# Patient Record
Sex: Male | Born: 1970 | Race: White | Hispanic: No | Marital: Married | Smoking: Former smoker
Health system: Southern US, Community
[De-identification: ages and names within clinical notes are randomized; demographics above are authoritative.]

## PROBLEM LIST (undated history)

## (undated) DIAGNOSIS — IMO0002 Reserved for concepts with insufficient information to code with codable children: Secondary | ICD-10-CM

## (undated) DIAGNOSIS — I5022 Chronic systolic (congestive) heart failure: Secondary | ICD-10-CM

## (undated) DIAGNOSIS — I10 Essential (primary) hypertension: Secondary | ICD-10-CM

## (undated) DIAGNOSIS — E118 Type 2 diabetes mellitus with unspecified complications: Secondary | ICD-10-CM

## (undated) DIAGNOSIS — I639 Cerebral infarction, unspecified: Secondary | ICD-10-CM

## (undated) DIAGNOSIS — M1990 Unspecified osteoarthritis, unspecified site: Secondary | ICD-10-CM

## (undated) DIAGNOSIS — I2510 Atherosclerotic heart disease of native coronary artery without angina pectoris: Secondary | ICD-10-CM

## (undated) DIAGNOSIS — Z951 Presence of aortocoronary bypass graft: Secondary | ICD-10-CM

## (undated) DIAGNOSIS — I255 Ischemic cardiomyopathy: Secondary | ICD-10-CM

## (undated) DIAGNOSIS — H5340 Unspecified visual field defects: Secondary | ICD-10-CM

## (undated) DIAGNOSIS — E1165 Type 2 diabetes mellitus with hyperglycemia: Secondary | ICD-10-CM

## (undated) DIAGNOSIS — I420 Dilated cardiomyopathy: Secondary | ICD-10-CM

## (undated) HISTORY — PX: CHOLECYSTECTOMY: SHX55

## (undated) HISTORY — DX: Chronic systolic (congestive) heart failure: I50.22

## (undated) HISTORY — DX: Unspecified osteoarthritis, unspecified site: M19.90

---

## 2003-12-17 ENCOUNTER — Ambulatory Visit (HOSPITAL_COMMUNITY): Admission: RE | Admit: 2003-12-17 | Discharge: 2003-12-17 | Payer: Self-pay | Admitting: Orthopedic Surgery

## 2013-01-08 DIAGNOSIS — I2510 Atherosclerotic heart disease of native coronary artery without angina pectoris: Secondary | ICD-10-CM

## 2013-01-08 HISTORY — PX: CORONARY ANGIOPLASTY: SHX604

## 2013-01-08 HISTORY — DX: Atherosclerotic heart disease of native coronary artery without angina pectoris: I25.10

## 2013-01-08 HISTORY — PX: CARDIAC CATHETERIZATION: SHX172

## 2013-04-03 DIAGNOSIS — I509 Heart failure, unspecified: Secondary | ICD-10-CM

## 2013-04-07 DIAGNOSIS — R079 Chest pain, unspecified: Secondary | ICD-10-CM

## 2014-08-25 ENCOUNTER — Telehealth: Payer: Self-pay | Admitting: Family Medicine

## 2014-08-25 NOTE — Telephone Encounter (Signed)
[facility]told to arrive 15 minutes early and bring all meds, has [employer] insurance

## 2014-10-10 ENCOUNTER — Encounter (HOSPITAL_COMMUNITY): Payer: Self-pay | Admitting: Cardiology

## 2014-10-10 ENCOUNTER — Inpatient Hospital Stay (HOSPITAL_COMMUNITY)
Admission: EM | Admit: 2014-10-10 | Discharge: 2014-10-26 | DRG: 233 | Disposition: A | Discharge status: Home / Self Care | Payer: 59 | Attending: Thoracic Surgery (Cardiothoracic Vascular Surgery) | Admitting: Thoracic Surgery (Cardiothoracic Vascular Surgery)

## 2014-10-10 ENCOUNTER — Encounter (HOSPITAL_COMMUNITY)
Admission: EM | Disposition: A | Discharge status: Home / Self Care | Payer: Self-pay | Source: Home / Self Care | Attending: Thoracic Surgery (Cardiothoracic Vascular Surgery)

## 2014-10-10 DIAGNOSIS — H5340 Unspecified visual field defects: Secondary | ICD-10-CM | POA: Diagnosis present

## 2014-10-10 DIAGNOSIS — R079 Chest pain, unspecified: Secondary | ICD-10-CM | POA: Diagnosis present

## 2014-10-10 DIAGNOSIS — Z87891 Personal history of nicotine dependence: Secondary | ICD-10-CM | POA: Diagnosis not present

## 2014-10-10 DIAGNOSIS — I10 Essential (primary) hypertension: Secondary | ICD-10-CM | POA: Diagnosis present

## 2014-10-10 DIAGNOSIS — N179 Acute kidney failure, unspecified: Secondary | ICD-10-CM | POA: Diagnosis not present

## 2014-10-10 DIAGNOSIS — K045 Chronic apical periodontitis: Secondary | ICD-10-CM | POA: Diagnosis present

## 2014-10-10 DIAGNOSIS — E1165 Type 2 diabetes mellitus with hyperglycemia: Secondary | ICD-10-CM | POA: Diagnosis present

## 2014-10-10 DIAGNOSIS — I493 Ventricular premature depolarization: Secondary | ICD-10-CM | POA: Diagnosis not present

## 2014-10-10 DIAGNOSIS — D62 Acute posthemorrhagic anemia: Secondary | ICD-10-CM | POA: Diagnosis not present

## 2014-10-10 DIAGNOSIS — D72829 Elevated white blood cell count, unspecified: Secondary | ICD-10-CM | POA: Diagnosis present

## 2014-10-10 DIAGNOSIS — F4323 Adjustment disorder with mixed anxiety and depressed mood: Secondary | ICD-10-CM

## 2014-10-10 DIAGNOSIS — M1990 Unspecified osteoarthritis, unspecified site: Secondary | ICD-10-CM | POA: Diagnosis present

## 2014-10-10 DIAGNOSIS — I639 Cerebral infarction, unspecified: Secondary | ICD-10-CM

## 2014-10-10 DIAGNOSIS — I252 Old myocardial infarction: Secondary | ICD-10-CM | POA: Diagnosis not present

## 2014-10-10 DIAGNOSIS — I4891 Unspecified atrial fibrillation: Secondary | ICD-10-CM | POA: Diagnosis not present

## 2014-10-10 DIAGNOSIS — Z7982 Long term (current) use of aspirin: Secondary | ICD-10-CM

## 2014-10-10 DIAGNOSIS — I255 Ischemic cardiomyopathy: Secondary | ICD-10-CM | POA: Diagnosis present

## 2014-10-10 DIAGNOSIS — Z794 Long term (current) use of insulin: Secondary | ICD-10-CM

## 2014-10-10 DIAGNOSIS — E785 Hyperlipidemia, unspecified: Secondary | ICD-10-CM | POA: Diagnosis present

## 2014-10-10 DIAGNOSIS — I4510 Unspecified right bundle-branch block: Secondary | ICD-10-CM | POA: Diagnosis present

## 2014-10-10 DIAGNOSIS — K029 Dental caries, unspecified: Secondary | ICD-10-CM | POA: Diagnosis present

## 2014-10-10 DIAGNOSIS — I2109 ST elevation (STEMI) myocardial infarction involving other coronary artery of anterior wall: Principal | ICD-10-CM | POA: Diagnosis present

## 2014-10-10 DIAGNOSIS — Z609 Problem related to social environment, unspecified: Secondary | ICD-10-CM | POA: Diagnosis present

## 2014-10-10 DIAGNOSIS — K219 Gastro-esophageal reflux disease without esophagitis: Secondary | ICD-10-CM | POA: Diagnosis present

## 2014-10-10 DIAGNOSIS — I951 Orthostatic hypotension: Secondary | ICD-10-CM | POA: Diagnosis not present

## 2014-10-10 DIAGNOSIS — F329 Major depressive disorder, single episode, unspecified: Secondary | ICD-10-CM | POA: Insufficient documentation

## 2014-10-10 DIAGNOSIS — I420 Dilated cardiomyopathy: Secondary | ICD-10-CM | POA: Diagnosis present

## 2014-10-10 DIAGNOSIS — I2510 Atherosclerotic heart disease of native coronary artery without angina pectoris: Secondary | ICD-10-CM | POA: Diagnosis present

## 2014-10-10 DIAGNOSIS — Z6841 Body Mass Index (BMI) 40.0 and over, adult: Secondary | ICD-10-CM | POA: Diagnosis not present

## 2014-10-10 DIAGNOSIS — I5043 Acute on chronic combined systolic (congestive) and diastolic (congestive) heart failure: Secondary | ICD-10-CM | POA: Diagnosis present

## 2014-10-10 DIAGNOSIS — K0530 Chronic periodontitis, unspecified: Secondary | ICD-10-CM | POA: Diagnosis present

## 2014-10-10 DIAGNOSIS — K083 Retained dental root: Secondary | ICD-10-CM | POA: Diagnosis present

## 2014-10-10 DIAGNOSIS — K011 Impacted teeth: Secondary | ICD-10-CM | POA: Diagnosis present

## 2014-10-10 DIAGNOSIS — E118 Type 2 diabetes mellitus with unspecified complications: Secondary | ICD-10-CM

## 2014-10-10 DIAGNOSIS — IMO0002 Reserved for concepts with insufficient information to code with codable children: Secondary | ICD-10-CM | POA: Diagnosis present

## 2014-10-10 DIAGNOSIS — I2582 Chronic total occlusion of coronary artery: Secondary | ICD-10-CM | POA: Diagnosis present

## 2014-10-10 DIAGNOSIS — I509 Heart failure, unspecified: Secondary | ICD-10-CM

## 2014-10-10 DIAGNOSIS — E1159 Type 2 diabetes mellitus with other circulatory complications: Secondary | ICD-10-CM | POA: Diagnosis present

## 2014-10-10 DIAGNOSIS — Z8673 Personal history of transient ischemic attack (TIA), and cerebral infarction without residual deficits: Secondary | ICD-10-CM | POA: Diagnosis not present

## 2014-10-10 DIAGNOSIS — I152 Hypertension secondary to endocrine disorders: Secondary | ICD-10-CM | POA: Diagnosis present

## 2014-10-10 DIAGNOSIS — I213 ST elevation (STEMI) myocardial infarction of unspecified site: Secondary | ICD-10-CM

## 2014-10-10 DIAGNOSIS — I2102 ST elevation (STEMI) myocardial infarction involving left anterior descending coronary artery: Secondary | ICD-10-CM | POA: Diagnosis present

## 2014-10-10 DIAGNOSIS — J9811 Atelectasis: Secondary | ICD-10-CM

## 2014-10-10 DIAGNOSIS — F4321 Adjustment disorder with depressed mood: Secondary | ICD-10-CM | POA: Diagnosis present

## 2014-10-10 DIAGNOSIS — Z9289 Personal history of other medical treatment: Secondary | ICD-10-CM

## 2014-10-10 DIAGNOSIS — I472 Ventricular tachycardia: Secondary | ICD-10-CM | POA: Diagnosis not present

## 2014-10-10 DIAGNOSIS — Z951 Presence of aortocoronary bypass graft: Secondary | ICD-10-CM

## 2014-10-10 HISTORY — DX: Presence of aortocoronary bypass graft: Z95.1

## 2014-10-10 HISTORY — DX: Essential (primary) hypertension: I10

## 2014-10-10 HISTORY — DX: Cerebral infarction, unspecified: I63.9

## 2014-10-10 HISTORY — PX: LEFT HEART CATH: SHX5478

## 2014-10-10 HISTORY — DX: Atherosclerotic heart disease of native coronary artery without angina pectoris: I25.10

## 2014-10-10 HISTORY — DX: Reserved for concepts with insufficient information to code with codable children: IMO0002

## 2014-10-10 HISTORY — DX: Ischemic cardiomyopathy: I25.5

## 2014-10-10 HISTORY — DX: Dilated cardiomyopathy: I42.0

## 2014-10-10 HISTORY — DX: Type 2 diabetes mellitus with unspecified complications: E11.8

## 2014-10-10 HISTORY — DX: Morbid (severe) obesity due to excess calories: E66.01

## 2014-10-10 HISTORY — DX: Type 2 diabetes mellitus with hyperglycemia: E11.65

## 2014-10-10 HISTORY — DX: Unspecified visual field defects: H53.40

## 2014-10-10 SURGERY — LEFT HEART CATH
Anesthesia: LOCAL

## 2014-10-10 MED ORDER — HEPARIN (PORCINE) IN NACL 2-0.9 UNIT/ML-% IJ SOLN
INTRAMUSCULAR | Status: AC
  Filled 2014-10-10: qty 1000

## 2014-10-10 MED ORDER — LIDOCAINE HCL (PF) 1 % IJ SOLN
INTRAMUSCULAR | Status: AC
  Filled 2014-10-10: qty 30

## 2014-10-10 MED ORDER — TIROFIBAN HCL IV 12.5 MG/250 ML
INTRAVENOUS | Status: AC
  Filled 2014-10-10: qty 250

## 2014-10-10 MED ORDER — NITROGLYCERIN 1 MG/10 ML FOR IR/CATH LAB
INTRA_ARTERIAL | Status: AC
  Filled 2014-10-10: qty 10

## 2014-10-10 MED ORDER — NITROGLYCERIN IN D5W 200-5 MCG/ML-% IV SOLN
INTRAVENOUS | Status: AC
  Filled 2014-10-10: qty 250

## 2014-10-10 MED ORDER — FENTANYL CITRATE 0.05 MG/ML IJ SOLN
INTRAMUSCULAR | Status: AC
  Filled 2014-10-10: qty 2

## 2014-10-10 MED ORDER — INSULIN ASPART 100 UNIT/ML ~~LOC~~ SOLN
0.0000 [IU] | Freq: Three times a day (TID) | SUBCUTANEOUS | Status: DC
  Administered 2014-10-11: 4 [IU] via SUBCUTANEOUS
  Administered 2014-10-11: 11 [IU] via SUBCUTANEOUS
  Administered 2014-10-11: 7 [IU] via SUBCUTANEOUS
  Administered 2014-10-12: 11 [IU] via SUBCUTANEOUS
  Administered 2014-10-12: 4 [IU] via SUBCUTANEOUS
  Administered 2014-10-12: 7 [IU] via SUBCUTANEOUS
  Filled 2014-10-10: qty 0.2

## 2014-10-10 MED ORDER — MIDAZOLAM HCL 2 MG/2ML IJ SOLN
INTRAMUSCULAR | Status: AC
  Filled 2014-10-10: qty 2

## 2014-10-10 NOTE — H&P (Addendum)
Admit date: 10/10/2014 Referring Physician: Dr. Rachel Bo Primary Cardiologist: [facility] - has not seen anyone since MI 4/14 Chief complaint/reason for admission:Chest pain  HPI: This is a 44yo morbidly obese WM with a history of AWMI 4/14 with cath a [facility] showing 95% prox LAD, 90% D1, 30% OM1, 70% mid RCA, 60% distal RCA and EF 40%.  He has not seen a Cardiologist since then.  He has a history of Type II DM as well.  He is not on a statin.  He was in his USOH until 3 days ago when he started having severe chest burning but it would only occur at night and mainly when lying down and he would get very SOB.  He also was having some problems with cough.  He denies any fever but had some subjective chills.  He tried some alka seltzer with minimal relief.  He says that he is unable to lay down to go to sleep due to the burning sensation.  He just finished antibiotics for a sinus infection.  He presented to [facility] ER today after finishing dinner when he had reoccurence of the chest discomfort.  He has a history of GERD.  He says that his symptoms are different from what he can recall from his prior MI.  In ER he was noted to have an old anterior MI but new ST elevation in V1 and V2 more pronounced than the minimal ST elevation he had on prior EKG.  A code STEMI was called and he now presents to [facility] cath lab with 3/10 chest discomfort.    PMH:    Past Medical History  Diagnosis Date  . Coronary artery disease 01/2013    anterior MI with cath showing 95% pLAD, 90% diag, 30% OM1, 70% mRCA, 60% dRCA s/p PCI with DES to LAD and diag and EF 40%  . Ischemic dilated cardiomyopathy   . Hypertension   . Morbid obesity   . Diabetes mellitus without complication     PSH:    Past Surgical History  Procedure Laterality Date  . Cardiac catheterization    . Coronary angioplasty    . Cholecystectomy      ALLERGIES:   Review of patient's allergies indicates not on file.  Prior to Admit Meds:   Prescriptions prior to  admission  Medication Sig Dispense Refill Last Dose  . aspirin 81 MG tablet Take 81 mg by mouth daily.     Marland Kitchen lisinopril-hydrochlorothiazide (PRINZIDE,ZESTORETIC) 10-12.5 MG per tablet Take 1 tablet by mouth daily.     . metFORMIN (GLUCOPHAGE) 500 MG tablet Take 500 mg by mouth 2 (two) times daily with a meal.      Family HX:   No family history on file. Social HX:    History   Social History  . Marital Status: Married    Spouse Name: N/A    Number of Children: N/A  . Years of Education: N/A   Occupational History  . Not on file.   Social History Main Topics  . Smoking status: Not on file  . Smokeless tobacco: Not on file  . Alcohol Use: Not on file  . Drug Use: Not on file  . Sexual Activity: Not on file   Other Topics Concern  . Not on file   Social History Narrative  . No narrative on file     ROS:  All 11 ROS were addressed and are negative except what is stated in the HPI  PHYSICAL EXAM There were  no vitals filed for this visit. General: Well developed, well nourished, in no acute distress Head: Eyes PERRLA, No xanthomas.   Normal cephalic and atramatic  Lungs:   Clear bilaterally to auscultation and percussion. Heart:   HRRR S1 S2 Pulses are 2+ & equal.            No carotid bruit. No JVD.  No abdominal bruits. No femoral bruits. Abdomen: Bowel sounds are positive, abdomen soft and non-tender without masses Extremities:   No clubbing, cyanosis or edema.  DP +1 Neuro: Alert and oriented X 3. Psych:  Good affect, responds appropriately   Labs:  No results found for: WBC, HGB, HCT, MCV, PLT No results for input(s): NA, K, CL, CO2, BUN, CREATININE, CALCIUM, PROT, BILITOT, ALKPHOS, ALT, AST, GLUCOSE in the last 168 hours.  Invalid input(s): LABALBU No results found for: CKTOTAL, CKMB, CKMBINDEX, TROPONINI No results found for: PTT No results found for: INR, PROTIME  No results found for: CHOL No results found for: HDL No results found for: LDLCALC No results  found for: TRIG No results found for: CHOLHDL No results found for: LDLDIRECT    Radiology:  No results found.  EKG:  NSR with anterior infarct and 2mm of ST elevation in V1 and V2  ASSESSMENT:  1.  Anterior STEMI with 2mm of ST segment elevation in V1 and V2.  CP symptoms are somewhat atypical but pain has been going on for 3 days and currently having pain with new EKG changes.  Troponin elevated at 3.5. 2.  ASCAD with remote anterior MI a year ago with cath at that time showing 95% pLAD, 90% D1, 30% OM1, 70% mRCA and 60% dRCA with EF 40% s/p PCI of LAD and diag with DES. 3.  Morbid obesity 4.  Type II DM  PLAN:   1.  Admit to CCU 2.  Cycle cardiac enzymes 3.  IV Heparin gtt 4.  Hold Metformin 5.  SS Insulin coverage 6.  Check FLP in am and LFTs 7.  Start high dose statin therapy Lipitor  daily 8.  Continue ASA 9.  Continue Lisinopril at  daily and hold HCTZ 10.  Start Lopressor 12.5mg  BID and titrate as BP allows  Quintella Reichert, MD  10/10/2014  11:22 PM

## 2014-10-10 NOTE — CV Procedure (Signed)
Mark Leach is a 44 y.o. male    [id number]  [id number] LOCATION:  FACILITY: [facility]  PHYSICIAN: Lennette Bihari, MD, [facility] 1971/07/29   DATE OF PROCEDURE:  10/10/2014    EMERGENT CARDIAC CATHETERIZATION     HISTORY:    Mark Leach is a 44 y.o. male who has a history of prior anterior wall myocardial infarction and had undergone stenting to his LAD diagonal system at [facility] in April 2014.  He has not had Cardiologic follow-up.  He presented to [facility] hospital this evening after 3 days of progressive chest burning and inability to lie flat with dyspnea.  His ECG showed Q waves anteriorly, but had more progressive ST elevation.  A code STEMI was called and he was transported to [facility] for urgent cardiac catheterization.   PROCEDURE: Left heart catheterization: coronary angiography, left ventriculography   The patient was brought to the [facility] cardiac catherization laboratory in transfer from [facility]. Upon arrival to the catheterization laboratory he had residual 3 had a 10 chest burning.  His right groin was prepped and draped in sterile fashion.  His right femoral artery was punctured without difficulty with 1 anterior stick.  A 6 French arterial sheath was inserted.  Diagnostic catheterization was done with 5 French Judkins 5 left and JR4 right coronary catheters.  A 5 French pigtail catheter was used for left ventriculography.  In light of the patient's severe coronary anatomy, Aggrastat was started.  At completion of the procedure, the patient's chest burning had completely resolved.  Hemostasis  was obtained by direct manual pressure.  HEMODYNAMICS:   Central Aorta: 110/70   Left Ventricle: 110/12/22  ANGIOGRAPHY:  Left main: Moderate size vessel which trifurcated into an LAD and intermediate vessel and left circumflex coronary artery.  LAD: The LAD was subtotally occluded at its ostium and there was diffuse 95-99% ostial proximal stenosis and  then was totally occluded in the region of the first diagonal branch.  There was a gap and then  faint filling of a second diagonal branch which had a stent and there was faint filling of the LAD beyond the diagonal vessel via collaterals.  Ramus Intermediate: Small caliber vessel free of significant disease.  Left circumflex: Large caliber vessel that gave rise to 2 major marginal branches and then in the posterior lateral like coronary artery.  The first marginal branch was moderate size and had proximal 90% followed by 80% stenoses.  A distal superior branch had 95% stenosis.  Right coronary artery: Moderate size vessel that had 95% mid stenosis and 80% distal stenosis in the region of the acute margin.  The vessel supplied the PDA.  There were septal collaterals to the LAD.   Left ventriculography revealed dilated ventricle with an ejection fraction of 25% with diffuse global hypokinesis   IMPRESSION:  Severe ischemic dilated cardiomyopathy with an ejection fraction approximately 25%.  Severe multivessel coronary obstructive disease with diffusely diseased subtotal occlusion of the ostial and proximal LAD with faint collaterals to the first and second diagonal vessel and more distal LAD; left circumflex coronary artery with tandem 90 and 80% obtuse marginal 1 stenosis with distal 95% stenosis in this distal superior branch of this marginal vessel; and 95% mid RCA stenosis with 80% stenosis in the region of the acute margin with evidence for septal collaterals from the PDA to the LAD.  RECOMMENDATION:  The patient has surgical anatomy and CABG revascularization surgery will be recommended with surgical consultation in the morning.  The patient was started on Aggrastat post procedure to reduce likelihood of progressive thrombosis.  An attempt was made to initiate low-dose IV nitroglycerin, but the patient transiently dropped his blood pressure and this was discontinued.    Lennette Bihari,  MD, [facility] 10/10/2014 11:43 PM

## 2014-10-11 ENCOUNTER — Encounter (HOSPITAL_COMMUNITY): Payer: Self-pay | Admitting: *Deleted

## 2014-10-11 DIAGNOSIS — I471 Supraventricular tachycardia: Secondary | ICD-10-CM

## 2014-10-11 DIAGNOSIS — I2102 ST elevation (STEMI) myocardial infarction involving left anterior descending coronary artery: Secondary | ICD-10-CM

## 2014-10-11 DIAGNOSIS — I255 Ischemic cardiomyopathy: Secondary | ICD-10-CM

## 2014-10-11 LAB — GLUCOSE, CAPILLARY
Glucose-Capillary: 232 mg/dL — ABNORMAL HIGH (ref 70–99)
Glucose-Capillary: 254 mg/dL — ABNORMAL HIGH (ref 70–99)
Glucose-Capillary: 171 mg/dL — ABNORMAL HIGH (ref 70–99)
Glucose-Capillary: 164 mg/dL — ABNORMAL HIGH (ref 70–99)

## 2014-10-11 LAB — TSH: TSH: 3.047 u[IU]/mL (ref 0.350–4.500)

## 2014-10-11 LAB — CBC WITH DIFFERENTIAL/PLATELET
BASOS PCT: 0 % (ref 0–1)
Basophils Absolute: 0 10*3/uL (ref 0.0–0.1)
EOS PCT: 1 % (ref 0–5)
Eosinophils Absolute: 0.2 10*3/uL (ref 0.0–0.7)
HCT: 41.6 % (ref 39.0–52.0)
Hemoglobin: 13.9 g/dL (ref 13.0–17.0)
LYMPHS PCT: 11 % — AB (ref 12–46)
Lymphs Abs: 1.4 10*3/uL (ref 0.7–4.0)
MCH: 29.5 pg (ref 26.0–34.0)
MCHC: 33.4 g/dL (ref 30.0–36.0)
MCV: 88.3 fL (ref 78.0–100.0)
Monocytes Absolute: 0.6 10*3/uL (ref 0.1–1.0)
Monocytes Relative: 5 % (ref 3–12)
NEUTROS ABS: 9.8 10*3/uL — AB (ref 1.7–7.7)
Neutrophils Relative %: 83 % — ABNORMAL HIGH (ref 43–77)
Platelets: 186 10*3/uL (ref 150–400)
RBC: 4.71 MIL/uL (ref 4.22–5.81)
RDW: 13 % (ref 11.5–15.5)
WBC: 12 10*3/uL — ABNORMAL HIGH (ref 4.0–10.5)

## 2014-10-11 LAB — COMPREHENSIVE METABOLIC PANEL
ALBUMIN: 3.1 g/dL — AB (ref 3.5–5.2)
ALK PHOS: 147 U/L — AB (ref 39–117)
ALT: 53 U/L (ref 0–53)
ANION GAP: 7 (ref 5–15)
AST: 38 U/L — AB (ref 0–37)
BUN: 15 mg/dL (ref 6–23)
CHLORIDE: 102 meq/L (ref 96–112)
CO2: 28 mmol/L (ref 19–32)
CREATININE: 1.32 mg/dL (ref 0.50–1.35)
Calcium: 8.2 mg/dL — ABNORMAL LOW (ref 8.4–10.5)
GFR calc non Af Amer: 65 mL/min — ABNORMAL LOW (ref >90)
GFR, EST AFRICAN AMERICAN: 76 mL/min — AB (ref >90)
Glucose, Bld: 345 mg/dL — ABNORMAL HIGH (ref 70–99)
Potassium: 4.5 mmol/L (ref 3.5–5.1)
SODIUM: 137 mmol/L (ref 135–145)
Total Bilirubin: 0.7 mg/dL (ref 0.3–1.2)
Total Protein: 6.1 g/dL (ref 6.0–8.3)

## 2014-10-11 LAB — CBC
HCT: 39.2 % (ref 39.0–52.0)
HEMOGLOBIN: 13.2 g/dL (ref 13.0–17.0)
MCH: 29.5 pg (ref 26.0–34.0)
MCHC: 33.7 g/dL (ref 30.0–36.0)
MCV: 87.5 fL (ref 78.0–100.0)
Platelets: 168 10*3/uL (ref 150–400)
RBC: 4.48 MIL/uL (ref 4.22–5.81)
RDW: 13 % (ref 11.5–15.5)
WBC: 10.2 10*3/uL (ref 4.0–10.5)

## 2014-10-11 LAB — MRSA PCR SCREENING: MRSA BY PCR: NEGATIVE

## 2014-10-11 LAB — TROPONIN I
TROPONIN I: 5.08 ng/mL — AB (ref <0.031)
TROPONIN I: 4.92 ng/mL — AB (ref <0.031)
Troponin I: 4 ng/mL (ref <0.031)

## 2014-10-11 LAB — MAGNESIUM: Magnesium: 1.6 mg/dL (ref 1.5–2.5)

## 2014-10-11 LAB — PROTIME-INR
INR: 1.01 (ref 0.00–1.49)
Prothrombin Time: 13.4 seconds (ref 11.6–15.2)

## 2014-10-11 LAB — HEPARIN LEVEL (UNFRACTIONATED): Heparin Unfractionated: 0.1 IU/mL — ABNORMAL LOW (ref 0.30–0.70)

## 2014-10-11 LAB — APTT: aPTT: 36 seconds (ref 24–37)

## 2014-10-11 LAB — BRAIN NATRIURETIC PEPTIDE: B Natriuretic Peptide: 312.8 pg/mL — ABNORMAL HIGH (ref 0.0–100.0)

## 2014-10-11 MED ORDER — ASPIRIN 300 MG RE SUPP
300.0000 mg | RECTAL | Status: AC
  Filled 2014-10-11: qty 1

## 2014-10-11 MED ORDER — ASPIRIN 81 MG PO TABS: 81.0000 mg | ORAL_TABLET | Freq: Every day | ORAL | Status: DC

## 2014-10-11 MED ORDER — ASPIRIN EC 81 MG PO TBEC: 81.0000 mg | DELAYED_RELEASE_TABLET | Freq: Every day | ORAL | Status: DC

## 2014-10-11 MED ORDER — ACETAMINOPHEN 325 MG PO TABS
650.0000 mg | ORAL_TABLET | ORAL | Status: DC | PRN
  Filled 2014-10-11: qty 2

## 2014-10-11 MED ORDER — ACETAMINOPHEN 325 MG PO TABS: 650.0000 mg | ORAL_TABLET | ORAL | Status: DC | PRN

## 2014-10-11 MED ORDER — METOPROLOL TARTRATE 12.5 MG HALF TABLET
12.5000 mg | ORAL_TABLET | Freq: Two times a day (BID) | ORAL | Status: DC
  Filled 2014-10-11 (×3): qty 1

## 2014-10-11 MED ORDER — ATORVASTATIN CALCIUM 80 MG PO TABS
80.0000 mg | ORAL_TABLET | Freq: Every day | ORAL | Status: DC
  Administered 2014-10-11 – 2014-10-12 (×2): 80 mg via ORAL
  Filled 2014-10-11 (×3): qty 1

## 2014-10-11 MED ORDER — DOPAMINE-DEXTROSE 3.2-5 MG/ML-% IV SOLN: 5.0000 ug/kg/min | INTRAVENOUS | Status: DC

## 2014-10-11 MED ORDER — TIROFIBAN HCL IV 5 MG/100ML
0.1500 ug/kg/min | INTRAVENOUS | Status: AC
  Administered 2014-10-11 – 2014-10-12 (×9): 0.15 ug/kg/min via INTRAVENOUS
  Filled 2014-10-11 (×21): qty 100

## 2014-10-11 MED ORDER — ASPIRIN EC 81 MG PO TBEC
81.0000 mg | DELAYED_RELEASE_TABLET | Freq: Every day | ORAL | Status: DC
  Administered 2014-10-11 – 2014-10-12 (×2): 81 mg via ORAL
  Filled 2014-10-11 (×3): qty 1

## 2014-10-11 MED ORDER — ASPIRIN 81 MG PO CHEW: 324.0000 mg | CHEWABLE_TABLET | ORAL | Status: AC

## 2014-10-11 MED ORDER — ONDANSETRON HCL 4 MG/2ML IJ SOLN: 4.0000 mg | Freq: Four times a day (QID) | INTRAMUSCULAR | Status: DC | PRN

## 2014-10-11 MED ORDER — ONDANSETRON HCL 4 MG/2ML IJ SOLN
INTRAMUSCULAR | Status: AC
  Filled 2014-10-11: qty 2

## 2014-10-11 MED ORDER — NITROGLYCERIN 0.4 MG SL SUBL: 0.4000 mg | SUBLINGUAL_TABLET | SUBLINGUAL | Status: DC | PRN

## 2014-10-11 MED ORDER — METOPROLOL TARTRATE 25 MG PO TABS
25.0000 mg | ORAL_TABLET | Freq: Three times a day (TID) | ORAL | Status: DC
Start: 2014-10-11 — End: 2014-10-12
  Administered 2014-10-11 – 2014-10-12 (×3): 25 mg via ORAL
  Filled 2014-10-11 (×5): qty 1

## 2014-10-11 MED ORDER — SODIUM CHLORIDE 0.9 % IV SOLN
INTRAVENOUS | Status: DC
  Administered 2014-10-11: 20:00:00 via INTRAVENOUS

## 2014-10-11 MED ORDER — LISINOPRIL 5 MG PO TABS
5.0000 mg | ORAL_TABLET | Freq: Every day | ORAL | Status: DC
  Administered 2014-10-11 – 2014-10-12 (×2): 5 mg via ORAL
  Filled 2014-10-11 (×3): qty 1

## 2014-10-11 MED ORDER — ATORVASTATIN CALCIUM 80 MG PO TABS: 80.0000 mg | ORAL_TABLET | Freq: Every day | ORAL | Status: DC

## 2014-10-11 MED ORDER — HEPARIN (PORCINE) IN NACL 100-0.45 UNIT/ML-% IJ SOLN
2450.0000 [IU]/h | INTRAMUSCULAR | Status: DC
  Administered 2014-10-11: 1800 [IU]/h via INTRAVENOUS
  Administered 2014-10-11: 2100 [IU]/h via INTRAVENOUS
  Administered 2014-10-12: 2450 [IU]/h via INTRAVENOUS
  Filled 2014-10-11 (×9): qty 250

## 2014-10-11 NOTE — Progress Notes (Signed)
2 loose teeth(r lower) noted w/ poor dentition,pt denied any toothache and hadnt seen dentist in awhile.

## 2014-10-11 NOTE — Progress Notes (Signed)
ANTICOAGULATION CONSULT NOTE - Follow Up Consult  Pharmacy Consult for heparin Indication: CAD awaiting TCTS consult  Allergies  Allergen Reactions  . Nitroglycerin Other (See Comments)    hypotension    Patient Measurements: Height:  (180.3 cm) Weight: (!) 331 lb 9.2 oz (150.4 kg) IBW/kg (Calculated) : 75.3 Heparin Dosing Weight: 111 kg  Vital Signs: Temp: 98.2 F (36.8 C) (01/02 1700) Temp Source: Oral (01/02 1700) BP: 143/84 mmHg (01/02 1800) Pulse Rate: 100 (01/02 1800)  Labs:  Recent Labs  10/11/14 0040 10/11/14 0630 10/11/14 1150 10/11/14 1650  HGB 13.9 13.2  --   --   HCT 41.6 39.2  --   --   PLT 186 168  --   --   APTT 36  --   --   --   LABPROT 13.4  --   --   --   INR 1.01  --   --   --   HEPARINUNFRC  --   --   --  0.10*  CREATININE 1.32  --   --   --   TROPONINI 4.00* 5.08* 4.92*  --     Estimated Creatinine Clearance: 108.6 mL/min (by C-G formula based on Cr of 1.32).   Medications:  Infusions:  . sodium chloride 100 mL/hr at 10/11/14 0100  . DOPamine Stopped (10/11/14 0200)  . heparin 1,800 Units/hr (10/11/14 0945)  . tirofiban 0.15 mcg/kg/min (10/11/14 1600)    Assessment: 44 y/o obese male who admitted with chest pain and taken to the cath lab. Patient continues on IV heparin for CAD and is awaiting TCTS consult. Heparin level is SUBtherapeutic at 0.1 on 1800 units/hr. No bleeding noted.  Goal of Therapy:  Heparin level 0.3-0.5 units/ml (while on 2b3a inhibitor) Monitor platelets by anticoagulation protocol: Yes   Plan:  - Increase heparin drip to 2100 units/hr - 6 hr heparin level - Daily heparin level and CBC - Monitor for s/sx of bleeding  [facility], [place].D., BCPS Clinical Pharmacist Pager: [phone] 10/11/2014 6:13 PM

## 2014-10-11 NOTE — Progress Notes (Signed)
[id number] Discussed with patient and wife importance of mobility and IS after surgery. Discussed sternal precautions and demonstrated how we rock and assist to stand since pt stated his knees get weak if arthritis flares. Gave OHS booklet, care guide and wrote down how to view pre op video when they are ready.  Wife stated would be able to stay with pt after discharge first week. Since pt has not been seen by surgeon, did not walk. Will follow up Monday. Luetta Nutting RN BSN 10/11/2014 11:43 AM

## 2014-10-11 NOTE — Progress Notes (Signed)
Patient Name: Mark Leach      SUBJECTIVE: asdmitted 1/1 with Chest Pain>>    CATH LAD: The LAD was subtotally occluded at its ostium and there was diffuse 95-99% ostial proximal stenosis and then was totally occluded in the region of the first diagonal branch. There was a gap and then faint filling of a second diagonal branch which had a stent and there was faint filling of the LAD beyond the diagonal vessel via collaterals.  Ramus Intermediate: Small caliber vessel free of significant disease.  Left circumflex: Large caliber vessel that gave rise to 2 major marginal branches and then in the posterior lateral like coronary artery. The first marginal branch was moderate size and had proximal 90% followed by 80% stenoses. A distal superior branch had 95% stenosis.  Right coronary artery: Moderate size vessel that had 95% mid stenosis and 80% distal stenosis in the region of the acute margin. The vessel supplied the PDA. There were septal collaterals to the LAD  EF 25%  Surgery consult anticipated today   Denies chest pain at this point breathing is stable   Has hx of AWMI 4/14 [facility] without cardiology followup  CATH showing 95% prox LAD, 90% D1, 30% OM1, 70% mid RCA, 60% distal RCA and EF 40%. Has DM morbid obesity   Past Medical History  Diagnosis Date  . Coronary artery disease 01/2013    anterior MI with cath showing 95% pLAD, 90% diag, 30% OM1, 70% mRCA, 60% dRCA s/p PCI with DES to LAD and diag and EF 40%  . Ischemic dilated cardiomyopathy   . Hypertension   . Morbid obesity   . Diabetes mellitus without complication     Scheduled Meds:  Scheduled Meds: . aspirin EC  81 mg Oral Daily  . atorvastatin  80 mg Oral q1800  . insulin aspart  0-20 Units Subcutaneous TID WC  . lisinopril  5 mg Oral Daily  . metoprolol tartrate  12.5 mg Oral BID   Continuous Infusions: . sodium chloride 100 mL/hr at 10/11/14 0100  . DOPamine Stopped (10/11/14 0200)  .  heparin    . tirofiban 0.15 mcg/kg/min (10/11/14 0626)   acetaminophen, nitroGLYCERIN, ondansetron (ZOFRAN) IV    PHYSICAL EXAM Filed Vitals:   10/11/14 0500 10/11/14 0600 10/11/14 0700 10/11/14 0724  BP: 125/83 134/88 133/85   Pulse: 94 93 96 105  Temp:    97.7 F (36.5 C)  TempSrc:    Oral  Resp: Height:      Weight:      SpO2: 98% 98% 98% 98%    Well developed morbidly obese  in no acute distress HENT normal Neck supple   Clear Regular rate and rhythm, no murmurs or gallops Abd-soft with active BS No Clubbing cyanosis edema Skin-warm and dry A & Oriented  Grossly normal sensory and motor function Affect depressd TELEMETRY: Reviewed telemetry pt in sinus tach    Intake/Output Summary (Last 24 hours) at 10/11/14 0927 Last data filed at 10/11/14 0700  Gross per 24 hour  Intake 1134.4 ml  Output    500 ml  Net  634.4 ml    LABS: Basic Metabolic Panel:  Recent Labs Lab 10/11/14 0040  NA 137  K 4.5  CL 102  CO2 28  GLUCOSE 345*  BUN 15  CREATININE 1.32  CALCIUM 8.2*  MG 1.6   Cardiac Enzymes:  Recent Labs  10/11/14 0040 10/11/14 0630  TROPONINI 4.00*  5.08*   CBC:  Recent Labs Lab 10/11/14 0040 10/11/14 0630  WBC 12.0* 10.2  NEUTROABS 9.8*  --   HGB 13.9 13.2  HCT 41.6 39.2  MCV 88.3 87.5  PLT 186 168   PROTIME:  Recent Labs  10/11/14 0040  LABPROT 13.4  INR 1.01   Liver Function Tests:  Recent Labs  10/11/14 0040  AST 38*  ALT 53  ALKPHOS 147*  BILITOT 0.7  PROT 6.1  ALBUMIN 3.1*   No results for input(s): LIPASE, AMYLASE in the last 72 hours. BNP: BNP (last 3 results) No results for input(s): PROBNP in the last 8760 hours. D-Dimer: No results for input(s): DDIMER in the last 72 hours. Hemoglobin A1C: No results for input(s): HGBA1C in the last 72 hours. Fasting Lipid Panel: No results for input(s): CHOL, HDL, LDLCALC, TRIG, CHOLHDL, LDLDIRECT in the last 72 hours. Thyroid Function Tests:  Recent  Labs  10/11/14 0040  TSH 3.047      ASSESSMENT AND PLAN:  Active Problems:   ST elevation myocardial infarction (STEMI) involving left anterior descending (LAD) coronary artery with complication   Coronary artery disease   Ischemic dilated cardiomyopathy   Hypertension   Morbid obesity   Diabetes mellitus without complication  Continue supportive care at this point Await CVTS consult Augment BB for hypertension and tachycardia  Signed, Sherryl Manges MD  10/11/2014

## 2014-10-11 NOTE — Progress Notes (Signed)
EKG CRITICAL VALUE     12 lead EKG performed.  Critical value noted.  Fritzi Mandes, RN notified.   Donia Pounds, CCT 10/11/2014 12:06 PM

## 2014-10-11 NOTE — Progress Notes (Addendum)
ANTICOAGULATION CONSULT NOTE - Initial Consult  Pharmacy Consult for Aggrastat and heparin  Indication: post pci awaiting CVTS consult  Not on File  Patient Measurements:   Heparin Dosing Weight:   Vital Signs: Pulse Rate: 114 (01/01 2258)  Labs: No results for input(s): HGB, HCT, PLT, APTT, LABPROT, INR, HEPARINUNFRC, CREATININE, CKTOTAL, CKMB, TROPONINI in the last 72 hours.  CrCl cannot be calculated (Unknown ideal weight.).   Medical History: Past Medical History  Diagnosis Date  . Coronary artery disease 01/2013    anterior MI with cath showing 95% pLAD, 90% diag, 30% OM1, 70% mRCA, 60% dRCA s/p PCI with DES to LAD and diag and EF 40%  . Ischemic dilated cardiomyopathy   . Hypertension   . Morbid obesity   . Diabetes mellitus without complication     Medications:  Prescriptions prior to admission  Medication Sig Dispense Refill Last Dose  . aspirin 81 MG tablet Take 81 mg by mouth daily.     Marland Kitchen lisinopril-hydrochlorothiazide (PRINZIDE,ZESTORETIC) 10-12.5 MG per tablet Take 1 tablet by mouth daily.     . metFORMIN (GLUCOPHAGE) 500 MG tablet Take 500 mg by mouth 2 (two) times daily with a meal.       Assessment: 44 yo male with previous stenting in 01/2013 at [facility] with no follow-up. Presented to [facility] with 3 days of increasing cp and. Code STEMI called and brough to [facility] for urgent cath.  Due to anatomy and severe multi-vessel disease and EF of 25 % due to dilated CM pt is candidate for bypass. Pending consult. aggrastat to continue and heparin to start 8 hours after sheath removal.   Goal of Therapy:  Heparin level 0.3-0.7 units/ml Monitor platelets by anticoagulation protocol: Yes   Plan:  Continue aggrastat at 0.15 mcg/kg/min until d/c by cards.  Heparin to begin at 1800 units/hr 8 hours after sheath pulled. RN to notify when sheath pulled.  HL 8 hours after start of infusion.  Cbc with am labs starting this am.    Janice Coffin 10/11/2014,12:32 AM  12:49 AM  Sheath pulled in cath lab. Heparin to begin at 0900

## 2014-10-12 ENCOUNTER — Encounter (HOSPITAL_COMMUNITY): Payer: Self-pay | Admitting: Thoracic Surgery (Cardiothoracic Vascular Surgery)

## 2014-10-12 ENCOUNTER — Inpatient Hospital Stay (HOSPITAL_COMMUNITY): Payer: 59

## 2014-10-12 DIAGNOSIS — I319 Disease of pericardium, unspecified: Secondary | ICD-10-CM

## 2014-10-12 DIAGNOSIS — I10 Essential (primary) hypertension: Secondary | ICD-10-CM

## 2014-10-12 DIAGNOSIS — H5340 Unspecified visual field defects: Secondary | ICD-10-CM

## 2014-10-12 DIAGNOSIS — I5021 Acute systolic (congestive) heart failure: Secondary | ICD-10-CM

## 2014-10-12 DIAGNOSIS — I639 Cerebral infarction, unspecified: Secondary | ICD-10-CM

## 2014-10-12 DIAGNOSIS — I509 Heart failure, unspecified: Secondary | ICD-10-CM

## 2014-10-12 DIAGNOSIS — I25110 Atherosclerotic heart disease of native coronary artery with unstable angina pectoris: Secondary | ICD-10-CM

## 2014-10-12 DIAGNOSIS — Z01810 Encounter for preprocedural cardiovascular examination: Secondary | ICD-10-CM

## 2014-10-12 DIAGNOSIS — I5043 Acute on chronic combined systolic (congestive) and diastolic (congestive) heart failure: Secondary | ICD-10-CM | POA: Diagnosis present

## 2014-10-12 HISTORY — DX: Cerebral infarction, unspecified: I63.9

## 2014-10-12 HISTORY — DX: Unspecified visual field defects: H53.40

## 2014-10-12 LAB — POCT I-STAT 3, ART BLOOD GAS (G3+)
ACID-BASE DEFICIT: 2 mmol/L (ref 0.0–2.0)
Bicarbonate: 23.4 mEq/L (ref 20.0–24.0)
O2 SAT: 92 %
PO2 ART: 65 mmHg — AB (ref 80.0–100.0)
TCO2: 25 mmol/L (ref 0–100)
pCO2 arterial: 41.2 mmHg (ref 35.0–45.0)
pH, Arterial: 7.363 (ref 7.350–7.450)

## 2014-10-12 LAB — PROTIME-INR
INR: 1.05 (ref 0.00–1.49)
Prothrombin Time: 13.8 seconds (ref 11.6–15.2)

## 2014-10-12 LAB — BRAIN NATRIURETIC PEPTIDE: B NATRIURETIC PEPTIDE 5: 568.2 pg/mL — AB (ref 0.0–100.0)

## 2014-10-12 LAB — LIPID PANEL
Cholesterol: 161 mg/dL (ref 0–200)
HDL: 23 mg/dL — ABNORMAL LOW (ref >39)
LDL Cholesterol: 94 mg/dL (ref 0–99)
TRIGLYCERIDES: 220 mg/dL — AB (ref <150)
Total CHOL/HDL Ratio: 7 RATIO
VLDL: 44 mg/dL — ABNORMAL HIGH (ref 0–40)

## 2014-10-12 LAB — COMPREHENSIVE METABOLIC PANEL
ALK PHOS: 142 U/L — AB (ref 39–117)
ALT: 44 U/L (ref 0–53)
AST: 33 U/L (ref 0–37)
Albumin: 3 g/dL — ABNORMAL LOW (ref 3.5–5.2)
Anion gap: 7 (ref 5–15)
BUN: 14 mg/dL (ref 6–23)
CO2: 30 mmol/L (ref 19–32)
CREATININE: 0.97 mg/dL (ref 0.50–1.35)
Calcium: 8.3 mg/dL — ABNORMAL LOW (ref 8.4–10.5)
Chloride: 96 mEq/L (ref 96–112)
GFR calc Af Amer: >90 mL/min (ref >90)
GLUCOSE: 268 mg/dL — AB (ref 70–99)
POTASSIUM: 3.8 mmol/L (ref 3.5–5.1)
Sodium: 133 mmol/L — ABNORMAL LOW (ref 135–145)
TOTAL PROTEIN: 6.3 g/dL (ref 6.0–8.3)
Total Bilirubin: 0.9 mg/dL (ref 0.3–1.2)

## 2014-10-12 LAB — GLUCOSE, CAPILLARY
GLUCOSE-CAPILLARY: 169 mg/dL — AB (ref 70–99)
GLUCOSE-CAPILLARY: 249 mg/dL — AB (ref 70–99)
GLUCOSE-CAPILLARY: 273 mg/dL — AB (ref 70–99)
Glucose-Capillary: 289 mg/dL — ABNORMAL HIGH (ref 70–99)

## 2014-10-12 LAB — URINALYSIS, ROUTINE W REFLEX MICROSCOPIC
BILIRUBIN URINE: NEGATIVE
Glucose, UA: 100 mg/dL — AB
Hgb urine dipstick: NEGATIVE
Ketones, ur: NEGATIVE mg/dL
LEUKOCYTES UA: NEGATIVE
NITRITE: NEGATIVE
Protein, ur: NEGATIVE mg/dL
Specific Gravity, Urine: 1.009 (ref 1.005–1.030)
UROBILINOGEN UA: 1 mg/dL (ref 0.0–1.0)
pH: 6 (ref 5.0–8.0)

## 2014-10-12 LAB — CBC
HEMATOCRIT: 37.9 % — AB (ref 39.0–52.0)
Hemoglobin: 12.6 g/dL — ABNORMAL LOW (ref 13.0–17.0)
MCH: 29.1 pg (ref 26.0–34.0)
MCHC: 33.2 g/dL (ref 30.0–36.0)
MCV: 87.5 fL (ref 78.0–100.0)
PLATELETS: 173 10*3/uL (ref 150–400)
RBC: 4.33 MIL/uL (ref 4.22–5.81)
RDW: 13 % (ref 11.5–15.5)
WBC: 10.2 10*3/uL (ref 4.0–10.5)

## 2014-10-12 LAB — TYPE AND SCREEN
ABO/RH(D): O POS
Antibody Screen: NEGATIVE

## 2014-10-12 LAB — HEMOGLOBIN A1C
Hgb A1c MFr Bld: 9.7 % — ABNORMAL HIGH (ref <5.7)
Mean Plasma Glucose: 232 mg/dL — ABNORMAL HIGH (ref <117)

## 2014-10-12 LAB — TROPONIN I
TROPONIN I: 5.99 ng/mL — AB (ref <0.031)
Troponin I: 4.02 ng/mL (ref <0.031)
Troponin I: 5 ng/mL (ref <0.031)

## 2014-10-12 LAB — HEPARIN LEVEL (UNFRACTIONATED)
HEPARIN UNFRACTIONATED: 0.26 [IU]/mL — AB (ref 0.30–0.70)
Heparin Unfractionated: 0.24 IU/mL — ABNORMAL LOW (ref 0.30–0.70)

## 2014-10-12 LAB — ABO/RH: ABO/RH(D): O POS

## 2014-10-12 LAB — APTT: aPTT: 73 seconds — ABNORMAL HIGH (ref 24–37)

## 2014-10-12 MED ORDER — SODIUM CHLORIDE 0.9 % IV SOLN
INTRAVENOUS | Status: AC
  Administered 2014-10-13: 5.2 [IU]/h via INTRAVENOUS
  Filled 2014-10-12: qty 2.5

## 2014-10-12 MED ORDER — SODIUM CHLORIDE 0.9 % IV SOLN
INTRAVENOUS | Status: DC
  Filled 2014-10-12: qty 30

## 2014-10-12 MED ORDER — MAGNESIUM SULFATE 50 % IJ SOLN
40.0000 meq | INTRAMUSCULAR | Status: DC
  Filled 2014-10-12: qty 10

## 2014-10-12 MED ORDER — CEFUROXIME SODIUM 1.5 G IJ SOLR
1.5000 g | INTRAMUSCULAR | Status: AC
  Administered 2014-10-13: .75 g via INTRAVENOUS
  Administered 2014-10-13: 1.5 g via INTRAVENOUS
  Filled 2014-10-12: qty 1.5

## 2014-10-12 MED ORDER — EPINEPHRINE HCL 1 MG/ML IJ SOLN
0.0000 ug/min | INTRAVENOUS | Status: DC
  Filled 2014-10-12: qty 4

## 2014-10-12 MED ORDER — ALPRAZOLAM 0.25 MG PO TABS: 0.2500 mg | ORAL_TABLET | ORAL | Status: DC | PRN

## 2014-10-12 MED ORDER — DEXTROSE 5 % IV SOLN
750.0000 mg | INTRAVENOUS | Status: DC
  Filled 2014-10-12: qty 750

## 2014-10-12 MED ORDER — FUROSEMIDE 10 MG/ML IJ SOLN
40.0000 mg | Freq: Once | INTRAMUSCULAR | Status: AC
  Administered 2014-10-12: 40 mg via INTRAVENOUS
  Filled 2014-10-12: qty 4

## 2014-10-12 MED ORDER — MORPHINE SULFATE 4 MG/ML IJ SOLN
3.0000 mg | Freq: Once | INTRAMUSCULAR | Status: AC
  Administered 2014-10-12: 3 mg via INTRAVENOUS

## 2014-10-12 MED ORDER — PHENYLEPHRINE HCL 10 MG/ML IJ SOLN
30.0000 ug/min | INTRAVENOUS | Status: AC
  Administered 2014-10-13: 25 ug/min via INTRAVENOUS
  Administered 2014-10-13: 40 ug/min via INTRAVENOUS
  Filled 2014-10-12: qty 2

## 2014-10-12 MED ORDER — CHLORHEXIDINE GLUCONATE CLOTH 2 % EX PADS
6.0000 | MEDICATED_PAD | Freq: Once | CUTANEOUS | Status: AC
  Administered 2014-10-12: 6 via TOPICAL

## 2014-10-12 MED ORDER — NITROGLYCERIN IN D5W 200-5 MCG/ML-% IV SOLN
2.0000 ug/min | INTRAVENOUS | Status: AC
  Administered 2014-10-13: 5 ug/min via INTRAVENOUS
  Filled 2014-10-12 (×2): qty 250

## 2014-10-12 MED ORDER — METOPROLOL TARTRATE 50 MG PO TABS
50.0000 mg | ORAL_TABLET | Freq: Two times a day (BID) | ORAL | Status: DC
  Administered 2014-10-12 (×2): 50 mg via ORAL
  Filled 2014-10-12 (×4): qty 1

## 2014-10-12 MED ORDER — VANCOMYCIN HCL 1000 MG IV SOLR
INTRAVENOUS | Status: AC
  Administered 2014-10-13: 1000 mL
  Filled 2014-10-12: qty 1000

## 2014-10-12 MED ORDER — SODIUM CHLORIDE 0.9 % IV SOLN
INTRAVENOUS | Status: AC
  Administered 2014-10-13: 14 mL/h via INTRAVENOUS
  Filled 2014-10-12: qty 40

## 2014-10-12 MED ORDER — PLASMA-LYTE 148 IV SOLN
INTRAVENOUS | Status: AC
  Administered 2014-10-13: 500 mL
  Filled 2014-10-12: qty 2.5

## 2014-10-12 MED ORDER — PERFLUTREN LIPID MICROSPHERE
INTRAVENOUS | Status: AC
  Administered 2014-10-12: 2 mL
  Filled 2014-10-12: qty 10

## 2014-10-12 MED ORDER — GADOBENATE DIMEGLUMINE 529 MG/ML IV SOLN
20.0000 mL | Freq: Once | INTRAVENOUS | Status: AC | PRN
  Administered 2014-10-12: 20 mL via INTRAVENOUS

## 2014-10-12 MED ORDER — HEPARIN (PORCINE) IN NACL 100-0.45 UNIT/ML-% IJ SOLN
2800.0000 [IU]/h | INTRAMUSCULAR | Status: DC
  Administered 2014-10-12: 2800 [IU]/h via INTRAVENOUS
  Filled 2014-10-12: qty 250

## 2014-10-12 MED ORDER — CHLORHEXIDINE GLUCONATE CLOTH 2 % EX PADS
6.0000 | MEDICATED_PAD | Freq: Once | CUTANEOUS | Status: AC
  Administered 2014-10-13: 6 via TOPICAL

## 2014-10-12 MED ORDER — VANCOMYCIN HCL 10 G IV SOLR
1500.0000 mg | INTRAVENOUS | Status: AC
  Administered 2014-10-13: 1500 mg via INTRAVENOUS
  Filled 2014-10-12: qty 1500

## 2014-10-12 MED ORDER — DEXMEDETOMIDINE HCL IN NACL 400 MCG/100ML IV SOLN
0.1000 ug/kg/h | INTRAVENOUS | Status: AC
  Administered 2014-10-13: 0.2 ug/kg/h via INTRAVENOUS
  Filled 2014-10-12 (×2): qty 100

## 2014-10-12 MED ORDER — INSULIN ASPART 100 UNIT/ML ~~LOC~~ SOLN
11.0000 [IU] | Freq: Once | SUBCUTANEOUS | Status: AC
  Administered 2014-10-13: 11 [IU] via SUBCUTANEOUS

## 2014-10-12 MED ORDER — POTASSIUM CHLORIDE 2 MEQ/ML IV SOLN
80.0000 meq | INTRAVENOUS | Status: DC
  Filled 2014-10-12: qty 40

## 2014-10-12 MED ORDER — DOPAMINE-DEXTROSE 3.2-5 MG/ML-% IV SOLN
0.0000 ug/kg/min | INTRAVENOUS | Status: DC
  Filled 2014-10-12 (×2): qty 250

## 2014-10-12 MED ORDER — CHLORHEXIDINE GLUCONATE 0.12 % MT SOLN
15.0000 mL | Freq: Two times a day (BID) | OROMUCOSAL | Status: DC
  Administered 2014-10-12 (×2): 15 mL via OROMUCOSAL
  Filled 2014-10-12 (×2): qty 15

## 2014-10-12 MED ORDER — BISACODYL 5 MG PO TBEC
5.0000 mg | DELAYED_RELEASE_TABLET | Freq: Once | ORAL | Status: AC
  Administered 2014-10-13: 5 mg via ORAL
  Filled 2014-10-12: qty 1

## 2014-10-12 MED ORDER — MORPHINE SULFATE 4 MG/ML IJ SOLN
INTRAMUSCULAR | Status: AC
  Filled 2014-10-12: qty 1

## 2014-10-12 MED ORDER — METOPROLOL TARTRATE 12.5 MG HALF TABLET
12.5000 mg | ORAL_TABLET | Freq: Once | ORAL | Status: AC
  Administered 2014-10-13: 12.5 mg via ORAL
  Filled 2014-10-12: qty 1

## 2014-10-12 MED ORDER — TEMAZEPAM 15 MG PO CAPS
15.0000 mg | ORAL_CAPSULE | Freq: Once | ORAL | Status: AC | PRN
  Administered 2014-10-12: 15 mg via ORAL
  Filled 2014-10-12: qty 1

## 2014-10-12 NOTE — Progress Notes (Addendum)
ANTICOAGULATION CONSULT NOTE - Follow Up Consult  Pharmacy Consult for heparin Indication: CAD awaiting TCTS consult  Allergies  Allergen Reactions  . Nitroglycerin Other (See Comments)    hypotension    Patient Measurements: Height:  (180.3 cm) Weight: (!) 331 lb 9.2 oz (150.4 kg) IBW/kg (Calculated) : 75.3 Heparin Dosing Weight: 111 kg  Vital Signs: Temp: 97.9 F (36.6 C) (01/03 1112) Temp Source: Oral (01/03 1112) BP: 147/90 mmHg (01/03 1100) Pulse Rate: 99 (01/03 1100)  Labs:  Recent Labs  10/11/14 0040 10/11/14 0630 10/11/14 1150 10/11/14 1650 10/12/14 0105 10/12/14 1045  HGB 13.9 13.2  --   --  12.6*  --   HCT 41.6 39.2  --   --  37.9*  --   PLT 186 168  --   --  173  --   APTT 36  --   --   --   --   --   LABPROT 13.4  --   --   --   --   --   INR 1.01  --   --   --   --   --   HEPARINUNFRC  --   --   --  0.10* <0.10* 0.24*  CREATININE 1.32  --   --   --   --   --   TROPONINI 4.00* 5.08* 4.92*  --   --  4.02*    Estimated Creatinine Clearance: 108.6 mL/min (by C-G formula based on Cr of 1.32).   Medications:  Infusions:  . sodium chloride Stopped (10/12/14 0900)  . heparin 2,600 Units/hr (10/12/14 1159)  . tirofiban 0.15 mcg/kg/min (10/12/14 1109)    Assessment: 44 y/o obese male who admitted with chest pain and taken to the cath lab. Patient continues on IV heparin for CAD and is awaiting TCTS consult. Heparin level is still below goal on heparin 2450 units/hr. Pt is also on Aggrastat. No bleeding reported by RN.   Goal of Therapy:  Heparin level 0.3-0.5 units/ml (while on 2b3a inhibitor) Monitor platelets by anticoagulation protocol: Yes   Plan:  - Increase heparin drip to 2600 units/hr - 6 hr heparin level - Daily heparin level and CBC - Monitor for s/sx of bleeding  Tad Moore, BCPS  Clinical Pharmacist Pager [phone]  10/12/2014 12:48 PM    Addendum: Heparin level is just below goal at 0.26 on 2600 units/hr.  No bleeding noted.  Increase heparin drip to 2800 units/hr - off 1/4 at 05:00  [facility], [place].D., BCPS Clinical Pharmacist Pager: [phone] 10/12/2014 7:41 PM

## 2014-10-12 NOTE — Progress Notes (Signed)
CSW order received that patient does not have a PCP and thus unable to complete follow up appointments. CSW will notify RNCM on Monday for follow up. CSW will sign off for now but available to assist if needed.  Lorri Frederick. Jaci Lazier, [place] [phone]

## 2014-10-12 NOTE — Progress Notes (Signed)
  Echocardiogram 2D Echocardiogram has been performed.  Delcie Roch 10/12/2014, 2:50 PM

## 2014-10-12 NOTE — Consult Note (Addendum)
[address]       Mark Leach [address]             [phone]          CARDIOTHORACIC SURGERY CONSULTATION REPORT  PCP is No primary care provider on file. 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  The patient has been morbidly obese for the majority of his life.  He suffered an acute anterior wall myocardial infarction in April 2014.  He was treated at [facility] in [place] at that time where he underwent cardiac catheterization with PCI and stenting of the left anterior descending coronary artery.  He was followed briefly by a cardiologist who subsequently discharged him from follow-up and told him to seek long-term care with a primary care physician. The patient has not been seen in follow-up by any physician until October of this year when he was hospitalized at [facility] with sinusitis and upper respiratory tract infection. He was diagnosed with type 2 diabetes mellitus and hypertension at that time, and started on oral metformin and lisinopril. He remained in his usual state of health with a persistent cough until several days prior to this admission when he suddenly began to experience resting substernal chest tightness and shortness of breath.  Symptoms waxed and waned in severity, ultimately causing him to present to the ED at [facility] on 10/10/2013. He was diagnosed with acute myocardial  infarction and transferred via EMS to [facility] for further therapy. At the time of admission he was noted to have EKG changes consistent with previous anterior wall myocardial infarction with possible ongoing recurrent ST segment elevation myocardial infarction. Symptoms of chest pain and shortness of breath had nearly completely resolved.   He was taken to the cardiac cath lab by Dr. Tresa Endo where he was found have subtotal occlusion of the ostial left anterior descending coronary artery followed by chronic occlusion of the proximal left anterior descending coronary artery with severe three-vessel coronary artery disease and severe left ventricular dysfunction.   The patient has remained clinically stable since cath on intravenous nitroglycerin, heparin, and Aggrastat.  Elective cardiothoracic surgical consultation was requested this morning.  The patient states that prior to his acute presentation he has stable symptoms of mild exertional shortness of breath and chronic bilateral lower extremity edema.  He reports living a somewhat sedentary lifestyle. Activity is occasionally limited by chronic bilateral knee pain related to degenerative arthritis, but overall the patient reports no significant chronic physical limitations. He denies any previous knowledge of hyperlipidemia. He states that he was first told he was diabetic in October of 2015 during his hospitalization at [facility].  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  He did not seek medical attention for this problem.  He has had a persistent productive cough for the past month or two.  Over several days prior to admission he developed resting substernal  chest tightness, resting shortness of breath, orthopnea, and intermittent dizzy spells.  Past Medical History  Diagnosis Date  . Coronary artery disease 01/2013    anterior MI with cath showing 95% pLAD, 90% diag,  30% OM1, 70% mRCA, 60% dRCA s/p PCI with DES to LAD and diag and EF 40%  . Ischemic dilated cardiomyopathy   . Hypertension   . Morbid obesity   . Acute on chronic combined systolic and diastolic heart failure   . Visual field defect 10/12/2014  . Diabetes mellitus type 2, uncontrolled, with complications     Past Surgical History  Procedure Laterality Date  . Cardiac catheterization  01/2013    [facility] in [place]  . Coronary angioplasty  01/2013    PCI with stenting of LAD  . Cholecystectomy    . Left heart cath N/A 10/10/2014    Procedure: LEFT HEART CATH;  Surgeon: Lennette Bihari, MD;  Location: [facility] CATH LAB;  Service: Cardiovascular;  Laterality: N/A;    Family History  Problem Relation Age of Onset  . Heart attack Brother 37    History   Social History  . Marital Status: Married    Spouse Name: N/A    Number of Children: N/A  . Years of Education: N/A   Occupational History  . Not on file.   Social History Main Topics  . Smoking status: Former Smoker    Quit date: 10/12/2007  . Smokeless tobacco: Not on file  . Alcohol Use: No  . Drug Use: No  . Sexual Activity: Not on file   Other Topics Concern  . Not on file   Social History Narrative   Lives w/ wife and 42 yr old daughter in [place], full time student at [employer]    Prior to Admission medications   Medication Sig Start Date End Date Taking? Authorizing Provider  aspirin EC 81 MG tablet Take 81 mg by mouth daily.   Yes Historical Provider, MD  lisinopril-hydrochlorothiazide (PRINZIDE,ZESTORETIC) 10-12.5 MG per tablet Take 1 tablet by mouth daily.   Yes Historical Provider, MD  metFORMIN (GLUCOPHAGE) 500 MG tablet Take 500 mg by mouth 2 (two) times daily with a meal.   Yes Historical Provider, MD    Current Facility-Administered Medications  Medication Dose Route Frequency Provider Last Rate Last Dose  . acetaminophen (TYLENOL) tablet 650 mg  650 mg Oral Q4H PRN Quintella Reichert, MD      . ALPRAZolam Prudy Feeler)  tablet 0.25-0.5 mg  0.25-0.5 mg Oral Q4H PRN Purcell Nails, MD      . aspirin EC tablet 81 mg  81 mg Oral Daily Quintella Reichert, MD   81 mg at 10/12/14 1191  . atorvastatin (LIPITOR) tablet 80 mg  80 mg Oral q1800 Quintella Reichert, MD   80 mg at 10/11/14 1758  . heparin ADULT infusion 100 units/mL (25000 units/250 mL)  2,600 Units/hr Intravenous Continuous Purcell Nails, MD 26 mL/hr at 10/12/14 1250 2,600 Units/hr at 10/12/14 1250  . insulin aspart (novoLOG) injection 0-20 Units  0-20 Units Subcutaneous TID WC Quintella Reichert, MD   7 Units at 10/12/14 1119  . lisinopril (PRINIVIL,ZESTRIL) tablet 5 mg  5 mg Oral Daily Quintella Reichert, MD   5 mg at 10/12/14 0924  . metoprolol (LOPRESSOR) tablet 50 mg  50 mg Oral BID Duke Salvia, MD   50 mg at 10/12/14 0924  . nitroGLYCERIN (NITROSTAT) SL tablet 0.4 mg  0.4 mg Sublingual Q5 Min x  3 PRN Quintella Reichert, MD      . ondansetron (ZOFRAN) injection 4 mg  4 mg Intravenous Q6H PRN Quintella Reichert, MD      . tirofiban (AGGRASTAT) infusion 50 mcg/mL 100 mL  0.15 mcg/kg/min Intravenous Continuous Purcell Nails, MD 27.1 mL/hr at 10/12/14 1109 0.15 mcg/kg/min at 10/12/14 1109    Allergies  Allergen Reactions  . Nitroglycerin Other (See Comments)    Hypotension, syncope, bradycardia      Review of Systems:   General:  normal appetite, decreased energy, no weight gain, + 50 lb weight loss using diet over past 3-4 months, no fever  Cardiac:  + chest pain with exertion, + chest pain at rest, + SOB with exertion, + resting SOB, no PND, + orthopnea, no palpitations, no arrhythmia, no atrial fibrillation, + chronic LE edema, + dizzy spells, no syncope  Respiratory:  + shortness of breath, no home oxygen, + chronic productive cough, no dry cough, no bronchitis, no wheezing, no hemoptysis, no asthma, no pain with inspiration or cough, no known sleep apnea, no CPAP at night  GI:   no difficulty swallowing, no reflux, no frequent heartburn, no hiatal hernia, no  abdominal pain, no constipation, no diarrhea, no hematochezia, no hematemesis, no melena  GU:   no dysuria,  no frequency, no urinary tract infection, no hematuria, no enlarged prostate, no kidney stones, no previously diagnosed kidney disease but creatinine 1.3 on admission  Vascular:  no pain suggestive of claudication, no pain in feet, no leg cramps, no varicose veins, no DVT, no non-healing foot ulcer  Neuro:   ? previous stroke due to persistent visual field deficit, no TIA's, no seizures, no headaches, no temporary blindness one eye,  no slurred speech, no peripheral neuropathy, no chronic pain, no instability of gait, no memory/cognitive dysfunction  Musculoskeletal: + arthritis in both knees, no joint swelling, no myalgias, no difficulty walking, normal mobility   Skin:   no rash, no itching, no skin infections, no pressure sores or ulcerations  Psych:   no anxiety, + depression, no nervousness, no unusual recent stress  Eyes:   no blurry vision, no floaters, + recent vision changes with persistent inability to see left visual field, no wears glasses or contacts  ENT:   no hearing loss, + loose or painful teeth, no dentures, last saw dentist many years ago  Hematologic:  no easy bruising, no abnormal bleeding, no clotting disorder, no frequent epistaxis  Endocrine:  + diabetes,routinely checks CBG's at home - not under good control     Physical Exam:   BP 144/89 mmHg  Pulse 96  Temp(Src) 97.9 F (36.6 C) (Oral)  Resp 29  Ht  (1.803 m)  Wt 150.4 kg (331 lb 9.2 oz)  BMI 46.27 kg/m2  SpO2 99%  General:  Morbidly obese male in NAD    HEENT:  Unremarkable except very poor dentition  Neck:   no JVD, no bruits, no adenopathy   Chest:   clear to auscultation, symmetrical breath sounds, no wheezes, no rhonchi   CV:   RRR, no murmur   Abdomen:  Extremely obese, soft, non-tender, no masses   Extremities:  warm, well-perfused, pulses diminished, + bilateral lower extremity  edema  Rectal/GU  Deferred  Neuro:   Grossly non-focal and symmetrical throughout with exception of dense visual field deficit on left side, both eyes  Skin:   Clean and dry, no rashes, no breakdown, + chronic hemosiderosis both lower legs c/w  chronic venous insufficiency  Diagnostic Tests:  CARDIAC CATHETERIZATION   HISTORY:   Mark Leach is a 44 y.o. male who has a history of prior anterior wall myocardial infarction and had undergone stenting to his LAD diagonal system at [facility] in April 2014. He has not had Cardiologic follow-up. He presented to [facility] hospital this evening after 3 days of progressive chest burning and inability to lie flat with dyspnea. His ECG showed Q waves anteriorly, but had more progressive ST elevation. A code STEMI was called and he was transported to [facility] for urgent cardiac catheterization.   PROCEDURE: Left heart catheterization: coronary angiography, left ventriculography   The patient was brought to the [facility] cardiac catherization laboratory in transfer from [facility]. Upon arrival to the catheterization laboratory he had residual 3 had a 10 chest burning. His right groin was prepped and draped in sterile fashion. His right femoral artery was punctured without difficulty with 1 anterior stick. A 6 French arterial sheath was inserted. Diagnostic catheterization was done with 5 French Judkins 5 left and JR4 right coronary catheters. A 5 French pigtail catheter was used for left ventriculography. In light of the patient's severe coronary anatomy, Aggrastat was started. At completion of the procedure, the patient's chest burning had completely resolved. Hemostasis was obtained by direct manual pressure.  HEMODYNAMICS:  Central Aorta: 110/70  Left Ventricle: 110/12/22  ANGIOGRAPHY:  Left main: Moderate size vessel which trifurcated into an LAD and intermediate vessel and left circumflex coronary  artery.  LAD: The LAD was subtotally occluded at its ostium and there was diffuse 95-99% ostial proximal stenosis and then was totally occluded in the region of the first diagonal branch. There was a gap and then faint filling of a second diagonal branch which had a stent and there was faint filling of the LAD beyond the diagonal vessel via collaterals.  Ramus Intermediate: Small caliber vessel free of significant disease.  Left circumflex: Large caliber vessel that gave rise to 2 major marginal branches and then in the posterior lateral like coronary artery. The first marginal branch was moderate size and had proximal 90% followed by 80% stenoses. A distal superior branch had 95% stenosis.  Right coronary artery: Moderate size vessel that had 95% mid stenosis and 80% distal stenosis in the region of the acute margin. The vessel supplied the PDA. There were septal collaterals to the LAD.   Left ventriculography revealed dilated ventricle with an ejection fraction of 25% with diffuse global hypokinesis   IMPRESSION:  Severe ischemic dilated cardiomyopathy with an ejection fraction approximately 25%.  Severe multivessel coronary obstructive disease with diffusely diseased subtotal occlusion of the ostial and proximal LAD with faint collaterals to the first and second diagonal vessel and more distal LAD; left circumflex coronary artery with tandem 90 and 80% obtuse marginal 1 stenosis with distal 95% stenosis in this distal superior branch of this marginal vessel; and 95% mid RCA stenosis with 80% stenosis in the region of the acute margin with evidence for septal collaterals from the PDA to the LAD.  RECOMMENDATION:  The patient has surgical anatomy and CABG revascularization surgery will be recommended with surgical consultation in the morning. The patient was started on Aggrastat post procedure to reduce likelihood of progressive thrombosis. An attempt was made to initiate low-dose IV  nitroglycerin, but the patient transiently dropped his blood pressure and this was discontinued.    Lennette Bihari, MD, [facility] 10/10/2014 11:43 PM  PORTABLE CHEST - 1 VIEW SAME DAY  COMPARISON: 10/10/2014  FINDINGS: Midline trachea. Mild cardiomegaly. Mediastinal contours otherwise within normal limits. No pleural effusion or pneumothorax. mild pulmonary interstitial thickening, new or increased. No lobar consolidation.  IMPRESSION: Cardiomegaly with mild pulmonary interstitial thickening. Although this could represent the sequelae of smoking/chronic bronchitis, mild pulmonary venous congestion cannot be excluded. No overt congestive failure.   Electronically Signed  By: Jeronimo Greaves M.D.  On: 10/12/2014 10:21   Impression:  Patient has severe three-vessel coronary artery disease with severe left ventricular systolic dysfunction and presents with acute myocardial infarction complicated by acute on chronic combined systolic and diastolic congestive heart failure, NYHA functional class IV.  He has remained clinically stable since hospital admission on intravenous nitroglycerin, heparin, and Aggrastat.  Risks associated with surgical intervention will be high because of the patient's underlying severe left ventricular dysfunction and numerous comorbid medical problems. There are no reasonable alternatives to surgical revascularization.   Plan:  I have reviewed the indications, risks, and potential benefits of coronary artery bypass grafting with the patient and his family.  Alternative treatment strategies have been discussed.  The patient understands and accepts all potential associated risks of surgery including but not limited to risk of death, stroke or other neurologic complication, myocardial infarction, congestive heart failure, respiratory failure, renal failure, bleeding requiring blood transfusion and/or reexploration, aortic dissection or  other major vascular complication, arrhythmia, heart block or bradycardia requiring permanent pacemaker, pneumonia, pleural effusion, wound infection, pulmonary embolus or other thromboembolic complication, chronic pain or other delayed complications related to median sternotomy, or the late recurrence of symptomatic ischemic heart disease and/or congestive heart failure.  The importance of long term risk modification have been emphasized.  All questions answered.  We will obtain follow-up echocardiogram to reassess left ventricular function and MRI of the brain to rule out previous stroke given the patient's underlying visual field deficit.  We will tentatively plan to proceed with surgery tomorrow with Centrimag ventricular assist backup available.   I spent in excess of 120 minutes during the conduct of this hospital consultation and >50% of this time involved direct face-to-face encounter for counseling and/or coordination of the patient's care.   Salvatore Decent. Cornelius Moras, MD 10/12/2014 1:30 PM

## 2014-10-12 NOTE — Progress Notes (Signed)
Pt complaining of feeling anxious, and burning sensation 2/10 in chest accompanied with SOB 3/10. 2L [place] placed on pt and Dr Graciela Husbands at bedside. Orders given for STAT  morphine, EKG and to stop maintenance fluid at 133ml/hr. Orders complete, Dr Graciela Husbands with EKG and pt resting stating burning sensation less then 1 at present. Will continue to monitor. Will continue to monitor.

## 2014-10-12 NOTE — Progress Notes (Signed)
Inpatient Diabetes Program Recommendations  AACE/ADA: New Consensus Statement on Inpatient Glycemic Control (2013)  Target Ranges:  Prepandial:   less than 140 mg/dL      Peak postprandial:   less than 180 mg/dL (1-2 hours)      Critically ill patients:  140 - 180 mg/dL   Results for ZIA, KANNER (MRN [id number]) as of 10/12/2014 12:11  Ref. Range 10/11/2014 07:20 10/11/2014 11:44 10/11/2014 17:42 10/11/2014 21:24 10/12/2014 08:02 10/12/2014 11:10  Glucose-Capillary Latest Range: 70-99 mg/dL 409 (H) 811 (H) 914 (H) 164 (H) 169 (H) 249 (H)   Diabetes history: DM2 Outpatient Diabetes medications: Metformin 500 mg BID Current orders for Inpatient glycemic control: Novolog 0-20 units TID with meals  Inpatient Diabetes Program Recommendations Correction (SSI): Please consider ordering Novolog bedtime correction scale. Insulin - Meal Coverage: Please consider ordering Novolog 5 units TID with meals if patient is eating at least 50% of meals.  Thanks, Orlando Penner, RN, MSN, CCRN, CDE Diabetes Coordinator Inpatient Diabetes Program [phone] (Team Pager) [phone] ([facility] office) [phone] [facility] office)

## 2014-10-12 NOTE — Research (Signed)
LEVO-CTS Informed Consent   Subject Name: Mark Leach  Subject met inclusion and exclusion criteria.  The informed consent form, study requirements and expectations were reviewed with the subject and questions and concerns were addressed prior to the signing of the consent form.  The subject verbalized understanding of the trial requirements.  The subject agreed to participate in the LEVO-CTS trial and signed the informed consent.  The informed consent was obtained prior to performance of any protocol-specific procedures for the subject.  A copy of the signed informed consent was given to the subject and a copy was placed in the subject's medical record.  [name] 10/12/2014, 5:23 PM

## 2014-10-12 NOTE — Progress Notes (Signed)
ANTICOAGULATION CONSULT NOTE - Follow Up Consult  Pharmacy Consult for heparin Indication: CAD awaiting TCTS consult  Allergies  Allergen Reactions  . Nitroglycerin Other (See Comments)    hypotension    Patient Measurements: Height:  (180.3 cm) Weight: (!) 331 lb 9.2 oz (150.4 kg) IBW/kg (Calculated) : 75.3 Heparin Dosing Weight: 111 kg  Vital Signs: Temp: 99.1 F (37.3 C) (01/03 0100) Temp Source: Oral (01/03 0100) BP: 144/71 mmHg (01/03 0100) Pulse Rate: 101 (01/03 0100)  Labs:  Recent Labs  10/11/14 0040 10/11/14 0630 10/11/14 1150 10/11/14 1650 10/12/14 0105  HGB 13.9 13.2  --   --  12.6*  HCT 41.6 39.2  --   --  37.9*  PLT 186 168  --   --  173  APTT 36  --   --   --   --   LABPROT 13.4  --   --   --   --   INR 1.01  --   --   --   --   HEPARINUNFRC  --   --   --  0.10* <0.10*  CREATININE 1.32  --   --   --   --   TROPONINI 4.00* 5.08* 4.92*  --   --     Estimated Creatinine Clearance: 108.6 mL/min (by C-G formula based on Cr of 1.32).   Medications:  Infusions:  . sodium chloride 100 mL/hr at 10/11/14 1957  . DOPamine Stopped (10/11/14 0200)  . heparin 2,100 Units/hr (10/11/14 2225)  . tirofiban 0.15 mcg/kg/min (10/12/14 0304)    Assessment: 44 y/o obese male who admitted with chest pain and taken to the cath lab. Patient continues on IV heparin for CAD and is awaiting TCTS consult. Heparin level is undetectable on 2100 units/hr. Pt is also on Aggrastat. No bleeding reported by RN.   Goal of Therapy:  Heparin level 0.3-0.5 units/ml (while on 2b3a inhibitor) Monitor platelets by anticoagulation protocol: Yes   Plan:  - Increase heparin drip to 2450 units/hr - 6 hr heparin level - Daily heparin level and CBC - Monitor for s/sx of bleeding  Vinnie Level, PharmD., BCPS Clinical Pharmacist Pager [phone]

## 2014-10-12 NOTE — Progress Notes (Signed)
VASCULAR LAB PRELIMINARY  PRELIMINARY  PRELIMINARY  PRELIMINARY  Pre-op Cardiac Surgery  Carotid Findings:  Bilateral:  1-39% ICA stenosis.  Vertebral artery flow is antegrade.     Upper Extremity Right Left  Brachial Pressures 141 Triphasic 123 Triphasic  Radial Waveforms Triphasic Triphasic  Ulnar Waveforms Triphasic Triphasic  Palmar Arch (Allen's Test) Normal Normal   Findings:  Doppler waveforms remained normal bilaterally with both radial and ulnar compressions    Lower  Extremity Right Left  Dorsalis Pedis 141 Triphasic 139 Triphasic  Posterior Tibial 147 Triphasic 146 Triphasic  Ankle/Brachial Indices 1.04 1.04    Findings:  ABIs and Doppler waveforms indicate normal arterial flow bilaterally at rest.   Mark Leach, RVS 10/12/2014, 12:13 PM

## 2014-10-12 NOTE — Progress Notes (Signed)
Patient Name: Mark Leach      SUBJECTIVE: asdmitted 1/1 with Chest Pain>>    CATH LAD: The LAD was subtotally occluded at its ostium and there was diffuse 95-99% ostial proximal stenosis and then was totally occluded in the region of the first diagonal branch. There was a gap and then faint filling of a second diagonal branch which had a stent and there was faint filling of the LAD beyond the diagonal vessel via collaterals.  Ramus Intermediate: Small caliber vessel free of significant disease.  Left circumflex: Large caliber vessel that gave rise to 2 major marginal branches and then in the posterior lateral like coronary artery. The first marginal branch was moderate size and had proximal 90% followed by 80% stenoses. A distal superior branch had 95% stenosis.  Right coronary artery: Moderate size vessel that had 95% mid stenosis and 80% distal stenosis in the region of the acute margin. The vessel supplied the PDA. There were septal collaterals to the LAD  EF 25%  Surgery consult anticipated today   Recurrent chest burning this am when he went to the bathroom assoc with anxiety and worsening shortness of breath  Has hx of NTG intolerance with profound hypotension   Has hx of AWMI 4/14 [facility] without cardiology followup  CATH showing 95% prox LAD, 90% D1, 30% OM1, 70% mid RCA, 60% distal RCA and EF 40%. Has DM morbid obesity   Past Medical History  Diagnosis Date  . Coronary artery disease 01/2013    anterior MI with cath showing 95% pLAD, 90% diag, 30% OM1, 70% mRCA, 60% dRCA s/p PCI with DES to LAD and diag and EF 40%  . Ischemic dilated cardiomyopathy   . Hypertension   . Morbid obesity   . Diabetes mellitus without complication     Scheduled Meds:  Scheduled Meds: . aspirin EC  81 mg Oral Daily  . atorvastatin  80 mg Oral q1800  . insulin aspart  0-20 Units Subcutaneous TID WC  . lisinopril  5 mg Oral Daily  . metoprolol tartrate  25 mg Oral Q8H    Continuous Infusions: . sodium chloride 100 mL/hr at 10/11/14 1957  . DOPamine Stopped (10/11/14 0200)  . heparin 2,450 Units/hr (10/12/14 0316)  . tirofiban 0.15 mcg/kg/min (10/12/14 0654)   acetaminophen, nitroGLYCERIN, ondansetron (ZOFRAN) IV    PHYSICAL EXAM Filed Vitals:   10/12/14 0300 10/12/14 0400 10/12/14 0500 10/12/14 0800  BP: 144/84 133/58 126/74 137/77  Pulse: 106 103 96 96  Temp:  98.4 F (36.9 C)  97.7 F (36.5 C)  TempSrc:  Oral  Oral  Resp: Height:      Weight:      SpO2: 94% 94% 92% 95%    Well developed morbidly obese  in  Mod resp distress HENT normal Neck supple  JVP aobuty 10   Clear but decrease BS Regular rate and rhythm, no murmurs or gallops Abd-soft with active BS No Clubbing cyanosis edema Skin-warm mildy diaphoretic A & Oriented  Grossly normal sensory and motor function Affect depressd TELEMETRY: Reviewed telemetry pt in sinus tach    Intake/Output Summary (Last 24 hours) at 10/12/14 0850 Last data filed at 10/12/14 0800  Gross per 24 hour  Intake 4293.47 ml  Output   1050 ml  Net 3243.47 ml    LABS: Basic Metabolic Panel:  Recent Labs Lab 10/11/14 0040  NA 137  K 4.5  CL 102  CO2 28  GLUCOSE 345*  BUN 15  CREATININE 1.32  CALCIUM 8.2*  MG 1.6   Cardiac Enzymes:  Recent Labs  10/11/14 0040 10/11/14 0630 10/11/14 1150  TROPONINI 4.00* 5.08* 4.92*   CBC:  Recent Labs Lab 10/11/14 0040 10/11/14 0630 10/12/14 0105  WBC 12.0* 10.2 10.2  NEUTROABS 9.8*  --   --   HGB 13.9 13.2 12.6*  HCT 41.6 39.2 37.9*  MCV 88.3 87.5 87.5  PLT 186 168 173   PROTIME:  Recent Labs  10/11/14 0040  LABPROT 13.4  INR 1.01   Liver Function Tests:  Recent Labs  10/11/14 0040  AST 38*  ALT 53  ALKPHOS 147*  BILITOT 0.7  PROT 6.1  ALBUMIN 3.1*  Thyroid Function Tests:  Recent Labs  10/11/14 0040  TSH 3.047    ECGs unchanged except for mild worsening of STs   ASSESSMENT AND  PLAN:  Active Problems:   ST elevation myocardial infarction (STEMI) involving left anterior descending (LAD) coronary artery with complication   Coronary artery disease   Ischemic dilated cardiomyopathy   Hypertension   Morbid obesity   Diabetes mellitus without complication  Continue   care at this point with tirofaban/heparin But with more chest pain will add morphine as has not tolerated NTG in the past Recheck troponin DOE with elevated JVP suspect acute sys CHF Begin lasix CXR  Await CVTS consult assumed had been called  i have called today Augment BB for hypertension and tachycardia Will need diabetes consult  hgb A1C pending BP is improved HR still high so will increase metop>>50 bid Will get echo to assess LVEF   Signed, Sherryl Manges MD  10/12/2014

## 2014-10-13 ENCOUNTER — Encounter (HOSPITAL_COMMUNITY): Payer: Self-pay | Admitting: Anesthesiology

## 2014-10-13 ENCOUNTER — Inpatient Hospital Stay (HOSPITAL_COMMUNITY): Payer: 59 | Admitting: Anesthesiology

## 2014-10-13 ENCOUNTER — Encounter (HOSPITAL_COMMUNITY)
Admission: EM | Disposition: A | Discharge status: Home / Self Care | Payer: Self-pay | Source: Home / Self Care | Attending: Thoracic Surgery (Cardiothoracic Vascular Surgery)

## 2014-10-13 ENCOUNTER — Inpatient Hospital Stay (HOSPITAL_COMMUNITY): Payer: 59

## 2014-10-13 ENCOUNTER — Encounter (HOSPITAL_COMMUNITY)
Admission: EM | Disposition: A | Discharge status: Home / Self Care | Payer: MEDICAID | Source: Home / Self Care | Attending: Thoracic Surgery (Cardiothoracic Vascular Surgery)

## 2014-10-13 DIAGNOSIS — Z951 Presence of aortocoronary bypass graft: Secondary | ICD-10-CM

## 2014-10-13 HISTORY — DX: Presence of aortocoronary bypass graft: Z95.1

## 2014-10-13 HISTORY — PX: CORONARY ARTERY BYPASS GRAFT: SHX141

## 2014-10-13 LAB — POCT I-STAT, CHEM 8
BUN: 15 mg/dL (ref 6–23)
BUN: 14 mg/dL (ref 6–23)
BUN: 15 mg/dL (ref 6–23)
BUN: 14 mg/dL (ref 6–23)
BUN: 15 mg/dL (ref 6–23)
BUN: 14 mg/dL (ref 6–23)
BUN: 11 mg/dL (ref 6–23)
CALCIUM ION: 1.04 mmol/L — AB (ref 1.12–1.23)
CHLORIDE: 97 meq/L (ref 96–112)
CHLORIDE: 96 meq/L (ref 96–112)
CHLORIDE: 97 meq/L (ref 96–112)
CREATININE: 0.8 mg/dL (ref 0.50–1.35)
CREATININE: 0.9 mg/dL (ref 0.50–1.35)
Calcium, Ion: 1.17 mmol/L (ref 1.12–1.23)
Calcium, Ion: 1.17 mmol/L (ref 1.12–1.23)
Calcium, Ion: 1.08 mmol/L — ABNORMAL LOW (ref 1.12–1.23)
Calcium, Ion: 1.08 mmol/L — ABNORMAL LOW (ref 1.12–1.23)
Calcium, Ion: 1.04 mmol/L — ABNORMAL LOW (ref 1.12–1.23)
Calcium, Ion: 1.13 mmol/L (ref 1.12–1.23)
Chloride: 98 mEq/L (ref 96–112)
Chloride: 92 mEq/L — ABNORMAL LOW (ref 96–112)
Chloride: 97 mEq/L (ref 96–112)
Chloride: 101 mEq/L (ref 96–112)
Creatinine, Ser: 0.9 mg/dL (ref 0.50–1.35)
Creatinine, Ser: 0.9 mg/dL (ref 0.50–1.35)
Creatinine, Ser: 0.9 mg/dL (ref 0.50–1.35)
Creatinine, Ser: 0.8 mg/dL (ref 0.50–1.35)
Creatinine, Ser: 0.8 mg/dL (ref 0.50–1.35)
GLUCOSE: 233 mg/dL — AB (ref 70–99)
GLUCOSE: 165 mg/dL — AB (ref 70–99)
Glucose, Bld: 254 mg/dL — ABNORMAL HIGH (ref 70–99)
Glucose, Bld: 269 mg/dL — ABNORMAL HIGH (ref 70–99)
Glucose, Bld: 346 mg/dL — ABNORMAL HIGH (ref 70–99)
Glucose, Bld: 364 mg/dL — ABNORMAL HIGH (ref 70–99)
Glucose, Bld: 313 mg/dL — ABNORMAL HIGH (ref 70–99)
HCT: 36 % — ABNORMAL LOW (ref 39.0–52.0)
HCT: 27 % — ABNORMAL LOW (ref 39.0–52.0)
HCT: 26 % — ABNORMAL LOW (ref 39.0–52.0)
HEMATOCRIT: 35 % — AB (ref 39.0–52.0)
HEMATOCRIT: 28 % — AB (ref 39.0–52.0)
HEMATOCRIT: 26 % — AB (ref 39.0–52.0)
HEMATOCRIT: 28 % — AB (ref 39.0–52.0)
HEMOGLOBIN: 11.9 g/dL — AB (ref 13.0–17.0)
HEMOGLOBIN: 8.8 g/dL — AB (ref 13.0–17.0)
HEMOGLOBIN: 9.5 g/dL — AB (ref 13.0–17.0)
Hemoglobin: 12.2 g/dL — ABNORMAL LOW (ref 13.0–17.0)
Hemoglobin: 9.2 g/dL — ABNORMAL LOW (ref 13.0–17.0)
Hemoglobin: 9.5 g/dL — ABNORMAL LOW (ref 13.0–17.0)
Hemoglobin: 8.8 g/dL — ABNORMAL LOW (ref 13.0–17.0)
POTASSIUM: 4 mmol/L (ref 3.5–5.1)
POTASSIUM: 4.9 mmol/L (ref 3.5–5.1)
POTASSIUM: 4.8 mmol/L (ref 3.5–5.1)
POTASSIUM: 3.9 mmol/L (ref 3.5–5.1)
POTASSIUM: 3.9 mmol/L (ref 3.5–5.1)
Potassium: 4.1 mmol/L (ref 3.5–5.1)
Potassium: 3.9 mmol/L (ref 3.5–5.1)
SODIUM: 133 mmol/L — AB (ref 135–145)
SODIUM: 133 mmol/L — AB (ref 135–145)
SODIUM: 130 mmol/L — AB (ref 135–145)
SODIUM: 129 mmol/L — AB (ref 135–145)
Sodium: 129 mmol/L — ABNORMAL LOW (ref 135–145)
Sodium: 134 mmol/L — ABNORMAL LOW (ref 135–145)
Sodium: 134 mmol/L — ABNORMAL LOW (ref 135–145)
TCO2: 26 mmol/L (ref 0–100)
TCO2: 24 mmol/L (ref 0–100)
TCO2: 25 mmol/L (ref 0–100)
TCO2: 21 mmol/L (ref 0–100)
TCO2: 22 mmol/L (ref 0–100)
TCO2: 21 mmol/L (ref 0–100)
TCO2: 19 mmol/L (ref 0–100)

## 2014-10-13 LAB — CBC
HEMATOCRIT: 37.2 % — AB (ref 39.0–52.0)
HEMATOCRIT: 28.8 % — AB (ref 39.0–52.0)
HEMATOCRIT: 27.7 % — AB (ref 39.0–52.0)
HEMOGLOBIN: 9.4 g/dL — AB (ref 13.0–17.0)
Hemoglobin: 12.4 g/dL — ABNORMAL LOW (ref 13.0–17.0)
Hemoglobin: 9.6 g/dL — ABNORMAL LOW (ref 13.0–17.0)
MCH: 28.8 pg (ref 26.0–34.0)
MCH: 28.6 pg (ref 26.0–34.0)
MCH: 28.3 pg (ref 26.0–34.0)
MCHC: 33.3 g/dL (ref 30.0–36.0)
MCHC: 33.3 g/dL (ref 30.0–36.0)
MCHC: 33.9 g/dL (ref 30.0–36.0)
MCV: 86.5 fL (ref 78.0–100.0)
MCV: 85.7 fL (ref 78.0–100.0)
MCV: 83.4 fL (ref 78.0–100.0)
PLATELETS: 173 10*3/uL (ref 150–400)
Platelets: 188 10*3/uL (ref 150–400)
Platelets: 184 10*3/uL (ref 150–400)
RBC: 4.3 MIL/uL (ref 4.22–5.81)
RBC: 3.36 MIL/uL — ABNORMAL LOW (ref 4.22–5.81)
RBC: 3.32 MIL/uL — AB (ref 4.22–5.81)
RDW: 13 % (ref 11.5–15.5)
RDW: 12.8 % (ref 11.5–15.5)
RDW: 12.6 % (ref 11.5–15.5)
WBC: 11.9 10*3/uL — ABNORMAL HIGH (ref 4.0–10.5)
WBC: 28.6 10*3/uL — ABNORMAL HIGH (ref 4.0–10.5)
WBC: 21.8 10*3/uL — ABNORMAL HIGH (ref 4.0–10.5)

## 2014-10-13 LAB — POCT I-STAT 3, ART BLOOD GAS (G3+)
ACID-BASE DEFICIT: 5 mmol/L — AB (ref 0.0–2.0)
Acid-Base Excess: 2 mmol/L (ref 0.0–2.0)
Acid-base deficit: 1 mmol/L (ref 0.0–2.0)
Acid-base deficit: 4 mmol/L — ABNORMAL HIGH (ref 0.0–2.0)
BICARBONATE: 22.3 meq/L (ref 20.0–24.0)
Bicarbonate: 25.3 mEq/L — ABNORMAL HIGH (ref 20.0–24.0)
Bicarbonate: 22.7 mEq/L (ref 20.0–24.0)
Bicarbonate: 25.4 mEq/L — ABNORMAL HIGH (ref 20.0–24.0)
O2 SAT: 100 %
O2 Saturation: 100 %
O2 Saturation: 92 %
O2 Saturation: 100 %
PCO2 ART: 46.7 mmHg — AB (ref 35.0–45.0)
PH ART: 7.342 — AB (ref 7.350–7.450)
PH ART: 7.302 — AB (ref 7.350–7.450)
PO2 ART: 320 mmHg — AB (ref 80.0–100.0)
PO2 ART: 263 mmHg — AB (ref 80.0–100.0)
PO2 ART: 79 mmHg — AB (ref 80.0–100.0)
PO2 ART: 216 mmHg — AB (ref 80.0–100.0)
Patient temperature: 37.29
TCO2: 27 mmol/L (ref 0–100)
TCO2: 24 mmol/L (ref 0–100)
TCO2: 24 mmol/L (ref 0–100)
TCO2: 26 mmol/L (ref 0–100)
pCO2 arterial: 46 mmHg — ABNORMAL HIGH (ref 35.0–45.0)
pCO2 arterial: 54.1 mmHg — ABNORMAL HIGH (ref 35.0–45.0)
pCO2 arterial: 35.1 mmHg (ref 35.0–45.0)
pH, Arterial: 7.225 — ABNORMAL LOW (ref 7.350–7.450)
pH, Arterial: 7.467 — ABNORMAL HIGH (ref 7.350–7.450)

## 2014-10-13 LAB — HEMOGLOBIN AND HEMATOCRIT, BLOOD
HEMATOCRIT: 27.7 % — AB (ref 39.0–52.0)
HEMOGLOBIN: 9.2 g/dL — AB (ref 13.0–17.0)

## 2014-10-13 LAB — CREATININE, SERUM
CREATININE: 0.86 mg/dL (ref 0.50–1.35)
GFR calc non Af Amer: >90 mL/min (ref >90)

## 2014-10-13 LAB — PROTIME-INR
INR: 1.4 (ref 0.00–1.49)
PROTHROMBIN TIME: 17.3 s — AB (ref 11.6–15.2)

## 2014-10-13 LAB — CK TOTAL AND CKMB (NOT AT ARMC)
CK, MB: 5.2 ng/mL — ABNORMAL HIGH (ref 0.3–4.0)
RELATIVE INDEX: INVALID (ref 0.0–2.5)
Total CK: 58 U/L (ref 7–232)

## 2014-10-13 LAB — BASIC METABOLIC PANEL
ANION GAP: 10 (ref 5–15)
BUN: 12 mg/dL (ref 6–23)
CALCIUM: 8.4 mg/dL (ref 8.4–10.5)
CHLORIDE: 97 meq/L (ref 96–112)
CO2: 26 mmol/L (ref 19–32)
Creatinine, Ser: 0.9 mg/dL (ref 0.50–1.35)
GFR calc Af Amer: >90 mL/min (ref >90)
GFR calc non Af Amer: >90 mL/min (ref >90)
Glucose, Bld: 222 mg/dL — ABNORMAL HIGH (ref 70–99)
Potassium: 4.2 mmol/L (ref 3.5–5.1)
SODIUM: 133 mmol/L — AB (ref 135–145)

## 2014-10-13 LAB — POCT I-STAT 4, (NA,K, GLUC, HGB,HCT)
Glucose, Bld: 244 mg/dL — ABNORMAL HIGH (ref 70–99)
HCT: 31 % — ABNORMAL LOW (ref 39.0–52.0)
HEMOGLOBIN: 10.5 g/dL — AB (ref 13.0–17.0)
POTASSIUM: 3.8 mmol/L (ref 3.5–5.1)
Sodium: 135 mmol/L (ref 135–145)

## 2014-10-13 LAB — GLUCOSE, CAPILLARY: Glucose-Capillary: 178 mg/dL — ABNORMAL HIGH (ref 70–99)

## 2014-10-13 LAB — PLATELET COUNT: Platelets: 221 10*3/uL (ref 150–400)

## 2014-10-13 LAB — POCT ACTIVATED CLOTTING TIME: ACTIVATED CLOTTING TIME: 97 s

## 2014-10-13 LAB — SURGICAL PCR SCREEN
MRSA, PCR: NEGATIVE
Staphylococcus aureus: POSITIVE — AB

## 2014-10-13 LAB — APTT: APTT: 40 s — AB (ref 24–37)

## 2014-10-13 LAB — HEMOGLOBIN A1C
Hgb A1c MFr Bld: 9.8 % — ABNORMAL HIGH (ref <5.7)
Mean Plasma Glucose: 235 mg/dL — ABNORMAL HIGH (ref <117)

## 2014-10-13 LAB — PREALBUMIN: PREALBUMIN: 20.2 mg/dL (ref 17.0–34.0)

## 2014-10-13 LAB — TROPONIN I: Troponin I: 6.32 ng/mL (ref <0.031)

## 2014-10-13 LAB — POCT I-STAT GLUCOSE
GLUCOSE: 251 mg/dL — AB (ref 70–99)
Operator id: 195151

## 2014-10-13 LAB — MAGNESIUM: Magnesium: 2.5 mg/dL (ref 1.5–2.5)

## 2014-10-13 SURGERY — CORONARY ARTERY BYPASS GRAFTING (CABG)
Anesthesia: General | Site: Chest

## 2014-10-13 SURGERY — CORONARY ARTERY BYPASS GRAFTING (CABG)
Anesthesia: General

## 2014-10-13 MED ORDER — MILRINONE IN DEXTROSE 20 MG/100ML IV SOLN
0.2000 ug/kg/min | INTRAVENOUS | Status: DC
  Administered 2014-10-13: 0.3 ug/kg/min via INTRAVENOUS
  Administered 2014-10-14: 0.2 ug/kg/min via INTRAVENOUS
  Administered 2014-10-14: 0.3 ug/kg/min via INTRAVENOUS
  Filled 2014-10-13 (×3): qty 100

## 2014-10-13 MED ORDER — MORPHINE SULFATE 2 MG/ML IJ SOLN
1.0000 mg | INTRAMUSCULAR | Status: AC | PRN
  Administered 2014-10-13 – 2014-10-14 (×2): 2 mg via INTRAVENOUS
  Filled 2014-10-13 (×2): qty 1

## 2014-10-13 MED ORDER — MUPIROCIN 2 % EX OINT
1.0000 "application " | TOPICAL_OINTMENT | Freq: Two times a day (BID) | CUTANEOUS | Status: AC
  Administered 2014-10-13 – 2014-10-17 (×9): 1 via NASAL
  Filled 2014-10-13 (×2): qty 22

## 2014-10-13 MED ORDER — MIDAZOLAM HCL 10 MG/2ML IJ SOLN
INTRAMUSCULAR | Status: AC
  Filled 2014-10-13: qty 2

## 2014-10-13 MED ORDER — DOCUSATE SODIUM 100 MG PO CAPS
200.0000 mg | ORAL_CAPSULE | Freq: Every day | ORAL | Status: DC
  Administered 2014-10-15 – 2014-10-26 (×9): 200 mg via ORAL
  Filled 2014-10-13 (×12): qty 2

## 2014-10-13 MED ORDER — ROCURONIUM BROMIDE 50 MG/5ML IV SOLN
INTRAVENOUS | Status: AC
  Filled 2014-10-13: qty 1

## 2014-10-13 MED ORDER — POTASSIUM CHLORIDE 10 MEQ/50ML IV SOLN
10.0000 meq | INTRAVENOUS | Status: AC
  Administered 2014-10-13 (×3): 10 meq via INTRAVENOUS

## 2014-10-13 MED ORDER — SODIUM CHLORIDE 0.9 % IV SOLN
INTRAVENOUS | Status: DC
  Administered 2014-10-13: 15:00:00 via INTRAVENOUS

## 2014-10-13 MED ORDER — MIDAZOLAM HCL 2 MG/2ML IJ SOLN
INTRAMUSCULAR | Status: AC
  Filled 2014-10-13: qty 2

## 2014-10-13 MED ORDER — NITROGLYCERIN IN D5W 200-5 MCG/ML-% IV SOLN: 0.0000 ug/min | INTRAVENOUS | Status: DC

## 2014-10-13 MED ORDER — ACETAMINOPHEN 160 MG/5ML PO SOLN: 650.0000 mg | Freq: Once | ORAL | Status: AC

## 2014-10-13 MED ORDER — CHLORHEXIDINE GLUCONATE 0.12 % MT SOLN
15.0000 mL | Freq: Two times a day (BID) | OROMUCOSAL | Status: DC
  Administered 2014-10-13 – 2014-10-25 (×22): 15 mL via OROMUCOSAL
  Filled 2014-10-13 (×25): qty 15

## 2014-10-13 MED ORDER — DEXTROSE 5 % IV SOLN
1.5000 g | Freq: Two times a day (BID) | INTRAVENOUS | Status: AC
  Administered 2014-10-14 – 2014-10-15 (×4): 1.5 g via INTRAVENOUS
  Filled 2014-10-13 (×4): qty 1.5

## 2014-10-13 MED ORDER — ALBUMIN HUMAN 5 % IV SOLN
250.0000 mL | INTRAVENOUS | Status: AC | PRN
  Administered 2014-10-13 – 2014-10-14 (×2): 250 mL via INTRAVENOUS
  Filled 2014-10-13: qty 250

## 2014-10-13 MED ORDER — LIDOCAINE BOLUS VIA INFUSION
150.0000 mg | Freq: Once | INTRAVENOUS | Status: DC
  Filled 2014-10-13: qty 152

## 2014-10-13 MED ORDER — PROPOFOL 10 MG/ML IV BOLUS
INTRAVENOUS | Status: DC | PRN
  Administered 2014-10-13: 90 mg via INTRAVENOUS

## 2014-10-13 MED ORDER — MORPHINE SULFATE 2 MG/ML IJ SOLN
2.0000 mg | INTRAMUSCULAR | Status: DC | PRN
Start: 2014-10-13 — End: 2014-10-14
  Administered 2014-10-13 – 2014-10-14 (×2): 2 mg via INTRAVENOUS
  Filled 2014-10-13 (×2): qty 1

## 2014-10-13 MED ORDER — PANTOPRAZOLE SODIUM 40 MG PO TBEC
40.0000 mg | DELAYED_RELEASE_TABLET | Freq: Every day | ORAL | Status: DC
  Administered 2014-10-15 – 2014-10-26 (×11): 40 mg via ORAL
  Filled 2014-10-13 (×11): qty 1

## 2014-10-13 MED ORDER — MILRINONE IN DEXTROSE 20 MG/100ML IV SOLN
0.3750 ug/kg/min | INTRAVENOUS | Status: DC
Start: 2014-10-13 — End: 2014-10-13
  Filled 2014-10-13: qty 100

## 2014-10-13 MED ORDER — SUCCINYLCHOLINE CHLORIDE 20 MG/ML IJ SOLN
INTRAMUSCULAR | Status: DC | PRN
  Administered 2014-10-13: 140 mg via INTRAVENOUS

## 2014-10-13 MED ORDER — CALCIUM CHLORIDE 10 % IV SOLN
INTRAVENOUS | Status: DC | PRN
  Administered 2014-10-13: 1 g via INTRAVENOUS

## 2014-10-13 MED ORDER — CHLORHEXIDINE GLUCONATE 0.12 % MT SOLN
OROMUCOSAL | Status: AC
  Filled 2014-10-13: qty 15

## 2014-10-13 MED ORDER — ACETAMINOPHEN 160 MG/5ML PO SOLN
1000.0000 mg | Freq: Four times a day (QID) | ORAL | Status: DC
  Administered 2014-10-14 (×2): 1000 mg
  Filled 2014-10-13 (×2): qty 40.6

## 2014-10-13 MED ORDER — LIDOCAINE IN D5W 4-5 MG/ML-% IV SOLN: 2.0000 mg/min | INTRAVENOUS | Status: DC

## 2014-10-13 MED ORDER — MIDAZOLAM HCL 2 MG/2ML IJ SOLN
2.0000 mg | INTRAMUSCULAR | Status: DC | PRN
  Administered 2014-10-13: 2 mg via INTRAVENOUS
  Administered 2014-10-13 (×2): 1 mg via INTRAVENOUS
  Administered 2014-10-14: 2 mg via INTRAVENOUS
  Filled 2014-10-13 (×3): qty 2

## 2014-10-13 MED ORDER — BISACODYL 5 MG PO TBEC
10.0000 mg | DELAYED_RELEASE_TABLET | Freq: Every day | ORAL | Status: DC
  Administered 2014-10-15 – 2014-10-26 (×6): 10 mg via ORAL
  Filled 2014-10-13 (×6): qty 2

## 2014-10-13 MED ORDER — NOREPINEPHRINE BITARTRATE 1 MG/ML IV SOLN
4000.0000 ug | INTRAVENOUS | Status: DC | PRN
  Administered 2014-10-13: 5 ug/min via INTRAVENOUS
  Administered 2014-10-13: 10 ug/min via INTRAVENOUS

## 2014-10-13 MED ORDER — ONDANSETRON HCL 4 MG/2ML IJ SOLN
4.0000 mg | Freq: Four times a day (QID) | INTRAMUSCULAR | Status: DC | PRN
Start: 2014-10-13 — End: 2014-10-26

## 2014-10-13 MED ORDER — AMIODARONE HCL IN DEXTROSE 360-4.14 MG/200ML-% IV SOLN
30.0000 mg/h | INTRAVENOUS | Status: DC
  Administered 2014-10-14 – 2014-10-15 (×3): 30 mg/h via INTRAVENOUS
  Filled 2014-10-13 (×8): qty 200

## 2014-10-13 MED ORDER — CETYLPYRIDINIUM CHLORIDE 0.05 % MT LIQD
7.0000 mL | Freq: Two times a day (BID) | OROMUCOSAL | Status: DC
  Administered 2014-10-14 – 2014-10-21 (×16): 7 mL via OROMUCOSAL

## 2014-10-13 MED ORDER — FENTANYL CITRATE 0.05 MG/ML IJ SOLN
INTRAMUSCULAR | Status: AC
  Filled 2014-10-13: qty 5

## 2014-10-13 MED ORDER — DEXTROSE 5 % IV SOLN
0.2000 ug/kg/min | INTRAVENOUS | Status: AC
  Filled 2014-10-13: qty 25

## 2014-10-13 MED ORDER — HEPARIN SODIUM (PORCINE) 1000 UNIT/ML IJ SOLN
INTRAMUSCULAR | Status: AC
  Filled 2014-10-13: qty 1

## 2014-10-13 MED ORDER — OXYCODONE HCL 5 MG PO TABS
5.0000 mg | ORAL_TABLET | ORAL | Status: DC | PRN
  Administered 2014-10-14: 5 mg via ORAL
  Administered 2014-10-15 – 2014-10-16 (×5): 10 mg via ORAL
  Filled 2014-10-13 (×4): qty 2
  Filled 2014-10-13: qty 1
  Filled 2014-10-13: qty 2

## 2014-10-13 MED ORDER — SODIUM CHLORIDE 0.9 % IJ SOLN
INTRAMUSCULAR | Status: AC
  Filled 2014-10-13: qty 10

## 2014-10-13 MED ORDER — SODIUM CHLORIDE 0.9 % IJ SOLN: 3.0000 mL | INTRAMUSCULAR | Status: DC | PRN

## 2014-10-13 MED ORDER — VECURONIUM BROMIDE 10 MG IV SOLR
INTRAVENOUS | Status: AC
  Filled 2014-10-13: qty 10

## 2014-10-13 MED ORDER — SODIUM CHLORIDE 0.9 % IV SOLN
INTRAVENOUS | Status: AC
  Administered 2014-10-13: 19:00:00 via INTRAVENOUS

## 2014-10-13 MED ORDER — PROTAMINE SULFATE 10 MG/ML IV SOLN
INTRAVENOUS | Status: DC | PRN
  Administered 2014-10-13: 400 mg via INTRAVENOUS

## 2014-10-13 MED ORDER — ASPIRIN 81 MG PO CHEW: 324.0000 mg | CHEWABLE_TABLET | Freq: Every day | ORAL | Status: DC

## 2014-10-13 MED ORDER — SODIUM BICARBONATE 8.4 % IV SOLN
50.0000 meq | Freq: Once | INTRAVENOUS | Status: AC
  Administered 2014-10-13: 50 meq via INTRAVENOUS

## 2014-10-13 MED ORDER — CHLORHEXIDINE GLUCONATE CLOTH 2 % EX PADS: 6.0000 | MEDICATED_PAD | Freq: Every day | CUTANEOUS | Status: DC

## 2014-10-13 MED ORDER — VECURONIUM BROMIDE 10 MG IV SOLR
INTRAVENOUS | Status: AC
  Filled 2014-10-13: qty 20

## 2014-10-13 MED ORDER — STUDY - INVESTIGATIONAL DRUG SIMPLE RECORD
0.1000 ug/kg/min | Status: AC
  Administered 2014-10-13: .2 ug/kg/min via INTRAVENOUS
  Filled 2014-10-13: qty 25

## 2014-10-13 MED ORDER — ACETAMINOPHEN 500 MG PO TABS
1000.0000 mg | ORAL_TABLET | Freq: Four times a day (QID) | ORAL | Status: AC
  Administered 2014-10-14 – 2014-10-18 (×15): 1000 mg via ORAL
  Filled 2014-10-13 (×19): qty 2

## 2014-10-13 MED ORDER — FAMOTIDINE IN NACL 20-0.9 MG/50ML-% IV SOLN
20.0000 mg | Freq: Two times a day (BID) | INTRAVENOUS | Status: AC
  Administered 2014-10-13 (×2): 20 mg via INTRAVENOUS
  Filled 2014-10-13: qty 50

## 2014-10-13 MED ORDER — BISACODYL 10 MG RE SUPP: 10.0000 mg | Freq: Every day | RECTAL | Status: DC

## 2014-10-13 MED ORDER — METOPROLOL TARTRATE 25 MG/10 ML ORAL SUSPENSION
12.5000 mg | Freq: Two times a day (BID) | ORAL | Status: DC
  Filled 2014-10-13 (×3): qty 5

## 2014-10-13 MED ORDER — SODIUM CHLORIDE 0.9 % IV SOLN
INTRAVENOUS | Status: DC
  Administered 2014-10-13: 13.7 [IU]/h via INTRAVENOUS
  Filled 2014-10-13 (×2): qty 2.5

## 2014-10-13 MED ORDER — PROTAMINE SULFATE 10 MG/ML IV SOLN
INTRAVENOUS | Status: AC
  Filled 2014-10-13: qty 25

## 2014-10-13 MED ORDER — LACTATED RINGERS IV SOLN
INTRAVENOUS | Status: DC | PRN
  Administered 2014-10-13: 07:00:00 via INTRAVENOUS

## 2014-10-13 MED ORDER — SUCCINYLCHOLINE CHLORIDE 20 MG/ML IJ SOLN
INTRAMUSCULAR | Status: AC
  Filled 2014-10-13: qty 1

## 2014-10-13 MED ORDER — SODIUM CHLORIDE 0.45 % IV SOLN: INTRAVENOUS | Status: DC

## 2014-10-13 MED ORDER — PHENYLEPHRINE HCL 10 MG/ML IJ SOLN
0.0000 ug/min | INTRAVENOUS | Status: DC
  Filled 2014-10-13: qty 2

## 2014-10-13 MED ORDER — NOREPINEPHRINE BITARTRATE 1 MG/ML IV SOLN
0.0000 ug/min | INTRAVENOUS | Status: DC
  Administered 2014-10-13: 15 ug/min via INTRAVENOUS
  Administered 2014-10-13: 16 ug/min via INTRAVENOUS
  Filled 2014-10-13 (×5): qty 4

## 2014-10-13 MED ORDER — LACTATED RINGERS IV SOLN
INTRAVENOUS | Status: DC | PRN
  Administered 2014-10-13 (×2): via INTRAVENOUS

## 2014-10-13 MED ORDER — LIDOCAINE HCL (CARDIAC) 20 MG/ML IV SOLN
INTRAVENOUS | Status: AC
  Filled 2014-10-13: qty 5

## 2014-10-13 MED ORDER — DEXMEDETOMIDINE HCL IN NACL 200 MCG/50ML IV SOLN
INTRAVENOUS | Status: AC
  Filled 2014-10-13: qty 50

## 2014-10-13 MED ORDER — VECURONIUM BROMIDE 10 MG IV SOLR
INTRAVENOUS | Status: DC | PRN
  Administered 2014-10-13 (×4): 5 mg via INTRAVENOUS

## 2014-10-13 MED ORDER — METOPROLOL TARTRATE 1 MG/ML IV SOLN
2.5000 mg | INTRAVENOUS | Status: DC | PRN
  Administered 2014-10-16: 5 mg via INTRAVENOUS
  Filled 2014-10-13: qty 5

## 2014-10-13 MED ORDER — NOREPINEPHRINE BITARTRATE 1 MG/ML IV SOLN
0.0000 ug/min | INTRAVENOUS | Status: DC
  Filled 2014-10-13: qty 4

## 2014-10-13 MED ORDER — ACETAMINOPHEN 650 MG RE SUPP
650.0000 mg | Freq: Once | RECTAL | Status: AC
  Administered 2014-10-13: 650 mg via RECTAL

## 2014-10-13 MED ORDER — TRAMADOL HCL 50 MG PO TABS
50.0000 mg | ORAL_TABLET | ORAL | Status: DC | PRN
  Administered 2014-10-16 – 2014-10-23 (×7): 100 mg via ORAL
  Administered 2014-10-24: 50 mg via ORAL
  Administered 2014-10-24: 100 mg via ORAL
  Administered 2014-10-24: 50 mg via ORAL
  Filled 2014-10-13 (×3): qty 2
  Filled 2014-10-13: qty 1
  Filled 2014-10-13 (×5): qty 2
  Filled 2014-10-13: qty 1

## 2014-10-13 MED ORDER — LIDOCAINE HCL (CARDIAC) 20 MG/ML IV SOLN
INTRAVENOUS | Status: DC | PRN
  Administered 2014-10-13: 80 mg via INTRAVENOUS

## 2014-10-13 MED ORDER — ASPIRIN EC 325 MG PO TBEC
325.0000 mg | DELAYED_RELEASE_TABLET | Freq: Every day | ORAL | Status: DC
  Administered 2014-10-15: 325 mg via ORAL
  Filled 2014-10-13 (×3): qty 1

## 2014-10-13 MED ORDER — AMINOCAPROIC ACID 250 MG/ML IV SOLN
INTRAVENOUS | Status: DC | PRN
  Administered 2014-10-13: 5 g via INTRAVENOUS

## 2014-10-13 MED ORDER — AMIODARONE LOAD VIA INFUSION
150.0000 mg | Freq: Once | INTRAVENOUS | Status: AC
  Administered 2014-10-13: 150 mg via INTRAVENOUS
  Filled 2014-10-13: qty 83.34

## 2014-10-13 MED ORDER — DEXMEDETOMIDINE HCL IN NACL 200 MCG/50ML IV SOLN
0.0000 ug/kg/h | INTRAVENOUS | Status: DC
Start: 2014-10-13 — End: 2014-10-14
  Administered 2014-10-13 – 2014-10-14 (×9): 0.7 ug/kg/h via INTRAVENOUS
  Administered 2014-10-14: 0.6 ug/kg/h via INTRAVENOUS
  Filled 2014-10-13: qty 50
  Filled 2014-10-13: qty 100
  Filled 2014-10-13 (×3): qty 50
  Filled 2014-10-13: qty 100
  Filled 2014-10-13: qty 50

## 2014-10-13 MED ORDER — VANCOMYCIN HCL IN DEXTROSE 1-5 GM/200ML-% IV SOLN
1000.0000 mg | Freq: Once | INTRAVENOUS | Status: AC
  Administered 2014-10-13: 1000 mg via INTRAVENOUS
  Filled 2014-10-13: qty 200

## 2014-10-13 MED ORDER — AMIODARONE HCL IN DEXTROSE 360-4.14 MG/200ML-% IV SOLN
60.0000 mg/h | INTRAVENOUS | Status: AC
  Administered 2014-10-13 (×3): 60 mg/h via INTRAVENOUS

## 2014-10-13 MED ORDER — SODIUM CHLORIDE 0.9 % IV SOLN: 250.0000 mL | INTRAVENOUS | Status: DC

## 2014-10-13 MED ORDER — ARTIFICIAL TEARS OP OINT
TOPICAL_OINTMENT | OPHTHALMIC | Status: AC
  Filled 2014-10-13: qty 3.5

## 2014-10-13 MED ORDER — METOPROLOL TARTRATE 12.5 MG HALF TABLET
12.5000 mg | ORAL_TABLET | Freq: Two times a day (BID) | ORAL | Status: DC
  Administered 2014-10-14: 12.5 mg via ORAL
  Filled 2014-10-13 (×5): qty 1

## 2014-10-13 MED ORDER — HEPARIN SODIUM (PORCINE) 1000 UNIT/ML IJ SOLN
INTRAMUSCULAR | Status: DC | PRN
  Administered 2014-10-13: 42000 [IU] via INTRAVENOUS
  Administered 2014-10-13: 3000 [IU] via INTRAVENOUS

## 2014-10-13 MED ORDER — INSULIN DETEMIR 100 UNIT/ML ~~LOC~~ SOLN
50.0000 [IU] | SUBCUTANEOUS | Status: AC
  Administered 2014-10-13: 50 [IU] via SUBCUTANEOUS
  Filled 2014-10-13: qty 0.5

## 2014-10-13 MED ORDER — MAGNESIUM SULFATE 4 GM/100ML IV SOLN
4.0000 g | Freq: Once | INTRAVENOUS | Status: AC
  Administered 2014-10-13: 4 g via INTRAVENOUS
  Filled 2014-10-13: qty 100

## 2014-10-13 MED ORDER — ROCURONIUM BROMIDE 100 MG/10ML IV SOLN
INTRAVENOUS | Status: DC | PRN
  Administered 2014-10-13 (×2): 50 mg via INTRAVENOUS

## 2014-10-13 MED ORDER — 0.9 % SODIUM CHLORIDE (POUR BTL) OPTIME
TOPICAL | Status: DC | PRN
  Administered 2014-10-13: 6000 mL

## 2014-10-13 MED ORDER — FENTANYL CITRATE 0.05 MG/ML IJ SOLN
INTRAMUSCULAR | Status: DC | PRN
  Administered 2014-10-13: 100 ug via INTRAVENOUS
  Administered 2014-10-13: 250 ug via INTRAVENOUS
  Administered 2014-10-13: 50 ug via INTRAVENOUS
  Administered 2014-10-13: 250 ug via INTRAVENOUS
  Administered 2014-10-13 (×2): 150 ug via INTRAVENOUS
  Administered 2014-10-13: 100 ug via INTRAVENOUS
  Administered 2014-10-13 (×2): 50 ug via INTRAVENOUS
  Administered 2014-10-13: 100 ug via INTRAVENOUS

## 2014-10-13 MED ORDER — ARTIFICIAL TEARS OP OINT
TOPICAL_OINTMENT | OPHTHALMIC | Status: DC | PRN
  Administered 2014-10-13: 1 via OPHTHALMIC

## 2014-10-13 MED ORDER — ALBUMIN HUMAN 5 % IV SOLN
INTRAVENOUS | Status: DC | PRN
  Administered 2014-10-13 (×4): via INTRAVENOUS

## 2014-10-13 MED ORDER — PROPOFOL 10 MG/ML IV BOLUS
INTRAVENOUS | Status: AC
  Filled 2014-10-13: qty 20

## 2014-10-13 MED ORDER — SODIUM CHLORIDE 0.9 % IJ SOLN
OROMUCOSAL | Status: DC | PRN
  Administered 2014-10-13 (×3): 4 mL via TOPICAL

## 2014-10-13 MED ORDER — LIDOCAINE HCL (CARDIAC) 20 MG/ML IV SOLN
150.0000 mg | Freq: Once | INTRAVENOUS | Status: AC
  Administered 2014-10-13: 150 mg via INTRAVENOUS

## 2014-10-13 MED ORDER — MIDAZOLAM HCL 5 MG/5ML IJ SOLN
INTRAMUSCULAR | Status: DC | PRN
  Administered 2014-10-13: 4 mg via INTRAVENOUS
  Administered 2014-10-13: 1 mg via INTRAVENOUS
  Administered 2014-10-13 (×3): 2 mg via INTRAVENOUS
  Administered 2014-10-13: 3 mg via INTRAVENOUS
  Administered 2014-10-13: 1 mg via INTRAVENOUS
  Administered 2014-10-13: 5 mg via INTRAVENOUS
  Administered 2014-10-13: 1 mg via INTRAVENOUS
  Administered 2014-10-13: 3 mg via INTRAVENOUS
  Administered 2014-10-13: 2 mg via INTRAVENOUS

## 2014-10-13 MED ORDER — INSULIN REGULAR BOLUS VIA INFUSION
0.0000 [IU] | Freq: Three times a day (TID) | INTRAVENOUS | Status: DC
  Filled 2014-10-13: qty 10

## 2014-10-13 MED ORDER — HEPARIN SODIUM (PORCINE) 1000 UNIT/ML IJ SOLN
INTRAMUSCULAR | Status: AC
  Filled 2014-10-13: qty 2

## 2014-10-13 MED ORDER — LACTATED RINGERS IV SOLN: 500.0000 mL | Freq: Once | INTRAVENOUS | Status: AC | PRN

## 2014-10-13 MED ORDER — LACTATED RINGERS IV SOLN: INTRAVENOUS | Status: DC

## 2014-10-13 MED ORDER — SODIUM CHLORIDE 0.9 % IJ SOLN
3.0000 mL | Freq: Two times a day (BID) | INTRAMUSCULAR | Status: DC
  Administered 2014-10-14 – 2014-10-25 (×8): 3 mL via INTRAVENOUS

## 2014-10-13 MED FILL — Heparin Sodium (Porcine) Inj 1000 Unit/ML: INTRAMUSCULAR | Qty: 10 | Status: AC

## 2014-10-13 MED FILL — Sodium Bicarbonate IV Soln 8.4%: INTRAVENOUS | Qty: 50 | Status: AC

## 2014-10-13 MED FILL — Sodium Chloride IV Soln 0.9%: INTRAVENOUS | Qty: 2000 | Status: AC

## 2014-10-13 MED FILL — Mannitol IV Soln 20%: INTRAVENOUS | Qty: 500 | Status: AC

## 2014-10-13 MED FILL — Lidocaine HCl IV Inj 20 MG/ML: INTRAVENOUS | Qty: 5 | Status: AC

## 2014-10-13 MED FILL — Electrolyte-R (PH 7.4) Solution: INTRAVENOUS | Qty: 1000 | Status: AC

## 2014-10-13 SURGICAL SUPPLY — 135 items
ADAPTER CARDIO PERF ANTE/RETRO (ADAPTER) ×2 IMPLANT
APPLIER CLIP 9.375 MED OPEN (MISCELLANEOUS)
APPLIER CLIP 9.375 SM OPEN (CLIP) ×2
ATTRACTOMAT 16X20 MAGNETIC DRP (DRAPES) ×2 IMPLANT
BAG DECANTER FOR FLEXI CONT (MISCELLANEOUS) ×2 IMPLANT
BANDAGE ELASTIC 4 VELCRO ST LF (GAUZE/BANDAGES/DRESSINGS) ×4 IMPLANT
BANDAGE ELASTIC 6 VELCRO ST LF (GAUZE/BANDAGES/DRESSINGS) ×4 IMPLANT
BASKET HEART (ORDER IN 25'S) (MISCELLANEOUS) ×1
BASKET HEART (ORDER IN 25S) (MISCELLANEOUS) ×1 IMPLANT
BENZOIN TINCTURE PRP APPL 2/3 (GAUZE/BANDAGES/DRESSINGS) ×2 IMPLANT
BLADE STERNUM SYSTEM 6 (BLADE) ×4 IMPLANT
BLADE SURG ROTATE 9660 (MISCELLANEOUS) IMPLANT
BNDG GAUZE ELAST 4 BULKY (GAUZE/BANDAGES/DRESSINGS) ×4 IMPLANT
CANISTER SUCTION 2500CC (MISCELLANEOUS) ×2 IMPLANT
CANNULA EZ GLIDE 8.0 24FR (CANNULA) ×2 IMPLANT
CANNULA EZ GLIDE AORTIC 21FR (CANNULA) ×4 IMPLANT
CANNULA GUNDRY RCSP 15FR (MISCELLANEOUS) ×2 IMPLANT
CANNULA VENOUS LOW PROF 34X46 (CANNULA) ×2 IMPLANT
CANNULA VESSEL 3MM BLUNT TIP (CANNULA) ×2 IMPLANT
CARDIAC SUCTION (MISCELLANEOUS) ×2 IMPLANT
CATH CPB KIT OWEN (MISCELLANEOUS) ×2 IMPLANT
CATH THORACIC 28FR (CATHETERS) IMPLANT
CATH THORACIC 28FR RT ANG (CATHETERS) IMPLANT
CATH THORACIC 36FR (CATHETERS) ×2 IMPLANT
CATH THORACIC 36FR RT ANG (CATHETERS) ×2 IMPLANT
CLIP APPLIE 9.375 MED OPEN (MISCELLANEOUS) IMPLANT
CLIP APPLIE 9.375 SM OPEN (CLIP) ×1 IMPLANT
CLIP FOGARTY SPRING 6M (CLIP) ×2 IMPLANT
CLIP RETRACTION 3.0MM CORONARY (MISCELLANEOUS) ×2 IMPLANT
CLIP TI MEDIUM 24 (CLIP) IMPLANT
CLIP TI WIDE RED SMALL 24 (CLIP) ×6 IMPLANT
CONN ST 1/4X3/8  BEN (MISCELLANEOUS) ×2
CONN ST 1/4X3/8 BEN (MISCELLANEOUS) ×2 IMPLANT
CONN Y 3/8X3/8X3/8  BEN (MISCELLANEOUS)
CONN Y 3/8X3/8X3/8 BEN (MISCELLANEOUS) IMPLANT
COVER SURGICAL LIGHT HANDLE (MISCELLANEOUS) ×2 IMPLANT
CRADLE DONUT ADULT HEAD (MISCELLANEOUS) ×2 IMPLANT
DRAIN CHANNEL 32F RND 10.7 FF (WOUND CARE) ×2 IMPLANT
DRAPE CARDIOVASCULAR INCISE (DRAPES) ×2
DRAPE INCISE IOBAN 66X45 STRL (DRAPES) ×4 IMPLANT
DRAPE SLUSH/WARMER DISC (DRAPES) ×2 IMPLANT
DRAPE SRG 135X102X78XABS (DRAPES) ×2 IMPLANT
DRSG AQUACEL AG ADV 3.5X14 (GAUZE/BANDAGES/DRESSINGS) ×2 IMPLANT
DRSG COVADERM 4X14 (GAUZE/BANDAGES/DRESSINGS) ×2 IMPLANT
DRSG TEGADERM 2-3/8X2-3/4 SM (GAUZE/BANDAGES/DRESSINGS) ×4 IMPLANT
ELECT BLADE 4.0 EZ CLEAN MEGAD (MISCELLANEOUS) ×2
ELECT REM PT RETURN 9FT ADLT (ELECTROSURGICAL) ×4
ELECTRODE BLDE 4.0 EZ CLN MEGD (MISCELLANEOUS) ×1 IMPLANT
ELECTRODE REM PT RTRN 9FT ADLT (ELECTROSURGICAL) ×2 IMPLANT
GAUZE SPONGE 4X4 12PLY STRL (GAUZE/BANDAGES/DRESSINGS) ×6 IMPLANT
GLOVE BIO SURGEON STRL SZ 6 (GLOVE) IMPLANT
GLOVE BIO SURGEON STRL SZ 6.5 (GLOVE) ×6 IMPLANT
GLOVE BIO SURGEON STRL SZ7 (GLOVE) ×2 IMPLANT
GLOVE BIO SURGEON STRL SZ7.5 (GLOVE) ×8 IMPLANT
GLOVE BIO SURGEON STRL SZ8 (GLOVE) ×2 IMPLANT
GLOVE BIOGEL PI IND STRL 6 (GLOVE) IMPLANT
GLOVE BIOGEL PI IND STRL 6.5 (GLOVE) ×1 IMPLANT
GLOVE BIOGEL PI IND STRL 7.0 (GLOVE) ×3 IMPLANT
GLOVE BIOGEL PI IND STRL 7.5 (GLOVE) ×2 IMPLANT
GLOVE BIOGEL PI INDICATOR 6 (GLOVE)
GLOVE BIOGEL PI INDICATOR 6.5 (GLOVE) ×1
GLOVE BIOGEL PI INDICATOR 7.0 (GLOVE) ×3
GLOVE BIOGEL PI INDICATOR 7.5 (GLOVE) ×2
GLOVE EUDERMIC 7 POWDERFREE (GLOVE) IMPLANT
GLOVE ORTHO TXT STRL SZ7.5 (GLOVE) ×4 IMPLANT
GOWN STRL REUS W/ TWL LRG LVL3 (GOWN DISPOSABLE) ×10 IMPLANT
GOWN STRL REUS W/TWL LRG LVL3 (GOWN DISPOSABLE) ×10
HEMOSTAT POWDER SURGIFOAM 1G (HEMOSTASIS) ×6 IMPLANT
INSERT FOGARTY 61MM (MISCELLANEOUS) IMPLANT
INSERT FOGARTY XLG (MISCELLANEOUS) ×2 IMPLANT
KIT BASIN OR (CUSTOM PROCEDURE TRAY) ×2 IMPLANT
KIT ROOM TURNOVER OR (KITS) ×2 IMPLANT
KIT SUCTION CATH 14FR (SUCTIONS) ×10 IMPLANT
KIT VASOVIEW W/TROCAR VH 2000 (KITS) ×2 IMPLANT
LEAD PACING MYOCARDI (MISCELLANEOUS) ×2 IMPLANT
LIQUID BAND (GAUZE/BANDAGES/DRESSINGS) ×2 IMPLANT
MARKER GRAFT CORONARY BYPASS (MISCELLANEOUS) ×6 IMPLANT
NS IRRIG 1000ML POUR BTL (IV SOLUTION) ×10 IMPLANT
PACK OPEN HEART (CUSTOM PROCEDURE TRAY) ×2 IMPLANT
PAD ARMBOARD 7.5X6 YLW CONV (MISCELLANEOUS) ×2 IMPLANT
PAD ELECT DEFIB RADIOL ZOLL (MISCELLANEOUS) ×2 IMPLANT
PENCIL BUTTON HOLSTER BLD 10FT (ELECTRODE) ×2 IMPLANT
PUNCH AORTIC ROT 4.0MM RCL 40 (MISCELLANEOUS) ×2 IMPLANT
PUNCH AORTIC ROTATE 4.0MM (MISCELLANEOUS) IMPLANT
PUNCH AORTIC ROTATE 4.5MM 8IN (MISCELLANEOUS) IMPLANT
PUNCH AORTIC ROTATE 5MM 8IN (MISCELLANEOUS) IMPLANT
SET CARDIOPLEGIA MPS 5001102 (MISCELLANEOUS) ×2 IMPLANT
SOLUTION ANTI FOG 6CC (MISCELLANEOUS) IMPLANT
SPONGE LAP 18X18 X RAY DECT (DISPOSABLE) IMPLANT
SPONGE LAP 4X18 X RAY DECT (DISPOSABLE) ×2 IMPLANT
SUT BONE WAX W31G (SUTURE) ×2 IMPLANT
SUT ETHIBOND X763 2 0 SH 1 (SUTURE) ×4 IMPLANT
SUT ETHILON 3 0 FSL (SUTURE) ×2 IMPLANT
SUT MNCRL AB 3-0 PS2 18 (SUTURE) ×4 IMPLANT
SUT MNCRL AB 4-0 PS2 18 (SUTURE) IMPLANT
SUT PDS AB 1 CTX 36 (SUTURE) ×4 IMPLANT
SUT PROLENE 2 0 SH DA (SUTURE) IMPLANT
SUT PROLENE 3 0 SH DA (SUTURE) ×2 IMPLANT
SUT PROLENE 3 0 SH1 36 (SUTURE) IMPLANT
SUT PROLENE 4 0 RB 1 (SUTURE) ×1
SUT PROLENE 4 0 SH DA (SUTURE) ×4 IMPLANT
SUT PROLENE 4-0 RB1 .5 CRCL 36 (SUTURE) ×1 IMPLANT
SUT PROLENE 5 0 C 1 36 (SUTURE) IMPLANT
SUT PROLENE 6 0 C 1 30 (SUTURE) ×4 IMPLANT
SUT PROLENE 7.0 RB 3 (SUTURE) ×26 IMPLANT
SUT PROLENE 8 0 BV175 6 (SUTURE) ×4 IMPLANT
SUT PROLENE BLUE 7 0 (SUTURE) ×4 IMPLANT
SUT PROLENE POLY MONO (SUTURE) ×4 IMPLANT
SUT SILK  1 MH (SUTURE) ×1
SUT SILK 1 MH (SUTURE) ×1 IMPLANT
SUT STEEL 6MS V (SUTURE) IMPLANT
SUT STEEL STERNAL CCS#1 18IN (SUTURE) IMPLANT
SUT STEEL SZ 6 DBL 3X14 BALL (SUTURE) IMPLANT
SUT VIC AB 1 CTX 18 (SUTURE) ×2 IMPLANT
SUT VIC AB 1 CTX 36 (SUTURE)
SUT VIC AB 1 CTX36XBRD ANBCTR (SUTURE) IMPLANT
SUT VIC AB 2-0 CT1 27 (SUTURE) ×3
SUT VIC AB 2-0 CT1 TAPERPNT 27 (SUTURE) ×3 IMPLANT
SUT VIC AB 2-0 CTX 27 (SUTURE) ×4 IMPLANT
SUT VIC AB 3-0 SH 27 (SUTURE)
SUT VIC AB 3-0 SH 27X BRD (SUTURE) IMPLANT
SUT VIC AB 3-0 X1 27 (SUTURE) IMPLANT
SUT VICRYL 4-0 PS2 18IN ABS (SUTURE) IMPLANT
SUTURE E-PAK OPEN HEART (SUTURE) ×2 IMPLANT
SYSTEM SAHARA CHEST DRAIN ATS (WOUND CARE) ×2 IMPLANT
TAPE CLOTH SURG 4X10 WHT LF (GAUZE/BANDAGES/DRESSINGS) ×2 IMPLANT
TAPE PAPER 2X10 WHT MICROPORE (GAUZE/BANDAGES/DRESSINGS) ×2 IMPLANT
TOWEL OR 17X24 6PK STRL BLUE (TOWEL DISPOSABLE) ×4 IMPLANT
TOWEL OR 17X26 10 PK STRL BLUE (TOWEL DISPOSABLE) ×4 IMPLANT
TRAY FOLEY IC TEMP SENS 16FR (CATHETERS) ×2 IMPLANT
TUBE CONNECTING 12X1/4 (SUCTIONS) ×2 IMPLANT
TUBING INSUFFLATION (TUBING) ×2 IMPLANT
UNDERPAD 30X30 INCONTINENT (UNDERPADS AND DIAPERS) ×2 IMPLANT
WATER STERILE IRR 1000ML POUR (IV SOLUTION) ×4 IMPLANT
YANKAUER SUCT BULB TIP NO VENT (SUCTIONS) ×2 IMPLANT

## 2014-10-13 SURGICAL SUPPLY — 114 items
ADAPTER CARDIO PERF ANTE/RETRO (ADAPTER) IMPLANT
APPLIER CLIP 9.375 MED OPEN (MISCELLANEOUS)
APPLIER CLIP 9.375 SM OPEN (CLIP)
ATTRACTOMAT 16X20 MAGNETIC DRP (DRAPES) ×2 IMPLANT
BAG DECANTER FOR FLEXI CONT (MISCELLANEOUS) ×2 IMPLANT
BANDAGE ELASTIC 4 VELCRO ST LF (GAUZE/BANDAGES/DRESSINGS) ×2 IMPLANT
BANDAGE ELASTIC 6 VELCRO ST LF (GAUZE/BANDAGES/DRESSINGS) ×2 IMPLANT
BASKET HEART (ORDER IN 25'S) (MISCELLANEOUS) ×1
BASKET HEART (ORDER IN 25S) (MISCELLANEOUS) ×1 IMPLANT
BENZOIN TINCTURE PRP APPL 2/3 (GAUZE/BANDAGES/DRESSINGS) ×2 IMPLANT
BLADE STERNUM SYSTEM 6 (BLADE) ×2 IMPLANT
BLADE SURG ROTATE 9660 (MISCELLANEOUS) IMPLANT
BNDG GAUZE ELAST 4 BULKY (GAUZE/BANDAGES/DRESSINGS) ×2 IMPLANT
CANISTER SUCTION 2500CC (MISCELLANEOUS) ×2 IMPLANT
CANNULA EZ GLIDE AORTIC 21FR (CANNULA) ×4 IMPLANT
CANNULA GUNDRY RCSP 15FR (MISCELLANEOUS) IMPLANT
CANNULA VENOUS LOW PROF 34X46 (CANNULA) ×2 IMPLANT
CARDIAC SUCTION (MISCELLANEOUS) ×2 IMPLANT
CATH CPB KIT OWEN (MISCELLANEOUS) ×2 IMPLANT
CATH THORACIC 28FR (CATHETERS) IMPLANT
CATH THORACIC 28FR RT ANG (CATHETERS) IMPLANT
CATH THORACIC 36FR (CATHETERS) ×2 IMPLANT
CATH THORACIC 36FR RT ANG (CATHETERS) ×2 IMPLANT
CLIP APPLIE 9.375 MED OPEN (MISCELLANEOUS) IMPLANT
CLIP APPLIE 9.375 SM OPEN (CLIP) IMPLANT
CLIP FOGARTY SPRING 6M (CLIP) IMPLANT
CLIP TI MEDIUM 24 (CLIP) IMPLANT
CLIP TI WIDE RED SMALL 24 (CLIP) IMPLANT
CONN Y 3/8X3/8X3/8  BEN (MISCELLANEOUS)
CONN Y 3/8X3/8X3/8 BEN (MISCELLANEOUS) IMPLANT
COVER SURGICAL LIGHT HANDLE (MISCELLANEOUS) ×2 IMPLANT
CRADLE DONUT ADULT HEAD (MISCELLANEOUS) ×2 IMPLANT
DRAIN CHANNEL 32F RND 10.7 FF (WOUND CARE) ×2 IMPLANT
DRAPE CARDIOVASCULAR INCISE (DRAPES) ×1
DRAPE INCISE IOBAN 66X45 STRL (DRAPES) ×2 IMPLANT
DRAPE SLUSH/WARMER DISC (DRAPES) ×2 IMPLANT
DRAPE SRG 135X102X78XABS (DRAPES) ×1 IMPLANT
DRSG COVADERM 4X14 (GAUZE/BANDAGES/DRESSINGS) ×2 IMPLANT
ELECT REM PT RETURN 9FT ADLT (ELECTROSURGICAL) ×4
ELECTRODE REM PT RTRN 9FT ADLT (ELECTROSURGICAL) ×2 IMPLANT
GAUZE SPONGE 4X4 12PLY STRL (GAUZE/BANDAGES/DRESSINGS) ×4 IMPLANT
GLOVE BIO SURGEON STRL SZ 6 (GLOVE) IMPLANT
GLOVE BIO SURGEON STRL SZ 6.5 (GLOVE) IMPLANT
GLOVE BIO SURGEON STRL SZ7 (GLOVE) IMPLANT
GLOVE BIO SURGEON STRL SZ7.5 (GLOVE) IMPLANT
GLOVE BIOGEL PI IND STRL 6 (GLOVE) IMPLANT
GLOVE BIOGEL PI IND STRL 6.5 (GLOVE) IMPLANT
GLOVE BIOGEL PI IND STRL 7.0 (GLOVE) IMPLANT
GLOVE BIOGEL PI INDICATOR 6 (GLOVE)
GLOVE BIOGEL PI INDICATOR 6.5 (GLOVE)
GLOVE BIOGEL PI INDICATOR 7.0 (GLOVE)
GLOVE EUDERMIC 7 POWDERFREE (GLOVE) IMPLANT
GLOVE ORTHO TXT STRL SZ7.5 (GLOVE) ×4 IMPLANT
GOWN STRL REUS W/ TWL LRG LVL3 (GOWN DISPOSABLE) ×4 IMPLANT
GOWN STRL REUS W/TWL LRG LVL3 (GOWN DISPOSABLE) ×4
HEMOSTAT POWDER SURGIFOAM 1G (HEMOSTASIS) ×6 IMPLANT
INSERT FOGARTY 61MM (MISCELLANEOUS) IMPLANT
INSERT FOGARTY XLG (MISCELLANEOUS) ×2 IMPLANT
KIT BASIN OR (CUSTOM PROCEDURE TRAY) ×2 IMPLANT
KIT ROOM TURNOVER OR (KITS) ×2 IMPLANT
KIT SUCTION CATH 14FR (SUCTIONS) ×10 IMPLANT
KIT VASOVIEW W/TROCAR VH 2000 (KITS) ×2 IMPLANT
LEAD PACING MYOCARDI (MISCELLANEOUS) ×2 IMPLANT
MARKER GRAFT CORONARY BYPASS (MISCELLANEOUS) ×6 IMPLANT
NS IRRIG 1000ML POUR BTL (IV SOLUTION) ×10 IMPLANT
PACK OPEN HEART (CUSTOM PROCEDURE TRAY) ×2 IMPLANT
PAD ARMBOARD 7.5X6 YLW CONV (MISCELLANEOUS) ×2 IMPLANT
PAD ELECT DEFIB RADIOL ZOLL (MISCELLANEOUS) ×2 IMPLANT
PENCIL BUTTON HOLSTER BLD 10FT (ELECTRODE) ×2 IMPLANT
PUNCH AORTIC ROTATE 4.0MM (MISCELLANEOUS) IMPLANT
PUNCH AORTIC ROTATE 4.5MM 8IN (MISCELLANEOUS) IMPLANT
PUNCH AORTIC ROTATE 5MM 8IN (MISCELLANEOUS) IMPLANT
SOLUTION ANTI FOG 6CC (MISCELLANEOUS) IMPLANT
SPONGE LAP 18X18 X RAY DECT (DISPOSABLE) IMPLANT
SPONGE LAP 4X18 X RAY DECT (DISPOSABLE) IMPLANT
SUT BONE WAX W31G (SUTURE) ×2 IMPLANT
SUT ETHIBOND X763 2 0 SH 1 (SUTURE) ×4 IMPLANT
SUT MNCRL AB 3-0 PS2 18 (SUTURE) ×4 IMPLANT
SUT MNCRL AB 4-0 PS2 18 (SUTURE) IMPLANT
SUT PDS AB 1 CTX 36 (SUTURE) ×4 IMPLANT
SUT PROLENE 2 0 SH DA (SUTURE) IMPLANT
SUT PROLENE 3 0 SH DA (SUTURE) ×2 IMPLANT
SUT PROLENE 3 0 SH1 36 (SUTURE) IMPLANT
SUT PROLENE 4 0 RB 1 (SUTURE)
SUT PROLENE 4 0 SH DA (SUTURE) IMPLANT
SUT PROLENE 4-0 RB1 .5 CRCL 36 (SUTURE) IMPLANT
SUT PROLENE 5 0 C 1 36 (SUTURE) IMPLANT
SUT PROLENE 6 0 C 1 30 (SUTURE) IMPLANT
SUT PROLENE 7.0 RB 3 (SUTURE) ×6 IMPLANT
SUT PROLENE 8 0 BV175 6 (SUTURE) IMPLANT
SUT PROLENE BLUE 7 0 (SUTURE) ×2 IMPLANT
SUT PROLENE POLY MONO (SUTURE) IMPLANT
SUT SILK  1 MH (SUTURE) ×1
SUT SILK 1 MH (SUTURE) ×1 IMPLANT
SUT STEEL 6MS V (SUTURE) IMPLANT
SUT STEEL STERNAL CCS#1 18IN (SUTURE) IMPLANT
SUT STEEL SZ 6 DBL 3X14 BALL (SUTURE) IMPLANT
SUT VIC AB 1 CTX 36 (SUTURE)
SUT VIC AB 1 CTX36XBRD ANBCTR (SUTURE) IMPLANT
SUT VIC AB 2-0 CT1 27 (SUTURE)
SUT VIC AB 2-0 CT1 TAPERPNT 27 (SUTURE) IMPLANT
SUT VIC AB 2-0 CTX 27 (SUTURE) IMPLANT
SUT VIC AB 3-0 SH 27 (SUTURE)
SUT VIC AB 3-0 SH 27X BRD (SUTURE) IMPLANT
SUT VIC AB 3-0 X1 27 (SUTURE) IMPLANT
SUT VICRYL 4-0 PS2 18IN ABS (SUTURE) IMPLANT
SUTURE E-PAK OPEN HEART (SUTURE) ×2 IMPLANT
SYSTEM SAHARA CHEST DRAIN ATS (WOUND CARE) ×2 IMPLANT
TOWEL OR 17X24 6PK STRL BLUE (TOWEL DISPOSABLE) ×4 IMPLANT
TOWEL OR 17X26 10 PK STRL BLUE (TOWEL DISPOSABLE) ×4 IMPLANT
TRAY FOLEY IC TEMP SENS 16FR (CATHETERS) ×2 IMPLANT
TUBING INSUFFLATION (TUBING) ×2 IMPLANT
UNDERPAD 30X30 INCONTINENT (UNDERPADS AND DIAPERS) ×2 IMPLANT
WATER STERILE IRR 1000ML POUR (IV SOLUTION) ×4 IMPLANT

## 2014-10-13 NOTE — Transfer of Care (Signed)
Immediate Anesthesia Transfer of Care Note  Patient: Mark Leach  Procedure(s) Performed: Procedure(s): CORONARY ARTERY BYPASS GRAFTING (CABG), ON PUMP, TIMES FOUR, USING LEFT INTERNAL MAMMARY ARTERY, BILATERAL GREATER SAPHENOUS VEINS HARVESTED ENDOSCOPICALLY (N/A)  Patient Location: SICU  Anesthesia Type:General  Level of Consciousness: sedated and Patient remains intubated per anesthesia plan  Airway & Oxygen Therapy: Patient remains intubated per anesthesia plan and Patient placed on Ventilator (see vital sign flow sheet for setting)  Post-op Assessment: Report to SICU RN  Post vital signs: Reviewed and stable  Complications: No apparent anesthesia complications

## 2014-10-13 NOTE — Progress Notes (Signed)
Patient continues to have frequent runs of VTACH (4-12 on average), BP are soft and when patient has runs, pulse is present but thready, paged Dr. Cornelius Moras, received order for Lidocaine  IV bolus.  Will administer and monitor patient

## 2014-10-13 NOTE — Anesthesia Postprocedure Evaluation (Signed)
  Anesthesia Post-op Note  Patient: Mark Leach  Procedure(s) Performed: Procedure(s): CORONARY ARTERY BYPASS GRAFTING (CABG), ON PUMP, TIMES FOUR, USING LEFT INTERNAL MAMMARY ARTERY, BILATERAL GREATER SAPHENOUS VEINS HARVESTED ENDOSCOPICALLY (N/A)  Patient Location: PACU and SICU  Anesthesia Type:General  Level of Consciousness: sedated  Airway and Oxygen Therapy: Patient remains intubated per anesthesia plan  Post-op Pain: mild  Post-op Assessment: Post-op Vital signs reviewed  Post-op Vital Signs: Reviewed  Last Vitals:  Filed Vitals:   10/13/14 0600  BP: 129/67  Pulse: 93  Temp:   Resp: 23    Complications: No apparent anesthesia complications

## 2014-10-13 NOTE — Progress Notes (Signed)
Patients CBG 273, spoke with MD Tresa Endo and orders received. Ivery Quale, RN

## 2014-10-13 NOTE — OR Nursing (Signed)
First call to SICU at 1321

## 2014-10-13 NOTE — Op Note (Signed)
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 Cornelius Moras, MD  Assistant: Rowe Clack, PA-C  Anesthesia: Judie Petit, MD  Operative Findings:  Severe LV dysfunction w/ evidence of both remote and recent anterior wall myocardial infarction, EF 25%  Good quality LIMA conduit for grafting  Good quality SVG conduit for grafting  Diffuse CAD w/ poor quality LAD and Diagonal branch target vessels for grafting  Fair to good quality PDA and OM branch target vessels for grafting  Proximal end of SVG to OM branch sewn to proximal end of SVG to Diagonal Branch due to insufficient length of conduit    BRIEF CLINICAL NOTE AND INDICATIONS FOR SURGERY  Patient is a 44 year old morbidly obese white male with known history of coronary artery disease status post acute anterior wall myocardial infarction in 2014, ischemic cardiomyopathy with chronic combined systolic and diastolic congestive heart failure, hypertension, poorly controlled type 2 diabetes mellitus with complications, remote history of tobacco use, and a strong family history of coronary artery disease who has been referred for possible coronary artery bypass grafting. The patient has been morbidly obese for the  majority of his life. He suffered an acute anterior wall myocardial infarction in April 2014. He was treated at [facility] in [place] at that time where he underwent cardiac catheterization with PCI and stenting of the left anterior descending coronary artery. He was followed briefly by a cardiologist who subsequently discharged him from follow-up and told him to seek long-term care with a primary care physician. The patient has not been seen in follow-up by any physician until October of this year when he was hospitalized at [facility] with sinusitis and upper respiratory tract infection. He was diagnosed with type 2 diabetes mellitus and hypertension at that time, and started on oral metformin and lisinopril. He remained in his usual state of health with a persistent cough until several days prior to this admission when he suddenly began to experience resting substernal chest tightness and shortness of breath. Symptoms waxed and waned in severity, ultimately causing him to present to the ED at [facility] on 10/10/2013. He was diagnosed with acute myocardial infarction and transferred via EMS to [facility] for further therapy. At the time of admission he was noted to have EKG changes consistent with previous anterior wall myocardial infarction with possible ongoing recurrent ST segment elevation myocardial infarction. Symptoms of chest pain and shortness of breath had nearly completely resolved. He was taken to the cardiac cath lab by Dr. Tresa Endo where he was found have subtotal occlusion of the ostial left anterior descending coronary artery followed by chronic occlusion of the proximal left anterior descending coronary artery with severe three-vessel coronary artery disease and severe left ventricular dysfunction.  The patient has remained clinically stable since cath on intravenous nitroglycerin, heparin, and Aggrastat. Elective cardiothoracic surgical consultation was  requested this morning.  The patient has been seen in consultation and counseled at length regarding the indications, risks and potential benefits of surgery.  All questions have been answered, and the patient provides full informed consent for the operation as described.    DETAILS OF THE OPERATIVE PROCEDURE  Preparation:  The patient is brought to the operating room on the above mentioned date and central monitoring was established by the anesthesia team including placement of Swan-Ganz catheter and radial arterial line. The patient is placed in the supine position on the operating table.  Intravenous antibiotics are administered. General endotracheal anesthesia is induced uneventfully. A Foley catheter is placed.  Baseline transesophageal echocardiogram was performed.  Findings were notable for severe LV systolic dysfunction with severe hypokinesis or akinesis of the distal anterior wall and septum.  Ejection fraction was estimated 25-30%. There was mild mitral regurgitation and trace aortic insufficiency.  The patient's chest, abdomen, both groins, and both lower extremities are prepared and draped in a sterile manner. A time out procedure is performed.   Surgical Approach and Conduit Harvest:  A median sternotomy incision was performed and the left internal mammary artery is dissected from the chest wall and prepared for bypass grafting. The left internal mammary artery is notably good quality conduit. Simultaneously, greater saphenous vein is obtained from the patient's right thigh using endoscopic vein harvest technique. An additional segment of greater saphenous vein is removed from the left thigh using endoscopic vein harvest technique.  The saphenous veins are notably good quality conduit.   After removal of the saphenous vein, the small surgical incisions in the lower extremities are closed with absorbable suture. Following systemic heparinization, the left internal mammary artery was  transected distally noted to have excellent flow.   Extracorporeal Cardiopulmonary Bypass and Myocardial Protection:  The pericardium is opened. The ascending aorta is normal in appearance. The ascending aorta and the right atrium are cannulated for cardioplegia bypass.  Adequate heparinization is verified.    A retrograde cardioplegia cannula is placed through the right atrium into the coronary sinus.    The entire pre-bypass portion of the operation was notable for stable hemodynamics, although the patient did develop acute ST segment elevation suggestive of ongoing ischemia.  Cardiopulmonary bypass was begun and the surface of the heart is inspected. Distal target vessels are selected for coronary artery bypass grafting.  There is evidence for both remote and recent anterior wall myocardial infarction with scarring and thinning of the left ventricular wall throughout the distribution of the large diagonal branch as well as dusky myocardium with evidence for recent myocardial infarction in the surrounding area.  There was diffuse coronary artery disease. The diagonal branch appeared chronically occluded.  A cardioplegia cannula is placed in the ascending aorta.  A temperature probe was placed in the interventricular septum.  The patient is allowed to cool passively to [facility] systemic temperature.  The aortic cross clamp is applied and cold blood cardioplegia is delivered initially in an antegrade fashion through the aortic root.  Supplemental cardioplegia is given retrograde through the coronary sinus catheter.  Iced saline slush is applied for topical hypothermia.  The initial cardioplegic arrest is rapid with early diastolic arrest.  Repeat doses of cardioplegia are administered intermittently throughout the entire cross clamp portion of the operation through the aortic root, through the coronary sinus catheter, and through subsequently placed vein grafts in order to maintain completely flat  electrocardiogram and septal myocardial temperature below 15C.  Myocardial protection was felt  to be excellent.  Coronary Artery Bypass Grafting:   The  posterior descending branch of the right coronary artery was grafted using a reversed saphenous vein graft in an end-to-side fashion.  At the site of distal anastomosis the target vessel was good quality and measured approximately 2.0 mm in diameter.  The obtuse marginal branch of the left circumflex coronary artery was grafted using a reversed saphenous vein graft in an end-to-side fashion.  At the site of distal anastomosis the target vessel was good quality and measured approximately 2.0 mm in diameter.  The diagonal branch of the left anterior descending coronary artery was grafted using a reversed saphenous vein graft in an end-to-side fashion.  At the site of distal anastomosis the target vessel was poor quality, chronically occluded proximally and measured approximately 1.0 mm in diameter.  The distal left anterior coronary artery was grafted with the left internal mammary artery in an end-to-side fashion.  At the site of distal anastomosis the target vessel was poor quality and measured approximately 1.0 mm in diameter.  All proximal vein graft anastomoses were placed directly to the ascending aorta prior to removal of the aortic cross clamp.  The septal myocardial temperature rose rapidly after reperfusion of the left internal mammary artery graft.  The aortic cross clamp was removed after a total cross clamp time of 105 minutes.   Procedure Completion:  All proximal and distal coronary anastomoses were inspected for hemostasis and appropriate graft orientation. Epicardial pacing wires are fixed to the right ventricular outflow tract and to the right atrial appendage. The patient is rewarmed to 37C temperature. The patient is weaned and disconnected from cardiopulmonary bypass.  The patient's rhythm at separation from bypass was sinus.   The patient was weaned from cardiopulmonary bypass on low dose milrinone and levophed infusions. Total cardiopulmonary bypass time for the operation was 136 minutes.    At this juncture the it became apparent that the saphenous vein graft to the obtuse marginal branch was under some tension and slightly too short once the heart was completely filled. Cardiopulmonary bypass was resumed and the proximal end of the vein graft to the obtuse marginal branch was amputated from the aorta and sewn in an end to side fashion onto the proximal end of the vein graft to the diagonal branch. The patient was subsequently weaned from cardiopulmonary bypass a second time after an additional period of 17 minutes such that the total cardiopulmonary bypass time for the entire procedure was 153 minutes.  Followup transesophageal echocardiogram performed after separation from bypass revealed no changes from the preoperative exam.  The aortic and venous cannula were removed uneventfully. Protamine was administered to reverse the anticoagulation. The mediastinum and pleural space were inspected for hemostasis and irrigated with saline solution. The mediastinum and left pleural space were drained using 3 chest tubes placed through separate stab incisions inferiorly.  The soft tissues anterior to the aorta were reapproximated loosely. The sternum is closed with double strength sternal wire. The soft tissues anterior to the sternum were closed in multiple layers and the skin is closed with a running subcuticular skin closure.  The post-bypass portion of the operation was notable for stable rhythm and hemodynamics.  No blood products were administered during the operation.   Disposition:  The patient tolerated the procedure well and is transported to the surgical intensive care in stable condition. There are no intraoperative complications. All sponge instrument and needle counts are verified correct at completion of the  operation.  Salvatore Decent. Cornelius Moras MD 10/13/2014 2:12 PM

## 2014-10-13 NOTE — Brief Op Note (Addendum)
10/10/2014 - 10/13/2014      [address]       [place] [address]             [phone]     10/10/2014 - 10/13/2014  12:16 PM  PATIENT:  Mark Leach  44 y.o. male  PRE-OPERATIVE DIAGNOSIS:  CAD  POST-OPERATIVE DIAGNOSIS:  CORONARY ARTERY DISEASE  PROCEDURE:  Procedure(s):CABGX4 LIMA-LAD; SVG-DIAG; SVG-OM;SVG-PDA TRANSESOPHAGEAL ECHOCARDIOGRAPHY(TEE) EVH BILAT THIGH  SURGEON:    Purcell Nails, MD  ASSISTANTS:  Rowe Clack, PA-C  ANESTHESIA:   Judie Petit, MD  CROSSCLAMP TIME:   105'  CARDIOPULMONARY BYPASS TIME: 153'  FINDINGS:  Severe LV dysfunction w/ evidence of both remote and recent anterior wall myocardial infarction, EF 25%  Good quality LIMA conduit for grafting  Good quality SVG conduit for grafting  Diffuse CAD w/ poor quality LAD and Diagonal branch target vessels for grafting  Fair to good quality PDA and OM branch target vessels for grafting  COMPLICATIONS: None  BASELINE WEIGHT: 150 kg  PATIENT DISPOSITION:   TO SICU IN STABLE CONDITION  OWEN,CLARENCE H 10/13/2014 2:08 PM

## 2014-10-13 NOTE — Progress Notes (Signed)
Per report from Dr. Chilton Si from Radiology, ETT in R mainstem and needs to be pulled back 4cm.  Gilman Buttner, RT at bedside and adjusted ETT.  Repeat CXR ordered.

## 2014-10-13 NOTE — Progress Notes (Signed)
At 1738 Patient had what appeared to run of [facility] on monitor and SBP did drop into the, LEVO was increased to from , lidocaine was given, albumin x 1 was also given. CI did drop to 2 from (initially was 3)  Patient is still having runs of [facility] and frequent PVCS, paged MD on call and updated him.  MD at bedside to see patient.  Informed MD that several EKGs were done that showed rhythm to be ST (HR since [facility] run at 1738 has been in the 120-130s).  Levo drip was increased to help maintain SBP.  Will monitor and wean drip and wean patient on ventilator once patient is stable.  Currents drips: LEVO MIL 0.4mcg AMIO 30 PRECEDEX 0.7 INSULIN TRIAL STUDY at 9cc/hr per trial protocol

## 2014-10-13 NOTE — Progress Notes (Signed)
Utilization Review Completed.Mark Leach T1/01/2015  

## 2014-10-13 NOTE — Anesthesia Preprocedure Evaluation (Addendum)
Anesthesia Evaluation  Patient identified by MRN, date of birth, ID band Patient awake    Reviewed: Allergy & Precautions, H&P , NPO status , Patient's Chart, lab work & pertinent test results, reviewed documented beta blocker date and time   History of Anesthesia Complications Negative for: history of anesthetic complications  Airway Mallampati: II  TM Distance: >3 FB Neck ROM: Full    Dental  (+) Dental Advisory Given, Poor Dentition, Loose, Chipped,    Pulmonary former smoker,          Cardiovascular hypertension, Pt. on medications + CAD, + Past MI and +CHF     Neuro/Psych CVA negative neurological ROS  negative psych ROS   GI/Hepatic Neg liver ROS,   Endo/Other  diabetes, Type 2, Oral Hypoglycemic AgentsMorbid obesity  Renal/GU negative Renal ROS     Musculoskeletal   Abdominal   Peds  Hematology   Anesthesia Other Findings Very poor dentition several chipped and broken teeth. Pt states there are at least 3 loose on the bottom.tb  Reproductive/Obstetrics                          Anesthesia Physical Anesthesia Plan  ASA: III  Anesthesia Plan: General   Post-op Pain Management:    Induction: Intravenous  Airway Management Planned: Oral ETT  Additional Equipment: Arterial line, PA Cath and TEE  Intra-op Plan:   Post-operative Plan: Post-operative intubation/ventilation  Informed Consent: I have reviewed the patients History and Physical, chart, labs and discussed the procedure including the risks, benefits and alternatives for the proposed anesthesia with the patient or authorized representative who has indicated his/her understanding and acceptance.     Plan Discussed with: CRNA and Anesthesiologist  Anesthesia Plan Comments:         Anesthesia Quick Evaluation

## 2014-10-13 NOTE — Progress Notes (Signed)
Per telephone order from Dr Chilton Si (radiology), pull ETT back 4 cm. ETT now placed at 19 cm at the lip. 2nd CXR pending for verification.

## 2014-10-13 NOTE — OR Nursing (Signed)
Second call to SICU 

## 2014-10-13 NOTE — Progress Notes (Signed)
Patient had 55 beat run v tach.  ABG results: pH=7.23, pCO2=54.1, pO2=79, Bicarb=22.3, sat=92.  Labs obtained; K=3.8 and being replaced via IV.  Dr. Cornelius Moras made aware; 1 amp bicarb given, amio bolus and drip started and TV increased per orders.  Patient had second long run of v tach.  Dr. Cornelius Moras at bedside to assess patient.  Currently, NSR with rate 96. Will continue to monitor.

## 2014-10-13 NOTE — Progress Notes (Signed)
Echocardiogram Echocardiogram Transesophageal has been performed.  Mark Leach 10/13/2014, 8:41 AM

## 2014-10-13 NOTE — Progress Notes (Signed)
Patient ID: Mark Leach, male   DOB: Dec 16, 1970, 44 y.o.   MRN: [id number]  TCTS Evening Rounds:  He has been hemodynamically labile postop and is on levophed at 18 mcg, milrinone 0.3 and the study drug.  He had some VT and received lidocaine. He is on amiodarone drip now. At times his rhythm is sinus with narrow complex in the 80's and at other times wide complex tachy at 120 with RBBB.   CI 2.1.  PAD 20-22  Urine output ok  CT output low.  Remains on vent but has awakened and following command.

## 2014-10-14 ENCOUNTER — Encounter (HOSPITAL_COMMUNITY): Payer: Self-pay | Admitting: Thoracic Surgery (Cardiothoracic Vascular Surgery)

## 2014-10-14 ENCOUNTER — Inpatient Hospital Stay (HOSPITAL_COMMUNITY): Payer: 59

## 2014-10-14 DIAGNOSIS — Z951 Presence of aortocoronary bypass graft: Secondary | ICD-10-CM

## 2014-10-14 LAB — GLUCOSE, CAPILLARY
GLUCOSE-CAPILLARY: 101 mg/dL — AB (ref 70–99)
GLUCOSE-CAPILLARY: 102 mg/dL — AB (ref 70–99)
GLUCOSE-CAPILLARY: 103 mg/dL — AB (ref 70–99)
GLUCOSE-CAPILLARY: 106 mg/dL — AB (ref 70–99)
GLUCOSE-CAPILLARY: 112 mg/dL — AB (ref 70–99)
GLUCOSE-CAPILLARY: 116 mg/dL — AB (ref 70–99)
GLUCOSE-CAPILLARY: 116 mg/dL — AB (ref 70–99)
GLUCOSE-CAPILLARY: 119 mg/dL — AB (ref 70–99)
GLUCOSE-CAPILLARY: 131 mg/dL — AB (ref 70–99)
GLUCOSE-CAPILLARY: 137 mg/dL — AB (ref 70–99)
GLUCOSE-CAPILLARY: 144 mg/dL — AB (ref 70–99)
GLUCOSE-CAPILLARY: 150 mg/dL — AB (ref 70–99)
GLUCOSE-CAPILLARY: 158 mg/dL — AB (ref 70–99)
GLUCOSE-CAPILLARY: 159 mg/dL — AB (ref 70–99)
GLUCOSE-CAPILLARY: 164 mg/dL — AB (ref 70–99)
GLUCOSE-CAPILLARY: 190 mg/dL — AB (ref 70–99)
GLUCOSE-CAPILLARY: 207 mg/dL — AB (ref 70–99)
Glucose-Capillary: 195 mg/dL — ABNORMAL HIGH (ref 70–99)
Glucose-Capillary: 167 mg/dL — ABNORMAL HIGH (ref 70–99)
Glucose-Capillary: 131 mg/dL — ABNORMAL HIGH (ref 70–99)
Glucose-Capillary: 119 mg/dL — ABNORMAL HIGH (ref 70–99)
Glucose-Capillary: 112 mg/dL — ABNORMAL HIGH (ref 70–99)
Glucose-Capillary: 109 mg/dL — ABNORMAL HIGH (ref 70–99)
Glucose-Capillary: 103 mg/dL — ABNORMAL HIGH (ref 70–99)

## 2014-10-14 LAB — CBC
HCT: 26.6 % — ABNORMAL LOW (ref 39.0–52.0)
HEMATOCRIT: 28.6 % — AB (ref 39.0–52.0)
Hemoglobin: 9.3 g/dL — ABNORMAL LOW (ref 13.0–17.0)
Hemoglobin: 9.7 g/dL — ABNORMAL LOW (ref 13.0–17.0)
MCH: 29.7 pg (ref 26.0–34.0)
MCH: 28.8 pg (ref 26.0–34.0)
MCHC: 35 g/dL (ref 30.0–36.0)
MCHC: 33.9 g/dL (ref 30.0–36.0)
MCV: 85 fL (ref 78.0–100.0)
MCV: 84.9 fL (ref 78.0–100.0)
PLATELETS: 169 10*3/uL (ref 150–400)
Platelets: 162 10*3/uL (ref 150–400)
RBC: 3.13 MIL/uL — ABNORMAL LOW (ref 4.22–5.81)
RBC: 3.37 MIL/uL — ABNORMAL LOW (ref 4.22–5.81)
RDW: 12.9 % (ref 11.5–15.5)
RDW: 13.3 % (ref 11.5–15.5)
WBC: 15.3 10*3/uL — ABNORMAL HIGH (ref 4.0–10.5)
WBC: 18.3 10*3/uL — ABNORMAL HIGH (ref 4.0–10.5)

## 2014-10-14 LAB — POCT I-STAT 3, ART BLOOD GAS (G3+)
ACID-BASE DEFICIT: 4 mmol/L — AB (ref 0.0–2.0)
Acid-base deficit: 1 mmol/L (ref 0.0–2.0)
Acid-base deficit: 1 mmol/L (ref 0.0–2.0)
Bicarbonate: 22.1 mEq/L (ref 20.0–24.0)
Bicarbonate: 22.6 mEq/L (ref 20.0–24.0)
Bicarbonate: 19.8 mEq/L — ABNORMAL LOW (ref 20.0–24.0)
O2 SAT: 97 %
O2 Saturation: 99 %
O2 Saturation: 94 %
PH ART: 7.478 — AB (ref 7.350–7.450)
Patient temperature: 38
TCO2: 23 mmol/L (ref 0–100)
TCO2: 24 mmol/L (ref 0–100)
TCO2: 21 mmol/L (ref 0–100)
pCO2 arterial: 30 mmHg — ABNORMAL LOW (ref 35.0–45.0)
pCO2 arterial: 34.4 mmHg — ABNORMAL LOW (ref 35.0–45.0)
pCO2 arterial: 31.8 mmHg — ABNORMAL LOW (ref 35.0–45.0)
pH, Arterial: 7.429 (ref 7.350–7.450)
pH, Arterial: 7.402 (ref 7.350–7.450)
pO2, Arterial: 130 mmHg — ABNORMAL HIGH (ref 80.0–100.0)
pO2, Arterial: 95 mmHg (ref 80.0–100.0)
pO2, Arterial: 71 mmHg — ABNORMAL LOW (ref 80.0–100.0)

## 2014-10-14 LAB — BASIC METABOLIC PANEL
Anion gap: 6 (ref 5–15)
BUN: 9 mg/dL (ref 6–23)
CALCIUM: 7.6 mg/dL — AB (ref 8.4–10.5)
CO2: 23 mmol/L (ref 19–32)
CREATININE: 0.97 mg/dL (ref 0.50–1.35)
Chloride: 105 mEq/L (ref 96–112)
GFR calc Af Amer: >90 mL/min (ref >90)
GFR calc non Af Amer: >90 mL/min (ref >90)
Glucose, Bld: 120 mg/dL — ABNORMAL HIGH (ref 70–99)
Potassium: 3.7 mmol/L (ref 3.5–5.1)
SODIUM: 134 mmol/L — AB (ref 135–145)

## 2014-10-14 LAB — CREATININE, SERUM
CREATININE: 1 mg/dL (ref 0.50–1.35)
GFR calc Af Amer: >90 mL/min (ref >90)
GFR calc non Af Amer: >90 mL/min (ref >90)

## 2014-10-14 LAB — MAGNESIUM
MAGNESIUM: 2 mg/dL (ref 1.5–2.5)
Magnesium: 2.1 mg/dL (ref 1.5–2.5)

## 2014-10-14 LAB — TROPONIN I
Troponin I: 7.58 ng/mL (ref <0.031)
Troponin I: 3.21 ng/mL (ref <0.031)

## 2014-10-14 LAB — CK TOTAL AND CKMB (NOT AT ARMC)
CK TOTAL: 176 U/L (ref 7–232)
CK, MB: 9.6 ng/mL (ref 0.3–4.0)
CK, MB: 6.4 ng/mL (ref 0.3–4.0)
RELATIVE INDEX: 4.7 — AB (ref 0.0–2.5)
Relative Index: 3.6 — ABNORMAL HIGH (ref 0.0–2.5)
Total CK: 205 U/L (ref 7–232)

## 2014-10-14 LAB — POCT I-STAT, CHEM 8
BUN: 11 mg/dL (ref 6–23)
CALCIUM ION: 1.13 mmol/L (ref 1.12–1.23)
CHLORIDE: 101 meq/L (ref 96–112)
CREATININE: 0.8 mg/dL (ref 0.50–1.35)
Glucose, Bld: 142 mg/dL — ABNORMAL HIGH (ref 70–99)
HCT: 31 % — ABNORMAL LOW (ref 39.0–52.0)
Hemoglobin: 10.5 g/dL — ABNORMAL LOW (ref 13.0–17.0)
Potassium: 4.8 mmol/L (ref 3.5–5.1)
Sodium: 134 mmol/L — ABNORMAL LOW (ref 135–145)
TCO2: 16 mmol/L (ref 0–100)

## 2014-10-14 MED ORDER — POTASSIUM CHLORIDE 10 MEQ/50ML IV SOLN
10.0000 meq | Freq: Once | INTRAVENOUS | Status: AC
  Administered 2014-10-14: 10 meq via INTRAVENOUS
  Filled 2014-10-14: qty 50

## 2014-10-14 MED ORDER — MORPHINE SULFATE 2 MG/ML IJ SOLN
2.0000 mg | INTRAMUSCULAR | Status: DC | PRN
  Administered 2014-10-14 – 2014-10-16 (×7): 2 mg via INTRAVENOUS
  Filled 2014-10-14 (×8): qty 1

## 2014-10-14 MED ORDER — INSULIN DETEMIR 100 UNIT/ML ~~LOC~~ SOLN
40.0000 [IU] | Freq: Two times a day (BID) | SUBCUTANEOUS | Status: DC
  Administered 2014-10-14 – 2014-10-17 (×8): 40 [IU] via SUBCUTANEOUS
  Filled 2014-10-14 (×10): qty 0.4

## 2014-10-14 MED ORDER — NOREPINEPHRINE BITARTRATE 1 MG/ML IV SOLN
0.0000 ug/min | INTRAVENOUS | Status: DC
  Administered 2014-10-14: 14 ug/min via INTRAVENOUS
  Filled 2014-10-14: qty 16

## 2014-10-14 MED ORDER — FUROSEMIDE 10 MG/ML IJ SOLN
40.0000 mg | Freq: Once | INTRAMUSCULAR | Status: AC
  Administered 2014-10-14: 40 mg via INTRAVENOUS
  Filled 2014-10-14: qty 4

## 2014-10-14 MED ORDER — PHENOL 1.4 % MT LIQD: 1.0000 | OROMUCOSAL | Status: DC | PRN

## 2014-10-14 MED ORDER — INSULIN ASPART 100 UNIT/ML ~~LOC~~ SOLN
0.0000 [IU] | SUBCUTANEOUS | Status: DC
Start: 2014-10-14 — End: 2014-10-16
  Administered 2014-10-14: 4 [IU] via SUBCUTANEOUS
  Administered 2014-10-15 (×3): 2 [IU] via SUBCUTANEOUS
  Administered 2014-10-15: 4 [IU] via SUBCUTANEOUS
  Administered 2014-10-15: 2 [IU] via SUBCUTANEOUS
  Administered 2014-10-15: 4 [IU] via SUBCUTANEOUS
  Administered 2014-10-16 (×2): 2 [IU] via SUBCUTANEOUS

## 2014-10-14 MED ORDER — POTASSIUM CHLORIDE 10 MEQ/50ML IV SOLN
10.0000 meq | INTRAVENOUS | Status: AC
  Administered 2014-10-14 (×3): 10 meq via INTRAVENOUS
  Filled 2014-10-14: qty 50

## 2014-10-14 NOTE — Progress Notes (Signed)
POD # 1 CABG x 4  Extubated earlier today  BP 97/48 mmHg  Pulse 88  Temp(Src) 98.4 F (36.9 C) (Oral)  Resp 36  Ht 5\' 11"  (1.803 m)  Wt 355 lb (161.027 kg)  BMI 49.53 kg/m2  SpO2 96%   Intake/Output Summary (Last 24 hours) at 10/14/14 1922 Last data filed at 10/14/14 1300  Gross per 24 hour  Intake 4566.97 ml  Output   1495 ml  Net 3071.97 ml   Hct 29, PM BMET pending  In SR on amiodarone  Milrinone at 0.2, norepi at 10

## 2014-10-14 NOTE — Progress Notes (Signed)
[address]       [place] [id number]             [phone]        CARDIOTHORACIC SURGERY PROGRESS NOTE   R1 Day Post-Op Procedure(s) (LRB): CORONARY ARTERY BYPASS GRAFTING (CABG), ON PUMP, TIMES FOUR, USING LEFT INTERNAL MAMMARY ARTERY, BILATERAL GREATER SAPHENOUS VEINS HARVESTED ENDOSCOPICALLY (N/A)  Subjective: Lightly sedated on vent.  Follows commands.  Looks comfortable.  Objective: Vital signs: BP Readings from Last 1 Encounters:  10/14/14 87/44   Pulse Readings from Last 1 Encounters:  10/14/14 80   Resp Readings from Last 1 Encounters:  10/14/14 19   Temp Readings from Last 1 Encounters:  10/14/14 99.9 F (37.7 C)     Hemodynamics: PAP: (26-38)/(15-24) 33/17 mmHg CO:  [5.3 L/min-8.1 L/min] 6.2 L/min CI:  [2 L/min/m2-3.1 L/min/m2] 2.4 L/min/m2  Physical Exam:  Rhythm:   sinus  Breath sounds: clear  Heart sounds:  RRR  Incisions:  Dressing dry, intact  Abdomen:  Soft, non-distended, non-tender  Extremities:  Warm, well-perfused  Chest tubes:  Low volume thin serosanguinous output   Intake/Output from previous day: 01/04 0701 - 01/05 0700 In: [id number] [I.V.:7420; Blood:629; NG/GT:60; IV Piggyback:2594] Out: 4045 [Urine:1900; Emesis/NG output:600; Blood:1150; Chest Tube:395] Intake/Output this shift:    Lab Results:  CBC: Recent Labs  10/13/14 1840 10/13/14 2039 10/14/14 0415  WBC 21.8*  --  15.3*  HGB 9.4* 9.5* 9.3*  HCT 27.7* 28.0* 26.6*  PLT 184  --  162    BMET:  Recent Labs  10/13/14 0130  10/13/14 2039 10/14/14 0415  NA 133*  < > 134* 134*  K 4.2  < > 3.9 3.7  CL 97  < > 101 105  CO2 26  --   --  23  GLUCOSE 222*  < > 165* 120*  BUN 12  < > 11 9  CREATININE 0.90  < > 0.80 0.97  CALCIUM 8.4  --   --  7.6*  < > = values in this interval not displayed.   CBG (last 3)   Recent Labs  10/13/14 2146 10/13/14 2237 10/13/14 2355  GLUCAP 144* 159* 137*    ABG    Component Value Date/Time   PHART  7.478* 10/14/2014 0409   PCO2ART 30.0* 10/14/2014 0409   PO2ART 130.0* 10/14/2014 0409   HCO3 22.1 10/14/2014 0409   TCO2 23 10/14/2014 0409   ACIDBASEDEF 1.0 10/14/2014 0409   O2SAT 99.0 10/14/2014 0409    CXR: Unable to view any x-rays this morning and no results available  Assessment/Plan: S/P Procedure(s) (LRB): CORONARY ARTERY BYPASS GRAFTING (CABG), ON PUMP, TIMES FOUR, USING LEFT INTERNAL MAMMARY ARTERY, BILATERAL GREATER SAPHENOUS VEINS HARVESTED ENDOSCOPICALLY (N/A)  Overall stable POD1 Maintaining NSR w/ stable hemodynamics although still on levophed for BP support, milrinone @ 0.3 and study drug Non-sustained WCT (? VT vs SVT) last night during initial several hours post-op, none over last 6 hours on IV amiodarone Expected post op acute blood loss anemia, stable Expected post op volume excess Pre-op class IV CHF Type II diabetes mellitus, un-controlled w/ complications preop Hgba1c 9.8, good glycemic control on insulin drip Remote h/o stroke Morbid obesity HTN Hyperlipidemia   Wean vent and extubate  Decrease milrinone 0.2  Continue IV amiodarone  Wean levophed as tolerated  Hold diuretics until BP improves some  Add levemir insulin and wean drip  Mobilize after extubation   Ahmadou Bolz H 10/14/2014 7:44 AM

## 2014-10-14 NOTE — Procedures (Signed)
Extubation Procedure Note  Patient Details:   Name: Mark Leach DOB: 31-May-1971 MRN: [id number]   Airway Documentation:     Evaluation  O2 sats: stable throughout Complications: No apparent complications Patient did tolerate procedure well. Bilateral Breath Sounds: Clear Suctioning: Oral, Airway Yes   Pt extubated per Rapid Heart Wean Protocol. ABG within normal limits. NIF -30, VC 0.8. Pt extubated to 3L [name]. Pt able to speak name and cough post extubation with no complications. Pt stable throughout. RT will continue to monitor.   Ermalinda BarriosKelley, Chanze Teagle M 10/14/2014, 9:15 AM

## 2014-10-14 NOTE — Progress Notes (Signed)
CRITICAL VALUE ALERT  Critical value received:  Troponin 7.85, CKMB 9.6  Date of notification:  10/14/2014  Time of notification:  0430  Critical value read back:Yes.    Nurse who received alert Tammy SoursMandy Jemiah [place]  MD notified (1st page):  Brunilda PayorJennifer Knapp, coordinator for trial drug  Time of first page:  [phone]  MD notified (2nd page):  Time of second page:  Responding MD:  Brunilda PayorJennifer Knapp  Time MD responded:  [phone]

## 2014-10-14 NOTE — Progress Notes (Signed)
Updated Dr Laneta SimmersBartle on pt, continues to require high doses Levophed, CI>2 and PAD> 15 no evidence of needing volume.  Will hold off on weaning from vent tonight due to pressors per Dr Laneta SimmersBartle.

## 2014-10-15 ENCOUNTER — Inpatient Hospital Stay (HOSPITAL_COMMUNITY): Payer: 59

## 2014-10-15 LAB — CBC
HEMATOCRIT: 26.2 % — AB (ref 39.0–52.0)
Hemoglobin: 8.9 g/dL — ABNORMAL LOW (ref 13.0–17.0)
MCH: 29.6 pg (ref 26.0–34.0)
MCHC: 34 g/dL (ref 30.0–36.0)
MCV: 87 fL (ref 78.0–100.0)
Platelets: 122 10*3/uL — ABNORMAL LOW (ref 150–400)
RBC: 3.01 MIL/uL — ABNORMAL LOW (ref 4.22–5.81)
RDW: 13.7 % (ref 11.5–15.5)
WBC: 11.3 10*3/uL — ABNORMAL HIGH (ref 4.0–10.5)

## 2014-10-15 LAB — GLUCOSE, CAPILLARY
GLUCOSE-CAPILLARY: 138 mg/dL — AB (ref 70–99)
GLUCOSE-CAPILLARY: 174 mg/dL — AB (ref 70–99)
GLUCOSE-CAPILLARY: 177 mg/dL — AB (ref 70–99)
Glucose-Capillary: 157 mg/dL — ABNORMAL HIGH (ref 70–99)
Glucose-Capillary: 159 mg/dL — ABNORMAL HIGH (ref 70–99)
Glucose-Capillary: 149 mg/dL — ABNORMAL HIGH (ref 70–99)

## 2014-10-15 LAB — CK TOTAL AND CKMB (NOT AT ARMC)
CK TOTAL: 190 U/L (ref 7–232)
CK, MB: 6.5 ng/mL — AB (ref 0.3–4.0)
CK, MB: 3.7 ng/mL (ref 0.3–4.0)
Relative Index: 3.4 — ABNORMAL HIGH (ref 0.0–2.5)
Relative Index: 2.9 — ABNORMAL HIGH (ref 0.0–2.5)
Total CK: 126 U/L (ref 7–232)

## 2014-10-15 LAB — BASIC METABOLIC PANEL
Anion gap: 7 (ref 5–15)
BUN: 13 mg/dL (ref 6–23)
CHLORIDE: 103 meq/L (ref 96–112)
CO2: 23 mmol/L (ref 19–32)
Calcium: 8 mg/dL — ABNORMAL LOW (ref 8.4–10.5)
Creatinine, Ser: 0.94 mg/dL (ref 0.50–1.35)
GLUCOSE: 174 mg/dL — AB (ref 70–99)
Potassium: 4.3 mmol/L (ref 3.5–5.1)
SODIUM: 133 mmol/L — AB (ref 135–145)

## 2014-10-15 LAB — TROPONIN I
TROPONIN I: 2.79 ng/mL — AB (ref <0.031)
Troponin I: 3.46 ng/mL (ref <0.031)

## 2014-10-15 LAB — BRAIN NATRIURETIC PEPTIDE: B Natriuretic Peptide: 652.3 pg/mL — ABNORMAL HIGH (ref 0.0–100.0)

## 2014-10-15 MED ORDER — SODIUM CHLORIDE 0.9 % IV SOLN: 250.0000 mL | INTRAVENOUS | Status: DC | PRN

## 2014-10-15 MED ORDER — MOVING RIGHT ALONG BOOK
Freq: Once | Status: AC
  Administered 2014-10-15: 11:00:00
  Filled 2014-10-15: qty 1

## 2014-10-15 MED ORDER — FUROSEMIDE 10 MG/ML IJ SOLN
40.0000 mg | Freq: Four times a day (QID) | INTRAMUSCULAR | Status: AC
  Administered 2014-10-15 – 2014-10-16 (×7): 40 mg via INTRAVENOUS
  Filled 2014-10-15 (×9): qty 4

## 2014-10-15 MED ORDER — ENOXAPARIN SODIUM 40 MG/0.4ML ~~LOC~~ SOLN
40.0000 mg | SUBCUTANEOUS | Status: DC
  Administered 2014-10-16 – 2014-10-21 (×6): 40 mg via SUBCUTANEOUS
  Filled 2014-10-15 (×7): qty 0.4

## 2014-10-15 MED ORDER — SODIUM CHLORIDE 0.9 % IJ SOLN: 3.0000 mL | INTRAMUSCULAR | Status: DC | PRN

## 2014-10-15 MED ORDER — METOPROLOL TARTRATE 25 MG PO TABS
25.0000 mg | ORAL_TABLET | Freq: Two times a day (BID) | ORAL | Status: DC
  Administered 2014-10-15 (×2): 25 mg via ORAL
  Filled 2014-10-15 (×4): qty 1

## 2014-10-15 MED ORDER — SODIUM CHLORIDE 0.9 % IJ SOLN
3.0000 mL | Freq: Two times a day (BID) | INTRAMUSCULAR | Status: DC
  Administered 2014-10-15 – 2014-10-26 (×12): 3 mL via INTRAVENOUS

## 2014-10-15 NOTE — Progress Notes (Signed)
Received prescreen request for inpatient rehab and I have reviewed pt's case. [facility] and based on pt's current diagnosis of deconditioning s/p CABG, it is not likely that [facility]. We recommend that skilled nursing or home with home health be pursued.  Thank you.  Juliann MuleJanine Ercell Razon, PT Rehabilitation Admissions Coordinator [phone]

## 2014-10-15 NOTE — Progress Notes (Addendum)
[address]       [place] [address]             [phone]        CARDIOTHORACIC SURGERY PROGRESS NOTE   R2 Days Post-Op Procedure(s) (LRB): CORONARY ARTERY BYPASS GRAFTING (CABG), ON PUMP, TIMES FOUR, USING LEFT INTERNAL MAMMARY ARTERY, BILATERAL GREATER SAPHENOUS VEINS HARVESTED ENDOSCOPICALLY (N/A)  Subjective: Doing well.  Only complaint is some soreness in throat.  No chest pain.  Breathing comfortably.  + productive cough  Objective: Vital signs: BP Readings from Last 1 Encounters:  10/15/14 123/75   Pulse Readings from Last 1 Encounters:  10/15/14 110   Resp Readings from Last 1 Encounters:  10/15/14 31   Temp Readings from Last 1 Encounters:  10/15/14 98.1 F (36.7 C) Oral    Hemodynamics: PAP: (31-38)/(15-18) 38/15 mmHg CO:  [6.4 L/min] 6.4 L/min CI:  [2.4 L/min/m2] 2.4 L/min/m2  Physical Exam:  Rhythm:   sinus  Breath sounds: Few rhonchi  Heart sounds:  RRR  Incisions:  Dressing dry, intact  Abdomen:  Soft, non-distended, non-tender  Extremities:  Warm, well-perfused  Chest tubes:  Minimal output   Intake/Output from previous day: 01/05 0701 - 01/06 0700 In: 1000.4 [I.V.:900.4; IV Piggyback:100] Out: 2210 [Urine:1930; Chest Tube:280] Intake/Output this shift: Total I/O In: 33.4 [I.V.:33.4] Out: 110 [Urine:100; Chest Tube:10]  Lab Results:  CBC: Recent Labs  10/14/14 1345 10/14/14 1639 10/15/14 0407  WBC 18.3*  --  11.3*  HGB 9.7* 10.5* 8.9*  HCT 28.6* 31.0* 26.2*  PLT 169  --  122*    BMET:  Recent Labs  10/14/14 0415  10/14/14 1639 10/15/14 0407  NA 134*  --  134* 133*  K 3.7  --  4.8 4.3  CL 105  --  101 103  CO2 23  --   --  23  GLUCOSE 120*  --  142* 174*  BUN 9  --  11 13  CREATININE 0.97  < > 0.80 0.94  CALCIUM 7.6*  --   --  8.0*  < > = values in this interval not displayed.   CBG (last 3)   Recent Labs  10/14/14 1936 10/14/14 2337 10/15/14 0400  GLUCAP 164* 150* 157*    ABG      Component Value Date/Time   PHART 7.402 10/14/2014 1018   PCO2ART 31.8* 10/14/2014 1018   PO2ART 71.0* 10/14/2014 1018   HCO3 19.8* 10/14/2014 1018   TCO2 16 10/14/2014 1639   ACIDBASEDEF 4.0* 10/14/2014 1018   O2SAT 94.0 10/14/2014 1018    CXR: PORTABLE CHEST - 1 VIEW  COMPARISON: 10/14/2014.  FINDINGS: Interval removal Swan-Ganz catheter. Interval removal of NG tube. Right IJ line, mediastinal drainage catheter, left chest tube in stable position. Interval partial clearing of right base subsegmental atelectasis. Persistent left mid lung field atelectasis. Persistent left lower lobe atelectatic changes. Mild infiltrate cannot be excluded. Small left pleural effusion cannot be excluded. No pneumothorax.  IMPRESSION: 1. And removal Swan-Ganz catheter and NG tube. Right IJ line, mediastinal drainage catheter, left chest tube in stable position. 2. Interval partial clearing of right base subsegmental atelectasis. Persistent left mid lung field subsegmental atelectasis. Persistent left lower lobe atelectatic changes and or mild infiltrate. Small left pleural effusion cannot be excluded.   Electronically Signed  By: Maisie Fus Register  On: 10/15/2014 07:57   Assessment/Plan: S/P Procedure(s) (LRB): CORONARY ARTERY BYPASS GRAFTING (CABG), ON PUMP, TIMES FOUR, USING LEFT INTERNAL MAMMARY ARTERY, BILATERAL  GREATER SAPHENOUS VEINS HARVESTED ENDOSCOPICALLY (N/A)  Overall doing well POD2 Maintaining NSR w/ stable hemodynamics off all drips Expected post op atelectasis, mild, stable Expected post op acute blood loss anemia, Hgb down slightly Expected post op volume excess Severe ischemic cardiomyopathy, pre-op class IV CHF w/ EF 25% Type II diabetes mellitus, un-controlled w/ complications (Hgb a1c 9.8 preop), good glycemic control off insulin drip Remote h/o stroke Morbid obesity HTN Hyperlipidemia Degenerative arthritis w/ chronic knee pain   D/C Aline  D/C  chest tubes  Mobilize  Diuresis - keep foley today to monitor UOP  Pulm toilet  PT consult to assist w/ mobility  Increase metoprolol  Continue levemir and SSI  Lovenox for DVT prophylaxis   Keep in SICU today  Ariyah Sedlack H 10/15/2014 8:23 AM

## 2014-10-15 NOTE — Evaluation (Signed)
Occupational Therapy Evaluation Patient Details Name: Mark Leach MRN: [id number] DOB: January 12, 1971 Today's Date: 10/15/2014    History of Present Illness This 44 y.o. male admitted wtih chest pain.  Dx Anterior STEMI.   Pt underwent CABG x 10/13/13.  PMH includes:  CAD; ischemic dilated cardiomyopathy; HTN; DM; Morbid obesity; CHF; Visual field deficit; h/o MI   Clinical Impression   Pt admitted with above. He demonstrates the below listed deficits and will benefit from continued OT to maximize safety and independence with BADLs.  Pt presents to OT with deconditioning/generalized weakness and acute pain coupled with morbid obesity.  He will likely require extensive rehab prior to return home - he reports wife works during the day and daughter is in school so limited support upon discharge.  Recommend SNF.       Follow Up Recommendations  SNF;Supervision/Assistance - 24 hour    Equipment Recommendations  3 in 1 bedside comode (bari commode)    Recommendations for Other Services       Precautions / Restrictions Precautions Precautions: Sternal;Fall Restrictions Weight Bearing Restrictions: Yes Other Position/Activity Restrictions: NWB UEs sternal precautions      Mobility Bed Mobility Overal bed mobility: Needs Assistance;+2 for physical assistance Bed Mobility: Rolling Rolling: Min assist         General bed mobility comments: Pt was instructed in technique to roll.  Rolls to [employer]. with min guard assist, and to Lt. with min A.  Pt also able to scoot hips and shoulders Left and Rt in bed with min guard and verbal cues for technique.  RR 40 with activity and pt fatigued.   Transfers                      Balance                                            ADL Overall ADL's : Needs assistance/impaired Eating/Feeding: NPO   Grooming: Wash/dry hands;Wash/dry face;Oral care;Set up;Bed level   Upper Body Bathing: Moderate assistance;Bed level    Lower Body Bathing: Total assistance;Bed level   Upper Body Dressing : Total assistance;Bed level   Lower Body Dressing: Total assistance;Bed level   Toilet Transfer: Total assistance Toilet Transfer Details (indicate cue type and reason): unable to safely perform with +1 Toileting- Clothing Manipulation and Hygiene: Total assistance;Bed level         General ADL Comments: Pt fatigued this pm, and fatigues quickly with basic activity.   RN reported it required +4 to move pt recliner > bed. Reviewed sternal precautions with pt - he required mod verbal cues to recall them.  Focused on proper technique with bed mobility.       Vision                     Perception     Praxis      Pertinent Vitals/Pain Pain Assessment: 0-10 Pain Score: 4  Pain Location: chest Pain Descriptors / Indicators: Aching;Grimacing Pain Intervention(s): Monitored during session;Limited activity within patient's tolerance     Hand Dominance Right   Extremity/Trunk Assessment Upper Extremity Assessment Upper Extremity Assessment: Generalized weakness   Lower Extremity Assessment Lower Extremity Assessment: Defer to PT evaluation       Communication Communication Communication: No difficulties   Cognition Arousal/Alertness: Awake/alert Behavior During Therapy: WFL for tasks assessed/performed Overall  Cognitive Status: No family/caregiver present to determine baseline cognitive functioning (pt providing conflictin info to PT and OT re: home )                     General Comments       Exercises       Shoulder Instructions      Home Living Family/patient expects to be discharged to:: Private residence Living Arrangements: Spouse/significant other Available Help at Discharge: Family;Available 24 hours/day Type of Home: House Home Access: Stairs to enter Enter[employer] CorporationEntrance Stairs-Number of Steps: 5 Entrance Stairs-Rails: Right;Left Home Layout: One level     Bathroom  Shower/Tub: Producer, television/film/videoWalk-in shower   Bathroom Toilet: Standard     Home Equipment: Cane - single point;Tub bench   Additional Comments: Pt reports the he lives with wife and 818 y.o daughter.  He reports his wife works during the day, and that his daughter is in school, and that he needs to be independent to discharge home       Prior Functioning/Environment Level of Independence: Independent with assistive device(s)        Comments: cane for ambulation.  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  Takes mostly online courses     OT Diagnosis: Generalized weakness;Acute pain   OT Problem List: Decreased strength;Decreased activity tolerance;Decreased safety awareness;Decreased knowledge of use of DME or AE;Decreased knowledge of precautions;Cardiopulmonary status limiting activity;Pain;Obesity   OT Treatment/Interventions: Self-care/ADL training;Energy conservation;DME and/or AE instruction;Therapeutic activities;Patient/family education;Balance training    OT Goals(Current goals can be found in the care plan section) Acute Rehab OT Goals Patient Stated Goal: Get better OT Goal Formulation: With patient Time For Goal Achievement: 10/29/14 Potential to Achieve Goals: Good ADL Goals Pt Will Perform Grooming: with min assist;standing Pt Will Perform Upper Body Bathing: with supervision;sitting Pt Will Perform Lower Body Bathing: with mod assist;with adaptive equipment;sit to/from stand Pt Will Perform Upper Body Dressing: with set-up;with supervision;sitting Pt Will Perform Lower Body Dressing: with mod assist;with adaptive equipment;sit to/from stand Pt Will Transfer to Toilet: with min assist;ambulating;regular height toilet;bedside commode Pt Will Perform Toileting - Clothing Manipulation and hygiene: with min assist;sit to/from stand  OT Frequency: Min 2X/week   Barriers to D/C: Decreased caregiver support           Co-evaluation              End of Session Equipment Utilized During Treatment: Oxygen Nurse Communication: Mobility status  Activity Tolerance: Patient limited by fatigue Patient left: in bed;with call bell/phone within reach   Time: [id number] OT Time Calculation (min): 21 min Charges:  OT General Charges $OT Visit: 1 Procedure OT Evaluation $Initial OT Evaluation Tier I: 1 Procedure OT Treatments $Therapeutic Activity: 8-22 mins G-Codes:    Malaiya Paczkowski M 10/15/2014, 5:08 PM

## 2014-10-15 NOTE — Evaluation (Signed)
Physical Therapy Evaluation Patient Details Name: Mark DukesBruce Leugers MRN: [id number] DOB: May 09, 1971 Today's Date: 10/15/2014   History of Present Illness  44 y.o. male s/p CORONARY ARTERY BYPASS GRAFTING (CABG), ON PUMP, TIMES FOUR, USING LEFT INTERNAL MAMMARY ARTERY, BILATERAL GREATER SAPHENOUS VEINS HARVESTED ENDOSCOPICALLY  Clinical Impression  Patient is s/p above surgery resulting in functional limitations due to the deficits listed below (see PT Problem List). Requires min assist +2 for most mobility assessed today, including bed mobility and transfer from elevated bariatric chair. Able to ambulate short distance without any instances of knee buckling however this was a very taxing task for pt and required extra time to complete. Patient will benefit from skilled PT to increase their independence and safety with mobility to allow discharge to the venue listed below.       Follow Up Recommendations CIR    Equipment Recommendations   (TBD)    Recommendations for Other Services Rehab consult     Precautions / Restrictions Precautions Precautions: Sternal;Fall Restrictions Weight Bearing Restrictions: Yes Other Position/Activity Restrictions: NWB UEs sternal precautions      Mobility  Bed Mobility Overal bed mobility: Needs Assistance;+2 for physical assistance Bed Mobility: Rolling;Sit to Sidelying Rolling: Min guard;+2 for safety/equipment       Sit to sidelying: Min assist;+2 for physical assistance General bed mobility comments: Min assist +2 for truncal and LE support to enter bed. Educated on log roll technique to maintain sternal precautions.  Transfers Overall transfer level: Needs assistance Equipment used: Rolling walker (2 wheeled) Transfers: Sit to/from Stand Sit to Stand: Min assist;+2 physical assistance;From elevated surface         General transfer comment: Min assist +2 for boost from Bariatric chair. VC to maintain sternal precautions and educated on safe  technique. Good control with descent into bed.  Ambulation/Gait Ambulation/Gait assistance: Min assist;+2 safety/equipment Ambulation Distance (Feet): 4 Feet Assistive device: Rolling walker (2 wheeled) Gait Pattern/deviations: Step-through pattern;Decreased step length - right;Decreased step length - left;Decreased stride length;Shuffle;Trunk flexed Gait velocity: decreased Gait velocity interpretation: Below normal speed for age/gender General Gait Details: Able to ambulate short distance to bed from chair including backwards and side stepping. No buckling noted. Min assist for walker placement within base of support. Cues to maintain upright posture and prevent weight bearing through UEs, only using RW for balance. Required extra time due to pt reporting pain, requiring several rest breaks for this short distance.  Stairs            Wheelchair Mobility    Modified Rankin (Stroke Patients Only)       Balance Overall balance assessment: Needs assistance Sitting-balance support: No upper extremity supported;Feet supported Sitting balance-Leahy Scale: Fair     Standing balance support: No upper extremity supported Standing balance-Leahy Scale: Fair                               Pertinent Vitals/Pain Pain Assessment: 0-10 Pain Score: 4  Pain Location: throat Pain Descriptors / Indicators: Sore Pain Intervention(s): Monitored during session;Repositioned  SpO2 98% on 3L supplemental O2 HR 109 BP 128/61    Home Living Family/patient expects to be discharged to:: Private residence Living Arrangements: Spouse/significant other Available Help at Discharge: Family;Available 24 hours/day (Pts mother and wife available) Type of Home: House Home Access: Stairs to enter Entrance Stairs-Rails: Doctor, general practiceight;Left Entrance Stairs-Number of Steps: 5 Home Layout: One level Home Equipment: Cane - single point;Tub bench  Prior Function Level of Independence: Independent  with assistive device(s)         Comments: cane for ambulation     Hand Dominance   Dominant Hand: Right    Extremity/Trunk Assessment   Upper Extremity Assessment: Defer to OT evaluation           Lower Extremity Assessment: Generalized weakness         Communication   Communication: No difficulties  Cognition Arousal/Alertness: Awake/alert Behavior During Therapy: WFL for tasks assessed/performed Overall Cognitive Status: Within Functional Limits for tasks assessed                      General Comments      Exercises        Assessment/Plan    PT Assessment Patient needs continued PT services  PT Diagnosis Difficulty walking;Generalized weakness;Acute pain   PT Problem List Decreased strength;Decreased range of motion;Decreased activity tolerance;Decreased balance;Decreased mobility;Decreased knowledge of use of DME;Decreased knowledge of precautions;Cardiopulmonary status limiting activity;Obesity;Pain  PT Treatment Interventions DME instruction;Gait training;Functional mobility training;Therapeutic activities;Therapeutic exercise;Balance training;Neuromuscular re-education;Patient/family education;Modalities   PT Goals (Current goals can be found in the Care Plan section) Acute Rehab PT Goals Patient Stated Goal: Get better PT Goal Formulation: With patient Time For Goal Achievement: 10/29/14 Potential to Achieve Goals: Good    Frequency Min 3X/week   Barriers to discharge Decreased caregiver support Wife works during the day. Pts elderly mother coming to town temporarily.    Co-evaluation               End of Session Equipment Utilized During Treatment: Oxygen Activity Tolerance: Patient limited by fatigue Patient left: in bed;with call bell/phone within reach;with nursing/sitter in room Nurse Communication: Mobility status         Time: 9811-9147 PT Time Calculation (min) (ACUTE ONLY): 21 min   Charges:   PT  Evaluation $Initial PT Evaluation Tier I: 1 Procedure PT Treatments $Therapeutic Activity: 8-22 mins   PT G Codes:        Berton Mount 10/15/2014, 11:15 AM Charlsie Merles, PT [phone]

## 2014-10-16 ENCOUNTER — Inpatient Hospital Stay (HOSPITAL_COMMUNITY): Payer: 59

## 2014-10-16 ENCOUNTER — Encounter (HOSPITAL_COMMUNITY): Payer: Self-pay | Admitting: Dentistry

## 2014-10-16 DIAGNOSIS — I5043 Acute on chronic combined systolic (congestive) and diastolic (congestive) heart failure: Secondary | ICD-10-CM

## 2014-10-16 LAB — BASIC METABOLIC PANEL
ANION GAP: 8 (ref 5–15)
BUN: 17 mg/dL (ref 6–23)
CHLORIDE: 99 meq/L (ref 96–112)
CO2: 28 mmol/L (ref 19–32)
Calcium: 8.2 mg/dL — ABNORMAL LOW (ref 8.4–10.5)
Creatinine, Ser: 1.05 mg/dL (ref 0.50–1.35)
GFR calc Af Amer: >90 mL/min (ref >90)
GFR calc non Af Amer: 86 mL/min — ABNORMAL LOW (ref >90)
Glucose, Bld: 149 mg/dL — ABNORMAL HIGH (ref 70–99)
Potassium: 3.8 mmol/L (ref 3.5–5.1)
Sodium: 135 mmol/L (ref 135–145)

## 2014-10-16 LAB — GLUCOSE, CAPILLARY
GLUCOSE-CAPILLARY: 144 mg/dL — AB (ref 70–99)
GLUCOSE-CAPILLARY: 237 mg/dL — AB (ref 70–99)
GLUCOSE-CAPILLARY: 141 mg/dL — AB (ref 70–99)
GLUCOSE-CAPILLARY: 165 mg/dL — AB (ref 70–99)
Glucose-Capillary: 127 mg/dL — ABNORMAL HIGH (ref 70–99)

## 2014-10-16 LAB — CBC
HCT: 27.9 % — ABNORMAL LOW (ref 39.0–52.0)
Hemoglobin: 9.1 g/dL — ABNORMAL LOW (ref 13.0–17.0)
MCH: 28.1 pg (ref 26.0–34.0)
MCHC: 32.6 g/dL (ref 30.0–36.0)
MCV: 86.1 fL (ref 78.0–100.0)
PLATELETS: 189 10*3/uL (ref 150–400)
RBC: 3.24 MIL/uL — ABNORMAL LOW (ref 4.22–5.81)
RDW: 13.8 % (ref 11.5–15.5)
WBC: 13 10*3/uL — AB (ref 4.0–10.5)

## 2014-10-16 MED ORDER — POTASSIUM CHLORIDE 10 MEQ/50ML IV SOLN
10.0000 meq | INTRAVENOUS | Status: AC
  Administered 2014-10-16 (×4): 10 meq via INTRAVENOUS
  Filled 2014-10-16 (×3): qty 50

## 2014-10-16 MED ORDER — METOPROLOL TARTRATE 50 MG PO TABS
50.0000 mg | ORAL_TABLET | Freq: Two times a day (BID) | ORAL | Status: DC
Start: 2014-10-16 — End: 2014-10-23
  Administered 2014-10-16 – 2014-10-22 (×12): 50 mg via ORAL
  Filled 2014-10-16 (×16): qty 1

## 2014-10-16 MED ORDER — AMIODARONE HCL 200 MG PO TABS
400.0000 mg | ORAL_TABLET | Freq: Three times a day (TID) | ORAL | Status: DC
  Administered 2014-10-16 – 2014-10-20 (×12): 400 mg via ORAL
  Filled 2014-10-16 (×15): qty 2

## 2014-10-16 MED ORDER — INSULIN ASPART 100 UNIT/ML ~~LOC~~ SOLN
0.0000 [IU] | Freq: Every day | SUBCUTANEOUS | Status: DC
  Administered 2014-10-20: 2 [IU] via SUBCUTANEOUS

## 2014-10-16 MED ORDER — ATORVASTATIN CALCIUM 80 MG PO TABS
80.0000 mg | ORAL_TABLET | Freq: Every day | ORAL | Status: DC
  Administered 2014-10-17 – 2014-10-25 (×9): 80 mg via ORAL
  Filled 2014-10-16 (×10): qty 1

## 2014-10-16 MED ORDER — LISINOPRIL 10 MG PO TABS
10.0000 mg | ORAL_TABLET | Freq: Every day | ORAL | Status: DC
  Administered 2014-10-16 – 2014-10-20 (×5): 10 mg via ORAL
  Filled 2014-10-16 (×6): qty 1

## 2014-10-16 MED ORDER — FUROSEMIDE 80 MG PO TABS
80.0000 mg | ORAL_TABLET | Freq: Two times a day (BID) | ORAL | Status: DC
  Administered 2014-10-17 – 2014-10-20 (×8): 80 mg via ORAL
  Filled 2014-10-16 (×11): qty 1

## 2014-10-16 MED ORDER — SIMVASTATIN 40 MG PO TABS: 40.0000 mg | ORAL_TABLET | Freq: Every day | ORAL | Status: DC

## 2014-10-16 MED ORDER — INSULIN ASPART 100 UNIT/ML ~~LOC~~ SOLN
0.0000 [IU] | Freq: Three times a day (TID) | SUBCUTANEOUS | Status: DC
  Administered 2014-10-16 (×2): 3 [IU] via SUBCUTANEOUS
  Administered 2014-10-16: 7 [IU] via SUBCUTANEOUS
  Administered 2014-10-17: 3 [IU] via SUBCUTANEOUS
  Administered 2014-10-17 – 2014-10-18 (×2): 4 [IU] via SUBCUTANEOUS
  Administered 2014-10-18 – 2014-10-19 (×2): 3 [IU] via SUBCUTANEOUS
  Administered 2014-10-19: 4 [IU] via SUBCUTANEOUS
  Administered 2014-10-20: 3 [IU] via SUBCUTANEOUS
  Administered 2014-10-20: 7 [IU] via SUBCUTANEOUS
  Administered 2014-10-21: 4 [IU] via SUBCUTANEOUS
  Administered 2014-10-21: 3 [IU] via SUBCUTANEOUS
  Administered 2014-10-21: 4 [IU] via SUBCUTANEOUS
  Administered 2014-10-23: 7 [IU] via SUBCUTANEOUS
  Administered 2014-10-23: 4 [IU] via SUBCUTANEOUS
  Administered 2014-10-24 – 2014-10-25 (×3): 3 [IU] via SUBCUTANEOUS
  Administered 2014-10-25 – 2014-10-26 (×4): 4 [IU] via SUBCUTANEOUS

## 2014-10-16 MED ORDER — ASPIRIN EC 325 MG PO TBEC
325.0000 mg | DELAYED_RELEASE_TABLET | Freq: Every day | ORAL | Status: DC
  Administered 2014-10-16 – 2014-10-26 (×10): 325 mg via ORAL
  Filled 2014-10-16 (×11): qty 1

## 2014-10-16 MED ORDER — DM-GUAIFENESIN ER 30-600 MG PO TB12
2.0000 | ORAL_TABLET | Freq: Two times a day (BID) | ORAL | Status: DC
  Administered 2014-10-16 – 2014-10-26 (×20): 2 via ORAL
  Filled 2014-10-16 (×22): qty 2

## 2014-10-16 MED FILL — Potassium Chloride Inj 2 mEq/ML: INTRAVENOUS | Qty: 40 | Status: AC

## 2014-10-16 MED FILL — Magnesium Sulfate Inj 50%: INTRAMUSCULAR | Qty: 10 | Status: AC

## 2014-10-16 MED FILL — Heparin Sodium (Porcine) Inj 1000 Unit/ML: INTRAMUSCULAR | Qty: 30 | Status: AC

## 2014-10-16 NOTE — Progress Notes (Signed)
[address]       [place] [address]             [phone]        CARDIOTHORACIC SURGERY PROGRESS NOTE   R3 Days Post-Op Procedure(s) (LRB): CORONARY ARTERY BYPASS GRAFTING (CABG), ON PUMP, TIMES FOUR, USING LEFT INTERNAL MAMMARY ARTERY, BILATERAL GREATER SAPHENOUS VEINS HARVESTED ENDOSCOPICALLY (N/A)  Subjective: Feels okay.  Mild soreness in chest.  Denies SOB  Objective: Vital signs: BP Readings from Last 1 Encounters:  10/16/14 135/74   Pulse Readings from Last 1 Encounters:  10/16/14 121   Resp Readings from Last 1 Encounters:  10/16/14 31   Temp Readings from Last 1 Encounters:  10/16/14 98.1 F (36.7 C) Oral    Hemodynamics:    Physical Exam:  Rhythm:   Sinus tach  Breath sounds: Fairly clear  Heart sounds:  RRR  Incisions:  Dressings dry, intact  Abdomen:  Soft, non-distended, non-tender  Extremities:  Warm, well-perfused    Intake/Output from previous day: 01/06 0701 - 01/07 0700 In: 1050.2 [P.O.:660; I.V.:340.2; IV Piggyback:50] Out: 5635 [Urine:5575; Chest Tube:60] Intake/Output this shift:    Lab Results:  CBC: Recent Labs  10/15/14 0407 10/16/14 0425  WBC 11.3* 13.0*  HGB 8.9* 9.1*  HCT 26.2* 27.9*  PLT 122* 189    BMET:  Recent Labs  10/15/14 0407 10/16/14 0425  NA 133* 135  K 4.3 3.8  CL 103 99  CO2 23 28  GLUCOSE 174* 149*  BUN 13 17  CREATININE 0.94 1.05  CALCIUM 8.0* 8.2*     CBG (last 3)   Recent Labs  10/15/14 1926 10/15/14 2339 10/16/14 0338  GLUCAP 174* 138* 144*    ABG    Component Value Date/Time   PHART 7.402 10/14/2014 1018   PCO2ART 31.8* 10/14/2014 1018   PO2ART 71.0* 10/14/2014 1018   HCO3 19.8* 10/14/2014 1018   TCO2 16 10/14/2014 1639   ACIDBASEDEF 4.0* 10/14/2014 1018   O2SAT 94.0 10/14/2014 1018    CXR: PORTABLE CHEST - 1 VIEW  COMPARISON: 10/15/2014.  FINDINGS: Interval removal of left chest tube and mediastinal drainage catheter. Right IJ line  stable position. Stable cardiomegaly. Normal pulmonary vascularity . Prior CABG. Mild left mid and left lower lung subsegmental atelectasis. Tiny left effusion cannot be excluded. No pneumothorax.  IMPRESSION: 1. Interim removal of left chest tube and mediastinal drainage catheter. Right IJ line stable position . 2. Stable cardiomegaly. Prior CABG. No CHF. 3. Stable left mid and lower lung subsegmental atelectasis. Tiny left pleural effusion cannot be excluded. No pneumothorax.   Electronically Signed  By: Maisie Fus Register  On: 10/16/2014 07:43  Assessment/Plan: S/P Procedure(s) (LRB): CORONARY ARTERY BYPASS GRAFTING (CABG), ON PUMP, TIMES FOUR, USING LEFT INTERNAL MAMMARY ARTERY, BILATERAL GREATER SAPHENOUS VEINS HARVESTED ENDOSCOPICALLY (N/A)  Overall stable POD3 Maintaining NSR/sinus tach w/ stable BP that has been trending up Expected post op atelectasis, mild, stable Expected post op acute blood loss anemia, stable Expected post op volume excess Severe ischemic cardiomyopathy, pre-op class IV CHF w/ EF 25% Type II diabetes mellitus, un-controlled w/ complications (Hgb a1c 9.8 preop), good glycemic control off insulin drip on levemir + SSI Remote h/o stroke Morbid obesity HTN Hyperlipidemia Degenerative arthritis w/ chronic knee pain Extremely poor dentition   Mobilize  Continue PT/OT  Diuresis  Increase metoprolol and restart ACE-I  Change CBG's and SSI to ac/hs  Add statin - will need something available in generic form  Restart  metformin at higher dose tomorrow if eating well  Orthopantogram and dental consult tomorrow - probably should have dental extraction during this hospitalization prior to discharge  Transfer step down   Brae Gartman H 10/16/2014 8:02 AM

## 2014-10-16 NOTE — Progress Notes (Signed)
    Subjective:  Denies dyspnea; chest sore   Objective:  Filed Vitals:   10/16/14 0900 10/16/14 1000 10/16/14 1100 10/16/14 1143  BP: 115/89 133/76 116/63   Pulse: 123 109 122   Temp:    98.3 F (36.8 C)  TempSrc:    Oral  Resp: 32 44 36   Height:      Weight:      SpO2: 95% 97% 95%     Intake/Output from previous day:  Intake/Output Summary (Last 24 hours) at 10/16/14 1236 Last data filed at 10/16/14 1100  Gross per 24 hour  Intake    950 ml  Output   5375 ml  Net  -4425 ml    Physical Exam: Physical exam: Well-developed obese in no acute distress.  Skin is warm and dry.  HEENT is normal.  Neck is supple.  Chest is clear to auscultation with normal expansion. S/p sternotomy Cardiovascular exam is regular rate and rhythm.  Abdominal exam nontender or distended. No masses palpated. Extremities show 1+ edema. neuro grossly intact    Lab Results: Basic Metabolic Panel:  Recent Labs  16/07/9600/05/16 0415 10/14/14 1345  10/15/14 0407 10/16/14 0425  NA 134*  --   < > 133* 135  K 3.7  --   < > 4.3 3.8  CL 105  --   < > 103 99  CO2 23  --   --  23 28  GLUCOSE 120*  --   < > 174* 149*  BUN 9  --   < > 13 17  CREATININE 0.97 1.00  < > 0.94 1.05  CALCIUM 7.6*  --   --  8.0* 8.2*  MG 2.1 2.0  --   --   --   < > = values in this interval not displayed. CBC:  Recent Labs  10/15/14 0407 10/16/14 0425  WBC 11.3* 13.0*  HGB 8.9* 9.1*  HCT 26.2* 27.9*  MCV 87.0 86.1  PLT 122* 189   Cardiac Enzymes:  Recent Labs  10/14/14 1345 10/15/14 0117 10/15/14 1345  CKTOTAL 176 190 126  CKMB 6.4* 6.5* 3.7  TROPONINI 3.21* 3.46* 2.79*     Assessment/Plan:  1 s/p CABG 2 postoperative volume excess/acute systolic CHF-continue diuresis and follow renal function 3 s/p MI-continue ASA, metoprolol and statin 4 ICM-EF 25-30-continue metoprolol and lisinopril; titrate meds as tolerated by BP; at DC transition to toprol. Reassess LV function in 3 months; if EF < 35,  would need referral for ICD. 5 Hyperlipidemia-continue lipitor 80 mg daily; lipids and liver in six weeks 6 Poorly controlled DM-continue present meds; will need close fu with primary care following DC for further management. Would arrange [facility] (Dr Wyline MoodBranch, Dr Purvis SheffieldKoneswaran or Dr Diona BrownerMcDowell) [facility] 10/16/2014, 12:36 PM

## 2014-10-16 NOTE — Progress Notes (Signed)
CT surgery p.m. Rounds  Out of bed in chair Sinus rhythm Patient had a stable day, last blood sugar 141

## 2014-10-16 NOTE — Progress Notes (Signed)
CARE MANAGEMENT NOTE 10/16/2014  Patient:  Mark Leach,Mark Leach   Account Number:  [id number]  Date Initiated:  10/14/2014  Documentation initiated by:  Annette Bertelson  Subjective/Objective Assessment:   s/p CABG x4; lives with spouse      DC Planning Services  CM consult      Status of service:  In process, will continue to follow  Per UR Regulation:  Reviewed for med. necessity/level of care/duration of stay  Comments:  10/16/13 1411 Mark Michna RN MSN BSN CCM Pt OOB to chair - was previously listed as self pay, now shows [facility], which pt verifies as his coverage.  Discussed low-cost dental clinics and provided pt with contact information.  10/14/13 1226 Adreona Brand RN MSN BSN CCM Received referral related to poor dentition.  [place] [facility] message left requesting return call to obtain information to relay to pt. [phone] Received return call and was informed that [facility] charges full price and does not have any free or low-cost services.  Printed information about [facility], [facility], and [facility], all of which use a sliding scale to determine pa

## 2014-10-16 NOTE — Consult Note (Signed)
DENTAL CONSULTATION  Date of Consultation:  10/16/2014 Patient Name:   Mark Leach Date of Birth:   1971/10/02 Medical Record Number: [id number]  VITALS: BP 135/74 mmHg  Pulse 121  Temp(Src) 98.6 F (37 C) (Oral)  Resp 31  Ht 5\' 11"  (1.803 m)  Wt 349 lb 11.2 oz (158.623 kg)  BMI 48.79 kg/m2  SpO2 95%  CHIEF COMPLAINT: The patient was referred by Dr. Cornelius Moras for a dental consultation.  HPI: Mark Leach is a 44 year old male recently diagnosed with acute myocardial infarction and coronary artery disease. Patient underwent an emergent four-vessel coronary artery bypass graft procedure with Dr. Cornelius Moras on Monday, 10/13/2014. Patient was noted to have chronic, advanced periodontal disease and poor dentition. Patient was then referred for a dental evaluation of his poor dentition and to provide treatment as indicated during this admission. Patient is now seen to rule out dental infection that may affect the patient's systemic health.  Patient currently denies acute toothaches, swellings, or abscesses. Patient has not seen a dentist for over 20 years due to economic concerns.  Patient denies having any partial dentures. Patient knows that he has many bad teeth and wants to" get all my teeth pulled".   PROBLEM LIST: Patient Active Problem List   Diagnosis Date Noted  . 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 Acute congestive heart failure 10/12/2014  . Visual field defect 10/12/2014  . Stroke 10/12/2014  . Acute on chronic combined systolic and diastolic heart failure   . ST elevation myocardial infarction (STEMI) involving left anterior descending (LAD) coronary artery with complication 10/10/2014  . Ischemic dilated cardiomyopathy   . Hypertension   . Morbid obesity   . Diabetes mellitus type 2, uncontrolled, with complications   . Coronary artery disease 01/08/2013    PMH: Past Medical History  Diagnosis Date  . Coronary artery disease 01/2013    anterior MI with cath showing 95% pLAD, 90% diag,  30% OM1, 70% mRCA, 60% dRCA s/p PCI with DES to LAD and diag and EF 40%  . Ischemic dilated cardiomyopathy   . Hypertension   . Morbid obesity   . Acute on chronic combined systolic and diastolic heart failure   . Visual field defect 10/12/2014  . Diabetes mellitus type 2, uncontrolled, with complications   . Stroke 10/12/2014    Patient with several month h/o left sided visual field deficit and MRI of brain revealing:  1. Medial right occipital lobe subacute infarct. 2. At least 3 punctate areas of acute nonhemorrhagic infarct are present involving the left thalamus, high posterior right frontal lobe and high a medial posterior left frontal or parietal lobe. 3. Additional white matter changes are noted beyond the areas of  . S/P CABG x 4 10/13/2014    LIMA to LAD, SVG to D1, SVG to OM, SVG to PDA, EVH via bilateral thighs    PSH: Past Surgical History  Procedure Laterality Date  . Cardiac catheterization  01/2013    [facility] [place]  . Coronary angioplasty  01/2013    PCI with stenting of LAD  . Cholecystectomy    . Left heart cath N/A 10/10/2014    Procedure: LEFT HEART CATH;  Surgeon: Lennette Bihari, MD;  Location[facility] C[facility] LAB;  Service: Cardiovascular;  Laterality: N/A;  . Coronary artery bypass graft N/A 10/13/2014    Procedure: CORONARY ARTERY BYPASS GRAFTING (CABG), ON PUMP, TIMES FOUR, USING LEFT INTERNAL MAMMARY ARTERY, BILATERAL GREATER SAPHENOUS VEINS HARVESTED ENDOSCOPICALLY;  Surgeon: Purcell Nails,  MD;  Location: [facility] OR;  Service: Open Heart Surgery;  Laterality: N/A;    ALLERGIES: Allergies  Allergen Reactions  . Nitroglycerin Other (See Comments)    Hypotension, syncope, bradycardia    MEDICATIONS: Current Facility-Administered Medications  Medication Dose Route Frequency Provider Last Rate Last Dose  . 0.9 %  sodium chloride infusion  250 mL Intravenous Continuous Purcell Nails, MD   250 mL at 10/14/14 0455  . 0.9 %  sodium chloride infusion  250 mL Intravenous PRN  Purcell Nails, MD 20 mL/hr at 10/16/14 0400 250 mL at 10/16/14 0400  . acetaminophen (TYLENOL) tablet 1,000 mg  1,000 mg Oral 4 times per day Purcell Nails, MD   1,000 mg at 10/16/14 0000  . amiodarone (PACERONE) tablet 400 mg  400 mg Oral Q8H Purcell Nails, MD      . antiseptic oral rinse (CPC / CETYLPYRIDINIUM CHLORIDE 0.05%) solution 7 mL  7 mL Mouth Rinse q12n4p Purcell Nails, MD   7 mL at 10/15/14 1700  . aspirin EC tablet 325 mg  325 mg Oral Daily Purcell Nails, MD   325 mg at 10/16/14 0933  . [START ON 10/17/2014] atorvastatin (LIPITOR) tablet 80 mg  80 mg Oral q1800 Purcell Nails, MD      . bisacodyl (DULCOLAX) EC tablet 10 mg  10 mg Oral Daily Purcell Nails, MD   10 mg at 10/16/14 1610   Or  . bisacodyl (DULCOLAX) suppository 10 mg  10 mg Rectal Daily Purcell Nails, MD      . chlorhexidine (PERIDEX) 0.12 % solution 15 mL  15 mL Mouth Rinse BID Purcell Nails, MD   15 mL at 10/16/14 0743  . dextromethorphan-guaiFENesin (MUCINEX DM) 30-600 MG per 12 hr tablet 2 tablet  2 tablet Oral BID Purcell Nails, MD   2 tablet at 10/16/14 0932  . docusate sodium (COLACE) capsule 200 mg  200 mg Oral Daily Purcell Nails, MD   200 mg at 10/16/14 0933  . enoxaparin (LOVENOX) injection 40 mg  40 mg Subcutaneous Q24H Purcell Nails, MD   40 mg at 10/16/14 0743  . furosemide (LASIX) injection 40 mg  40 mg Intravenous Q6H Purcell Nails, MD   40 mg at 10/16/14 0931  . [START ON 10/17/2014] furosemide (LASIX) tablet 80 mg  80 mg Oral BID Purcell Nails, MD      . insulin aspart (novoLOG) injection 0-20 Units  0-20 Units Subcutaneous TID WC Purcell Nails, MD   3 Units at 10/16/14 0930  . insulin aspart (novoLOG) injection 0-5 Units  0-5 Units Subcutaneous QHS Purcell Nails, MD      . insulin detemir (LEVEMIR) injection 40 Units  40 Units Subcutaneous BID Purcell Nails, MD   40 Units at 10/16/14 0933  . lisinopril (PRINIVIL,ZESTRIL) tablet 10 mg  10 mg Oral Daily Purcell Nails, MD   10  mg at 10/16/14 0932  . metoprolol (LOPRESSOR) injection 2.5-5 mg  2.5-5 mg Intravenous Q2H PRN Purcell Nails, MD   5 mg at 10/16/14 0943  . metoprolol (LOPRESSOR) tablet 50 mg  50 mg Oral BID Purcell Nails, MD   50 mg at 10/16/14 0932  . mupirocin ointment (BACTROBAN) 2 % 1 application  1 application Nasal BID Purcell Nails, MD   1 application at 10/16/14 [phone]  . ondansetron (ZOFRAN) injection 4 mg  4 mg Intravenous Q6H PRN Salvatore Decent  Cornelius Moras, MD      . oxyCODONE (Oxy IR/ROXICODONE) immediate release tablet 5-10 mg  5-10 mg Oral Q3H PRN Purcell Nails, MD   10 mg at 10/16/14 0423  . pantoprazole (PROTONIX) EC tablet 40 mg  40 mg Oral Daily Purcell Nails, MD   40 mg at 10/16/14 0933  . phenol (CHLORASEPTIC) mouth spray 1 spray  1 spray Mouth/Throat PRN Loreli Slot, MD      . potassium chloride 10 mEq in 50 mL *CENTRAL LINE* IVPB  10 mEq Intravenous Q1 Hr x 4 Purcell Nails, MD   10 mEq at 10/16/14 0900  . sodium chloride 0.9 % injection 3 mL  3 mL Intravenous Q12H Purcell Nails, MD   3 mL at 10/15/14 2200  . sodium chloride 0.9 % injection 3 mL  3 mL Intravenous PRN Purcell Nails, MD      . sodium chloride 0.9 % injection 3 mL  3 mL Intravenous Q12H Purcell Nails, MD   3 mL at 10/15/14 1016  . sodium chloride 0.9 % injection 3 mL  3 mL Intravenous PRN Purcell Nails, MD      . traMADol Janean Sark) tablet 50-100 mg  50-100 mg Oral Q4H PRN Purcell Nails, MD   100 mg at 10/16/14 0955    LABS: Lab Results  Component Value Date   WBC 13.0* 10/16/2014   HGB 9.1* 10/16/2014   HCT 27.9* 10/16/2014   MCV 86.1 10/16/2014   PLT 189 10/16/2014      Component Value Date/Time   NA 135 10/16/2014 0425   K 3.8 10/16/2014 0425   CL 99 10/16/2014 0425   CO2 28 10/16/2014 0425   GLUCOSE 149* 10/16/2014 0425   BUN 17 10/16/2014 0425   CREATININE 1.05 10/16/2014 0425   CALCIUM 8.2* 10/16/2014 0425   GFRNONAA 86* 10/16/2014 0425   GFRAA >90 10/16/2014 0425   Lab Results   Component Value Date   INR 1.40 10/13/2014   INR 1.05 10/12/2014   INR 1.01 10/11/2014   No results found for: PTT  SOCIAL HISTORY: History   Social History  . Marital Status: Married    Spouse Name: N/A    Number of Children: N/A  . Years of Education: N/A   Occupational History  . Not on file.   Social History Main Topics  . Smoking status: Former Smoker    Quit date: 10/12/2007  . Smokeless tobacco: Not on file  . Alcohol Use: No  . Drug Use: No  . Sexual Activity: Not on file   Other Topics Concern  . Not on file   Social History Narrative   Lives w/ wife and 51 yr old daughter in [place], full time student at [employer]    FAMILY HISTORY: Family History  Problem Relation Age of Onset  . Heart attack Brother 37    REVIEW OF SYSTEMS: Reviewed from chart for this admission.  DENTAL HISTORY: CHIEF COMPLAINT: The patient was referred by Dr. Cornelius Moras for a dental consultation.  HPI: Theoden Mauch is a 44 year old male recently diagnosed with acute myocardial infarction and coronary artery disease. Patient underwent an emergent four-vessel coronary artery bypass graft procedure with Dr. Cornelius Moras on Monday, 10/13/2014. Patient was noted to have chronic, advanced periodontal disease and poor dentition. Patient was then referred for a dental evaluation of his poor dentition and to provide treatment is indicated during this admission. Patient is now seen to rule out dental infection that  may affect the patient's systemic health.  Patient currently denies acute toothaches, swellings, or abscesses. Patient has not seen a dentist for over 20 years due to economic concerns.  Patient denies having any partial dentures. Patient knows that he has many bad teeth and wants to" get all my teeth pulled".  DENTAL EXAMINATION: GENERAL: The patient is well-developed, obese male in no acute distress. HEAD AND NECK: There is no palpable submandibular lymphadenopathy. The patient denies acute TMJ  symptoms. INTRAORAL EXAM: Patient has normal saliva. I do not see any evidence of oral abscess formation. DENTITION: Patient is missing multiple teeth. The patient has multiple retained root segments. I will need an orthopantogram to identify the exact tooth numbers present/missing. PERIODONTAL: Patient with chronic, advanced periodontal disease with plaque and calculus accumulations, generalized gingival recession, and generalized tooth mobility. DENTAL CARIES/SUBOPTIMAL RESTORATIONS: Patient has multiple dental caries noted. ENDODONTIC: Patient currently denies acute pulpitis symptoms. I will need to review dental radiographs for evaluation for possible periapical pathology. CROWN AND BRIDGE: There are no crown or bridge restorations noted. PROSTHODONTIC: Patient denies having any partial dentures. OCCLUSION: The patient has a poor occlusal scheme secondary to multiple missing teeth, multiple retained root segments, and lack of replacement of missing teeth with dental prostheses.  RADIOGRAPHIC INTERPRETATION: An orthopantogram has been ordered but not taken at this time.   ASSESSMENTS: 1. History of acute myocardial infarction 2. Coronary artery disease-status post 4 vessel coronary artery bypass graft on 10/13/2014 with Dr. Cornelius Moraswen. 3. Chronic periodontitis with bone loss 4. Generalized gingival recession 5. Generalized tooth mobility 6. Multiple missing teeth 7. Multiple retained root segments 8. Multiple dental caries 9. Supra-eruption and drifting of the unopposed teeth into the edentulous areas 10. Poor occlusal scheme and malocclusion 11. History of oral neglect 12. Risk for complications up to and including death with anticipated invasive dental procedures due to overall cardiovascular compromise.  PLAN/RECOMMENDATIONS: 1. I discussed the risks, benefits, and complications of various treatment options with the patient in relationship to his medical and dental conditions, recent  stroke, and recent coronary artery bypass graft procedure. We discussed various treatment options to include no treatment, multiple extractions with alveoloplasty, pre-prosthetic surgery as indicated, periodontal therapy, dental restorations, root canal therapy, crown and bridge therapy, implant therapy, and replacement of missing teeth as indicated. The patient is interested in proceeding with extraction of all remaining teeth with alveoloplasty and pre-prosthetic surgery as indicated in the operating room with general anesthesia. Per Dr. Orvan Julywen's note, Dr. Cornelius Moraswen would like the dental surgery to take place during this admission. The patient currently needs to have an orthopantogram taken and I will schedule operating room procedure once clearance from Dr. Cornelius Moraswen is obtained. The patient will then follow-up with a dentist of his choice for fabrication of upper and lower complete dentures after adequate healing and once medically stable from his coronary artery bypass graft procedure and stroke.    2. Discussion of findings with medical team and coordination of future medical and dental care as needed.   Charlynne Panderonald F. Galvin Aversa, DDS

## 2014-10-17 ENCOUNTER — Inpatient Hospital Stay (HOSPITAL_COMMUNITY): Payer: 59

## 2014-10-17 DIAGNOSIS — I5043 Acute on chronic combined systolic (congestive) and diastolic (congestive) heart failure: Secondary | ICD-10-CM

## 2014-10-17 LAB — GLUCOSE, CAPILLARY
GLUCOSE-CAPILLARY: 154 mg/dL — AB (ref 70–99)
GLUCOSE-CAPILLARY: 134 mg/dL — AB (ref 70–99)
Glucose-Capillary: 115 mg/dL — ABNORMAL HIGH (ref 70–99)

## 2014-10-17 LAB — TROPONIN I: Troponin I: 1.99 ng/mL (ref <0.031)

## 2014-10-17 LAB — CK TOTAL AND CKMB (NOT AT ARMC)
CK TOTAL: 64 U/L (ref 7–232)
CK, MB: 2.1 ng/mL (ref 0.3–4.0)
RELATIVE INDEX: INVALID (ref 0.0–2.5)

## 2014-10-17 MED ORDER — LIVING WELL WITH DIABETES BOOK
Freq: Once | Status: AC
  Administered 2014-10-17: 16:00:00
  Filled 2014-10-17: qty 1

## 2014-10-17 MED ORDER — INSULIN STARTER KIT- SYRINGES (ENGLISH)
1.0000 | Freq: Once | Status: AC
  Administered 2014-10-17: 1
  Filled 2014-10-17: qty 1

## 2014-10-17 NOTE — Clinical Social Work Psychosocial (Signed)
Clinical Social Work Department BRIEF PSYCHOSOCIAL ASSESSMENT 10/17/2014  Patient:  Mark Leach,Mark Leach     Account Number:  [id number]     Admit date:  10/10/2014  Clinical Social Worker:  Merlyn LotHOLOMAN,Chace Klippel, CLINICAL SOCIAL WORKER  Date/Time:  10/17/2014 02:15 PM  Referred by:  Physician  Date Referred:  10/17/2014 Referred for  SNF Placement   Other Referral:   Interview type:  Patient Other interview type:    PSYCHOSOCIAL DATA Living Status:  WIFE Admitted from facility:   Level of care:   Primary support name:  Haywood PaoKathryn Arnolld Primary support relationship to patient:  SPOUSE Degree of support available:   high    CURRENT CONCERNS Current Concerns  Post-Acute Placement   Other Concerns:    SOCIAL WORK ASSESSMENT / PLAN CSW spoke with patient at bedside concerning eventual SNF placement.  Patient is agreeable and states that [facility] [place] and is agreeable to returning there.  Previous to this admission patient was at home wiht wife and daughter. CSW will continue to follow.   Assessment/plan status:  Psychosocial Support/Ongoing Assessment of Needs Other assessment/ plan:   FL2 update   Information/referral to community resources:    PATIENT'S/FAMILY'S RESPONSE TO PLAN OF CARE: Patient is agreeable to SNF placement but is hopeful that he can recover quickly and return to his family.       Merlyn LotJenna Holoman, LCSWA Clinical Social Worker [phone]

## 2014-10-17 NOTE — Progress Notes (Addendum)
[address]       [place] [address]             [phone]      4 Days Post-Op Procedure(s) (LRB): CORONARY ARTERY BYPASS GRAFTING (CABG), ON PUMP, TIMES FOUR, USING LEFT INTERNAL MAMMARY ARTERY, BILATERAL GREATER SAPHENOUS VEINS HARVESTED ENDOSCOPICALLY (N/A) Subjective: Had some rapid afib, now in sinus tachy low 100's Remains weak and deconditioned  Objective: Vital signs in last 24 hours: Temp:  [98.3 F (36.8 C)-99.5 F (37.5 C)] 98.9 F (37.2 C) (01/08 0500) Pulse Rate:  [79-123] 105 (01/08 0500) Cardiac Rhythm:  [-] Sinus tachycardia (01/07 2130) Resp:  [16-44] 20 (01/08 0500) BP: (99-133)/(56-104) 109/70 mmHg (01/08 0500) SpO2:  [91 %-100 %] 98 % (01/08 0500) Weight:  [339 lb 4.6 oz (153.9 kg)] 339 lb 4.6 oz (153.9 kg) (01/08 0203)  Hemodynamic parameters for last 24 hours:    Intake/Output from previous day: 01/07 0701 - 01/08 0700 In: 1020 [P.O.:660; I.V.:160; IV Piggyback:200] Out: 2975 [Urine:2975] Intake/Output this shift:    General appearance: alert, cooperative, fatigued and no distress Heart: regular rate and rhythm and tachy Lungs: dim in bases Abdomen: soft, non-tender Extremities: + LE edema Wound: incis healing well  Lab Results:  Recent Labs  10/15/14 0407 10/16/14 0425  WBC 11.3* 13.0*  HGB 8.9* 9.1*  HCT 26.2* 27.9*  PLT 122* 189   BMET:  Recent Labs  10/15/14 0407 10/16/14 0425  NA 133* 135  K 4.3 3.8  CL 103 99  CO2 23 28  GLUCOSE 174* 149*  BUN 13 17  CREATININE 0.94 1.05  CALCIUM 8.0* 8.2*    PT/INR: No results for input(s): LABPROT, INR in the last 72 hours. ABG    Component Value Date/Time   PHART 7.402 10/14/2014 1018   HCO3 19.8* 10/14/2014 1018   TCO2 16 10/14/2014 1639   ACIDBASEDEF 4.0* 10/14/2014 1018   O2SAT 94.0 10/14/2014 1018   CBG (last 3)   Recent Labs  10/16/14 1617 10/16/14 2201 10/17/14 0633  GLUCAP 141* 165* 115*    Meds Scheduled Meds: . acetaminophen   1,000 mg Oral 4 times per day  . amiodarone  400 mg Oral 3 times per day  . antiseptic oral rinse  7 mL Mouth Rinse q12n4p  . aspirin EC  325 mg Oral Daily  . atorvastatin  80 mg Oral q1800  . bisacodyl  10 mg Oral Daily   Or  . bisacodyl  10 mg Rectal Daily  . chlorhexidine  15 mL Mouth Rinse BID  . dextromethorphan-guaiFENesin  2 tablet Oral BID  . docusate sodium  200 mg Oral Daily  . enoxaparin (LOVENOX) injection  40 mg Subcutaneous Q24H  . furosemide  80 mg Oral BID  . insulin aspart  0-20 Units Subcutaneous TID WC  . insulin aspart  0-5 Units Subcutaneous QHS  . insulin detemir  40 Units Subcutaneous BID  . lisinopril  10 mg Oral Daily  . metoprolol tartrate  50 mg Oral BID  . mupirocin ointment  1 application Nasal BID  . pantoprazole  40 mg Oral Daily  . sodium chloride  3 mL Intravenous Q12H  . sodium chloride  3 mL Intravenous Q12H   Continuous Infusions: . sodium chloride     PRN Meds:.sodium chloride, metoprolol, ondansetron (ZOFRAN) IV, oxyCODONE, phenol, sodium chloride, sodium chloride, traMADol  Xrays Dg Orthopantogram  10/17/2014   CLINICAL DATA:  Dental caries.  EXAM: ORTHOPANTOGRAM/PANORAMIC  COMPARISON:  None.  FINDINGS: There are numerous dental caries with extensive bone loss around the roots of all of the teeth with multiple periapical abscesses in the maxilla and mandible. Some of the teeth are "free floating" .  IMPRESSION: Extensive severe periodontal disease with periapical abscesses, extensive bone loss, and dental caries.   Electronically Signed   By: Geanie CooleyJim  Maxwell M.D.   On: 10/17/2014 08:08   Dg Chest 2 View  10/17/2014   CLINICAL DATA:  Shortness of breath, dental caries, weakness  EXAM: CHEST  2 VIEW  COMPARISON:  10/16/2014  FINDINGS: Cardiomediastinal silhouette is stable. Status post CABG. Right IJ central line is unchanged in position. Again noted triangular shape atelectasis in left midlung. Trace left pleural effusion with left basilar  atelectasis. No pulmonary edema. No segmental infiltrate.  IMPRESSION: Status post CABG. Stable right IJ central line position. Again noted atelectasis left midlung perihilar region. No segmental infiltrate. Trace left pleural effusion with left basilar atelectasis.   Electronically Signed   By: Natasha MeadLiviu  Pop M.D.   On: 10/17/2014 08:06   Dg Chest Port 1 View  10/16/2014   CLINICAL DATA:  CABG.  Atelectasis.  EXAM: PORTABLE CHEST - 1 VIEW  COMPARISON:  10/15/2014.  FINDINGS: Interval removal of left chest tube and mediastinal drainage catheter. Right IJ line stable position. Stable cardiomegaly. Normal pulmonary vascularity . Prior CABG. Mild left mid and left lower lung subsegmental atelectasis. Tiny left effusion cannot be excluded. No pneumothorax.  IMPRESSION: 1. Interim removal of left chest tube and mediastinal drainage catheter. Right IJ line stable position . 2. Stable cardiomegaly.  Prior CABG.  No CHF. 3. Stable left mid and lower lung subsegmental atelectasis. Tiny left pleural effusion cannot be excluded. No pneumothorax.   Electronically Signed   By: Maisie Fushomas  Register   On: 10/16/2014 07:43    Assessment/Plan: S/P Procedure(s) (LRB): CORONARY ARTERY BYPASS GRAFTING (CABG), ON PUMP, TIMES FOUR, USING LEFT INTERNAL MAMMARY ARTERY, BILATERAL GREATER SAPHENOUS VEINS HARVESTED ENDOSCOPICALLY (N/A)  1 back from xray for teeth- dental work before discharge probably- has extensive dental disease 2 afib with RVR- now in sinus tachy- monitor on amio and beta blocker 3 sugar control is improving 4 CXR appearance is stable 5 cont diuretics for volume overload 6 recheck labs in am 7 PT consult  LOS: 7 days    GOLD,WAYNE E 10/17/2014  I have seen and examined the patient and agree with the assessment and plan as outlined.  Will tentatively plan for dental extraction early next week prior to discharge per Dr Robin SearingKulinsky.  Restart metformin and decrease levemir tomorrow if renal function stable.  Diabetes  management consult to obtain glucometer for home testing and establish plan for diabetes follow up.  Orlo Brickle H 10/17/2014 12:56 PM

## 2014-10-17 NOTE — Progress Notes (Signed)
Physical Therapy Treatment Patient Details Name: Mark Leach MRN: [id number] DOB: [birth year] Today's Date: 10/17/2014    History of Present Illness 44 y.o. male s/p CORONARY ARTERY BYPASS GRAFTING (CABG), ON PUMP, TIMES FOUR, USING LEFT INTERNAL MAMMARY ARTERY, BILATERAL GREATER SAPHENOUS VEINS HARVESTED ENDOSCOPICALLY    PT Comments    Making progress towards physical therapy goals. Ambulates very slowly up to 12 feet today with min assist. Required min assist +2 for bed mobility and transfers. Motivated to work hard with therapy and tolerated therapeutic exercises well. Continues to require frequent cues to maintain sternal precautions. Patient will continue to benefit from skilled physical therapy services to further improve independence with functional mobility.   Follow Up Recommendations  CIR (If denied by CIR, pt will require SNF for Rehab)     Equipment Recommendations   (TBD)    Recommendations for Other Services Rehab consult     Precautions / Restrictions Precautions Precautions: Sternal;Fall Restrictions Weight Bearing Restrictions: Yes Other Position/Activity Restrictions: NWB UEs sternal precautions    Mobility  Bed Mobility Overal bed mobility: Needs Assistance;+2 for physical assistance Bed Mobility: Rolling;Sidelying to Sit Rolling: Min assist Sidelying to sit: Min assist;+2 for physical assistance;HOB elevated       General bed mobility comments: Min assist for sequencing LEs off of bed with rolling onto side while being educated on log roll technique. Required min assist +2 for truncal control into seated position from [facility] elevated position  Transfers Overall transfer level: Needs assistance Equipment used: Rolling walker (2 wheeled) Transfers: Sit to/from Stand Sit to Stand: Min assist;+2 physical assistance;From elevated surface         General transfer comment: Min assist +2 for boost from elevated bed surface. Max VC for hand placement and to  hold pillow in order to maintain sternal precautions.   Ambulation/Gait Ambulation/Gait assistance: Min assist;+2 safety/equipment Ambulation Distance (Feet): 12 Feet Assistive device: Rolling walker (2 wheeled) Gait Pattern/deviations: Step-through pattern;Decreased stride length;Drifts right/left;Trunk flexed;Wide base of support Gait velocity: decreased   General Gait Details: Very slow and guarded. Required several resting breaks to complete this short distance. No instance of knees buckling noted. Min assist provided for walker control with frequent cues for upright posture and to keep eyes open. Second person followed with chair.   Stairs            Wheelchair Mobility    Modified Rankin (Stroke Patients Only)       Balance                                    Cognition Arousal/Alertness: Awake/alert Behavior During Therapy: WFL for tasks assessed/performed Overall Cognitive Status: Within Functional Limits for tasks assessed                      Exercises General Exercises - Lower Extremity Ankle Circles/Pumps: AROM;Both;10 reps;Seated Quad Sets: Strengthening;Both;10 reps;Seated Gluteal Sets: Strengthening;Both;10 reps;Seated Straight Leg Raises: Strengthening;AAROM;Both;10 reps;Seated    General Comments        Pertinent Vitals/Pain Pain Assessment: 0-10 Pain Score:  ("only when I cough") Pain Location: chest Pain Intervention(s): Monitored during session;Repositioned  HR 100-112 SpO2 94% on room air    Home Living                      Prior Function            PT Goals (  current goals can now be found in the care plan section) Acute Rehab PT Goals Patient Stated Goal: Get better PT Goal Formulation: With patient Time For Goal Achievement: 10/29/14 Potential to Achieve Goals: Good Progress towards PT goals: Progressing toward goals    Frequency  Min 3X/week    PT Plan Current plan remains appropriate     Co-evaluation             End of Session Equipment Utilized During Treatment: Gait belt Activity Tolerance: Patient limited by fatigue Patient left: with call bell/phone within reach;in chair;with chair alarm set     Time: 1340-1414 PT Time Calculation (min) (ACUTE ONLY): 34 min  Charges:  $Therapeutic Exercise: 8-22 mins $Therapeutic Activity: 8-22 mins                    G Codes:      Berton MountBarbour, Mark Leach S 10/17/2014, 2:39 PM Sunday SpillersLogan Secor Princeton[place]bour, [place] CarolinaPT [phone]

## 2014-10-17 NOTE — Progress Notes (Signed)
Referral received.  Note that patient will likely discharge to SNF initially.  Spoke to patient briefly regarding probable need for insulin at discharge.  Told him that I had ordered an insulin starter kit to begin insulin education and "Living Well with Diabetes" booklet.  May consider reducing Levemir to q day dosing instead of BID.  Consider Levemir 45 units once a day for ease of administration.  He states he has a meter at home with strips.  Encouraged him to follow-up with his provider at [facility].   Discussed with RN.  Adah Perl, RN, BC-ADM Inpatient Diabetes Coordinator Pager [phone]

## 2014-10-17 NOTE — Progress Notes (Signed)
    Subjective:  Denies dyspnea; chest sore   Objective:  Filed Vitals:   10/16/14 2000 10/16/14 2123 10/17/14 0203 10/17/14 0500  BP: 115/69 103/61  109/70  Pulse: 117 106  105  Temp:  98.7 F (37.1 C)  98.9 F (37.2 C)  TempSrc:  Oral  Oral  Resp: 16 18  20   Height:      Weight:   339 lb 4.6 oz (153.9 kg)   SpO2: 97% 93%  98%    Intake/Output from previous day:  Intake/Output Summary (Last 24 hours) at 10/17/14 0830 Last data filed at 10/17/14 0545  Gross per 24 hour  Intake    950 ml  Output   2875 ml  Net  -1925 ml    Physical Exam: Physical exam: Well-developed obese in no acute distress.  Skin is warm and dry.  HEENT is normal.  Neck is supple.  Chest is clear to auscultation with normal expansion. S/p sternotomy Cardiovascular exam is regular rate and rhythm.  Abdominal exam nontender or distended. No masses palpated. Extremities show trace to 1+ edema. neuro grossly intact    Lab Results: Basic Metabolic Panel:  Recent Labs  40/98/1100/02/22 1345  10/15/14 0407 10/16/14 0425  NA  --   < > 133* 135  K  --   < > 4.3 3.8  CL  --   < > 103 99  CO2  --   --  23 28  GLUCOSE  --   < > 174* 149*  BUN  --   < > 13 17  CREATININE 1.00  < > 0.94 1.05  CALCIUM  --   --  8.0* 8.2*  MG 2.0  --   --   --   < > = values in this interval not displayed. CBC:  Recent Labs  10/15/14 0407 10/16/14 0425  WBC 11.3* 13.0*  HGB 8.9* 9.1*  HCT 26.2* 27.9*  MCV 87.0 86.1  PLT 122* 189   Cardiac Enzymes:  Recent Labs  10/15/14 0117 10/15/14 1345 10/17/14 0429  CKTOTAL 190 126 64  CKMB 6.5* 3.7 2.1  TROPONINI 3.46* 2.79* 1.99*     Assessment/Plan:  1 s/p CABG 2 postoperative volume excess/acute systolic CHF- remains volume overloaded but improving; continue diuresis and follow renal function 3 s/p MI-continue ASA, metoprolol and statin 4 ICM-EF 25-30-continue metoprolol and lisinopril; titrate meds as tolerated by BP; at DC transition to toprol. Reassess  LV function in 3 months; if EF < 35, would need referral for ICD. 5 Hyperlipidemia-continue lipitor 80 mg daily; lipids and liver in six weeks 6 Poorly controlled DM-continue present meds; will need close fu with primary care following DC for further management. 7 Postoperative atrial fibrillation-transient atrial fibrillation noted on telemetry. Now back in sinus rhythm. Continue amiodarone and metoprolol.  [facility] (Dr Wyline MoodBranch, Dr Purvis SheffieldKoneswaran or Dr Diona BrownerMcDowell[facility] 10/17/2014, 8:30 AM

## 2014-10-17 NOTE — Clinical Social Work Placement (Signed)
Clinical Social Work Department CLINICAL SOCIAL WORK PLACEMENT NOTE 10/17/2014  Patient:  Mark DukesRNOLD,[name]  Account Number:  [id number] Admit date:  10/10/2014  Clinical Social Worker:  Merlyn LotJENNA HOLOMAN, CLINICAL SOCIAL WORKER  Date/time:  10/17/2014 04:23 PM  Clinical Social Work is seeking post-discharge placement for this patient at the following level of care:   SKILLED NURSING   (*CSW will update this form in Epic as items are completed)   10/17/2014  Patient/family provided with [facility] Department of Clinical Social Work's list of facilities offering this level of care within the geographic area requested by the patient (or if unable, by the patient's family).  10/17/2014  Patient/family informed of their freedom to choose among providers that offer the needed level of care, that participate in Medicare, Medicaid or managed care program needed by the patient, have an available bed and are willing to accept the patient.  10/17/2014  Patient/family informed of [facility]' ownership interest in [facility], as well as of the fact that they are under no obligation to receive care at this facility.  PASARR submitted to EDS on 10/17/2014 PASARR number received on 10/17/2014  FL2 transmitted to all facilities in geographic area requested by pt/family on  10/17/2014 FL2 transmitted to all facilities within larger geographic area on   Patient informed that his/her managed care company has contracts with or will negotiate with  certain facilities, including the following:     Patient/family informed of bed offers received:   Patient chooses bed at  Physician recommends and patient chooses bed at    Patient to be transferred to  on   Patient to be transferred to facility by  Patient and family notified of transfer on  Name of family member notified:    The following physician request were entered in Epic:   Additional Comments: Merlyn LotJenna Holoman, [facility] Clinical Social  Worker [phone]

## 2014-10-18 LAB — COMPREHENSIVE METABOLIC PANEL
ALT: 29 U/L (ref 0–53)
ANION GAP: 9 (ref 5–15)
AST: 31 U/L (ref 0–37)
Albumin: 2.3 g/dL — ABNORMAL LOW (ref 3.5–5.2)
Alkaline Phosphatase: 432 U/L — ABNORMAL HIGH (ref 39–117)
BUN: 24 mg/dL — ABNORMAL HIGH (ref 6–23)
CALCIUM: 7.8 mg/dL — AB (ref 8.4–10.5)
CO2: 30 mmol/L (ref 19–32)
Chloride: 97 mEq/L (ref 96–112)
Creatinine, Ser: 0.98 mg/dL (ref 0.50–1.35)
GFR calc non Af Amer: >90 mL/min (ref >90)
Glucose, Bld: 99 mg/dL (ref 70–99)
POTASSIUM: 3.5 mmol/L (ref 3.5–5.1)
Sodium: 136 mmol/L (ref 135–145)
Total Bilirubin: 1.3 mg/dL — ABNORMAL HIGH (ref 0.3–1.2)
Total Protein: 5.6 g/dL — ABNORMAL LOW (ref 6.0–8.3)

## 2014-10-18 LAB — CBC
HCT: 26.5 % — ABNORMAL LOW (ref 39.0–52.0)
Hemoglobin: 8.6 g/dL — ABNORMAL LOW (ref 13.0–17.0)
MCH: 28.2 pg (ref 26.0–34.0)
MCHC: 32.5 g/dL (ref 30.0–36.0)
MCV: 86.9 fL (ref 78.0–100.0)
Platelets: 271 10*3/uL (ref 150–400)
RBC: 3.05 MIL/uL — AB (ref 4.22–5.81)
RDW: 13.8 % (ref 11.5–15.5)
WBC: 12.1 10*3/uL — ABNORMAL HIGH (ref 4.0–10.5)

## 2014-10-18 LAB — GLUCOSE, CAPILLARY
GLUCOSE-CAPILLARY: 127 mg/dL — AB (ref 70–99)
GLUCOSE-CAPILLARY: 156 mg/dL — AB (ref 70–99)
Glucose-Capillary: 111 mg/dL — ABNORMAL HIGH (ref 70–99)
Glucose-Capillary: 84 mg/dL (ref 70–99)
Glucose-Capillary: 133 mg/dL — ABNORMAL HIGH (ref 70–99)

## 2014-10-18 MED ORDER — OXYCODONE HCL 5 MG PO TABS
5.0000 mg | ORAL_TABLET | ORAL | Status: DC | PRN
  Administered 2014-10-22 – 2014-10-24 (×3): 5 mg via ORAL
  Administered 2014-10-24 – 2014-10-25 (×2): 10 mg via ORAL
  Filled 2014-10-18 (×3): qty 1
  Filled 2014-10-18 (×2): qty 2

## 2014-10-18 MED ORDER — SODIUM CHLORIDE 0.9 % IJ SOLN
10.0000 mL | Freq: Two times a day (BID) | INTRAMUSCULAR | Status: DC
  Administered 2014-10-22 – 2014-10-24 (×2): 10 mL

## 2014-10-18 MED ORDER — SODIUM CHLORIDE 0.9 % IJ SOLN: 10.0000 mL | INTRAMUSCULAR | Status: DC | PRN

## 2014-10-18 MED ORDER — METFORMIN HCL 500 MG PO TABS
500.0000 mg | ORAL_TABLET | Freq: Two times a day (BID) | ORAL | Status: DC
  Administered 2014-10-18 – 2014-10-26 (×16): 500 mg via ORAL
  Filled 2014-10-18 (×19): qty 1

## 2014-10-18 MED ORDER — INSULIN DETEMIR 100 UNIT/ML ~~LOC~~ SOLN
45.0000 [IU] | Freq: Every day | SUBCUTANEOUS | Status: DC
  Administered 2014-10-18 – 2014-10-20 (×3): 45 [IU] via SUBCUTANEOUS
  Filled 2014-10-18 (×4): qty 0.45

## 2014-10-18 NOTE — Progress Notes (Addendum)
      [address]       [place] [id number]             [phone]      5 Days Post-Op Procedure(s) (LRB): CORONARY ARTERY BYPASS GRAFTING (CABG), ON PUMP, TIMES FOUR, USING LEFT INTERNAL MAMMARY ARTERY, BILATERAL GREATER SAPHENOUS VEINS HARVESTED ENDOSCOPICALLY (N/A)   Subjective:  Patient sleepy this morning.  Had to wake each time I asked a question.  States he feels okay.  Limited mobility only able to ambulate to door.  No BM  Objective: Vital signs in last 24 hours: Temp:  [98.2 F (36.8 C)-99 F (37.2 C)] 98.6 F (37 C) (01/09 0527) Pulse Rate:  [101-115] 101 (01/09 0527) Cardiac Rhythm:  [-] Sinus tachycardia (01/08 1950) Resp:  [18-20] 18 (01/09 0527) BP: (104-129)/(52-72) 108/67 mmHg (01/09 0527) SpO2:  [92 %-94 %] 93 % (01/09 0527) Weight:  [334 lb (151.501 kg)] 334 lb (151.501 kg) (01/09 0636)  Intake/Output from previous day: 01/08 0701 - 01/09 0700 In: 720 [P.O.:720] Out: 2400 [Urine:2400] Intake/Output this shift: Total I/O In: -  Out: 100 [Urine:100]  General appearance: alert, cooperative and no distress Heart: regular rate and rhythm Lungs: clear to auscultation bilaterally Abdomen: soft, non-tender; bowel sounds normal; no masses,  no organomegaly Extremities: edema trace Wound: clean and dry  Lab Results:  Recent Labs  10/16/14 0425 10/18/14 0530  WBC 13.0* 12.1*  HGB 9.1* 8.6*  HCT 27.9* 26.5*  PLT 189 271   BMET:  Recent Labs  10/16/14 0425 10/18/14 0530  NA 135 136  K 3.8 3.5  CL 99 97  CO2 28 30  GLUCOSE 149* 99  BUN 17 24*  CREATININE 1.05 0.98  CALCIUM 8.2* 7.8*    PT/INR: No results for input(s): LABPROT, INR in the last 72 hours. ABG    Component Value Date/Time   PHART 7.402 10/14/2014 1018   HCO3 19.8* 10/14/2014 1018   TCO2 16 10/14/2014 1639   ACIDBASEDEF 4.0* 10/14/2014 1018   O2SAT 94.0 10/14/2014 1018   CBG (last 3)   Recent Labs  10/17/14 1625 10/17/14 2220 10/18/14 0630  GLUCAP  134* 111* 84    Assessment/Plan: S/P Procedure(s) (LRB): CORONARY ARTERY BYPASS GRAFTING (CABG), ON PUMP, TIMES FOUR, USING LEFT INTERNAL MAMMARY ARTERY, BILATERAL GREATER SAPHENOUS VEINS HARVESTED ENDOSCOPICALLY (N/A)  1. CV-Previous A. Fib, currently Sinus Tach- continue Amiodarone, Lopressor 2. Pulm- no acute issues, continue IS 3. Renal- creatinine WNL, continue lasix 4. DM- consult obtained, decrease Levemir to 45 mg daily per recs, restart Metformin 5. Poor Dentition- full dental extraction next week sometime per Dr. Kristin BruinsKulinski 6. Deconditioning- patient needs CIR at discharge 7. Dispo- patient difficult to arouse this morning, will decrease frequency of pain medication, insulin dose adjusted per recs, restarted home metformin  LOS: 8 days    Raford PitcherBARRETT, ERIN 10/18/2014   Chart reviewed, patient examined, agree with above.

## 2014-10-18 NOTE — Progress Notes (Signed)
CARDIAC REHAB PHASE I   PRE:  Rate/Rhythm: 89 SR  BP:  Sitting: 124/61      SaO2: 96 RA  MODE:  Ambulation: 150 ft   POST:  Rate/Rhythm: 107 ST  BP:  Sitting: 100/64     SaO2: 94 RA 1310-1355  Wife at bedside. Patient having periods of crying. Encouragement given. Patient assisted to standing position for voiding. Patient then ambulated with RW x 2 assist with 3 assist pushing wheelchair behind. Gait belt used. Slow steady gait noted. Patient took 1 standing rest break and 1 sitting rest break for generalized weakness and pain in legs. Post ambulation patient assisted to bed side potty. Large soft BM noted with unmeasurable void. Patient then assisted to specialized chair with call bell in reach. Primary nurse assisted with walk. Needs constant reassurance and encouragement.  Maude LerichePayne, Nochum Fenter EnglishRN, BSN 10/18/2014 1:49 PM

## 2014-10-19 LAB — GLUCOSE, CAPILLARY
GLUCOSE-CAPILLARY: 120 mg/dL — AB (ref 70–99)
GLUCOSE-CAPILLARY: 193 mg/dL — AB (ref 70–99)
GLUCOSE-CAPILLARY: 147 mg/dL — AB (ref 70–99)
Glucose-Capillary: 142 mg/dL — ABNORMAL HIGH (ref 70–99)

## 2014-10-19 LAB — CK TOTAL AND CKMB (NOT AT ARMC)
CK, MB: 1 ng/mL (ref 0.3–4.0)
RELATIVE INDEX: INVALID (ref 0.0–2.5)
Total CK: 33 U/L (ref 7–232)

## 2014-10-19 LAB — TROPONIN I: Troponin I: 0.6 ng/mL (ref <0.031)

## 2014-10-19 NOTE — Progress Notes (Signed)
EPWS  Removed per MD order and protocol. Wire ends intact. Pt tolerated procedure well. Vitals stable. Pt resting in bed X 1 hour. Call bell within reach. Bed Alarm set. Will continue to monitor.  

## 2014-10-19 NOTE — Progress Notes (Addendum)
Pt ambulated in hallway 150 ft with assist x 2 with rolling walker and tolerated activity fair. Pt took one standing rest and 2 sitting rests. Will continue to monitor.

## 2014-10-19 NOTE — Progress Notes (Addendum)
      [address]       [place] [id number]             [phone]      6 Days Post-Op Procedure(s) (LRB): CORONARY ARTERY BYPASS GRAFTING (CABG), ON PUMP, TIMES FOUR, USING LEFT INTERNAL MAMMARY ARTERY, BILATERAL GREATER SAPHENOUS VEINS HARVESTED ENDOSCOPICALLY (N/A)   Subjective:  Mr. Debroah Looprnold is more alert this morning.  He has no complaints.  He ambulated in  Hallway yesterday with max assist.  + BM  Objective: Vital signs in last 24 hours: Temp:  [98 F (36.7 C)-99.8 F (37.7 C)] 99.6 F (37.6 C) (01/10 0447) Pulse Rate:  [102-109] 103 (01/10 0447) Cardiac Rhythm:  [-] Sinus tachycardia (01/09 2030) Resp:  [18-21] 19 (01/10 0447) BP: (92-134)/(50-70) 134/68 mmHg (01/10 0447) SpO2:  [95 %-96 %] 95 % (01/10 0447) Weight:  [334 lb (151.501 kg)] 334 lb (151.501 kg) (01/10 0447)  Intake/Output from previous day: 01/09 0701 - 01/10 0700 In: 480 [P.O.:480] Out: 2250 [Urine:2250]  General appearance: alert, cooperative and no distress Heart: regular rate and rhythm and tachy Lungs: clear to auscultation bilaterally Abdomen: soft, non-tender; bowel sounds normal; no masses,  no organomegaly Extremities: edema trace Wound: clean and dry  Lab Results:  Recent Labs  10/18/14 0530  WBC 12.1*  HGB 8.6*  HCT 26.5*  PLT 271   BMET:  Recent Labs  10/18/14 0530  NA 136  K 3.5  CL 97  CO2 30  GLUCOSE 99  BUN 24*  CREATININE 0.98  CALCIUM 7.8*    PT/INR: No results for input(s): LABPROT, INR in the last 72 hours. ABG    Component Value Date/Time   PHART 7.402 10/14/2014 1018   HCO3 19.8* 10/14/2014 1018   TCO2 16 10/14/2014 1639   ACIDBASEDEF 4.0* 10/14/2014 1018   O2SAT 94.0 10/14/2014 1018   CBG (last 3)   Recent Labs  10/18/14 1639 10/18/14 2139 10/19/14 0618  GLUCAP 156* 133* 120*    Assessment/Plan: S/P Procedure(s) (LRB): CORONARY ARTERY BYPASS GRAFTING (CABG), ON PUMP, TIMES FOUR, USING LEFT INTERNAL MAMMARY ARTERY,  BILATERAL GREATER SAPHENOUS VEINS HARVESTED ENDOSCOPICALLY (N/A)  1. CV- Sinus Tach- on Lopressor and Amiodarone 2. Pulm- no acute issues, continue IS 3. Renal- creatinine has been okay, weight is 3 lbs above baseline, continue diuresis 4. DM- sugars well controlled, continue current regimen 5. Poor Dentition- dental extraction next week 6. Deconditioning- needs CIR at d/c per PT, will place consult 7. Dispo- patient more alert this morning, will d/c pacing wires, continue current insulin regimen   LOS: 9 days    BARRETT, ERIN 10/19/2014   Chart reviewed, patient examined, agree with above.

## 2014-10-20 LAB — BASIC METABOLIC PANEL
Anion gap: 11 (ref 5–15)
BUN: 27 mg/dL — ABNORMAL HIGH (ref 6–23)
CALCIUM: 8.5 mg/dL (ref 8.4–10.5)
CO2: 26 mmol/L (ref 19–32)
CREATININE: 1.27 mg/dL (ref 0.50–1.35)
Chloride: 96 mEq/L (ref 96–112)
GFR calc Af Amer: 79 mL/min — ABNORMAL LOW (ref >90)
GFR, EST NON AFRICAN AMERICAN: 68 mL/min — AB (ref >90)
Glucose, Bld: 212 mg/dL — ABNORMAL HIGH (ref 70–99)
Potassium: 4 mmol/L (ref 3.5–5.1)
Sodium: 133 mmol/L — ABNORMAL LOW (ref 135–145)

## 2014-10-20 LAB — GLUCOSE, CAPILLARY
GLUCOSE-CAPILLARY: 141 mg/dL — AB (ref 70–99)
GLUCOSE-CAPILLARY: 221 mg/dL — AB (ref 70–99)
Glucose-Capillary: 210 mg/dL — ABNORMAL HIGH (ref 70–99)
Glucose-Capillary: 141 mg/dL — ABNORMAL HIGH (ref 70–99)

## 2014-10-20 LAB — MAGNESIUM: MAGNESIUM: 2.2 mg/dL (ref 1.5–2.5)

## 2014-10-20 MED ORDER — AMIODARONE HCL 200 MG PO TABS
400.0000 mg | ORAL_TABLET | Freq: Two times a day (BID) | ORAL | Status: DC
  Administered 2014-10-20 – 2014-10-21 (×3): 400 mg via ORAL
  Filled 2014-10-20 (×4): qty 2

## 2014-10-20 NOTE — Progress Notes (Signed)
Pt ambulated approximately 150 ft with assist x1 and RW.  Pt stopped to rest multiple times during walk.  Pt needed encouragement to continue walk.  Pt to bed on return to room with call bell in reach.  Will continue to monitor closely.

## 2014-10-20 NOTE — Progress Notes (Signed)
CARDIAC REHAB PHASE I   PRE:  Rate/Rhythm: 102 ST PVCs  BP:  Supine: 124/74  Sitting:   Standing:    SaO2: 94%RA  MODE:  Ambulation: 150 ft   POST:  Rate/Rhythm: 131 ST PVCs  BP:  Supine:   Sitting: 143/77  Standing:    SaO2: 96%RA 0820-0920 Pt needed encouragement to walk. Family helped to motivate pt. He asked to sit down after a couple of steps. Pt walked 150 ft on RA with rolling walker with asst x1 and one asst to follow with wheelchair. Takes two asst to get pt to rock and stand. Pt sat twice to rest and was diaphoretic. Comfort measures given due to diaphoresis. Family had brought coffee for pt to drink. Asked family to make sure decaf and sugar free as pt having lots of PVCs.  Family had not known to bring decaf. To recliner after walk. Keep as asst x 2.   Luetta Nuttingharlene Destani Wamser, RN BSN  10/20/2014 9:17 AM

## 2014-10-20 NOTE — Progress Notes (Signed)
10/20/2014 10:17 AM Nursing note Verbal order Dr. Cornelius Moraswen during am rounds to attempt to obtain PIV and if able to do so please d/c central line. IV team notified to assist with PIV start. Pt. Updated on plan of care. Will continue to monitor patient.  Sharee Sturdy, Blanchard KelchStephanie Ingold

## 2014-10-20 NOTE — Progress Notes (Signed)
Central line d/c per order and per protocol. Pt. Tolerated well. Catheter intact. Pressure held x 5 min and occlusive dressing applied. Advised br x 30 min. Call bell within reach. Will continue to monitor patient.  Juwuan Sedita, Blanchard KelchStephanie Ingold

## 2014-10-20 NOTE — Progress Notes (Addendum)
[address]       [place] [id number]             [phone]      7 Days Post-Op Procedure(s) (LRB): CORONARY ARTERY BYPASS GRAFTING (CABG), ON PUMP, TIMES FOUR, USING LEFT INTERNAL MAMMARY ARTERY, BILATERAL GREATER SAPHENOUS VEINS HARVESTED ENDOSCOPICALLY (N/A) Subjective: Very emotional this am  Objective: Vital signs in last 24 hours: Temp:  [98.9 F (37.2 C)-99.6 F (37.6 C)] 99.5 F (37.5 C) (01/11 0444) Pulse Rate:  [103-113] 106 (01/11 0444) Cardiac Rhythm:  [-] Sinus tachycardia (01/10 2015) Resp:  [19-21] 19 (01/11 0444) BP: (96-130)/(56-76) 113/68 mmHg (01/11 0444) SpO2:  [93 %-96 %] 95 % (01/11 0444) Weight:  [323 lb 11.2 oz (146.829 kg)] 323 lb 11.2 oz (146.829 kg) (01/11 0444)  Hemodynamic parameters for last 24 hours:    Intake/Output from previous day: 01/10 0701 - 01/11 0700 In: 1080 [P.O.:1080] Out: 2400 [Urine:2400] Intake/Output this shift:    General appearance: alert, cooperative, fatigued and no distress Heart: regular rate and rhythm and frequent extrasystoles Lungs: sim in lower fields Abdomen: benign Extremities: not significantly edematous, brawny changes lower extrems Wound: incis healing well  Lab Results:  Recent Labs  10/18/14 0530  WBC 12.1*  HGB 8.6*  HCT 26.5*  PLT 271   BMET:  Recent Labs  10/18/14 0530  NA 136  K 3.5  CL 97  CO2 30  GLUCOSE 99  BUN 24*  CREATININE 0.98  CALCIUM 7.8*    PT/INR: No results for input(s): LABPROT, INR in the last 72 hours. ABG    Component Value Date/Time   PHART 7.402 10/14/2014 1018   HCO3 19.8* 10/14/2014 1018   TCO2 16 10/14/2014 1639   ACIDBASEDEF 4.0* 10/14/2014 1018   O2SAT 94.0 10/14/2014 1018   CBG (last 3)   Recent Labs  10/19/14 1609 10/19/14 2112 10/20/14 0607  GLUCAP 147* 142* 141*    Meds Scheduled Meds: . amiodarone  400 mg Oral 3 times per day  . antiseptic oral rinse  7 mL Mouth Rinse q12n4p  . aspirin EC  325 mg Oral Daily   . atorvastatin  80 mg Oral q1800  . bisacodyl  10 mg Oral Daily   Or  . bisacodyl  10 mg Rectal Daily  . chlorhexidine  15 mL Mouth Rinse BID  . dextromethorphan-guaiFENesin  2 tablet Oral BID  . docusate sodium  200 mg Oral Daily  . enoxaparin (LOVENOX) injection  40 mg Subcutaneous Q24H  . furosemide  80 mg Oral BID  . insulin aspart  0-20 Units Subcutaneous TID WC  . insulin aspart  0-5 Units Subcutaneous QHS  . insulin detemir  45 Units Subcutaneous Daily  . lisinopril  10 mg Oral Daily  . metFORMIN  500 mg Oral BID WC  . metoprolol tartrate  50 mg Oral BID  . pantoprazole  40 mg Oral Daily  . sodium chloride  10-40 mL Intracatheter Q12H  . sodium chloride  3 mL Intravenous Q12H  . sodium chloride  3 mL Intravenous Q12H   Continuous Infusions: . sodium chloride     PRN Meds:.sodium chloride, metoprolol, ondansetron (ZOFRAN) IV, oxyCODONE, phenol, sodium chloride, sodium chloride, sodium chloride, traMADol  Xrays No results found.  Assessment/Plan: S/P Procedure(s) (LRB): CORONARY ARTERY BYPASS GRAFTING (CABG), ON PUMP, TIMES FOUR, USING LEFT INTERNAL MAMMARY ARTERY, BILATERAL GREATER SAPHENOUS VEINS HARVESTED ENDOSCOPICALLY (N/A)  1 doing well, with slow steady progress 2 frequent PVC's,  reduce amio to BID,  recheck K+/Mg++. Check 12 lead /QTc 3 sugars adeq control 4 BP- good control 5 dental work this week 6 CIR at d/c  LOS: 10 days    GOLD,WAYNE E 10/20/2014  I have seen and examined the patient and agree with the assessment and plan as outlined.  OWEN,CLARENCE H 10/20/2014 8:48 AM

## 2014-10-20 NOTE — Clinical Social Work Note (Signed)
CSW provided patient with SNF choice.  Patient stated that he was not feeling well and that he felt really hot- Patient appeared red and was sweating profusely.  CSW informed RN of patient status.  CSW will continue to follow.  Merlyn LotJenna Holoman, LCSWA Clinical Social Worker [phone]

## 2014-10-20 NOTE — Progress Notes (Addendum)
10/20/2014 1730 Notifed by SW that pt. Awoke feeling "flushed" and like "something was wrong". Upon assessment, pt. Face flushed. Pt. Visibly anxious and tearful. VS collected and stable. CBG 141. Oxygen saturation 94 % RA.  pt. Denies pain or nausea. Pt. Afebrile at 98.0. Pt. Stating that he continues to struggle with "scenarios" in his mind that he knows are not real. Pt. Denies auditory or visual hallucinations. Pt. States he awoke feeling like he "was part of a science experiment". Dr. Donata ClayVan Trigt on floor and asked to evaluate patient. Orders placed by Dr. Donata ClayVan Trigt.  Dr. Cornelius Moraswen paged and made aware of same. No new orders. Greater than 30 minutes spent at bedside providing emotional support to patient. Pt. Family at bedside. Will continue to closely monitor patient.  Mark Leach, Blanchard KelchStephanie Leach

## 2014-10-20 NOTE — Progress Notes (Signed)
Physical Therapy Treatment Patient Details Name: Mark Leach MRN: [id number] DOB: 04-05-1971 Today's Date: 10/20/2014    History of Present Illness 44 y.o. male s/p CORONARY ARTERY BYPASS GRAFTING (CABG), ON PUMP, TIMES FOUR, USING LEFT INTERNAL MAMMARY ARTERY, BILATERAL GREATER SAPHENOUS VEINS HARVESTED ENDOSCOPICALLY    PT Comments    Pt progressing towards goals but he feels discouraged in his progress. Ambulated 150' with RW and min A with 4 standing rest breaks. Anxiety level is affecting his functional ability at this point. PT will continue to follow.   Follow Up Recommendations  CIR     Equipment Recommendations  Rolling walker with 5" wheels (bariatric)    Recommendations for Other Services Rehab consult     Precautions / Restrictions Precautions Precautions: Sternal;Fall Precaution Comments: reviewed sternal precautions and pt can verbalize but then does not keep functionally when getting up, verbal and tactile cues needed frequently Restrictions Weight Bearing Restrictions: No    Mobility  Bed Mobility Overal bed mobility: Needs Assistance Bed Mobility: Rolling;Sidelying to Sit Rolling: Supervision Sidelying to sit: Min assist       General bed mobility comments: pt reminded of sternal precautions before sitting up. Able to roll independently, hugging heart pillow, min A needed for trunk elevation  Transfers Overall transfer level: Needs assistance Equipment used: Rolling walker (2 wheeled) Transfers: Sit to/from Stand Sit to Stand: Min assist;+2 physical assistance         General transfer comment: needed +2 boost from bed to achieve standing, partially because of mental outlook, partially because of fatigue/ weakness.   Ambulation/Gait Ambulation/Gait assistance: Min assist Ambulation Distance (Feet): 150 Feet Assistive device: Rolling walker (2 wheeled) Gait Pattern/deviations: Step-through pattern;Wide base of support;Shuffle Gait velocity:  decreased   General Gait Details: 4 standing rest breaks taken, pt needed much encouragement in continuing and that he was doing OK and that vitals were stable. Cues for upright posture to minimize pressure through UE's.   Stairs            Wheelchair Mobility    Modified Rankin (Stroke Patients Only)       Balance Overall balance assessment: Needs assistance Sitting-balance support: No upper extremity supported;Feet supported Sitting balance-Leahy Scale: Good Sitting balance - Comments: maintained balance edge of bed to use urinal and shift wt   Standing balance support: No upper extremity supported Standing balance-Leahy Scale: Fair                      Cognition Arousal/Alertness: Lethargic Behavior During Therapy: Anxious Overall Cognitive Status: Impaired/Different from baseline                 General Comments: pt reports that the anxiety and paranoia and thoughts that he is having in his head are new for him. He reports that usually he is a very "spiritual man" but that he is really struggling right now and that the inside of his head seems like something from a "Dr. Who" movie    Exercises      General Comments General comments (skin integrity, edema, etc.): discussed anxiety with RN and possible need for psych consult to help pt through this event      Pertinent Vitals/Pain Pain Assessment: No/denies pain  HR 110 bpm after ambulation    Home Living                      Prior Function  PT Goals (current goals can now be found in the care plan section) Acute Rehab PT Goals Patient Stated Goal: Get better PT Goal Formulation: With patient Time For Goal Achievement: 10/29/14 Potential to Achieve Goals: Good Progress towards PT goals: Progressing toward goals    Frequency  Min 3X/week    PT Plan Current plan remains appropriate    Co-evaluation             End of Session Equipment Utilized During  Treatment: Gait belt Activity Tolerance: Patient tolerated treatment well Patient left: in chair;with call bell/phone within reach;with chair alarm set     Time: 1133-1207 PT Time Calculation (min) (ACUTE ONLY): 34 min  Charges:  $Gait Training: 23-37 mins                    G Codes:     Lyanne CoVictoria Blythe Veach, PT  Acute Rehab Services  [phone]  Lyanne CoManess, Akiem Urieta 10/20/2014, 2:56 PM

## 2014-10-20 NOTE — Progress Notes (Signed)
10/20/2014 8:30 AM Nursing note EKG obtained and [facility].  QTC 526. PA to place orders. Will continue to monitor patient.  Deric Bocock, Blanchard KelchStephanie Ingold

## 2014-10-20 NOTE — Progress Notes (Signed)
OT Cancellation Note  Patient Details Name: Lesly DukesBruce Mccorry MRN: [id number] DOB: January 22, 1971   Cancelled Treatment:    Reason Eval/Treat Not Completed: Other (comment)attempted skilled OT, pt. Currently getting up with cardiac rehab.  Will check back as able or see if another therapist can stop by.  Robet LeuMorris, Genice Kimberlin Lorraine, COTA/L 10/20/2014, 8:47 AM

## 2014-10-21 ENCOUNTER — Inpatient Hospital Stay (HOSPITAL_COMMUNITY): Payer: 59

## 2014-10-21 ENCOUNTER — Telehealth (HOSPITAL_COMMUNITY): Payer: Self-pay | Admitting: Dentistry

## 2014-10-21 DIAGNOSIS — I2510 Atherosclerotic heart disease of native coronary artery without angina pectoris: Secondary | ICD-10-CM

## 2014-10-21 LAB — URINALYSIS, ROUTINE W REFLEX MICROSCOPIC
Bilirubin Urine: NEGATIVE
Glucose, UA: NEGATIVE mg/dL
HGB URINE DIPSTICK: NEGATIVE
Ketones, ur: NEGATIVE mg/dL
Leukocytes, UA: NEGATIVE
Nitrite: NEGATIVE
Protein, ur: NEGATIVE mg/dL
SPECIFIC GRAVITY, URINE: 1.019 (ref 1.005–1.030)
Urobilinogen, UA: 2 mg/dL — ABNORMAL HIGH (ref 0.0–1.0)
pH: 5.5 (ref 5.0–8.0)

## 2014-10-21 LAB — CBC
HCT: 28 % — ABNORMAL LOW (ref 39.0–52.0)
HEMOGLOBIN: 9.1 g/dL — AB (ref 13.0–17.0)
MCH: 29.4 pg (ref 26.0–34.0)
MCHC: 32.5 g/dL (ref 30.0–36.0)
MCV: 90.3 fL (ref 78.0–100.0)
Platelets: 366 10*3/uL (ref 150–400)
RBC: 3.1 MIL/uL — ABNORMAL LOW (ref 4.22–5.81)
RDW: 14 % (ref 11.5–15.5)
WBC: 15.3 10*3/uL — ABNORMAL HIGH (ref 4.0–10.5)

## 2014-10-21 LAB — BLOOD GAS, ARTERIAL
ACID-BASE EXCESS: 1.3 mmol/L (ref 0.0–2.0)
Bicarbonate: 25.5 mEq/L — ABNORMAL HIGH (ref 20.0–24.0)
DRAWN BY: 36277
FIO2: 0.21 %
O2 Saturation: 91.2 %
PCO2 ART: 40.8 mmHg (ref 35.0–45.0)
Patient temperature: 98.6
TCO2: 26.7 mmol/L (ref 0–100)
pH, Arterial: 7.411 (ref 7.350–7.450)
pO2, Arterial: 65.6 mmHg — ABNORMAL LOW (ref 80.0–100.0)

## 2014-10-21 LAB — COMPREHENSIVE METABOLIC PANEL
ALT: 30 U/L (ref 0–53)
ANION GAP: 9 (ref 5–15)
AST: 25 U/L (ref 0–37)
Albumin: 2.7 g/dL — ABNORMAL LOW (ref 3.5–5.2)
Alkaline Phosphatase: 425 U/L — ABNORMAL HIGH (ref 39–117)
BUN: 35 mg/dL — ABNORMAL HIGH (ref 6–23)
CO2: 26 mmol/L (ref 19–32)
Calcium: 8.2 mg/dL — ABNORMAL LOW (ref 8.4–10.5)
Chloride: 98 mEq/L (ref 96–112)
Creatinine, Ser: 1.39 mg/dL — ABNORMAL HIGH (ref 0.50–1.35)
GFR, EST AFRICAN AMERICAN: 71 mL/min — AB (ref >90)
GFR, EST NON AFRICAN AMERICAN: 61 mL/min — AB (ref >90)
GLUCOSE: 164 mg/dL — AB (ref 70–99)
Potassium: 4.9 mmol/L (ref 3.5–5.1)
SODIUM: 133 mmol/L — AB (ref 135–145)
Total Bilirubin: 1.8 mg/dL — ABNORMAL HIGH (ref 0.3–1.2)
Total Protein: 6.3 g/dL (ref 6.0–8.3)

## 2014-10-21 LAB — GLUCOSE, CAPILLARY
GLUCOSE-CAPILLARY: 150 mg/dL — AB (ref 70–99)
GLUCOSE-CAPILLARY: 152 mg/dL — AB (ref 70–99)
Glucose-Capillary: 147 mg/dL — ABNORMAL HIGH (ref 70–99)
Glucose-Capillary: 181 mg/dL — ABNORMAL HIGH (ref 70–99)
Glucose-Capillary: 156 mg/dL — ABNORMAL HIGH (ref 70–99)

## 2014-10-21 LAB — BRAIN NATRIURETIC PEPTIDE: B Natriuretic Peptide: 172.3 pg/mL — ABNORMAL HIGH (ref 0.0–100.0)

## 2014-10-21 MED ORDER — LISINOPRIL 10 MG PO TABS
10.0000 mg | ORAL_TABLET | Freq: Every day | ORAL | Status: DC
  Filled 2014-10-21 (×4): qty 1

## 2014-10-21 MED ORDER — ENOXAPARIN SODIUM 80 MG/0.8ML ~~LOC~~ SOLN: 70.0000 mg | SUBCUTANEOUS | Status: DC

## 2014-10-21 MED ORDER — ALBUMIN HUMAN 5 % IV SOLN
12.5000 g | Freq: Once | INTRAVENOUS | Status: AC
  Administered 2014-10-21: 12.5 g via INTRAVENOUS
  Filled 2014-10-21: qty 250

## 2014-10-21 MED ORDER — INSULIN DETEMIR 100 UNIT/ML ~~LOC~~ SOLN
50.0000 [IU] | Freq: Every day | SUBCUTANEOUS | Status: DC
  Administered 2014-10-21 – 2014-10-26 (×5): 50 [IU] via SUBCUTANEOUS
  Filled 2014-10-21 (×6): qty 0.5

## 2014-10-21 MED ORDER — ENOXAPARIN SODIUM 30 MG/0.3ML ~~LOC~~ SOLN
30.0000 mg | Freq: Once | SUBCUTANEOUS | Status: AC
Start: 2014-10-21 — End: 2014-10-21
  Administered 2014-10-21: 30 mg via SUBCUTANEOUS
  Filled 2014-10-21: qty 0.3

## 2014-10-21 NOTE — Progress Notes (Addendum)
[address]       [place] [address]             [phone]      8 Days Post-Op Procedure(s) (LRB): CORONARY ARTERY BYPASS GRAFTING (CABG), ON PUMP, TIMES FOUR, USING LEFT INTERNAL MAMMARY ARTERY, BILATERAL GREATER SAPHENOUS VEINS HARVESTED ENDOSCOPICALLY (N/A) Subjective: Feels poorly, mobility remains problematic d/t deconditioning/weakness   Objective: Vital signs in last 24 hours: Temp:  [97.5 F (36.4 C)-99.3 F (37.4 C)] 97.5 F (36.4 C) (01/12 0603) Pulse Rate:  [83-107] 89 (01/12 0654) Cardiac Rhythm:  [-] Normal sinus rhythm;Sinus tachycardia (01/11 2000) Resp:  [18-22] 20 (01/12 0603) BP: (77-132)/(45-66) 108/57 mmHg (01/12 0654) SpO2:  [94 %-96 %] 96 % (01/12 0603) Weight:  [325 lb (147.419 kg)] 325 lb (147.419 kg) (01/12 0700)  Hemodynamic parameters for last 24 hours:    Intake/Output from previous day: 01/11 0701 - 01/12 0700 In: 720 [P.O.:720] Out: 1651 [Urine:1650; Stool:1] Intake/Output this shift:    General appearance: alert, cooperative and no distress Heart: regular rate and rhythm and occ extrasystole Lungs: dim in lower fields but difficult to examine Abdomen: soft, non-tender Extremities: mild edema Wound: incis healing well  Lab Results:  Recent Labs  10/21/14 0546  WBC 15.3*  HGB 9.1*  HCT 28.0*  PLT 366   BMET:  Recent Labs  10/20/14 0918 10/21/14 0546  NA 133* 133*  K 4.0 4.9  CL 96 98  CO2 26 26  GLUCOSE 212* 164*  BUN 27* 35*  CREATININE 1.27 1.39*  CALCIUM 8.5 8.2*    PT/INR: No results for input(s): LABPROT, INR in the last 72 hours. ABG    Component Value Date/Time   PHART 7.411 10/21/2014 0319   HCO3 25.5* 10/21/2014 0319   TCO2 26.7 10/21/2014 0319   ACIDBASEDEF 4.0* 10/14/2014 1018   O2SAT 91.2 10/21/2014 0319   CBG (last 3)   Recent Labs  10/20/14 2044 10/21/14 0303 10/21/14 0600  GLUCAP 221* 147* 150*    Meds Scheduled Meds: . amiodarone  400 mg Oral BID  .  antiseptic oral rinse  7 mL Mouth Rinse q12n4p  . aspirin EC  325 mg Oral Daily  . atorvastatin  80 mg Oral q1800  . bisacodyl  10 mg Oral Daily   Or  . bisacodyl  10 mg Rectal Daily  . chlorhexidine  15 mL Mouth Rinse BID  . dextromethorphan-guaiFENesin  2 tablet Oral BID  . docusate sodium  200 mg Oral Daily  . enoxaparin (LOVENOX) injection  40 mg Subcutaneous Q24H  . insulin aspart  0-20 Units Subcutaneous TID WC  . insulin aspart  0-5 Units Subcutaneous QHS  . insulin detemir  45 Units Subcutaneous Daily  . lisinopril  10 mg Oral Daily  . metFORMIN  500 mg Oral BID WC  . metoprolol tartrate  50 mg Oral BID  . pantoprazole  40 mg Oral Daily  . sodium chloride  10-40 mL Intracatheter Q12H  . sodium chloride  3 mL Intravenous Q12H  . sodium chloride  3 mL Intravenous Q12H   Continuous Infusions: . sodium chloride     PRN Meds:.sodium chloride, metoprolol, ondansetron (ZOFRAN) IV, oxyCODONE, phenol, sodium chloride, sodium chloride, sodium chloride, traMADol  Xrays Dg Chest 2 View  10/21/2014   CLINICAL DATA:  CABG.  EXAM: CHEST  2 VIEW  COMPARISON:  10/17/2014.  FINDINGS: Interim removal of right IJ line. Stable cardiomegaly. Prior CABG. Pulmonary vascularity is normal. Lungs  are clear. Interim clearing of subsegmental atelectasis left upper and lower lobes. No pleural effusion or pneumothorax. No acute bony abnormality.  IMPRESSION: 1. Interim removal right IJ line . 2. Interim clearing of left upper and left lower lobe subsegmental atelectasis. 3. Stable cardiomegaly.  Prior CABG.  No CHF.   Electronically Signed   By: Maisie Fushomas  Register   On: 10/21/2014 07:54    Assessment/Plan: S/P Procedure(s) (LRB): CORONARY ARTERY BYPASS GRAFTING (CABG), ON PUMP, TIMES FOUR, USING LEFT INTERNAL MAMMARY ARTERY, BILATERAL GREATER SAPHENOUS VEINS HARVESTED ENDOSCOPICALLY (N/A)  1 somewhat less frequent PVC's , lytes ok- cont current rx of amio and beta blocker 2 CXR appearance is improved 3  labs stable, H/H is improved, pBNP 172, lasix stopped- monitor Creat closely 4 Hold lisinopril for SBP < 120 5 leukocytosis - check UA 6 sugars some elevations at times- increase insulin 7 adjust lovenox per pharmacy for prophylaxis(DVT)  LOS: 11 days    GOLD,WAYNE E 10/21/2014  I have seen and examined the patient and agree with the assessment and plan as outlined.  Episode orthostatic hypotension overnight when patient got up to walk.  Improved quickly w/ IV hydration.  Lasix stopped.  WBC up slightly today.  No fevers, cough, nor other signs of infection, although patient reports "feeling hot".  Incision looks good.  CXR clear.  CBG's under reasonably good control. Will check UA and culture.  Blood culture x2.  Mobilize.  Jaiona Simien H 10/21/2014 8:58 AM

## 2014-10-21 NOTE — Progress Notes (Addendum)
Pt requested to walk with RN this AM.  RN helped pt out of bed and up with RW.  Pt ambulated to door when stating he felt dizzy.  RN sat pt down.  BP with pt sitting on RW reading 60s-80s/40s-50s, O2 96% on RA.  CBG WNL. As 2 RNs assisted pt back into room to get in bed pt became very shakey, pupils fixated, stating he felt dizzy, although pt was oriented and arousable throughout.  Pt remained in SR 80s.  RR RN called to bedside to assess.  Md on call notified.  Pt placed in bed.  New orders received from MD.  Will carry out orders and continue to monitor.

## 2014-10-21 NOTE — Progress Notes (Signed)
PRE-OPERATIVE NOTE:  10/21/2014 Mark SalkBruce A Leach [id number]  VITALS: BP 100/63 mmHg  Pulse 99  Temp(Src) 97.5 F (36.4 C) (Oral)  Resp 20  Ht 5\' 11"  (1.803 m)  Wt 325 lb (147.419 kg)  BMI 45.35 kg/m2  SpO2 96%  Lab Results  Component Value Date   WBC 15.3* 10/21/2014   HGB 9.1* 10/21/2014   HCT 28.0* 10/21/2014   MCV 90.3 10/21/2014   PLT 366 10/21/2014   BMET    Component Value Date/Time   NA 133* 10/21/2014 0546   K 4.9 10/21/2014 0546   CL 98 10/21/2014 0546   CO2 26 10/21/2014 0546   GLUCOSE 164* 10/21/2014 0546   BUN 35* 10/21/2014 0546   CREATININE 1.39* 10/21/2014 0546   CALCIUM 8.2* 10/21/2014 0546   GFRNONAA 61* 10/21/2014 0546   GFRAA 71* 10/21/2014 0546    Lab Results  Component Value Date   INR 1.40 10/13/2014   INR 1.05 10/12/2014   INR 1.01 10/11/2014   No results found for: PTT   [name] scheduled for extraction of remaining teeth with alveoloplasty and pre-prosthetic surgery as indicated in the operating room with general anesthesia tomorrow at 10:30 AM.  SUBJECTIVE: The patient denies any acute medical or dental changes and agrees to proceed with treatment as planned.  EXAM: No sign of acute dental changes.  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  PLAN: Patient agrees to proceed with treatment as planned in the operating room as previously discussed and accepts the risks, benefits, and complications of the proposed treatment. Patient is aware of the risk for bleeding, bruising, swelling, infection, pain, nerve damage, soft tissue damage, sinus involvement, root tip fracture, mandible fracture, and the risks of complications associated with the anesthesia. Patient also is aware of the potential for other complications not mentioned above.   Mark Leach, DDS

## 2014-10-21 NOTE — Progress Notes (Signed)
10/21/2014 2:11 PM Nursing note Pt. With many questions regarding dental procedure planned for tomorrow. Pt. Feels that he did not fully understand the plans. Dr. Gershon MusselKullinski's office paged and made aware of concerns. Will await callback. Pt. Requesting that MD contact his wife to explain procedures. Will share this information upon return of call. Pt. Not able to sign consent prior to being educated further. Will continue to monitor patient.  Sreya Froio, Blanchard KelchStephanie Ingold

## 2014-10-21 NOTE — Progress Notes (Addendum)
Lab called RN with critical Hemoglobin of 5.3.  MD on call paged per order.  After MD paged, Lab notified charge RN that the pts lab were contaminated and results were inaccurate, labs should be redrawn.  Lab results will not be entered in computer.  MD aware.  Will update MD if necessary.  Will continue to monitor.

## 2014-10-21 NOTE — Progress Notes (Signed)
CARDIAC REHAB PHASE I   PRE:  Rate/Rhythm: 109 ST    BP: sitting 120/87    SaO2: 96 2L, 95 RA  MODE:  Ambulation: 350 ft   POST:  Rate/Rhythm: 125 ST    BP: sitting 129/68     SaO2: 93 RA  Pt sweaty in recliner and c/o visual disturbances. Able to stand and walk with RW, without assist. Supervision and followed with chair. Rest x1. Return to straight back chair. Pt sts "I'm just so hot". VSS, HR lower today. Will f/u. [phone]  Elissa LovettReeve, Asia Favata Wise RiverKristan CES, ACSM 10/21/2014 2:15 PM

## 2014-10-21 NOTE — Telephone Encounter (Signed)
10/21/2014  Patient:            Hollie SalkBruce A Ripoll Date of Birth:  [birth year] MRN:                [id number]   I called the patient's wife at home to discuss the plan of care for the patient in the operating tomorrow at 10:30 AM. I discussed the risks, benefits, and complications of the dental treatment planned to include extraction of remaining teeth with alveoloplasty and pre-prosthetic surgery as indicated in the OR with general anesthesia. I discussed the potential complications of bleeding, bruising, swelling, infection, nerve damage, soft tissue damage, root tip fracture, mandible fracture, and other complications of the anesthesia.  I also indicated that there may be other complications not mentioned above. The patient's wife expressed understanding and all questions were answered.   Charlynne Panderonald F Kulinski, DDS

## 2014-10-21 NOTE — Progress Notes (Signed)
Subjective:  Feels hot and cold. Wash cloth over head. No significant SOB. Felt dizzy when standing with PT this AM.   Objective:  Vital Signs in the last 24 hours: Temp:  [97.5 F (36.4 C)-99.3 F (37.4 C)] 97.5 F (36.4 C) (01/12 0603) Pulse Rate:  [83-107] 89 (01/12 0654) Resp:  [18-22] 20 (01/12 0603) BP: (77-132)/(45-66) 117/59 mmHg (01/12 0730) SpO2:  [94 %-96 %] 96 % (01/12 0603) Weight:  [325 lb (147.419 kg)] 325 lb (147.419 kg) (01/12 0700)  Intake/Output from previous day: 01/11 0701 - 01/12 0700 In: 720 [P.O.:720] Out: 1651 [Urine:1650; Stool:1]   Physical Exam: General: Well developed, well nourished, in no acute distress. Head:  Normocephalic and atraumatic. Lungs: Clear to auscultation and percussion. Distant BS (obese) Heart: Normal S1 and S2. No Ectopy, No murmur, rubs or gallops. Scar healing.  Abdomen: soft, non-tender, positive bowel sounds. Obese Extremities: No clubbing or cyanosis. No edema. Neurologic: Alert and oriented x 3.    Lab Results:  Recent Labs  10/21/14 0546  WBC 15.3*  HGB 9.1*  PLT 366    Recent Labs  10/20/14 0918 10/21/14 0546  NA 133* 133*  K 4.0 4.9  CL 96 98  CO2 26 26  GLUCOSE 212* 164*  BUN 27* 35*  CREATININE 1.27 1.39*    Recent Labs  10/19/14 0515  TROPONINI 0.60*   Hepatic Function Panel  Recent Labs  10/21/14 0546  PROT 6.3  ALBUMIN 2.7*  AST 25  ALT 30  ALKPHOS 425*  BILITOT 1.8*    Imaging: Dg Chest 2 View  10/21/2014   CLINICAL DATA:  CABG.  EXAM: CHEST  2 VIEW  COMPARISON:  10/17/2014.  FINDINGS: Interim removal of right IJ line. Stable cardiomegaly. Prior CABG. Pulmonary vascularity is normal. Lungs are clear. Interim clearing of subsegmental atelectasis left upper and lower lobes. No pleural effusion or pneumothorax. No acute bony abnormality.  IMPRESSION: 1. Interim removal right IJ line . 2. Interim clearing of left upper and left lower lobe subsegmental atelectasis. 3. Stable  cardiomegaly.  Prior CABG.  No CHF.   Electronically Signed   By: Maisie Fushomas  Register   On: 10/21/2014 07:54   Personally viewed.   Telemetry: Freq PVC's, no afib Personally viewed.  Cardiac Studies:  EF pre op 25-30% ECG: Qtc 526ms, SR, PVC's  Scheduled Meds: . amiodarone  400 mg Oral BID  . antiseptic oral rinse  7 mL Mouth Rinse q12n4p  . aspirin EC  325 mg Oral Daily  . atorvastatin  80 mg Oral q1800  . bisacodyl  10 mg Oral Daily   Or  . bisacodyl  10 mg Rectal Daily  . chlorhexidine  15 mL Mouth Rinse BID  . dextromethorphan-guaiFENesin  2 tablet Oral BID  . docusate sodium  200 mg Oral Daily  . enoxaparin (LOVENOX) injection  30 mg Subcutaneous Once  . [START ON 10/22/2014] enoxaparin (LOVENOX) injection  70 mg Subcutaneous Q24H  . insulin aspart  0-20 Units Subcutaneous TID WC  . insulin aspart  0-5 Units Subcutaneous QHS  . insulin detemir  50 Units Subcutaneous Daily  . lisinopril  10 mg Oral Daily  . metFORMIN  500 mg Oral BID WC  . metoprolol tartrate  50 mg Oral BID  . pantoprazole  40 mg Oral Daily  . sodium chloride  10-40 mL Intracatheter Q12H  . sodium chloride  3 mL Intravenous Q12H  . sodium chloride  3 mL Intravenous Q12H   Continuous  Infusions: . sodium chloride     PRN Meds:.sodium chloride, metoprolol, ondansetron (ZOFRAN) IV, oxyCODONE, phenol, sodium chloride, sodium chloride, sodium chloride, traMADol  Assessment/Plan:   44 year old with CAD s/p CABG x 4 with ischemic cardiomyopathy EF 25%, PVC's, morbid obesity, AKI.  Ischemic cardiomyopathy  - metoprolol 50 BID  - lisinopril 10 on hold (creat increase)  - Orthostatic symptoms this AM   - lasix on hold (creat increase 1.05-->1.27-->1.39), gently hydrate, Agree  - BNP 172, net out 5 liters.  - deconditioning noted. PT  - If creat is stable, try to add back ACE-I tomorrow  - Will need lasix at some point   CAD  - post 4v CABG  - ASA  PVC  - expected with cardiomyopathy  - on Bb and  amiodarone (no afib) improved  Morbid obesity  - weight loss, rehab    [place] Current 10/21/2014, 9:03 AM

## 2014-10-21 NOTE — Progress Notes (Signed)
10/21/2014 10:45 AM Nursing note I & O cath performed per verbal order Dr. Cornelius Moraswen to obtain UA and culture. Pt. Tolerated well.  Raman Featherston, Blanchard KelchStephanie Ingold

## 2014-10-21 NOTE — Progress Notes (Signed)
10/21/2014 2:41 PM Nursing note Received call back from Dr. Verdie ShireKullinski. Md re-explained procedure over the phone to patient. Questions and concerns addressed. MD to also contact pt. Wife and update her on plan of care.  Jowanda Heeg, Blanchard KelchStephanie Ingold

## 2014-10-21 NOTE — Progress Notes (Signed)
ANTICOAGULATION CONSULT NOTE - Initial Consult  Pharmacy Consult for lovenox  Indication: VTE prophylaxis  Allergies  Allergen Reactions  . Nitroglycerin Other (See Comments)    Hypotension, syncope, bradycardia    Patient Measurements: Height: 5\' 11"  (180.3 cm) Weight: (!) 325 lb (147.419 kg) (try standing at another time ) IBW/kg (Calculated) : 75.3  Vital Signs: Temp: 97.5 F (36.4 C) (01/12 0603) Temp Source: Oral (01/12 0603) BP: 117/59 mmHg (01/12 0730) Pulse Rate: 89 (01/12 0654)  Labs:  Recent Labs  10/19/14 0515 10/20/14 0918 10/21/14 0546  HGB  --   --  9.1*  HCT  --   --  28.0*  PLT  --   --  366  CREATININE  --  1.27 1.39*  CKTOTAL 33  --   --   CKMB 1.0  --   --   TROPONINI 0.60*  --   --     Estimated Creatinine Clearance: 101.9 mL/min (by C-G formula based on Cr of 1.39).   Medical History: Past Medical History  Diagnosis Date  . Coronary artery disease 01/2013    anterior MI with cath showing 95% pLAD, 90% diag, 30% OM1, 70% mRCA, 60% dRCA s/p PCI with DES to LAD and diag and EF 40%  . Ischemic dilated cardiomyopathy   . Hypertension   . Morbid obesity   . Acute on chronic combined systolic and diastolic heart failure   . Visual field defect 10/12/2014  . Diabetes mellitus type 2, uncontrolled, with complications   . Stroke 10/12/2014    Patient with several month h/o left sided visual field deficit and MRI of brain revealing:  1. Medial right occipital lobe subacute infarct. 2. At least 3 punctate areas of acute nonhemorrhagic infarct are present involving the left thalamus, high posterior right frontal lobe and high a medial posterior left frontal or parietal lobe. 3. Additional white matter changes are noted beyond the areas of  . 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   Patient with elevated BMI of 46 (147kg), will adjust lovenox  dosing to provide adequate DVT px with 0.5mg /kg/day. Patient has already received 40mg  this morning, will add another 30mg  to equal 70mg  daily. Continue to follow cbc as needed.  Amiodarone Drug interaction evaluation -- There is potential for drug interactions to occur several weeks or months after stopping treatment and the onset of drug interactions may be slow after initiating amiodarone.   [x]  Statins: Increased risk of myopathy. Simvastatin- restrict dose to 20mg  daily. Other statins: counsel patients to report any muscle pain or weakness immediately. Patient currently on atorvastatin 80mg  - low likelyhood of interaction.  []  Anticoagulants: Amiodarone can increase anticoagulant effect. Consider warfarin dose reduction. Patients should be monitored closely and the dose of anticoagulant altered accordingly, remembering that amiodarone levels take several weeks to stabilize.  []  Antiepileptics: Amiodarone can increase plasma concentration of phenytoin, the dose should be reduced. Note that small changes in phenytoin dose can result in large changes in levels. Monitor patient and counsel on signs of toxicity.  [x]  Beta blockers: increased risk of bradycardia, AV block and myocardial depression. Sotalol - avoid concomitant use.  Patient currently on metoprolol with HR 80-100s, no current concerns.  []   Calcium channel blockers (diltiazem and verapamil): increased risk of bradycardia, AV block and myocardial depression.  []   Cyclosporine: Amiodarone increases levels of cyclosporine. Reduced dose of cyclosporine is recommended.   Digoxin dose should be halved when amiodarone is started.   Diuretics: increased risk of cardiotoxicity if hypokalemia occurs.   Oral hypoglycemic agents (glyburide, glipizide, glimepiride): increased risk of hypoglycemia. Patient's glucose levels should be monitored closely when initiating amiodarone therapy.    Drugs that prolong the QT interval:  Torsades  de pointes risk may be increased with concurrent use - avoid if possible.  Monitor QTc, also keep magnesium/potassium WNL if concurrent therapy can't be avoided. Marland Kitchen Antibiotics: e.g. fluoroquinolones, erythromycin. . Antiarrhythmics: e.g. quinidine, procainamide, disopyramide, sotalol. . Antipsychotics: e.g. phenothiazines, haloperidol.  . Lithium, tricyclic antidepressants, and methadone.  Goal of Therapy:  Monitor platelets by anticoagulation protocol: Yes   Plan:  Additional  of lovenox x1 this am to equal  daily Continue to evaluate for possible drug interactions on a daily basis  Sheppard Coil PharmD., BCPS Clinical Pharmacist Pager [phone] 10/21/2014 9:03 AM

## 2014-10-22 ENCOUNTER — Inpatient Hospital Stay (HOSPITAL_COMMUNITY): Payer: 59 | Admitting: Anesthesiology

## 2014-10-22 ENCOUNTER — Encounter (HOSPITAL_COMMUNITY)
Admission: EM | Disposition: A | Discharge status: Home / Self Care | Payer: Self-pay | Source: Home / Self Care | Attending: Thoracic Surgery (Cardiothoracic Vascular Surgery)

## 2014-10-22 ENCOUNTER — Encounter (HOSPITAL_COMMUNITY): Payer: Self-pay | Admitting: Anesthesiology

## 2014-10-22 DIAGNOSIS — F329 Major depressive disorder, single episode, unspecified: Secondary | ICD-10-CM

## 2014-10-22 DIAGNOSIS — K083 Retained dental root: Secondary | ICD-10-CM

## 2014-10-22 DIAGNOSIS — K011 Impacted teeth: Secondary | ICD-10-CM | POA: Diagnosis present

## 2014-10-22 DIAGNOSIS — K0530 Chronic periodontitis, unspecified: Secondary | ICD-10-CM | POA: Diagnosis present

## 2014-10-22 DIAGNOSIS — K029 Dental caries, unspecified: Secondary | ICD-10-CM | POA: Diagnosis present

## 2014-10-22 DIAGNOSIS — F4321 Adjustment disorder with depressed mood: Secondary | ICD-10-CM

## 2014-10-22 DIAGNOSIS — K045 Chronic apical periodontitis: Secondary | ICD-10-CM | POA: Diagnosis present

## 2014-10-22 HISTORY — PX: MULTIPLE EXTRACTIONS WITH ALVEOLOPLASTY: SHX5342

## 2014-10-22 LAB — GLUCOSE, CAPILLARY
GLUCOSE-CAPILLARY: 151 mg/dL — AB (ref 70–99)
GLUCOSE-CAPILLARY: 127 mg/dL — AB (ref 70–99)
GLUCOSE-CAPILLARY: 153 mg/dL — AB (ref 70–99)
Glucose-Capillary: 126 mg/dL — ABNORMAL HIGH (ref 70–99)
Glucose-Capillary: 104 mg/dL — ABNORMAL HIGH (ref 70–99)
Glucose-Capillary: 153 mg/dL — ABNORMAL HIGH (ref 70–99)

## 2014-10-22 LAB — CBC
HCT: 27.5 % — ABNORMAL LOW (ref 39.0–52.0)
Hemoglobin: 8.9 g/dL — ABNORMAL LOW (ref 13.0–17.0)
MCH: 28.6 pg (ref 26.0–34.0)
MCHC: 32.4 g/dL (ref 30.0–36.0)
MCV: 88.4 fL (ref 78.0–100.0)
Platelets: 423 10*3/uL — ABNORMAL HIGH (ref 150–400)
RBC: 3.11 MIL/uL — AB (ref 4.22–5.81)
RDW: 14 % (ref 11.5–15.5)
WBC: 14.9 10*3/uL — AB (ref 4.0–10.5)

## 2014-10-22 LAB — URINE CULTURE
Colony Count: NO GROWTH
Culture: NO GROWTH
Special Requests: NORMAL

## 2014-10-22 LAB — BASIC METABOLIC PANEL
Anion gap: 6 (ref 5–15)
BUN: 30 mg/dL — AB (ref 6–23)
CHLORIDE: 95 meq/L — AB (ref 96–112)
CO2: 29 mmol/L (ref 19–32)
Calcium: 8.2 mg/dL — ABNORMAL LOW (ref 8.4–10.5)
Creatinine, Ser: 1.24 mg/dL (ref 0.50–1.35)
GFR calc Af Amer: 81 mL/min — ABNORMAL LOW (ref >90)
GFR calc non Af Amer: 70 mL/min — ABNORMAL LOW (ref >90)
Glucose, Bld: 129 mg/dL — ABNORMAL HIGH (ref 70–99)
Potassium: 4.7 mmol/L (ref 3.5–5.1)
Sodium: 130 mmol/L — ABNORMAL LOW (ref 135–145)

## 2014-10-22 SURGERY — MULTIPLE EXTRACTION WITH ALVEOLOPLASTY
Anesthesia: General | Site: Mouth

## 2014-10-22 MED ORDER — ROCURONIUM BROMIDE 100 MG/10ML IV SOLN
INTRAVENOUS | Status: DC | PRN
  Administered 2014-10-22: 40 mg via INTRAVENOUS

## 2014-10-22 MED ORDER — MORPHINE SULFATE 2 MG/ML IJ SOLN
2.0000 mg | INTRAMUSCULAR | Status: DC | PRN
  Administered 2014-10-22: 4 mg via INTRAVENOUS

## 2014-10-22 MED ORDER — MEPERIDINE HCL 25 MG/ML IJ SOLN: 6.2500 mg | INTRAMUSCULAR | Status: DC | PRN

## 2014-10-22 MED ORDER — BUPIVACAINE-EPINEPHRINE 0.5% -1:200000 IJ SOLN
INTRAMUSCULAR | Status: DC | PRN
  Administered 2014-10-22: 3.6 mL

## 2014-10-22 MED ORDER — PROPOFOL 10 MG/ML IV BOLUS
INTRAVENOUS | Status: DC | PRN
  Administered 2014-10-22: 150 mg via INTRAVENOUS

## 2014-10-22 MED ORDER — LIDOCAINE HCL (CARDIAC) 20 MG/ML IV SOLN
INTRAVENOUS | Status: DC | PRN
  Administered 2014-10-22: 100 mg via INTRAVENOUS

## 2014-10-22 MED ORDER — CEFAZOLIN SODIUM 1-5 GM-% IV SOLN
1.0000 g | Freq: Three times a day (TID) | INTRAVENOUS | Status: AC
  Administered 2014-10-22 – 2014-10-23 (×4): 1 g via INTRAVENOUS
  Filled 2014-10-22 (×5): qty 50

## 2014-10-22 MED ORDER — LIDOCAINE HCL (CARDIAC) 20 MG/ML IV SOLN
INTRAVENOUS | Status: AC
  Filled 2014-10-22: qty 5

## 2014-10-22 MED ORDER — GLYCOPYRROLATE 0.2 MG/ML IJ SOLN
INTRAMUSCULAR | Status: AC
  Filled 2014-10-22: qty 2

## 2014-10-22 MED ORDER — LACTATED RINGERS IV SOLN: INTRAVENOUS | Status: DC

## 2014-10-22 MED ORDER — NEOSTIGMINE METHYLSULFATE 10 MG/10ML IV SOLN
INTRAVENOUS | Status: DC | PRN
  Administered 2014-10-22: 3 mg via INTRAVENOUS

## 2014-10-22 MED ORDER — FENTANYL CITRATE 0.05 MG/ML IJ SOLN
INTRAMUSCULAR | Status: AC
  Filled 2014-10-22: qty 2

## 2014-10-22 MED ORDER — LACTATED RINGERS IV SOLN
INTRAVENOUS | Status: DC
  Administered 2014-10-22: 11:00:00 via INTRAVENOUS

## 2014-10-22 MED ORDER — PROPOFOL 10 MG/ML IV BOLUS
INTRAVENOUS | Status: AC
  Filled 2014-10-22: qty 20

## 2014-10-22 MED ORDER — FENTANYL CITRATE 0.05 MG/ML IJ SOLN: 25.0000 ug | INTRAMUSCULAR | Status: DC | PRN

## 2014-10-22 MED ORDER — PHENYLEPHRINE 40 MCG/ML (10ML) SYRINGE FOR IV PUSH (FOR BLOOD PRESSURE SUPPORT)
PREFILLED_SYRINGE | INTRAVENOUS | Status: AC
  Filled 2014-10-22: qty 10

## 2014-10-22 MED ORDER — LIDOCAINE-EPINEPHRINE 2 %-1:100000 IJ SOLN
INTRAMUSCULAR | Status: AC
  Filled 2014-10-22: qty 10.2

## 2014-10-22 MED ORDER — ONDANSETRON HCL 4 MG/2ML IJ SOLN
INTRAMUSCULAR | Status: DC | PRN
  Administered 2014-10-22: 4 mg via INTRAVENOUS

## 2014-10-22 MED ORDER — HEMOSTATIC AGENTS (NO CHARGE) OPTIME
TOPICAL | Status: DC | PRN
  Administered 2014-10-22: 1 via TOPICAL

## 2014-10-22 MED ORDER — PHENYLEPHRINE HCL 10 MG/ML IJ SOLN
INTRAMUSCULAR | Status: DC | PRN
  Administered 2014-10-22: 120 ug via INTRAVENOUS
  Administered 2014-10-22: 80 ug via INTRAVENOUS

## 2014-10-22 MED ORDER — NEOSTIGMINE METHYLSULFATE 10 MG/10ML IV SOLN
INTRAVENOUS | Status: AC
  Filled 2014-10-22: qty 1

## 2014-10-22 MED ORDER — 0.9 % SODIUM CHLORIDE (POUR BTL) OPTIME
TOPICAL | Status: DC | PRN
  Administered 2014-10-22: 1000 mL

## 2014-10-22 MED ORDER — ONDANSETRON HCL 4 MG/2ML IJ SOLN
INTRAMUSCULAR | Status: AC
  Filled 2014-10-22: qty 2

## 2014-10-22 MED ORDER — FENTANYL CITRATE 0.05 MG/ML IJ SOLN
INTRAMUSCULAR | Status: AC
  Filled 2014-10-22: qty 5

## 2014-10-22 MED ORDER — AMIODARONE HCL 200 MG PO TABS
200.0000 mg | ORAL_TABLET | Freq: Every day | ORAL | Status: DC
  Administered 2014-10-23 – 2014-10-26 (×4): 200 mg via ORAL
  Filled 2014-10-22 (×4): qty 1

## 2014-10-22 MED ORDER — FENTANYL CITRATE 0.05 MG/ML IJ SOLN
25.0000 ug | INTRAMUSCULAR | Status: DC | PRN
  Administered 2014-10-22: 50 ug via INTRAVENOUS

## 2014-10-22 MED ORDER — PROMETHAZINE HCL 25 MG/ML IJ SOLN: 6.2500 mg | INTRAMUSCULAR | Status: DC | PRN

## 2014-10-22 MED ORDER — DEXTROSE 5 % IV SOLN
3.0000 g | INTRAVENOUS | Status: AC
  Administered 2014-10-22: 3 g via INTRAVENOUS
  Filled 2014-10-22: qty 3000

## 2014-10-22 MED ORDER — LIDOCAINE-EPINEPHRINE 2 %-1:100000 IJ SOLN
INTRAMUSCULAR | Status: DC | PRN
  Administered 2014-10-22: 8.5 mL

## 2014-10-22 MED ORDER — BUPIVACAINE-EPINEPHRINE (PF) 0.5% -1:200000 IJ SOLN
INTRAMUSCULAR | Status: AC
  Filled 2014-10-22: qty 3.6

## 2014-10-22 MED ORDER — FENTANYL CITRATE 0.05 MG/ML IJ SOLN
INTRAMUSCULAR | Status: DC | PRN
  Administered 2014-10-22: 50 ug via INTRAVENOUS
  Administered 2014-10-22: 150 ug via INTRAVENOUS

## 2014-10-22 MED ORDER — AMINOCAPROIC ACID SOLUTION 5% (50 MG/ML)
100.0000 mL | ORAL | Status: AC
  Administered 2014-10-22 (×5): 100 mL via ORAL
  Filled 2014-10-22: qty 100

## 2014-10-22 MED ORDER — CITALOPRAM HYDROBROMIDE 20 MG PO TABS: 20.0000 mg | ORAL_TABLET | Freq: Every day | ORAL | Status: DC

## 2014-10-22 MED ORDER — OXYMETAZOLINE HCL 0.05 % NA SOLN
NASAL | Status: AC
  Filled 2014-10-22: qty 15

## 2014-10-22 MED ORDER — ROCURONIUM BROMIDE 50 MG/5ML IV SOLN
INTRAVENOUS | Status: AC
  Filled 2014-10-22: qty 1

## 2014-10-22 MED ORDER — CITALOPRAM HYDROBROMIDE 20 MG PO TABS
20.0000 mg | ORAL_TABLET | Freq: Every day | ORAL | Status: DC
  Administered 2014-10-22 – 2014-10-23 (×2): 20 mg via ORAL
  Filled 2014-10-22 (×3): qty 1

## 2014-10-22 MED ORDER — MIDAZOLAM HCL 2 MG/2ML IJ SOLN
INTRAMUSCULAR | Status: AC
  Filled 2014-10-22: qty 2

## 2014-10-22 MED ORDER — GLYCOPYRROLATE 0.2 MG/ML IJ SOLN
INTRAMUSCULAR | Status: DC | PRN
  Administered 2014-10-22: 0.4 mg via INTRAVENOUS

## 2014-10-22 MED ORDER — LACTATED RINGERS IV SOLN
INTRAVENOUS | Status: DC | PRN
  Administered 2014-10-22: 10:00:00 via INTRAVENOUS

## 2014-10-22 MED ORDER — PHENYLEPHRINE HCL 10 MG/ML IJ SOLN
10.0000 mg | INTRAMUSCULAR | Status: DC | PRN
  Administered 2014-10-22: 25 ug/min via INTRAVENOUS

## 2014-10-22 SURGICAL SUPPLY — 33 items
ALCOHOL 70% 16 OZ (MISCELLANEOUS) ×3 IMPLANT
ATTRACTOMAT 16X20 MAGNETIC DRP (DRAPES) ×3 IMPLANT
BLADE SURG 15 STRL LF DISP TIS (BLADE) ×2 IMPLANT
BLADE SURG 15 STRL SS (BLADE) ×4
COVER SURGICAL LIGHT HANDLE (MISCELLANEOUS) ×3 IMPLANT
GAUZE PACKING FOLDED 2  STR (GAUZE/BANDAGES/DRESSINGS) ×2
GAUZE PACKING FOLDED 2 STR (GAUZE/BANDAGES/DRESSINGS) ×1 IMPLANT
GAUZE SPONGE 4X4 16PLY XRAY LF (GAUZE/BANDAGES/DRESSINGS) ×3 IMPLANT
GLOVE BIOGEL PI IND STRL 6 (GLOVE) ×1 IMPLANT
GLOVE BIOGEL PI INDICATOR 6 (GLOVE) ×2
GLOVE SURG ORTHO 8.0 STRL STRW (GLOVE) ×3 IMPLANT
GLOVE SURG SS PI 6.0 STRL IVOR (GLOVE) ×3 IMPLANT
GOWN STRL REUS W/ TWL LRG LVL3 (GOWN DISPOSABLE) ×1 IMPLANT
GOWN STRL REUS W/TWL 2XL LVL3 (GOWN DISPOSABLE) ×3 IMPLANT
GOWN STRL REUS W/TWL LRG LVL3 (GOWN DISPOSABLE) ×2
HEMOSTAT SURGICEL 2X14 (HEMOSTASIS) ×3 IMPLANT
KIT BASIN OR (CUSTOM PROCEDURE TRAY) ×3 IMPLANT
KIT ROOM TURNOVER OR (KITS) ×3 IMPLANT
MANIFOLD NEPTUNE WASTE (CANNULA) ×3 IMPLANT
NEEDLE BLUNT 16X1.5 OR ONLY (NEEDLE) ×3 IMPLANT
NS IRRIG 1000ML POUR BTL (IV SOLUTION) ×3 IMPLANT
PACK EENT II TURBAN DRAPE (CUSTOM PROCEDURE TRAY) ×3 IMPLANT
PAD ARMBOARD 7.5X6 YLW CONV (MISCELLANEOUS) ×3 IMPLANT
SPONGE SURGIFOAM ABS GEL 100 (HEMOSTASIS) ×3 IMPLANT
SPONGE SURGIFOAM ABS GEL 12-7 (HEMOSTASIS) IMPLANT
SPONGE SURGIFOAM ABS GEL SZ50 (HEMOSTASIS) IMPLANT
SUCTION FRAZIER TIP 10 FR DISP (SUCTIONS) ×3 IMPLANT
SUT CHROMIC 3 0 PS 2 (SUTURE) ×12 IMPLANT
SYR 50ML SLIP (SYRINGE) ×3 IMPLANT
TOWEL OR 17X26 10 PK STRL BLUE (TOWEL DISPOSABLE) ×3 IMPLANT
TUBE CONNECTING 12'X1/4 (SUCTIONS) ×1
TUBE CONNECTING 12X1/4 (SUCTIONS) ×2 IMPLANT
YANKAUER SUCT BULB TIP NO VENT (SUCTIONS) ×3 IMPLANT

## 2014-10-22 NOTE — Anesthesia Procedure Notes (Signed)
Procedure Name: Intubation Date/Time: 10/22/2014 11:31 AM Performed by: Quentin OreWALKER, Brelyn Woehl E Pre-anesthesia Checklist: Patient identified, Emergency Drugs available, Suction available, Patient being monitored and Timeout performed Patient Re-evaluated:Patient Re-evaluated prior to inductionOxygen Delivery Method: Circle system utilized Preoxygenation: Pre-oxygenation with 100% oxygen Intubation Type: IV induction Ventilation: Mask ventilation without difficulty Tube type: Oral Tube size: 7.5 mm Number of attempts: 1 Airway Equipment and Method: Video-laryngoscopy Placement Confirmation: ETT inserted through vocal cords under direct vision,  positive ETCO2 and breath sounds checked- equal and bilateral Secured at: 22 cm Tube secured with: Tape Dental Injury: Teeth and Oropharynx as per pre-operative assessment  Comments: Electively used Glidescope d/t loose teeth. Grade I view on screen. AOI. +ETCO2 & BBS=.

## 2014-10-22 NOTE — Progress Notes (Signed)
TCTS BRIEF PROGRESS NOTE  Day of Surgery  S/P Procedure(s) (LRB): Extraction of tooth #'s 1,3,4,5,6,7,8,9,10,11,12,13,14,15,16,20,21,22,24, 25,26,27,28,29,30,32 with alveoloplasty. (N/A)   Stable after return from dental extraction.  Adequate pain control.  Doesn't appear to be bleeding.  Plan: Continue present plan.  Mark Leach H 10/22/2014 5:16 PM

## 2014-10-22 NOTE — Progress Notes (Signed)
PRE-OPERATIVE NOTE:  10/22/2014 Mark Leach [id number]  VITALS: BP 110/58 mmHg  Pulse 90  Temp(Src) 98.2 F (36.8 C) (Oral)  Resp 21  Ht 5\' 11"  (1.803 m)  Wt 322 lb (146.058 kg)  BMI 44.93 kg/m2  SpO2 94%  Lab Results  Component Value Date   WBC 14.9* 10/22/2014   HGB 8.9* 10/22/2014   HCT 27.5* 10/22/2014   MCV 88.4 10/22/2014   PLT 423* 10/22/2014   BMET    Component Value Date/Time   NA 130* 10/22/2014 0316   K 4.7 10/22/2014 0316   CL 95* 10/22/2014 0316   CO2 29 10/22/2014 0316   GLUCOSE 129* 10/22/2014 0316   BUN 30* 10/22/2014 0316   CREATININE 1.24 10/22/2014 0316   CALCIUM 8.2* 10/22/2014 0316   GFRNONAA 70* 10/22/2014 0316   GFRAA 81* 10/22/2014 0316    Lab Results  Component Value Date   INR 1.40 10/13/2014   INR 1.05 10/12/2014   INR 1.01 10/11/2014   No results found for: PTT   Mark Leach is planned for extraction of remaining teeth with alveoloplasty and pre-prosthetic surgery as indicated in the operating room with general anesthesia.   SUBJECTIVE: The patient denies any acute medical or dental changes and agrees to proceed with treatment as planned.  EXAM: No sign of acute dental changes.  ASSESSMENT: Patient is affected by chronic apical periodontitis, chronic periodontitis, multiple retained root segments, dental caries, significant tooth mobility, and malocclusion.  PLAN: Patient agrees to proceed with treatment as planned in the operating room as previously discussed and accepts the risks, benefits, and complications of the proposed treatment. Patient is aware of the risk for bleeding, bruising, swelling, infection, pain, nerve damage, soft tissue damage, sinus involvement, root tip fracture, mandible fracture, and the risks of complications associated with the anesthesia. Patient also is aware of the potential for other complications not mentioned above.   Charlynne Panderonald F. Kahlel Peake, DDS

## 2014-10-22 NOTE — Progress Notes (Signed)
Subjective:  Going for dental work. Depressed. No further dizziness.   Objective:  Vital Signs in the last 24 hours: Temp:  [98.2 F (36.8 C)-98.7 F (37.1 C)] 98.2 F (36.8 C) (01/13 0536) Pulse Rate:  [90-116] 90 (01/13 0536) Resp:  [20-21] 21 (01/13 0536) BP: (100-127)/(54-65) 110/58 mmHg (01/13 0536) SpO2:  [94 %-97 %] 94 % (01/13 0536) Weight:  [322 lb (146.058 kg)] 322 lb (146.058 kg) (01/13 0500)  Intake/Output from previous day: 01/12 0701 - 01/13 0700 In: 480 [P.O.:480] Out: 1950 [Urine:1950]   Physical Exam: General: Well developed, well nourished, in no acute distress. Head:  Normocephalic and atraumatic. Lungs: Clear to auscultation and percussion. Distant BS (obese) Heart: Normal S1 and S2. No Ectopy, No murmur, rubs or gallops. Scar healing.  Abdomen: soft, non-tender, positive bowel sounds. Obese Extremities: No clubbing or cyanosis. No edema. Neurologic: Alert and oriented x 3.    Lab Results:  Recent Labs  10/21/14 0546 10/22/14 0316  WBC 15.3* 14.9*  HGB 9.1* 8.9*  PLT 366 423*    Recent Labs  10/21/14 0546 10/22/14 0316  NA 133* 130*  K 4.9 4.7  CL 98 95*  CO2 26 29  GLUCOSE 164* 129*  BUN 35* 30*  CREATININE 1.39* 1.24   No results for input(s): TROPONINI in the last 72 hours.  Invalid input(s): CK, MB Hepatic Function Panel  Recent Labs  10/21/14 0546  PROT 6.3  ALBUMIN 2.7*  AST 25  ALT 30  ALKPHOS 425*  BILITOT 1.8*    Imaging: Dg Chest 2 View  10/21/2014   CLINICAL DATA:  CABG.  EXAM: CHEST  2 VIEW  COMPARISON:  10/17/2014.  FINDINGS: Interim removal of right IJ line. Stable cardiomegaly. Prior CABG. Pulmonary vascularity is normal. Lungs are clear. Interim clearing of subsegmental atelectasis left upper and lower lobes. No pleural effusion or pneumothorax. No acute bony abnormality.  IMPRESSION: 1. Interim removal right IJ line . 2. Interim clearing of left upper and left lower lobe subsegmental atelectasis. 3.  Stable cardiomegaly.  Prior CABG.  No CHF.   Electronically Signed   By: Maisie Fushomas  Register   On: 10/21/2014 07:54   Personally viewed.   Telemetry: Freq PVC's, no afib Personally viewed.  Cardiac Studies:  EF pre op 25-30% ECG: Qtc 526ms, SR, PVC's  Scheduled Meds: . amiodarone  400 mg Oral BID  . antiseptic oral rinse  7 mL Mouth Rinse q12n4p  . aspirin EC  325 mg Oral Daily  . atorvastatin  80 mg Oral q1800  . bisacodyl  10 mg Oral Daily   Or  . bisacodyl  10 mg Rectal Daily  . chlorhexidine  15 mL Mouth Rinse BID  . dextromethorphan-guaiFENesin  2 tablet Oral BID  . docusate sodium  200 mg Oral Daily  . insulin aspart  0-20 Units Subcutaneous TID WC  . insulin aspart  0-5 Units Subcutaneous QHS  . insulin detemir  50 Units Subcutaneous Daily  . lisinopril  10 mg Oral Daily  . metFORMIN  500 mg Oral BID WC  . metoprolol tartrate  50 mg Oral BID  . pantoprazole  40 mg Oral Daily  . sodium chloride  10-40 mL Intracatheter Q12H  . sodium chloride  3 mL Intravenous Q12H  . sodium chloride  3 mL Intravenous Q12H   Continuous Infusions: . sodium chloride     PRN Meds:.sodium chloride, metoprolol, ondansetron (ZOFRAN) IV, oxyCODONE, phenol, sodium chloride, sodium chloride, sodium chloride, traMADol  Assessment/Plan:  44 year old with CAD s/p CABG x 4 with ischemic cardiomyopathy EF 25%, PVC's, morbid obesity, AKI, depression.  Ischemic cardiomyopathy  - metoprolol 50 BID  - lisinopril 10 on hold (creat increase)  - Orthostatic symptoms this AM   - lasix on hold (creat improved 1.05-->1.27-->1.39-->1.24), gently hydrate, Agree  - BNP 172, net out 6 liters.  - deconditioning noted. PT  - Creat is stable,added back ACE-I   - Will need lasix at some point   CAD  - post 4v CABG  - ASA  PVC  - expected with cardiomyopathy  - on Bb and amiodarone (no afib) improved  Morbid obesity  - weight loss, rehab  Depression  - Mother states that she has never seen him like  this.  - Will call psychiatry consult.   Mark Leach 10/22/2014, 9:03 AM

## 2014-10-22 NOTE — Anesthesia Postprocedure Evaluation (Signed)
  Anesthesia Post-op Note  Patient: Mark SalkBruce A Yeater  Procedure(s) Performed: Procedure(s): Extraction of tooth #'s 1,3,4,5,6,7,8,9,10,11,12,13,14,15,16,20,21,22,24, 25,26,27,28,29,30,32 with alveoloplasty. (N/A)  Patient Location: PACU  Anesthesia Type:General  Level of Consciousness: awake  Airway and Oxygen Therapy: Patient Spontanous Breathing and Patient connected to nasal cannula oxygen  Post-op Pain: mild  Post-op Assessment: Post-op Vital signs reviewed and Patient's Cardiovascular Status Stable  Post-op Vital Signs: Reviewed and stable  Last Vitals:  Filed Vitals:   10/22/14 0958  BP: 132/69  Pulse: 98  Temp: 37.1 C  Resp: 18    Complications: No apparent anesthesia complications

## 2014-10-22 NOTE — Progress Notes (Signed)
Physical Therapy Note  Patient currently having teeth extracted in OR. Will follow up as time allows when pt is medically ready.  [address], [place] [phone]

## 2014-10-22 NOTE — Progress Notes (Signed)
Upon assessment of pt, pt visibly anxious and upset.  Pt stating "I don't know why I'm here", "I'm part of a big act, a conspiracy".  Pt VS stable- BP 108/48, HR 89 sinus rhythm, 94% on RA, CBG 151.  Although patient stating he doesn't know why he is here, he is alert and oriented x4, can tell RN why he is here and what type of surgery he had.  Pt also aware that he is having dental extractions tomorrow.  Neuro check WNL. Pt denies auditory or visual hallucinations.  Pt also denies suicidal/homocidal thoughts at this time.  Pt continued to state "I shouldn't be here", "Why am I going crazy", "I am not comprehending".  Pt also stating "How do I know this is real",  "how do i know I really had this surgery".  Emotional support and reassurance given to patient for over 45 minutes.  Pt also complaining of soreness at chest incision, PRN medication administered.  Pt continues to repeat "something is not right".  Pt reassured and emotional continues to be given to patient.  Will report to day RN for MD to address.  Pt resting in bed with call bell in reach.  Will continue to monitor patient closely.     0245: pt asleep, resting in bed with call bell in reach.  Will continue to monitor closely.

## 2014-10-22 NOTE — Transfer of Care (Signed)
Immediate Anesthesia Transfer of Care Note  Patient: Mark Leach  Procedure(s) Performed: Procedure(s): Extraction of tooth #'s 1,3,4,5,6,7,8,9,10,11,12,13,14,15,16,20,21,22,24, 25,26,27,28,29,30,32 with alveoloplasty. (N/A)  Patient Location: PACU  Anesthesia Type:General  Level of Consciousness: awake, alert  and oriented  Airway & Oxygen Therapy: Patient Spontanous Breathing and Patient connected to face mask oxygen  Post-op Assessment: Report given to PACU RN, Post -op Vital signs reviewed and stable and Patient moving all extremities X 4  Post vital signs: Reviewed and stable  Complications: No apparent anesthesia complications

## 2014-10-22 NOTE — Addendum Note (Signed)
Addendum  created 10/22/14 1506 by Quentin OreLuz E Kaiya Boatman, CRNA   Modules edited: Anesthesia Events, Narrator   Narrator:  Narrator: Event Log Edited

## 2014-10-22 NOTE — Progress Notes (Signed)
[address]       [place] [id number]             [phone]      9 Days Post-Op Procedure(s) (LRB): CORONARY ARTERY BYPASS GRAFTING (CABG), ON PUMP, TIMES FOUR, USING LEFT INTERNAL MAMMARY ARTERY, BILATERAL GREATER SAPHENOUS VEINS HARVESTED ENDOSCOPICALLY (N/A) Subjective: Feels poorly today but not in pain.  Eating well.  Ambulation limited by mood, weakness, and deconditioning.     Objective: Vital signs in last 24 hours: Temp:  [98.2 F (36.8 C)-98.7 F (37.1 C)] 98.2 F (36.8 C) (01/13 0536) Pulse Rate:  [90-116] 90 (01/13 0536) Cardiac Rhythm:  [-] Normal sinus rhythm;Sinus tachycardia (01/12 1944) Resp:  [20-21] 21 (01/13 0536) BP: (100-127)/(54-65) 110/58 mmHg (01/13 0536) SpO2:  [94 %-97 %] 94 % (01/13 0536) Weight:  [322 lb (146.058 kg)] 322 lb (146.058 kg) (01/13 0500)  Hemodynamic parameters for last 24 hours:    Intake/Output from previous day: 01/12 0701 - 01/13 0700 In: 480 [P.O.:480] Out: 1950 [Urine:1950] Intake/Output this shift:   General appearance: alert, cooperative in no distress Heart: Regular rate and rhythm without apparent murmur  Lungs: Diffuse mild wheezing b/l upper fields, dim lower fields, difficult to examine Abdomen: Soft, non-tender Extremities: mild edema Wound: incisions well healing   Lab Results:  Recent Labs  10/21/14 0546 10/22/14 0316  WBC 15.3* 14.9*  HGB 9.1* 8.9*  HCT 28.0* 27.5*  PLT 366 423*   BMET:   Recent Labs  10/21/14 0546 10/22/14 0316  NA 133* 130*  K 4.9 4.7  CL 98 95*  CO2 26 29  GLUCOSE 164* 129*  BUN 35* 30*  CREATININE 1.39* 1.24  CALCIUM 8.2* 8.2*    PT/INR: No results for input(s): LABPROT, INR in the last 72 hours. ABG    Component Value Date/Time   PHART 7.411 10/21/2014 0319   HCO3 25.5* 10/21/2014 0319   TCO2 26.7 10/21/2014 0319   ACIDBASEDEF 4.0* 10/14/2014 1018   O2SAT 91.2 10/21/2014 0319   CBG (last 3)   Recent Labs  10/21/14 2111  10/22/14 0141 10/22/14 0637  GLUCAP 156* 151* 126*    Meds Scheduled Meds: . amiodarone  400 mg Oral BID  . antiseptic oral rinse  7 mL Mouth Rinse q12n4p  . aspirin EC  325 mg Oral Daily  . atorvastatin  80 mg Oral q1800  . bisacodyl  10 mg Oral Daily   Or  . bisacodyl  10 mg Rectal Daily  . chlorhexidine  15 mL Mouth Rinse BID  . dextromethorphan-guaiFENesin  2 tablet Oral BID  . docusate sodium  200 mg Oral Daily  . insulin aspart  0-20 Units Subcutaneous TID WC  . insulin aspart  0-5 Units Subcutaneous QHS  . insulin detemir  50 Units Subcutaneous Daily  . lisinopril  10 mg Oral Daily  . metFORMIN  500 mg Oral BID WC  . metoprolol tartrate  50 mg Oral BID  . pantoprazole  40 mg Oral Daily  . sodium chloride  10-40 mL Intracatheter Q12H  . sodium chloride  3 mL Intravenous Q12H  . sodium chloride  3 mL Intravenous Q12H   Continuous Infusions: . sodium chloride     PRN Meds:.sodium chloride, metoprolol, ondansetron (ZOFRAN) IV, oxyCODONE, phenol, sodium chloride, sodium chloride, sodium chloride, traMADol  Xrays Dg Chest 2 View  10/21/2014   CLINICAL DATA:  CABG.  EXAM: CHEST  2 VIEW  COMPARISON:  10/17/2014.  FINDINGS: Interim  removal of right IJ line. Stable cardiomegaly. Prior CABG. Pulmonary vascularity is normal. Lungs are clear. Interim clearing of subsegmental atelectasis left upper and lower lobes. No pleural effusion or pneumothorax. No acute bony abnormality.  IMPRESSION: 1. Interim removal right IJ line . 2. Interim clearing of left upper and left lower lobe subsegmental atelectasis. 3. Stable cardiomegaly.  Prior CABG.  No CHF.   Electronically Signed   By: Maisie Fus  Register   On: 10/21/2014 07:54    Assessment/Plan: S/P Procedure(s) (LRB): CORONARY ARTERY BYPASS GRAFTING (CABG), ON PUMP, TIMES FOUR, USING LEFT INTERNAL MAMMARY ARTERY, BILATERAL GREATER SAPHENOUS VEINS HARVESTED ENDOSCOPICALLY (N/A)  1 NSR, QT high nml - cont to follow.  lytes ok- cont current  rx of amio and beta blocker 2 CXR improving atelectasis 3 labs stable, H/H is improved, pBNP 172, lasix stopped- monitor Creat closely 4 Hold lisinopril for SBP < 120 5 leukocytosis - UA neg for infxn, Blood cx pending 6 sugars improved - cont insulin 7 adjust lovenox per pharmacy for prophylaxis(DVT)  LOS: 12 days    Ollen Gross 10/22/2014  I have seen and examined the patient and agree with the assessment and plan as outlined.  Slow progress.  No clear evidence of infection nor other significant problems.  Seems depressed.  For dental extraction today.    Macrina Lehnert H 10/22/2014 8:35am

## 2014-10-22 NOTE — Consult Note (Signed)
[facility] Face-to-Face Psychiatry Consult   Reason for Consult:  Depression and status post cardiac surgery Referring Physician:  [name] is an 44 y.o. male. Total Time spent with patient: 45 minutes  Assessment: AXIS I:  Adjustment Disorder with Depressed Mood AXIS II:  Deferred AXIS III:   Past Medical History  Diagnosis Date  . Coronary artery disease 01/2013    anterior MI with cath showing 95% pLAD, 90% diag, 30% OM1, 70% mRCA, 60% dRCA s/p PCI with DES to LAD and diag and EF 40%  . Ischemic dilated cardiomyopathy   . Hypertension   . Morbid obesity   . Acute on chronic combined systolic and diastolic heart failure   . Visual field defect 10/12/2014  . Diabetes mellitus type 2, uncontrolled, with complications   . Stroke 10/12/2014    Patient with several month h/o left sided visual field deficit and MRI of brain revealing:  1. Medial right occipital lobe subacute infarct. 2. At least 3 punctate areas of acute nonhemorrhagic infarct are present involving the left thalamus, high posterior right frontal lobe and high a medial posterior left frontal or parietal lobe. 3. Additional white matter changes are noted beyond the areas of  . 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   Supportive therapy provided about ongoing stressors.  Appreciate psychiatric consultation and follow up as clinically required Please contact [phone] or [phone] if needs further assistance   Subjective:   Mark Leach is a 44 y.o. male patient admitted with Depression and status post cardiac surgery .  HPI:  Mark Leach is a 44 years old white male seen, chart reviewed and  case discussed with him and his family including aunt, mother and his wife who are at bed side. Patient has been depressed with sadness, tearful, low energy, anxious about side effects of surgery and states that he does no know why he is still in hospital instead of going home etc. He has disturbed sleep but fair appetite. He has denied current suicide or homicide ideation, intention or plans. He has no previous history of mental illness. 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 Patient family stated fluoxetine does not work in his family and agree with starting citalopram after brief discussion of risks and benefits.   Medical history: Patient with morbidly obese WM with a history of AWMI 4/14 with cath a [facility] showing 95% prox LAD, 90% D1, 30% OM1, 70% mid RCA, 60% distal RCA and EF 40%. He has not seen a Cardiologist since then. He has a history of Type II DM as well. He is not on a statin. He was in his USOH until 3 days ago when he started having severe chest burning but it would only occur at night and mainly when lying down and he would get very SOB. He also was having some problems with cough. He denies any fever but had some subjective chills. He tried some alka seltzer with minimal relief. He says that he is unable to lay down to go to sleep  due to the burning sensation. He just finished antibiotics for a sinus infection. He presented to [facility] ER today after finishing dinner when he had reoccurence of the chest discomfort. He has a history of GERD. He says that his symptoms are different from what he can recall from his prior MI. In ER he was noted to have an old anterior MI but new ST elevation in V1 and V2 more pronounced than the minimal ST elevation he had on prior EKG. A code STEMI was called and he now presents to [facility] cath lab with 3/10 chest discomfort. HPI Elements:   Location:  depression. Quality:  sad, tearful, anxious and  worried. Severity:  low energy, isolated and withdrawn. Timing:  status post cardiac surgery. Duration:  one week. Context:  adjustment with depression secondary to hospitalization..  Past Psychiatric History: Past Medical History  Diagnosis Date  . Coronary artery disease 01/2013    anterior MI with cath showing 95% pLAD, 90% diag, 30% OM1, 70% mRCA, 60% dRCA s/p PCI with DES to LAD and diag and EF 40%  . Ischemic dilated cardiomyopathy   . Hypertension   . Morbid obesity   . Acute on chronic combined systolic and diastolic heart failure   . Visual field defect 10/12/2014  . Diabetes mellitus type 2, uncontrolled, with complications   . Stroke 10/12/2014    Patient with several month h/o left sided visual field deficit and MRI of brain revealing:  1. Medial right occipital lobe subacute infarct. 2. At least 3 punctate areas of acute nonhemorrhagic infarct are present involving the left thalamus, high posterior right frontal lobe and high a medial posterior left frontal or parietal lobe. 3. Additional white matter changes are noted beyond the areas of  . 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 He does not have any smokeless tobacco history on file. He reports that he does not drink alcohol or use illicit drugs. Family History  Problem Relation Age of Onset  . Heart attack Brother 55     Living Arrangements: Spouse/significant other   Abuse/Neglect [facility]) Physical Abuse: Denies Verbal Abuse: Denies Sexual Abuse: Denies Allergies:   Allergies  Allergen Reactions  . Nitroglycerin Other (See Comments)    Hypotension, syncope, bradycardia    ACT Assessment Complete:  NO Objective: Blood pressure 110/58, pulse 90, temperature 98.2 F (36.8 C), temperature source Oral, resp. rate 21, height 5' 11"  (1.803 m), weight 146.058 kg (322 lb), SpO2 94 %.Body mass index is 44.93  kg/(m^2). Results for orders placed or performed during the hospital encounter of 10/10/14 (from the past 72 hour(s))  Glucose, capillary     Status: Abnormal   Collection Time: 10/19/14 11:19 AM  Result Value Ref Range   Glucose-Capillary 193 (H) 70 - 99 mg/dL  Glucose, capillary     Status: Abnormal   Collection Time: 10/19/14  4:09 PM  Result Value Ref Range   Glucose-Capillary 147 (H) 70 - 99 mg/dL   Comment 1 Documented in Chart    Comment 2 Notify RN   Glucose, capillary     Status: Abnormal   Collection Time: 10/19/14  9:12 PM  Result Value Ref Range   Glucose-Capillary 142 (H) 70 - 99 mg/dL   Comment 1 Documented in Chart    Comment 2 Notify RN   Glucose, capillary  Status: Abnormal   Collection Time: 10/20/14  6:07 AM  Result Value Ref Range   Glucose-Capillary 141 (H) 70 - 99 mg/dL   Comment 1 Documented in Chart    Comment 2 Notify RN   Basic metabolic panel     Status: Abnormal   Collection Time: 10/20/14  9:18 AM  Result Value Ref Range   Sodium 133 (L) 135 - 145 mmol/L    Comment: Please note change in reference range.   Potassium 4.0 3.5 - 5.1 mmol/L    Comment: Please note change in reference range.   Chloride 96 96 - 112 mEq/L   CO2 26 19 - 32 mmol/L   Glucose, Bld 212 (H) 70 - 99 mg/dL   BUN 27 (H) 6 - 23 mg/dL   Creatinine, Ser 1.27 0.50 - 1.35 mg/dL   Calcium 8.5 8.4 - 10.5 mg/dL   GFR calc non Af Amer 68 (L) >90 mL/min   GFR calc Af Amer 79 (L) >90 mL/min    Comment: (NOTE) The eGFR has been calculated using the CKD EPI equation. This calculation has not been validated in all clinical situations. eGFR's persistently <90 mL/min signify possible Chronic Kidney Disease.    Anion gap 11 5 - 15  Magnesium     Status: None   Collection Time: 10/20/14  9:18 AM  Result Value Ref Range   Magnesium 2.2 1.5 - 2.5 mg/dL  Glucose, capillary     Status: Abnormal   Collection Time: 10/20/14 11:27 AM  Result Value Ref Range   Glucose-Capillary 210 (H) 70 -  99 mg/dL   Comment 1 Notify RN   Glucose, capillary     Status: Abnormal   Collection Time: 10/20/14  4:30 PM  Result Value Ref Range   Glucose-Capillary 141 (H) 70 - 99 mg/dL  Glucose, capillary     Status: Abnormal   Collection Time: 10/20/14  8:44 PM  Result Value Ref Range   Glucose-Capillary 221 (H) 70 - 99 mg/dL   Comment 1 Documented in Chart    Comment 2 Notify RN   Glucose, capillary     Status: Abnormal   Collection Time: 10/21/14  3:03 AM  Result Value Ref Range   Glucose-Capillary 147 (H) 70 - 99 mg/dL   Comment 1 Notify RN   Blood gas, arterial     Status: Abnormal   Collection Time: 10/21/14  3:19 AM  Result Value Ref Range   FIO2 0.21 %   Delivery systems ROOM AIR    pH, Arterial 7.411 7.350 - 7.450   pCO2 arterial 40.8 35.0 - 45.0 mmHg   pO2, Arterial 65.6 (L) 80.0 - 100.0 mmHg   Bicarbonate 25.5 (H) 20.0 - 24.0 mEq/L   TCO2 26.7 0 - 100 mmol/L   Acid-Base Excess 1.3 0.0 - 2.0 mmol/L   O2 Saturation 91.2 %   Patient temperature 98.6    Collection site LEFT RADIAL    Drawn by [phone]    Sample type ARTERIAL DRAW    Allens test (pass/fail) PASS PASS  Brain natriuretic peptide     Status: Abnormal   Collection Time: 10/21/14  3:24 AM  Result Value Ref Range   B Natriuretic Peptide 172.3 (H) 0.0 - 100.0 pg/mL    Comment: Please note change in reference range.  CBC     Status: Abnormal   Collection Time: 10/21/14  5:46 AM  Result Value Ref Range   WBC 15.3 (H) 4.0 - 10.5 K/uL  RBC 3.10 (L) 4.22 - 5.81 MIL/uL   Hemoglobin 9.1 (L) 13.0 - 17.0 g/dL   HCT 28.0 (L) 39.0 - 52.0 %   MCV 90.3 78.0 - 100.0 fL   MCH 29.4 26.0 - 34.0 pg   MCHC 32.5 30.0 - 36.0 g/dL   RDW 14.0 11.5 - 15.5 %   Platelets 366 150 - 400 K/uL  Comprehensive metabolic panel     Status: Abnormal   Collection Time: 10/21/14  5:46 AM  Result Value Ref Range   Sodium 133 (L) 135 - 145 mmol/L    Comment: Please note change in reference range.   Potassium 4.9 3.5 - 5.1 mmol/L    Comment:  Please note change in reference range. DELTA CHECK NOTED    Chloride 98 96 - 112 mEq/L   CO2 26 19 - 32 mmol/L   Glucose, Bld 164 (H) 70 - 99 mg/dL   BUN 35 (H) 6 - 23 mg/dL   Creatinine, Ser 1.39 (H) 0.50 - 1.35 mg/dL   Calcium 8.2 (L) 8.4 - 10.5 mg/dL   Total Protein 6.3 6.0 - 8.3 g/dL   Albumin 2.7 (L) 3.5 - 5.2 g/dL   AST 25 0 - 37 U/L   ALT 30 0 - 53 U/L   Alkaline Phosphatase 425 (H) 39 - 117 U/L   Total Bilirubin 1.8 (H) 0.3 - 1.2 mg/dL   GFR calc non Af Amer 61 (L) >90 mL/min   GFR calc Af Amer 71 (L) >90 mL/min    Comment: (NOTE) The eGFR has been calculated using the CKD EPI equation. This calculation has not been validated in all clinical situations. eGFR's persistently <90 mL/min signify possible Chronic Kidney Disease.    Anion gap 9 5 - 15  Glucose, capillary     Status: Abnormal   Collection Time: 10/21/14  6:00 AM  Result Value Ref Range   Glucose-Capillary 150 (H) 70 - 99 mg/dL   Comment 1 Documented in Chart    Comment 2 Notify RN   Urinalysis, Routine w reflex microscopic     Status: Abnormal   Collection Time: 10/21/14 10:43 AM  Result Value Ref Range   Color, Urine AMBER (A) YELLOW    Comment: BIOCHEMICALS MAY BE AFFECTED BY COLOR   APPearance CLEAR CLEAR   Specific Gravity, Urine 1.019 1.005 - 1.030   pH 5.5 5.0 - 8.0   Glucose, UA NEGATIVE NEGATIVE mg/dL   Hgb urine dipstick NEGATIVE NEGATIVE   Bilirubin Urine NEGATIVE NEGATIVE   Ketones, ur NEGATIVE NEGATIVE mg/dL   Protein, ur NEGATIVE NEGATIVE mg/dL   Urobilinogen, UA 2.0 (H) 0.0 - 1.0 mg/dL   Nitrite NEGATIVE NEGATIVE   Leukocytes, UA NEGATIVE NEGATIVE    Comment: MICROSCOPIC NOT DONE ON URINES WITH NEGATIVE PROTEIN, BLOOD, LEUKOCYTES, NITRITE, OR GLUCOSE <1000 mg/dL.  Glucose, capillary     Status: Abnormal   Collection Time: 10/21/14 11:02 AM  Result Value Ref Range   Glucose-Capillary 181 (H) 70 - 99 mg/dL  Glucose, capillary     Status: Abnormal   Collection Time: 10/21/14  4:15 PM   Result Value Ref Range   Glucose-Capillary 152 (H) 70 - 99 mg/dL  Glucose, capillary     Status: Abnormal   Collection Time: 10/21/14  9:11 PM  Result Value Ref Range   Glucose-Capillary 156 (H) 70 - 99 mg/dL   Comment 1 Documented in Chart    Comment 2 Notify RN   Glucose, capillary     Status:  Abnormal   Collection Time: 10/22/14  1:41 AM  Result Value Ref Range   Glucose-Capillary 151 (H) 70 - 99 mg/dL   Comment 1 Documented in Chart    Comment 2 Notify RN   CBC     Status: Abnormal   Collection Time: 10/22/14  3:16 AM  Result Value Ref Range   WBC 14.9 (H) 4.0 - 10.5 K/uL   RBC 3.11 (L) 4.22 - 5.81 MIL/uL   Hemoglobin 8.9 (L) 13.0 - 17.0 g/dL   HCT 27.5 (L) 39.0 - 52.0 %   MCV 88.4 78.0 - 100.0 fL   MCH 28.6 26.0 - 34.0 pg   MCHC 32.4 30.0 - 36.0 g/dL   RDW 14.0 11.5 - 15.5 %   Platelets 423 (H) 150 - 400 K/uL  Basic metabolic panel     Status: Abnormal   Collection Time: 10/22/14  3:16 AM  Result Value Ref Range   Sodium 130 (L) 135 - 145 mmol/L    Comment: Please note change in reference range.   Potassium 4.7 3.5 - 5.1 mmol/L    Comment: Please note change in reference range.   Chloride 95 (L) 96 - 112 mEq/L   CO2 29 19 - 32 mmol/L   Glucose, Bld 129 (H) 70 - 99 mg/dL   BUN 30 (H) 6 - 23 mg/dL   Creatinine, Ser 1.24 0.50 - 1.35 mg/dL   Calcium 8.2 (L) 8.4 - 10.5 mg/dL   GFR calc non Af Amer 70 (L) >90 mL/min   GFR calc Af Amer 81 (L) >90 mL/min    Comment: (NOTE) The eGFR has been calculated using the CKD EPI equation. This calculation has not been validated in all clinical situations. eGFR's persistently <90 mL/min signify possible Chronic Kidney Disease.    Anion gap 6 5 - 15  Glucose, capillary     Status: Abnormal   Collection Time: 10/22/14  6:37 AM  Result Value Ref Range   Glucose-Capillary 126 (H) 70 - 99 mg/dL   Labs are reviewed.  Current Facility-Administered Medications  Medication Dose Route Frequency Provider Last Rate Last Dose  . 0.9 %   sodium chloride infusion  250 mL Intravenous Continuous Rexene Alberts, MD   250 mL at 10/14/14 0455  . 0.9 %  sodium chloride infusion  250 mL Intravenous PRN Rexene Alberts, MD   Stopped at 10/16/14 2000  . amiodarone (PACERONE) tablet 400 mg  400 mg Oral BID John Giovanni, PA-C   400 mg at 10/21/14 2153  . antiseptic oral rinse (CPC / CETYLPYRIDINIUM CHLORIDE 0.05%) solution 7 mL  7 mL Mouth Rinse q12n4p Rexene Alberts, MD   7 mL at 10/21/14 1704  . aspirin EC tablet 325 mg  325 mg Oral Daily Rexene Alberts, MD   325 mg at 10/21/14 1014  . atorvastatin (LIPITOR) tablet 80 mg  80 mg Oral q1800 Rexene Alberts, MD   80 mg at 10/21/14 1702  . bisacodyl (DULCOLAX) EC tablet 10 mg  10 mg Oral Daily Rexene Alberts, MD   10 mg at 10/18/14 1030   Or  . bisacodyl (DULCOLAX) suppository 10 mg  10 mg Rectal Daily Rexene Alberts, MD      . chlorhexidine (PERIDEX) 0.12 % solution 15 mL  15 mL Mouth Rinse BID Rexene Alberts, MD   15 mL at 10/22/14 0800  . dextromethorphan-guaiFENesin (MUCINEX DM) 30-600 MG per 12 hr tablet 2 tablet  2 tablet  Oral BID Rexene Alberts, MD   2 tablet at 10/21/14 2153  . docusate sodium (COLACE) capsule 200 mg  200 mg Oral Daily Rexene Alberts, MD   200 mg at 10/21/14 1014  . insulin aspart (novoLOG) injection 0-20 Units  0-20 Units Subcutaneous TID WC Rexene Alberts, MD   4 Units at 10/21/14 1702  . insulin aspart (novoLOG) injection 0-5 Units  0-5 Units Subcutaneous QHS Rexene Alberts, MD   2 Units at 10/20/14 2157  . insulin detemir (LEVEMIR) injection 50 Units  50 Units Subcutaneous Daily John Giovanni, PA-C   50 Units at 10/21/14 1016  . lisinopril (PRINIVIL,ZESTRIL) tablet 10 mg  10 mg Oral Daily Wayne E Gold, PA-C   10 mg at 10/21/14 1000  . metFORMIN (GLUCOPHAGE) tablet 500 mg  500 mg Oral BID WC Erin Barrett, PA-C   500 mg at 10/21/14 1702  . metoprolol (LOPRESSOR) injection 2.5-5 mg  2.5-5 mg Intravenous Q2H PRN Rexene Alberts, MD   5 mg at 10/16/14 0943  .  metoprolol (LOPRESSOR) tablet 50 mg  50 mg Oral BID Rexene Alberts, MD   50 mg at 10/21/14 2300  . ondansetron (ZOFRAN) injection 4 mg  4 mg Intravenous Q6H PRN Rexene Alberts, MD      . oxyCODONE (Oxy IR/ROXICODONE) immediate release tablet 5-10 mg  5-10 mg Oral Q4H PRN Erin Barrett, PA-C      . pantoprazole (PROTONIX) EC tablet 40 mg  40 mg Oral Daily Rexene Alberts, MD   40 mg at 10/21/14 1014  . phenol (CHLORASEPTIC) mouth spray 1 spray  1 spray Mouth/Throat PRN Melrose Nakayama, MD      . sodium chloride 0.9 % injection 10-40 mL  10-40 mL Intracatheter Q12H Rexene Alberts, MD      . sodium chloride 0.9 % injection 10-40 mL  10-40 mL Intracatheter PRN Rexene Alberts, MD      . sodium chloride 0.9 % injection 3 mL  3 mL Intravenous Q12H Rexene Alberts, MD   3 mL at 10/15/14 2200  . sodium chloride 0.9 % injection 3 mL  3 mL Intravenous PRN Rexene Alberts, MD      . sodium chloride 0.9 % injection 3 mL  3 mL Intravenous Q12H Rexene Alberts, MD   3 mL at 10/21/14 2153  . sodium chloride 0.9 % injection 3 mL  3 mL Intravenous PRN Rexene Alberts, MD      . traMADol Veatrice Bourbon) tablet 50-100 mg  50-100 mg Oral Q4H PRN Rexene Alberts, MD   100 mg at 10/22/14 0130    Psychiatric Specialty Exam: Physical Exam as per history and physical  ROS depression, anxiety  Blood pressure 110/58, pulse 90, temperature 98.2 F (36.8 C), temperature source Oral, resp. rate 21, height 5' 11"  (1.803 m), weight 146.058 kg (322 lb), SpO2 94 %.Body mass index is 44.93 kg/(m^2).  General Appearance: Guarded  Eye Contact::  Fair  Speech:  Clear and Coherent and Slow  Volume:  Decreased  Mood:  Depressed  Affect:  Tearful  Thought Process:  Coherent and Goal Directed  Orientation:  Full (Time, Place, and Person)  Thought Content:  WDL  Suicidal Thoughts:  No  Homicidal Thoughts:  No  Memory:  Immediate;   Fair Recent;   Fair  Judgement:  Fair  Insight:  Fair  Psychomotor Activity:  Decreased   Concentration:  Fair  Recall:  Fair  Fund of Knowledge:Good  Language: Good  Akathisia:  NA  Handed:  Right  AIMS (if indicated):     Assets:  Communication Skills Desire for Improvement Financial Resources/Insurance Housing Intimacy Leisure Time Resilience Social Support Talents/Skills Transportation  Sleep:      Musculoskeletal: Strength & Muscle Tone: decreased Gait & Station: unable to stand Patient leans: N/A  Treatment Plan Summary: Daily contact with patient to assess and evaluate symptoms and progress in treatment Medication management  Will start Citalopram 20 mg PO QD which can be increased to 40 mg and than 60 mg if he tolerate well and show clinical improvement with time.   Azha Constantin,JANARDHAHA R. 10/22/2014 9:22 AM

## 2014-10-22 NOTE — Anesthesia Preprocedure Evaluation (Addendum)
Anesthesia Evaluation  Patient identified by MRN, date of birth, ID band Patient awake    Reviewed: Allergy & Precautions, NPO status , Patient's Chart, lab work & pertinent test results, reviewed documented beta blocker date and time   Airway Mallampati: II   Neck ROM: Full    Dental  (+) Poor Dentition   Pulmonary former smoker (quit 2009),  breath sounds clear to auscultation        Cardiovascular hypertension, Pt. on medications + CAD and + Past MI Rhythm:Regular  ACB x 4 10/13/2014, EF 40% prior dilated ischemic myopathy, frequent PVC now, for dental ext   Neuro/Psych CVA    GI/Hepatic negative GI ROS, Neg liver ROS,   Endo/Other  diabetes, Type 2  Renal/GU GFR 81     Musculoskeletal   Abdominal (+) + obese,   Peds  Hematology   Anesthesia Other Findings   Reproductive/Obstetrics                            Anesthesia Physical Anesthesia Plan  ASA: III  Anesthesia Plan: General   Post-op Pain Management:    Induction: Intravenous  Airway Management Planned: Oral ETT and Video Laryngoscope Planned  Additional Equipment:   Intra-op Plan:   Post-operative Plan: Extubation in OR  Informed Consent: I have reviewed the patients History and Physical, chart, labs and discussed the procedure including the risks, benefits and alternatives for the proposed anesthesia with the patient or authorized representative who has indicated his/her understanding and acceptance.     Plan Discussed with:   Anesthesia Plan Comments: (ACB 10/13/2014 x 4 with mammary graft, intubated glide #8 ET grade 1 view, BMI 45, good steady recovery, does have frequent PVC, for multiple dental extractions)        Anesthesia Quick Evaluation

## 2014-10-22 NOTE — Op Note (Signed)
OPERATIVE REPORT  Patient:            Mark Leach Date of Birth:  [birth year] MRN:                [id number]   DATE OF PROCEDURE:  10/22/2014  PREOPERATIVE DIAGNOSES: 1. Coronary artery disease-status post coronary bypass graft procedure  2. Chronic apical periodontitis 3. Rampant dental caries 4. Multiple retained root segments 5. Chronic periodontitis 6. Multiple loose teeth 7. Multiple impacted teeth   POSTOPERATIVE DIAGNOSES: 1. Coronary artery disease-status post coronary bypass graft procedure  2. Chronic apical periodontitis 3. Rampant dental caries 4. Multiple retained root segments 5. Chronic periodontitis 6. Multiple loose teeth 7. Multiple impacted teeth   OPERATIONS: 1. Multiple extraction of tooth numbers 1, 3, 4, 5, 6, 7, 8, 9, 10, 11, 12, 13, 14, 15, 16, 20, 21, 22, 24, 25, 26, 27, 28, 29, 30, 31, and 32. 2. 4 Quadrants of alveoloplasty   SURGEON: [name], DDS  ASSISTANT: Rory PercyLisa Ingram, (dental assistant)  ANESTHESIA: General anesthesia via oral endotracheal tube.  MEDICATIONS: 1. Ancef 3 g IV prior to invasive dental procedures. 2. Local anesthesia with a total utilization of 5 carpules each containing 34 mg of lidocaine with 0.017 mg of epinephrine as well as 2 carpules each containing 9 mg of bupivacaine with 0.009 mg of epinephrine.  SPECIMENS: There are 27 teeth that were discarded.  DRAINS: None  CULTURES: None  COMPLICATIONS: None   ESTIMATED BLOOD LOSS: 150 mLs.  INTRAVENOUS FLUIDS: 700 mLs of Lactated ringers solution.  INDICATIONS: The patient was recently diagnosed with coronary artery disease and underwent a coronary artery bypass graft procedure.  A dental consultation was then requested to evaluate poor dentition and provide treatment as indicated during this admission.  The patient was examined and treatment planned for extraction of remaining teeth with alveoloplasty and pre-prosthetic surgery as needed.  This  treatment plan was formulated to decrease the risks and complications associated with dental infection from affecting the patient's systemic health and the previous coronary artery bypass graft procedure.  OPERATIVE FINDINGS: Patient was examined operating room number 3.  The teeth were identified for extraction. Tooth numbers 1, 3, 4, 5, 6, 7, 8, 9, 10, 11, 12, 13, 14, 15, 16, 20, 21, 22, 24, 25, 26, 27, 28, 29, 30, 31, and 32 were identified and needed to be extracted at this time. The patient was noted be affected by chronic periodontitis, tooth mobility, rampant dental caries, multiple retained root segments, impacted tooth numbers 6 and 16, and chronic apical periodontitis.  DESCRIPTION OF PROCEDURE: Patient was brought to the main operating room number 3. Patient was then placed in the supine position on the operating table. General anesthesia was then induced per the anesthesia team. The patient was then prepped and draped in the usual manner for dental medicine procedure. A timeout was performed. The patient was identified and procedures were verified. A throat pack was placed at this time. The oral cavity was then thoroughly examined with the findings noted above. The patient was then ready for dental medicine procedure as follows:  Local anesthesia was then administered sequentially with a total utilization of 5 carpules each containing 34 mg of lidocaine with 0.017 mg of epinephrine as well as 2 carpules  each containing 9 mg bupivacaine with 0.009 mg of epinephrine.  The Maxillary left and right quadrants first approached. Anesthesia was then delivered utilizing infiltration with lidocaine with epinephrine. A #15 blade incision was  then made from the maxillary right tuberosity and extended to the maxillary left tuberosity.  A  surgical flap was then carefully reflected. Appropriate amounts of buccal and interseptal bone were then removed utilizing a surgical handpiece and bur and copious  amounts of sterile water around impacted tooth #16 and impacted tooth #6.  All the teeth were then subluxated with a series of straight elevators. Tooth numbers 1, 3, 4, 5, 6, 7, 8, 9, 10, 11, 12, 13, 14, 15, and impacted tooth #16 were then removed with a 150 forceps without complications. Alveoloplasty was then performed utilizing a ronguers and bone file. The surgical site was then irrigated with copious amounts of sterile saline. A piece of Surgifoam was then placed in the extraction sites areas numbers 6 and 16. Surgicel was then placed in the other extraction sites as indicated. The tissues were approximated and trimmed appropriately. The maxillary right surgical site was then closed from the maxillary right tuberosity and extended the mesial #8 utilizing 3-0 chromic gut suture in a continuous interrupted suture technique 1. The maxillary left surgical site was then closed from the maxillary left tuberosity and extended the mesial #9 utilizing 3-0 chromic gut sutures in a continuous interrupted suture technique 1. 4 interrupted sutures then placed to further close the surgical site as needed.   At this point time, the mandibular quadrants were approached. The patient was given bilateral inferior alveolar nerve blocks and long buccal nerve blocks utilizing the bupivacaine with epinephrine. Further infiltration was then achieved utilizing the lidocaine with epinephrine. A 15 blade incision was then made from the distal of number 19 and extended to the distal of #32.  A surgical flap was then carefully reflected. Tooth numbers 20, 21, 22, 24, 25, 26, 27, 28, 29, root segments 30 and 31, and tooth #32 were then removed with a 151 forceps without complications. Alveoloplasty was then performed utilizing a rongeurs and bone file. The tissues were approximated and trimmed appropriately. The surgical sites were then irrigated with copious amounts of sterile saline. A piece of Surgicel was then placed in the  extraction socket appropriately. The mandibular left surgical site was then closed from the distal of #19 and extended the mesial #25 utilizing 3-0 chromic gut suture in a continuous interrupted suture technique 1. The mandibular right surgical site was then closed from the distal of #32 and extended the mesial of #26 utilizing 3-0 chromic gut suture in a continuous interrupted suture technique 1. 2 interrupted sutures were then used to further closed surgical site as needed.   At this point time, the entire mouth was irrigated with copious amounts of sterile saline. The patient was examined for complications, seeing none, the dental medicine procedure was deemed to be complete. The throat pack was removed at this time. An oral airway was then placed at the request of the anesthesia team. A series of 4 x 4 gauze that were moistened with Amicar 5% rinse were placed in the mouth to aid hemostasis. The patient was then handed over to the anesthesia team for final disposition. After an appropriate amount of time, the patient was extubated and taken to the postanesthsia care unit in a good condition. All counts were correct for the dental medicine procedure. The patient is to continue Amicar 5% rinse. Patient is to rinse with 10 ML's every hour for the next 10 hours and then as needed for persistent oozing. Patient is to spit out excess and not swallow.  Lovenox therapy is to  be held until tomorrow and restarted in the morning as indicated if there is No significant oral bleeding     Charlynne Pander, DDS.

## 2014-10-22 NOTE — Addendum Note (Signed)
Addendum  created 10/22/14 1400 by Sebastian Acheheodore Lawyer Washabaugh, MD   Modules edited: Orders, PRL Based Order Sets

## 2014-10-23 ENCOUNTER — Encounter (HOSPITAL_COMMUNITY): Payer: Self-pay | Admitting: Thoracic Surgery (Cardiothoracic Vascular Surgery)

## 2014-10-23 DIAGNOSIS — F329 Major depressive disorder, single episode, unspecified: Secondary | ICD-10-CM | POA: Insufficient documentation

## 2014-10-23 LAB — CBC
HEMATOCRIT: 27.9 % — AB (ref 39.0–52.0)
Hemoglobin: 8.9 g/dL — ABNORMAL LOW (ref 13.0–17.0)
MCH: 28.8 pg (ref 26.0–34.0)
MCHC: 31.9 g/dL (ref 30.0–36.0)
MCV: 90.3 fL (ref 78.0–100.0)
Platelets: 408 10*3/uL — ABNORMAL HIGH (ref 150–400)
RBC: 3.09 MIL/uL — ABNORMAL LOW (ref 4.22–5.81)
RDW: 13.8 % (ref 11.5–15.5)
WBC: 14.1 10*3/uL — AB (ref 4.0–10.5)

## 2014-10-23 LAB — BASIC METABOLIC PANEL
Anion gap: 15 (ref 5–15)
BUN: 18 mg/dL (ref 6–23)
CALCIUM: 8.6 mg/dL (ref 8.4–10.5)
CHLORIDE: 96 meq/L (ref 96–112)
CO2: 23 mmol/L (ref 19–32)
Creatinine, Ser: 0.91 mg/dL (ref 0.50–1.35)
GFR calc Af Amer: >90 mL/min (ref >90)
Glucose, Bld: 132 mg/dL — ABNORMAL HIGH (ref 70–99)
Potassium: 4.5 mmol/L (ref 3.5–5.1)
SODIUM: 134 mmol/L — AB (ref 135–145)

## 2014-10-23 LAB — GLUCOSE, CAPILLARY
Glucose-Capillary: 142 mg/dL — ABNORMAL HIGH (ref 70–99)
Glucose-Capillary: 214 mg/dL — ABNORMAL HIGH (ref 70–99)
Glucose-Capillary: 194 mg/dL — ABNORMAL HIGH (ref 70–99)
Glucose-Capillary: 128 mg/dL — ABNORMAL HIGH (ref 70–99)

## 2014-10-23 MED ORDER — ADULT MULTIVITAMIN W/MINERALS CH
1.0000 | ORAL_TABLET | Freq: Every day | ORAL | Status: DC
  Administered 2014-10-23 – 2014-10-26 (×4): 1 via ORAL
  Filled 2014-10-23 (×4): qty 1

## 2014-10-23 MED ORDER — METOPROLOL SUCCINATE ER 100 MG PO TB24
100.0000 mg | ORAL_TABLET | Freq: Every day | ORAL | Status: DC
  Administered 2014-10-23 – 2014-10-26 (×4): 100 mg via ORAL
  Filled 2014-10-23 (×4): qty 1

## 2014-10-23 MED ORDER — AMOXICILLIN 500 MG PO CAPS
500.0000 mg | ORAL_CAPSULE | Freq: Three times a day (TID) | ORAL | Status: DC
  Administered 2014-10-24 – 2014-10-26 (×7): 500 mg via ORAL
  Filled 2014-10-23 (×10): qty 1

## 2014-10-23 MED ORDER — SODIUM CHLORIDE 0.9 % IR SOLN
200.0000 mL | Status: DC
  Administered 2014-10-23: 200 mL

## 2014-10-23 MED ORDER — PRO-STAT SUGAR FREE PO LIQD
30.0000 mL | Freq: Three times a day (TID) | ORAL | Status: DC
  Administered 2014-10-23 – 2014-10-26 (×8): 30 mL via ORAL
  Filled 2014-10-23 (×12): qty 30

## 2014-10-23 NOTE — Consult Note (Signed)
Psychiatry Consult follow-up note  Reason for Consult:  Depression and status post cardiac surgery Referring Physician:  [name] is an 44 y.o. male. Total Time spent with patient: 20 minutes  Assessment: AXIS I:  Adjustment Disorder with Depressed Mood AXIS II:  Deferred AXIS III:   Past Medical History  Diagnosis Date  . Coronary artery disease 01/2013    anterior MI with cath showing 95% pLAD, 90% diag, 30% OM1, 70% mRCA, 60% dRCA s/p PCI with DES to LAD and diag and EF 40%  . Ischemic dilated cardiomyopathy   . Hypertension   . Morbid obesity   . Acute on chronic combined systolic and diastolic heart failure   . Visual field defect 10/12/2014  . Diabetes mellitus type 2, uncontrolled, with complications   . Stroke 10/12/2014    Patient with several month h/o left sided visual field deficit and MRI of brain revealing:  1. Medial right occipital lobe subacute infarct. 2. At least 3 punctate areas of acute nonhemorrhagic infarct are present involving the left thalamus, high posterior right frontal lobe and high a medial posterior left frontal or parietal lobe. 3. Additional white matter changes are noted beyond the areas of  . 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   Supportive therapy provided about ongoing stressors.  Appreciate psychiatric consultation and follow up as clinically required Please contact [phone] or [phone] if needs further assistance   Subjective:   Mark Leach is a 44 y.o. male patient admitted with Depression and status post cardiac surgery .  HPI:  Mark Leach is a 44 years old white male seen, chart reviewed and  case discussed with him and his family including aunt, mother and his wife who are at bed side. Patient has been depressed with sadness, tearful, low energy, anxious about side effects of surgery and states that he does no know why he is still in hospital instead of going home etc. He has disturbed sleep but fair appetite. He has denied current suicide or homicide ideation, intention or plans. He has no previous history of mental illness. 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 Patient family stated fluoxetine does not work in his family and agree with starting citalopram after brief discussion of risks and benefits.   Interval history: Patient seen for psychiatric consultation follow-up/progress. Patient reportedly feeling better since yesterday and thinking more clearly as per patient's family reported. Patient appeared outside his room with the help of physical, occupational therapist and trying to walk around which is a beak step forward for him. Patient has been compliant with his medication and reportedly has no side effects. Patient has no safety concerns.  Medical history: Patient with morbidly obese WM with a history of AWMI 4/14 with cath a [facility] showing 95% prox LAD, 90% D1, 30% OM1, 70% mid RCA, 60% distal RCA and EF 40%. He has not seen a Cardiologist since then. He has a history of Type II DM as well. He is not on a statin. He was in his USOH until  3 days ago when he started having severe chest burning but it would only occur at night and mainly when lying down and he would get very SOB. He also was having some problems with cough. He denies any fever but had some subjective chills. He tried some alka seltzer with minimal relief. He says that he is unable to lay down to go to sleep due to the burning sensation. He just finished antibiotics for a sinus infection. He presented to [facility] ER today after finishing dinner when he had reoccurence of  the chest discomfort. He has a history of GERD. He says that his symptoms are different from what he can recall from his prior MI. In ER he was noted to have an old anterior MI but new ST elevation in V1 and V2 more pronounced than the minimal ST elevation he had on prior EKG. A code STEMI was called and he now presents to [facility] cath lab with 3/10 chest discomfort.  HPI Elements:   Location:  depression. Quality:  sad, tearful, anxious and worried. Severity:  low energy, isolated and withdrawn. Timing:  status post cardiac surgery. Duration:  one week. Context:  adjustment with depression secondary to hospitalization..  Past Psychiatric History: Past Medical History  Diagnosis Date  . Coronary artery disease 01/2013    anterior MI with cath showing 95% pLAD, 90% diag, 30% OM1, 70% mRCA, 60% dRCA s/p PCI with DES to LAD and diag and EF 40%  . Ischemic dilated cardiomyopathy   . Hypertension   . Morbid obesity   . Acute on chronic combined systolic and diastolic heart failure   . Visual field defect 10/12/2014  . Diabetes mellitus type 2, uncontrolled, with complications   . Stroke 10/12/2014    Patient with several month h/o left sided visual field deficit and MRI of brain revealing:  1. Medial right occipital lobe subacute infarct. 2. At least 3 punctate areas of acute nonhemorrhagic infarct are present involving the left thalamus, high posterior right frontal lobe and high a medial posterior left frontal or parietal lobe. 3. Additional white matter changes are noted beyond the areas of  . 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 He does not have any smokeless tobacco history on file. He reports that he does not drink alcohol or use illicit drugs. Family History  Problem Relation Age of Onset  . Heart attack Brother 34     Living Arrangements: Spouse/significant other    Abuse/Neglect [facility]) Physical Abuse: Denies Verbal Abuse: Denies Sexual Abuse: Denies Allergies:   Allergies  Allergen Reactions  . Nitroglycerin Other (See Comments)    Hypotension, syncope, bradycardia    ACT Assessment Complete:  NO Objective: Blood pressure 121/65, pulse 102, temperature 98.9 F (37.2 C), temperature source Oral, resp. rate 18, height 5' 11"  (1.803 m), weight 147.873 kg (326 lb), SpO2 95 %.Body mass index is 45.49 kg/(m^2). Results for orders placed or performed during the hospital encounter of 10/10/14 (from the past 72 hour(s))  Glucose, capillary     Status: Abnormal   Collection Time: 10/20/14  4:30 PM  Result Value Ref Range   Glucose-Capillary 141 (H) 70 - 99 mg/dL  Glucose, capillary     Status: Abnormal   Collection Time: 10/20/14  8:44 PM  Result Value Ref Range   Glucose-Capillary 221 (H)  70 - 99 mg/dL   Comment 1 Documented in Chart    Comment 2 Notify RN   Glucose, capillary     Status: Abnormal   Collection Time: 10/21/14  3:03 AM  Result Value Ref Range   Glucose-Capillary 147 (H) 70 - 99 mg/dL   Comment 1 Notify RN   Blood gas, arterial     Status: Abnormal   Collection Time: 10/21/14  3:19 AM  Result Value Ref Range   FIO2 0.21 %   Delivery systems ROOM AIR    pH, Arterial 7.411 7.350 - 7.450   pCO2 arterial 40.8 35.0 - 45.0 mmHg   pO2, Arterial 65.6 (L) 80.0 - 100.0 mmHg   Bicarbonate 25.5 (H) 20.0 - 24.0 mEq/L   TCO2 26.7 0 - 100 mmol/L   Acid-Base Excess 1.3 0.0 - 2.0 mmol/L   O2 Saturation 91.2 %   Patient temperature 98.6    Collection site LEFT RADIAL    Drawn by [id number]    Sample type ARTERIAL DRAW    Allens test (pass/fail) PASS PASS  Brain natriuretic peptide     Status: Abnormal   Collection Time: 10/21/14  3:24 AM  Result Value Ref Range   B Natriuretic Peptide 172.3 (H) 0.0 - 100.0 pg/mL    Comment: Please note change in reference range.  CBC     Status: Abnormal   Collection Time: 10/21/14  5:46 AM  Result Value  Ref Range   WBC 15.3 (H) 4.0 - 10.5 K/uL   RBC 3.10 (L) 4.22 - 5.81 MIL/uL   Hemoglobin 9.1 (L) 13.0 - 17.0 g/dL   HCT 28.0 (L) 39.0 - 52.0 %   MCV 90.3 78.0 - 100.0 fL   MCH 29.4 26.0 - 34.0 pg   MCHC 32.5 30.0 - 36.0 g/dL   RDW 14.0 11.5 - 15.5 %   Platelets 366 150 - 400 K/uL  Comprehensive metabolic panel     Status: Abnormal   Collection Time: 10/21/14  5:46 AM  Result Value Ref Range   Sodium 133 (L) 135 - 145 mmol/L    Comment: Please note change in reference range.   Potassium 4.9 3.5 - 5.1 mmol/L    Comment: Please note change in reference range. DELTA CHECK NOTED    Chloride 98 96 - 112 mEq/L   CO2 26 19 - 32 mmol/L   Glucose, Bld 164 (H) 70 - 99 mg/dL   BUN 35 (H) 6 - 23 mg/dL   Creatinine, Ser 1.39 (H) 0.50 - 1.35 mg/dL   Calcium 8.2 (L) 8.4 - 10.5 mg/dL   Total Protein 6.3 6.0 - 8.3 g/dL   Albumin 2.7 (L) 3.5 - 5.2 g/dL   AST 25 0 - 37 U/L   ALT 30 0 - 53 U/L   Alkaline Phosphatase 425 (H) 39 - 117 U/L   Total Bilirubin 1.8 (H) 0.3 - 1.2 mg/dL   GFR calc non Af Amer 61 (L) >90 mL/min   GFR calc Af Amer 71 (L) >90 mL/min    Comment: (NOTE) The eGFR has been calculated using the CKD EPI equation. This calculation has not been validated in all clinical situations. eGFR's persistently <90 mL/min signify possible Chronic Kidney Disease.    Anion gap 9 5 - 15  Glucose, capillary     Status: Abnormal   Collection Time: 10/21/14  6:00 AM  Result Value Ref Range   Glucose-Capillary 150 (H) 70 - 99 mg/dL   Comment 1 Documented in Chart  Comment 2 Notify RN   Culture, blood (routine x 2)     Status: None (Preliminary result)   Collection Time: 10/21/14 10:08 AM  Result Value Ref Range   Specimen Description BLOOD RIGHT HAND    Special Requests BOTTLES DRAWN AEROBIC ONLY 5CC    Culture             BLOOD CULTURE RECEIVED NO GROWTH TO DATE CULTURE WILL BE HELD FOR 5 DAYS BEFORE ISSUING A FINAL NEGATIVE REPORT Performed at [facility]    Report Status  PENDING   Culture, blood (routine x 2)     Status: None (Preliminary result)   Collection Time: 10/21/14 10:20 AM  Result Value Ref Range   Specimen Description BLOOD RIGHT ANTECUBITAL    Special Requests BOTTLES DRAWN AEROBIC ONLY 5CC    Culture             BLOOD CULTURE RECEIVED NO GROWTH TO DATE CULTURE WILL BE HELD FOR 5 DAYS BEFORE ISSUING A FINAL NEGATIVE REPORT Performed at [facility]    Report Status PENDING   Urinalysis, Routine w reflex microscopic     Status: Abnormal   Collection Time: 10/21/14 10:43 AM  Result Value Ref Range   Color, Urine AMBER (A) YELLOW    Comment: BIOCHEMICALS MAY BE AFFECTED BY COLOR   APPearance CLEAR CLEAR   Specific Gravity, Urine 1.019 1.005 - 1.030   pH 5.5 5.0 - 8.0   Glucose, UA NEGATIVE NEGATIVE mg/dL   Hgb urine dipstick NEGATIVE NEGATIVE   Bilirubin Urine NEGATIVE NEGATIVE   Ketones, ur NEGATIVE NEGATIVE mg/dL   Protein, ur NEGATIVE NEGATIVE mg/dL   Urobilinogen, UA 2.0 (H) 0.0 - 1.0 mg/dL   Nitrite NEGATIVE NEGATIVE   Leukocytes, UA NEGATIVE NEGATIVE    Comment: MICROSCOPIC NOT DONE ON URINES WITH NEGATIVE PROTEIN, BLOOD, LEUKOCYTES, NITRITE, OR GLUCOSE <1000 mg/dL.  Culture, Urine     Status: None   Collection Time: 10/21/14 10:43 AM  Result Value Ref Range   Specimen Description URINE, CATHETERIZED    Special Requests Normal    Colony Count NO GROWTH Performed at [facility]     Culture NO GROWTH Performed at [facility]     Report Status 10/22/2014 FINAL   Glucose, capillary     Status: Abnormal   Collection Time: 10/21/14 11:02 AM  Result Value Ref Range   Glucose-Capillary 181 (H) 70 - 99 mg/dL  Glucose, capillary     Status: Abnormal   Collection Time: 10/21/14  4:15 PM  Result Value Ref Range   Glucose-Capillary 152 (H) 70 - 99 mg/dL  Glucose, capillary     Status: Abnormal   Collection Time: 10/21/14  9:11 PM  Result Value Ref Range   Glucose-Capillary 156 (H) 70 - 99 mg/dL   Comment  1 Documented in Chart    Comment 2 Notify RN   Glucose, capillary     Status: Abnormal   Collection Time: 10/22/14  1:41 AM  Result Value Ref Range   Glucose-Capillary 151 (H) 70 - 99 mg/dL   Comment 1 Documented in Chart    Comment 2 Notify RN   CBC     Status: Abnormal   Collection Time: 10/22/14  3:16 AM  Result Value Ref Range   WBC 14.9 (H) 4.0 - 10.5 K/uL   RBC 3.11 (L) 4.22 - 5.81 MIL/uL   Hemoglobin 8.9 (L) 13.0 - 17.0 g/dL   HCT 27.5 (L) 39.0 - 52.0 %  MCV 88.4 78.0 - 100.0 fL   MCH 28.6 26.0 - 34.0 pg   MCHC 32.4 30.0 - 36.0 g/dL   RDW 14.0 11.5 - 15.5 %   Platelets 423 (H) 150 - 400 K/uL  Basic metabolic panel     Status: Abnormal   Collection Time: 10/22/14  3:16 AM  Result Value Ref Range   Sodium 130 (L) 135 - 145 mmol/L    Comment: Please note change in reference range.   Potassium 4.7 3.5 - 5.1 mmol/L    Comment: Please note change in reference range.   Chloride 95 (L) 96 - 112 mEq/L   CO2 29 19 - 32 mmol/L   Glucose, Bld 129 (H) 70 - 99 mg/dL   BUN 30 (H) 6 - 23 mg/dL   Creatinine, Ser 1.24 0.50 - 1.35 mg/dL   Calcium 8.2 (L) 8.4 - 10.5 mg/dL   GFR calc non Af Amer 70 (L) >90 mL/min   GFR calc Af Amer 81 (L) >90 mL/min    Comment: (NOTE) The eGFR has been calculated using the CKD EPI equation. This calculation has not been validated in all clinical situations. eGFR's persistently <90 mL/min signify possible Chronic Kidney Disease.    Anion gap 6 5 - 15  Glucose, capillary     Status: Abnormal   Collection Time: 10/22/14  6:37 AM  Result Value Ref Range   Glucose-Capillary 126 (H) 70 - 99 mg/dL  Glucose, capillary     Status: Abnormal   Collection Time: 10/22/14  9:56 AM  Result Value Ref Range   Glucose-Capillary 104 (H) 70 - 99 mg/dL  Glucose, capillary     Status: Abnormal   Collection Time: 10/22/14  1:36 PM  Result Value Ref Range   Glucose-Capillary 153 (H) 70 - 99 mg/dL   Comment 1 Notify RN    Comment 2 Documented in Chart   Glucose,  capillary     Status: Abnormal   Collection Time: 10/22/14  5:12 PM  Result Value Ref Range   Glucose-Capillary 127 (H) 70 - 99 mg/dL   Comment 1 Documented in Chart    Comment 2 Notify RN   Glucose, capillary     Status: Abnormal   Collection Time: 10/22/14 10:51 PM  Result Value Ref Range   Glucose-Capillary 153 (H) 70 - 99 mg/dL  Basic metabolic panel     Status: Abnormal   Collection Time: 10/23/14  5:24 AM  Result Value Ref Range   Sodium 134 (L) 135 - 145 mmol/L    Comment: Please note change in reference range.   Potassium 4.5 3.5 - 5.1 mmol/L    Comment: Please note change in reference range.   Chloride 96 96 - 112 mEq/L   CO2 23 19 - 32 mmol/L   Glucose, Bld 132 (H) 70 - 99 mg/dL   BUN 18 6 - 23 mg/dL    Comment: DELTA CHECK NOTED   Creatinine, Ser 0.91 0.50 - 1.35 mg/dL   Calcium 8.6 8.4 - 10.5 mg/dL   GFR calc non Af Amer >90 >90 mL/min   GFR calc Af Amer >90 >90 mL/min    Comment: (NOTE) The eGFR has been calculated using the CKD EPI equation. This calculation has not been validated in all clinical situations. eGFR's persistently <90 mL/min signify possible Chronic Kidney Disease.    Anion gap 15 5 - 15  CBC     Status: Abnormal   Collection Time: 10/23/14  5:24 AM  Result  Value Ref Range   WBC 14.1 (H) 4.0 - 10.5 K/uL   RBC 3.09 (L) 4.22 - 5.81 MIL/uL   Hemoglobin 8.9 (L) 13.0 - 17.0 g/dL   HCT 27.9 (L) 39.0 - 52.0 %   MCV 90.3 78.0 - 100.0 fL   MCH 28.8 26.0 - 34.0 pg   MCHC 31.9 30.0 - 36.0 g/dL   RDW 13.8 11.5 - 15.5 %   Platelets 408 (H) 150 - 400 K/uL  Glucose, capillary     Status: Abnormal   Collection Time: 10/23/14  6:21 AM  Result Value Ref Range   Glucose-Capillary 142 (H) 70 - 99 mg/dL  Glucose, capillary     Status: Abnormal   Collection Time: 10/23/14 11:21 AM  Result Value Ref Range   Glucose-Capillary 214 (H) 70 - 99 mg/dL   Comment 1 Notify RN    Comment 2 Documented in Chart    Labs are reviewed.  Current Facility-Administered  Medications  Medication Dose Route Frequency Provider Last Rate Last Dose  . 0.9 %  sodium chloride infusion  250 mL Intravenous Continuous Rexene Alberts, MD   250 mL at 10/14/14 0455  . 0.9 %  sodium chloride infusion  250 mL Intravenous PRN Rexene Alberts, MD   Stopped at 10/16/14 2000  . amiodarone (PACERONE) tablet 200 mg  200 mg Oral Daily Rexene Alberts, MD   200 mg at 10/23/14 1139  . [START ON 10/24/2014] amoxicillin (AMOXIL) capsule 500 mg  500 mg Oral 3 times per day Lenn Cal, DDS      . aspirin EC tablet 325 mg  325 mg Oral Daily Rexene Alberts, MD   325 mg at 10/23/14 1143  . atorvastatin (LIPITOR) tablet 80 mg  80 mg Oral q1800 Rexene Alberts, MD   80 mg at 10/22/14 1750  . bisacodyl (DULCOLAX) EC tablet 10 mg  10 mg Oral Daily Rexene Alberts, MD   10 mg at 10/18/14 1030   Or  . bisacodyl (DULCOLAX) suppository 10 mg  10 mg Rectal Daily Rexene Alberts, MD      . ceFAZolin (ANCEF) IVPB 1 g/50 mL premix  1 g Intravenous Q8H Lenn Cal, DDS   1 g at 10/23/14 1155  . chlorhexidine (PERIDEX) 0.12 % solution 15 mL  15 mL Mouth Rinse BID Rexene Alberts, MD   15 mL at 10/23/14 1146  . citalopram (CELEXA) tablet 20 mg  20 mg Oral QHS Durward Parcel, MD   20 mg at 10/22/14 1913  . dextromethorphan-guaiFENesin (MUCINEX DM) 30-600 MG per 12 hr tablet 2 tablet  2 tablet Oral BID Rexene Alberts, MD   2 tablet at 10/23/14 1143  . docusate sodium (COLACE) capsule 200 mg  200 mg Oral Daily Rexene Alberts, MD   200 mg at 10/21/14 1014  . feeding supplement (PRO-STAT SUGAR FREE 64) liquid 30 mL  30 mL Oral TID WC Reanne Maryland Pink, RD      . insulin aspart (novoLOG) injection 0-20 Units  0-20 Units Subcutaneous TID WC Rexene Alberts, MD   7 Units at 10/23/14 1144  . insulin aspart (novoLOG) injection 0-5 Units  0-5 Units Subcutaneous QHS Rexene Alberts, MD   2 Units at 10/20/14 2157  . insulin detemir (LEVEMIR) injection 50 Units  50 Units Subcutaneous Daily John Giovanni, PA-C   50 Units at 10/23/14 1144  . lactated ringers infusion   Intravenous Continuous  Lenn Cal, DDS      . lisinopril (PRINIVIL,ZESTRIL) tablet 10 mg  10 mg Oral Daily Wayne E Gold, PA-C   10 mg at 10/21/14 1000  . metFORMIN (GLUCOPHAGE) tablet 500 mg  500 mg Oral BID WC Erin Barrett, PA-C   500 mg at 10/23/14 0759  . metoprolol (LOPRESSOR) injection 2.5-5 mg  2.5-5 mg Intravenous Q2H PRN Rexene Alberts, MD   5 mg at 10/16/14 0943  . metoprolol succinate (TOPROL-XL) 24 hr tablet 100 mg  100 mg Oral Daily Candee Furbish, MD   100 mg at 10/23/14 1346  . morphine 2 MG/ML injection 2-4 mg  2-4 mg Intravenous Q2H PRN Lenn Cal, DDS   4 mg at 10/22/14 1846  . multivitamin with minerals tablet 1 tablet  1 tablet Oral Daily Reanne Maryland Pink, RD      . ondansetron [facility]) injection 4 mg  4 mg Intravenous Q6H PRN Rexene Alberts, MD      . oxyCODONE (Oxy IR/ROXICODONE) immediate release tablet 5-10 mg  5-10 mg Oral Q4H PRN Erin Barrett, PA-C   5 mg at 10/22/14 1750  . pantoprazole (PROTONIX) EC tablet 40 mg  40 mg Oral Daily Rexene Alberts, MD   40 mg at 10/23/14 1147  . sodium chloride 0.9 % injection 10-40 mL  10-40 mL Intracatheter Q12H Rexene Alberts, MD   10 mL at 10/22/14 1000  . sodium chloride 0.9 % injection 10-40 mL  10-40 mL Intracatheter PRN Rexene Alberts, MD      . sodium chloride 0.9 % injection 3 mL  3 mL Intravenous Q12H Rexene Alberts, MD   3 mL at 10/23/14 1157  . sodium chloride 0.9 % injection 3 mL  3 mL Intravenous Q12H Rexene Alberts, MD   3 mL at 10/23/14 1157  . sodium chloride 0.9 % injection 3 mL  3 mL Intravenous PRN Rexene Alberts, MD      . sodium chloride irrigation 0.9 % 200 mL  200 mL Irrigation Continuous Lenn Cal, DDS      . traMADol Veatrice Bourbon) tablet 50-100 mg  50-100 mg Oral Q4H PRN Rexene Alberts, MD   100 mg at 10/23/14 6213    Psychiatric Specialty Exam: Physical Exam as per history and physical  ROS depression, anxiety  Blood  pressure 121/65, pulse 102, temperature 98.9 F (37.2 C), temperature source Oral, resp. rate 18, height 5' 11"  (1.803 m), weight 147.873 kg (326 lb), SpO2 95 %.Body mass index is 45.49 kg/(m^2).  General Appearance: Guarded  Eye Contact::  Fair  Speech:  Clear and Coherent and Slow  Volume:  Decreased  Mood:  Depressed  Affect:  Tearful  Thought Process:  Coherent and Goal Directed  Orientation:  Full (Time, Place, and Person)  Thought Content:  WDL  Suicidal Thoughts:  No  Homicidal Thoughts:  No  Memory:  Immediate;   Fair Recent;   Fair  Judgement:  Fair  Insight:  Fair  Psychomotor Activity:  Decreased  Concentration:  Fair  Recall:  [employer] of Knowledge:Good  Language: Good  Akathisia:  NA  Handed:  Right  AIMS (if indicated):     Assets:  Communication Skills Desire for Improvement Financial Resources/Insurance Housing Intimacy Leisure Time Resilience Social Support Talents/Skills Transportation  Sleep:      Musculoskeletal: Strength & Muscle Tone: decreased Gait & Station: unable to stand Patient leans: N/A  Treatment Plan Summary: Daily contact with  patient to assess and evaluate symptoms and progress in treatment Medication management  Continue Citalopram 20 mg PO QD which can be increased to 40 mg and than 60 mg if he tolerate well and show clinical improvement with time.   Nataline Basara,JANARDHAHA R. 10/23/2014 2:30 PM

## 2014-10-23 NOTE — Progress Notes (Signed)
Occupational Therapy Treatment Patient Details Name: Mark Leach MRN: [id number] DOB: 09/18/1971 Today's Date: 10/23/2014    History of present illness 44 y.o. male s/p CORONARY ARTERY BYPASS GRAFTING (CABG), ON PUMP, TIMES FOUR, USING LEFT INTERNAL MAMMARY ARTERY, BILATERAL GREATER SAPHENOUS VEINS HARVESTED ENDOSCOPICALLY   OT comments  Pt fatigued after ambulation with PT earlier today. Performed grooming seated in chair.  Instructed in AE for LB ADL.  Pt attempting to practice use, but fatigue limiting.  Will continue to follow.  Follow Up Recommendations  SNF;Supervision/Assistance - 24 hour    Equipment Recommendations  3 in 1 bedside comode (bariatric)    Recommendations for Other Services      Precautions / Restrictions Precautions Precautions: Sternal;Fall Precaution Comments: educated pt and family in sternal precautions related to ADL and mobility Restrictions Weight Bearing Restrictions: Yes Other Position/Activity Restrictions: sternal precautions       Mobility Bed Mobility        General bed mobility comments: pt up in chair  Transfers   Balance     Sitting balance-Leahy Scale: Good Sitting balance - Comments: sat at edge of recliner without LOB while using UEs                           ADL Overall ADL's : Needs assistance/impaired Eating/Feeding: Set up;Sitting   Grooming: Wash/dry hands;Wash/dry face;Set up;Sitting                                 General ADL Comments: Educated pt in use of AE for LB ADL.  Pt began to attempt to practice, but was too fatigued.        Vision                     Perception     Praxis      Cognition   Behavior During Therapy: Flat affect Overall Cognitive Status: Within Functional Limits for tasks assessed                  General Comments: Family arrived after OT had completed education in use of AE, pt able to repeat education to family.    Extremity/Trunk  Assessment               Exercises   Shoulder Instructions       General Comments      Pertinent Vitals/ Pain       Pain Assessment: No/denies pain Pain Intervention(s): Premedicated before session  Home Living                                          Prior Functioning/Environment              Frequency       Progress Toward Goals  OT Goals(current goals can now be found in the care plan section)  Progress towards OT goals: Not progressing toward goals - comment (fatigued)  Acute Rehab OT Goals Patient Stated Goal: Get better  Plan Discharge plan remains appropriate    Co-evaluation                 End of Session Equipment Utilized During Treatment: Oxygen   Activity Tolerance Patient limited by fatigue   Patient Left in chair;with call bell/phone  within reach;with family/visitor present   Nurse Communication          Time: 1610-9604 OT Time Calculation (min): 22 min  Charges: OT General Charges $OT Visit: 1 Procedure OT Treatments $Self Care/Home Management : 23-37 mins  Evern Bio 10/23/2014, 1:36 PM  [phone]

## 2014-10-23 NOTE — Progress Notes (Addendum)
[address]       [place] [address]             [phone]      1 Day Post-Op Procedure(s) (LRB): Extraction of tooth #'s 1,3,4,5,6,7,8,9,10,11,12,13,14,15,16,20,21,22,24, 25,26,27,28,29,30,32 with alveoloplasty. (N/A) Subjective: Feels some soreness from mouth, but not too bad  Objective: Vital signs in last 24 hours: Temp:  [97 F (36.1 C)-98.9 F (37.2 C)] 98.9 F (37.2 C) (01/14 0500) Pulse Rate:  [98-137] 98 (01/14 0500) Cardiac Rhythm:  [-] Normal sinus rhythm;Sinus tachycardia (01/13 2217) Resp:  [18-26] 20 (01/14 0500) BP: (107-139)/(52-104) 123/52 mmHg (01/14 0500) SpO2:  [94 %-100 %] 94 % (01/14 0500) Weight:  [326 lb (147.873 kg)] 326 lb (147.873 kg) (01/14 0500)  Hemodynamic parameters for last 24 hours:    Intake/Output from previous day: 01/13 0701 - 01/14 0700 In: 800 [I.V.:800] Out: 1700 [Urine:1550; Blood:150] Intake/Output this shift:    General appearance: alert, cooperative, fatigued and no distress Heart: regular rate and rhythm Lungs: clear anteriorley, unable to sit up Abdomen: benign Extremities: no sig edema Wound: incis healing well  Lab Results:  Recent Labs  10/22/14 0316 10/23/14 0524  WBC 14.9* 14.1*  HGB 8.9* 8.9*  HCT 27.5* 27.9*  PLT 423* 408*   BMET:  Recent Labs  10/22/14 0316 10/23/14 0524  NA 130* 134*  K 4.7 4.5  CL 95* 96  CO2 29 23  GLUCOSE 129* 132*  BUN 30* 18  CREATININE 1.24 0.91  CALCIUM 8.2* 8.6    PT/INR: No results for input(s): LABPROT, INR in the last 72 hours. ABG    Component Value Date/Time   PHART 7.411 10/21/2014 0319   HCO3 25.5* 10/21/2014 0319   TCO2 26.7 10/21/2014 0319   ACIDBASEDEF 4.0* 10/14/2014 1018   O2SAT 91.2 10/21/2014 0319   CBG (last 3)   Recent Labs  10/22/14 1336 10/22/14 1712 10/22/14 2251  GLUCAP 153* 127* 153*    Meds Scheduled Meds: . amiodarone  200 mg Oral Daily  . aspirin EC  325 mg Oral Daily  . atorvastatin  80 mg Oral  q1800  . bisacodyl  10 mg Oral Daily   Or  . bisacodyl  10 mg Rectal Daily  .  ceFAZolin (ANCEF) IV  1 g Intravenous Q8H  . chlorhexidine  15 mL Mouth Rinse BID  . citalopram  20 mg Oral QHS  . dextromethorphan-guaiFENesin  2 tablet Oral BID  . docusate sodium  200 mg Oral Daily  . insulin aspart  0-20 Units Subcutaneous TID WC  . insulin aspart  0-5 Units Subcutaneous QHS  . insulin detemir  50 Units Subcutaneous Daily  . lisinopril  10 mg Oral Daily  . metFORMIN  500 mg Oral BID WC  . metoprolol tartrate  50 mg Oral BID  . pantoprazole  40 mg Oral Daily  . sodium chloride  10-40 mL Intracatheter Q12H  . sodium chloride  3 mL Intravenous Q12H  . sodium chloride  3 mL Intravenous Q12H   Continuous Infusions: . sodium chloride    . lactated ringers     PRN Meds:.sodium chloride, metoprolol, morphine injection, ondansetron (ZOFRAN) IV, oxyCODONE, sodium chloride, sodium chloride, traMADol  Xrays No results found.  Assessment/Plan: S/P Procedure(s) (LRB): Extraction of tooth #'s 1,3,4,5,6,7,8,9,10,11,12,13,14,15,16,20,21,22,24, 25,26,27,28,29,30,32 with alveoloplasty. (N/A)  1 stable 2 dental - appears stable, plans per DDS 3 needs aggressive rehab 4 leukocytosis stable, creat improved, H/H stable 5 rhythm stable, much less ectopy  6 glucose with good control   LOS: 13 days    GOLD,WAYNE E 10/23/2014  I have seen and examined the patient and agree with the assessment and plan as outlined.  Mood in much better spirits today.  Making considerable improvement.  OWEN,CLARENCE H 10/23/2014 3:06 PM

## 2014-10-23 NOTE — Progress Notes (Signed)
Physical Therapy Treatment Patient Details Name: Mark Leach MRN: [id number] DOB: 08-Feb-1971 Today's Date: 10/23/2014    History of Present Illness 44 y.o. male s/p CORONARY ARTERY BYPASS GRAFTING (CABG), ON PUMP, TIMES FOUR, USING LEFT INTERNAL MAMMARY ARTERY, BILATERAL GREATER SAPHENOUS VEINS HARVESTED ENDOSCOPICALLY    PT Comments    Progressing towards physical therapy goals. Continues to require min assist for bed mobility and frequent cues to safely maintain sternal precautions. Ambulates with multiple rest breaks up to 85 feet today while using a RW for stability. HR up to 115 bpm, SpO2 down to 90% on room air, improves to 96% on 1.5L supplemental O2. Complains of bilateral foot pain of the "arches;" asked family to bring in his tennis shoes for support while ambulating. Patient will continue to benefit from skilled physical therapy services to further improve independence with functional mobility.   Follow Up Recommendations  CIR     Equipment Recommendations  Rolling walker with 5" wheels (bariatric)    Recommendations for Other Services Rehab consult     Precautions / Restrictions Precautions Precautions: Sternal;Fall Precaution Comments: reviewed sternal precautions and pt can verbalize but then does not keep functionally when getting up, verbal and tactile cues needed frequently. Provided additonal handout for pt and family. Restrictions Weight Bearing Restrictions: Yes Other Position/Activity Restrictions: sternal precautions    Mobility  Bed Mobility Overal bed mobility: Needs Assistance Bed Mobility: Rolling;Sidelying to Sit Rolling: Supervision Sidelying to sit: HOB elevated;Min guard       General bed mobility comments: Min assist with education for log roll technique and truncal support into seated position with HOB reais. Neede dcues to prevent pushing for safety of sternal precautions. Rolls with cues for technique and no assist.  Transfers Overall  transfer level: Needs assistance Equipment used: Rolling walker (2 wheeled) Transfers: Sit to/from Stand Sit to Stand: Min assist;From elevated surface         General transfer comment: Min assist for boost and balance x2 from elevated bed surface. VC for hand placement over chest to maintain sternal precautions. Height of bed adjusted similar to bed at home per wife. Slow to rise with loss of balance to posterior until hands lightly placed on RW for feedback.  Ambulation/Gait Ambulation/Gait assistance: Min guard Ambulation Distance (Feet): 85 Feet Assistive device: Rolling walker (2 wheeled) Gait Pattern/deviations: Step-through pattern;Decreased stride length;Trunk flexed;Antalgic Gait velocity: very slow   General Gait Details: Close guard for safety. Required >5 standing rest breaks to complete ambulatory distance. Frequent cues for upright posture and to prevent weight bearing through UEs, using RW only for feedback while maintaining sternal precautions. Moderately antalgic gait pattern, pt reports "arches" of feet hurt. Very diaphoretic with 2/4 dyspnea, SpO2 down to 90% on room air, 96% on 1.5L supplemental O2.   Stairs            Wheelchair Mobility    Modified Rankin (Stroke Patients Only)       Balance                                    Cognition Arousal/Alertness: Awake/alert Behavior During Therapy: WFL for tasks assessed/performed Overall Cognitive Status: Within Functional Limits for tasks assessed                      Exercises General Exercises - Lower Extremity Ankle Circles/Pumps: AROM;Both;10 reps;Seated Quad Sets: Strengthening;Both;10 reps;Seated Gluteal Sets: Strengthening;Both;10  reps;Seated    General Comments        Pertinent Vitals/Pain Pain Assessment: No/denies pain Pain Intervention(s): Monitored during session    Home Living                      Prior Function            PT Goals (current  goals can now be found in the care plan section) Acute Rehab PT Goals PT Goal Formulation: With patient Time For Goal Achievement: 10/29/14 Potential to Achieve Goals: Good Progress towards PT goals: Progressing toward goals    Frequency  Min 3X/week    PT Plan Current plan remains appropriate    Co-evaluation             End of Session Equipment Utilized During Treatment: Gait belt;Oxygen Activity Tolerance: Patient tolerated treatment well Patient left: in chair;with call bell/phone within reach;with family/visitor present     Time: [id number] PT Time Calculation (min) (ACUTE ONLY): 36 min  Charges:  $Gait Training: 8-22 mins $Therapeutic Activity: 8-22 mins                    G Codes:      Berton MountBarbour, Fujiko Picazo S 10/23/2014, 11:51 AM Charlsie MerlesLogan Secor Alvar Malinoski, PT [phone]

## 2014-10-23 NOTE — Progress Notes (Signed)
INITIAL NUTRITION ASSESSMENT  DOCUMENTATION CODES Per approved criteria  -Morbid Obesity   INTERVENTION: Provided and reviewed "Difficulty Eating for People with Diabetes" and "Carbohydrate Counting for People with Diabetes" handouts from the Academy of Nutrition and Dietetics Provide Pro-Stat TID with meals, each supplement provides 100 kcal and 15 grams of protein When diet is advanced, provide Glucerna Shakes TID between meals, each supplement provides 220 kcal and 10 grams of protein Provide Multivitamin with minerals daily Encourage adequate healthful PO intake  NUTRITION DIAGNOSIS: Predicted sub optimal nutrient intake related to depression and new edentulism as evidenced by pt's chart, 50% meal completion prior to dental extraction, and >2% weight loss since admission.   Goal: Pt to meet >/= 90% of their estimated nutrition needs   Monitor:  Diet advancement, PO intake, weight trend, labs  Reason for Assessment: Consult x 2 (Assessment of nutrition requirements/status as pt is edentulous and Consult for diet education due to Hgb A1c of 9.8%)  44 y.o. male  Admitting Dx: S/P CABG x 4  ASSESSMENT: 44 year old with CAD s/p CABG x 4 with ischemic cardiomyopathy EF 25%, PVC's, morbid obesity, AKI, depressed mood 1 Day Post-Op Procedure(s) (LRB): Extraction of tooth #'s 1,3,4,5,6,7,8,9,10,11,12,13,14,15,16,20,21,22,24, 25,26,27,28,29,30,32 with alveoloplasty.   Pt reports that he will be edentulous/without dentures for 6 months. Per nursing notes, pt was consuming 50% of most meals prior to tooth removal. Pt reports eating well PTA and maintaining his weight at 344 lbs. He feels that his A1C is elevated due to his high consumption of pasta; he denies eating sweets or drinking sugary beverages except for special occasions. He states his appetite is good; he had some jello, coffee, and water for breakfast this morning. Pt has lost >2% of his body weight in the past 2 weeks since  admission.   RD consulted for nutrition education regarding diabetes.   Lab Results  Component Value Date   HGBA1C 9.8* 10/12/2014    Provided and reviewed "Difficulty Eating for People with Diabetes" and "Carbohydrate Counting for People with Diabetes" handouts from the Academy of Nutrition and Dietetics. Discussed different food groups and their effects on blood sugar, emphasizing carbohydrate-containing foods. Provided list of carbohydrates and recommended serving sizes of common foods.  Discussed importance of controlled and consistent carbohydrate intake throughout the day. Provided examples of ways to balance meals/snacks and encouraged intake of high-fiber, whole grain complex carbohydrates. Provided sample menus and discussed food options in each food group for chewing difficulty. Teach back method used.Expect fair compliance.  Labs: low hemoglobin, low sodium, low calcium  Height: Ht Readings from Last 1 Encounters:  10/23/14  (1.803 m)    Weight: Wt Readings from Last 1 Encounters:  10/23/14 326 lb (147.873 kg)     Ideal Body Weight: 172 lbs  % Ideal Body Weight: 189%  Wt Readings from Last 10 Encounters:  10/23/14 326 lb (147.873 kg)  admission weight 331 lbs  Usual Body Weight: 344 lbs  % Usual Body Weight: 95%  BMI:  Body mass index is 45.49 kg/(m^2).  Estimated Nutritional Needs: Kcal: 2200-2400 Protein: 115-130 grams Fluid: 3.4 L/day  Skin: +2 RLE and LLE edema; closed incisions on chest, left leg, right leg, and groin  Diet Order: Diet clear liquid  EDUCATION NEEDS: -Education needs addressed   Intake/Output Summary (Last 24 hours) at 10/23/14 1118 Last data filed at 10/23/14 0500  Gross per 24 hour  Intake    800 ml  Output   1700 ml  Net   -  900 ml    Last BM: 1/11   Labs:   Recent Labs Lab 10/20/14 0918 10/21/14 0546 10/22/14 0316 10/23/14 0524  NA 133* 133* 130* 134*  K 4.0 4.9 4.7 4.5  CL 96 98 95* 96  CO2 26 26 29 23    BUN 27* 35* 30* 18  CREATININE 1.27 1.39* 1.24 0.91  CALCIUM 8.5 8.2* 8.2* 8.6  MG 2.2  --   --   --   GLUCOSE 212* 164* 129* 132*    CBG (last 3)   Recent Labs  10/22/14 1712 10/22/14 2251 10/23/14 0621  GLUCAP 127* 153* 142*    Scheduled Meds: . amiodarone  200 mg Oral Daily  . aspirin EC  325 mg Oral Daily  . atorvastatin  80 mg Oral q1800  . bisacodyl  10 mg Oral Daily   Or  . bisacodyl  10 mg Rectal Daily  .  ceFAZolin (ANCEF) IV  1 g Intravenous Q8H  . chlorhexidine  15 mL Mouth Rinse BID  . citalopram  20 mg Oral QHS  . dextromethorphan-guaiFENesin  2 tablet Oral BID  . docusate sodium  200 mg Oral Daily  . insulin aspart  0-20 Units Subcutaneous TID WC  . insulin aspart  0-5 Units Subcutaneous QHS  . insulin detemir  50 Units Subcutaneous Daily  . lisinopril  10 mg Oral Daily  . metFORMIN  500 mg Oral BID WC  . metoprolol succinate  100 mg Oral Daily  . pantoprazole  40 mg Oral Daily  . sodium chloride  10-40 mL Intracatheter Q12H  . sodium chloride  3 mL Intravenous Q12H  . sodium chloride  3 mL Intravenous Q12H    Continuous Infusions: . sodium chloride    . lactated ringers      Past Medical History  Diagnosis Date  . Coronary artery disease 01/2013    anterior MI with cath showing 95% pLAD, 90% diag, 30% OM1, 70% mRCA, 60% dRCA s/p PCI with DES to LAD and diag and EF 40%  . Ischemic dilated cardiomyopathy   . Hypertension   . Morbid obesity   . Acute on chronic combined systolic and diastolic heart failure   . Visual field defect 10/12/2014  . Diabetes mellitus type 2, uncontrolled, with complications   . Stroke 10/12/2014    Patient with several month h/o left sided visual field deficit and MRI of brain revealing:  1. Medial right occipital lobe subacute infarct. 2. At least 3 punctate areas of acute nonhemorrhagic infarct are present involving the left thalamus, high posterior right frontal lobe and high a medial posterior left frontal or parietal  lobe. 3. Additional white matter changes are noted beyond the areas of  . S/P CABG x 4 10/13/2014    LIMA to LAD, SVG to D1, SVG to OM, SVG to PDA, EVH via bilateral thighs    Past Surgical History  Procedure Laterality Date  . [facility]iac [facility]  01/2013    [facility] [place]  . Coronary angioplasty  01/2013    PCI with stenting of LAD  . Cholecystectomy    . Left heart cath N/A 10/10/2014    Procedure: LEFT HEART CATH;  Surgeon: Lennette Biharihomas A Kelly, MD;  [facility];  Service: Cardiovascular;  Laterality: N/A;  . Coronary artery bypass graft N/A 10/13/2014    Procedure: CORONARY ARTERY BYPASS GRAFTING (CABG), ON PUMP, TIMES FOUR, USING LEFT INTERNAL MAMMARY ARTERY, BILATERAL GREATER SAPHENOUS VEINS HARVESTED ENDOSCOPICALLY;  Surgeon: Purcell Nailslarence H Owen,  MD;  Location: [facility] OR;  Service: Open Heart Surgery;  Laterality: N/A;    Ian Malkin RD, LDN Inpatient Clinical Dietitian Pager: [phone] After Hours Pager: [phone]

## 2014-10-23 NOTE — Clinical Social Work Note (Signed)
CSW spoke with patient and family at bedside to discuss SNF.  Family and patient are not agreeable to SNF placement at this time and would like home health services. RNCM aware  Patients wife also asked about disability application being started at the hospital- CSW spoke with financial counseling who stated that patient must be self pay for them to refer for disability application.  CSW informed family that they will need to go to DSS to complete application.  CSW signing off.  Merlyn LotJenna Holoman, LCSWA Clinical Social Worker [phone]

## 2014-10-23 NOTE — Progress Notes (Addendum)
CARDIAC REHAB PHASE I   PRE:  Rate/Rhythm: 102 ST  BP:  Supine:   Sitting: 121/65  Standing:    SaO2: 96 1 1/2 L  MODE:  Ambulation: 216 ft   POST:  Rate/Rhythm: 120  BP:  Supine:   Sitting: 133/68  Standing:    SaO2: 95 2L 1345-1435 Assisted X 2 used walker and O2 2L to ambulate. Gait steady with walker. Pt c/o of both of his feet hurting, arches. He was able to walk 216 feet. VS stable. He took one sitting rest stop in w/c we pushed behind him walking. Pt back to recliner after walk with call light in reach and family present. Ask family to bring him some shoes to walk in.  Melina CopaLisa Marcela Alatorre RN 10/23/2014 2:31 PM

## 2014-10-23 NOTE — Progress Notes (Addendum)
Subjective:  Dental work complete. No CP.   Objective:  Vital Signs in the last 24 hours: Temp:  [97 F (36.1 C)-98.9 F (37.2 C)] 98.9 F (37.2 C) (01/14 0500) Pulse Rate:  [98-137] 98 (01/14 0500) Resp:  [18-26] 20 (01/14 0500) BP: (107-139)/(52-104) 123/52 mmHg (01/14 0500) SpO2:  [94 %-100 %] 94 % (01/14 0500) Weight:  [326 lb (147.873 kg)] 326 lb (147.873 kg) (01/14 0500)  Intake/Output from previous day: 01/13 0701 - 01/14 0700 In: 800 [I.V.:800] Out: 1700 [Urine:1550; Blood:150]   Physical Exam: General: Well developed, well nourished, in no acute distress. Head:  Normocephalic and atraumatic. Lungs: Clear to auscultation and percussion. Distant BS (obese) Heart: Normal S1 and S2. No Ectopy, No murmur, rubs or gallops. Scar healing.  Abdomen: soft, non-tender, positive bowel sounds. Obese Extremities: No clubbing or cyanosis. No edema. Neurologic: Alert and oriented x 3.    Lab Results:  Recent Labs  10/22/14 0316 10/23/14 0524  WBC 14.9* 14.1*  HGB 8.9* 8.9*  PLT 423* 408*    Recent Labs  10/22/14 0316 10/23/14 0524  NA 130* 134*  K 4.7 4.5  CL 95* 96  CO2 29 23  GLUCOSE 129* 132*  BUN 30* 18  CREATININE 1.24 0.91   No results for input(s): TROPONINI in the last 72 hours.  Invalid input(s): CK, MB Hepatic Function Panel  Recent Labs  10/21/14 0546  PROT 6.3  ALBUMIN 2.7*  AST 25  ALT 30  ALKPHOS 425*  BILITOT 1.8*      Telemetry: Freq PVC's, no afib Personally viewed.  Cardiac Studies:  EF pre op 25-30% ECG: Qtc , SR, PVC's  Scheduled Meds: . amiodarone  200 mg Oral Daily  . aspirin EC  325 mg Oral Daily  . atorvastatin  80 mg Oral q1800  . bisacodyl  10 mg Oral Daily   Or  . bisacodyl  10 mg Rectal Daily  .  ceFAZolin (ANCEF) IV  1 g Intravenous Q8H  . chlorhexidine  15 mL Mouth Rinse BID  . citalopram  20 mg Oral QHS  . dextromethorphan-guaiFENesin  2 tablet Oral BID  . docusate sodium  200 mg Oral Daily  .  insulin aspart  0-20 Units Subcutaneous TID WC  . insulin aspart  0-5 Units Subcutaneous QHS  . insulin detemir  50 Units Subcutaneous Daily  . lisinopril  10 mg Oral Daily  . metFORMIN  500 mg Oral BID WC  . metoprolol tartrate  50 mg Oral BID  . pantoprazole  40 mg Oral Daily  . sodium chloride  10-40 mL Intracatheter Q12H  . sodium chloride  3 mL Intravenous Q12H  . sodium chloride  3 mL Intravenous Q12H   Continuous Infusions: . sodium chloride    . lactated ringers     PRN Meds:.sodium chloride, metoprolol, morphine injection, ondansetron (ZOFRAN) IV, oxyCODONE, sodium chloride, sodium chloride, traMADol  Assessment/Plan:   44 year old with CAD s/p CABG x 4 with ischemic cardiomyopathy EF 25%, PVC's, morbid obesity, AKI, depressed mood  Ischemic cardiomyopathy  - metoprolol 50 BID (will change to XL , HF indication)  - lisinopril 10   - lasix on hold (creat improved 1.05-->1.27-->1.39-->1.24 --> 0.9), excellent, gently hydrate, Agree  - BNP 172, net out 7 liters.  - deconditioning noted. PT  - Will need lasix at some point   CAD  - post 4v CABG  - ASA  PVC  - expected with cardiomyopathy  - on Bb  and amiodarone (no afib) improved  Morbid obesity  - weight loss, rehab  Depressed mood  - Mother states that she has never seen him like this.  - appreciate psychiatry consult.   - they started celexa (see note)  SKAINS, MARK 10/23/2014, 9:46 AM

## 2014-10-23 NOTE — Progress Notes (Signed)
POST OPERATIVE NOTE:  10/23/2014 Mark SalkBruce A Obrien [id number]  VITALS: BP 123/52 mmHg  Pulse 98  Temp(Src) 98.9 F (37.2 C) (Oral)  Resp 20  Ht 5\' 11"  (1.803 m)  Wt 326 lb (147.873 kg)  BMI 45.49 kg/m2  SpO2 94%  LABS:  Lab Results  Component Value Date   WBC 14.1* 10/23/2014   HGB 8.9* 10/23/2014   HCT 27.9* 10/23/2014   MCV 90.3 10/23/2014   PLT 408* 10/23/2014   BMET    Component Value Date/Time   NA 134* 10/23/2014 0524   K 4.5 10/23/2014 0524   CL 96 10/23/2014 0524   CO2 23 10/23/2014 0524   GLUCOSE 132* 10/23/2014 0524   BUN 18 10/23/2014 0524   CREATININE 0.91 10/23/2014 0524   CALCIUM 8.6 10/23/2014 0524   GFRNONAA >90 10/23/2014 0524   GFRAA >90 10/23/2014 0524    Lab Results  Component Value Date   INR 1.40 10/13/2014   INR 1.05 10/12/2014   INR 1.01 10/11/2014   No results found for: PTT   Trevyon A Debroah Looprnold is status post extraction of remaining teeth with alveoloplasty in the operating room with general anesthesia on 10/22/2014.  SUBJECTIVE: Patient having some mild discomfort, but not as bad as he expected. Pain is "nominal".  EXAM: There is no sign of heme, or ooze. Sutures are intact. Clots are present. Bruising and swelling intraorally are noted. White count is elevated but going down.  ASSESSMENT: Post operative course is consistent with dental procedures performed in the operating room. The patient is now edentulous. The is atrophy of edentulous alveolar ridges.  PLAN: 1. Use chlorhexidine rinses in the morning and at bedtime. 2. Start salt water rinses every 2 hours while awake in between the chlorhexidine rinses. 3. Advance diet as tolerated per nutritional consultation. Soft diet is ok. Today. 4. Patient to follow the dentist of his choice for fabrication of upper and lower complete dentures after adequate healing and once medically stable from the previous coronary artery bypass graft procedure. 5. Continue Ancef IV antibiotics for  today and then switch to oral amoxicillin for 5 days.  [name], DDS

## 2014-10-24 LAB — GLUCOSE, CAPILLARY
Glucose-Capillary: 114 mg/dL — ABNORMAL HIGH (ref 70–99)
Glucose-Capillary: 182 mg/dL — ABNORMAL HIGH (ref 70–99)
Glucose-Capillary: 146 mg/dL — ABNORMAL HIGH (ref 70–99)
Glucose-Capillary: 158 mg/dL — ABNORMAL HIGH (ref 70–99)

## 2014-10-24 MED ORDER — LISINOPRIL 20 MG PO TABS
20.0000 mg | ORAL_TABLET | Freq: Every day | ORAL | Status: DC
  Administered 2014-10-24 – 2014-10-26 (×3): 20 mg via ORAL
  Filled 2014-10-24 (×3): qty 1

## 2014-10-24 MED ORDER — CITALOPRAM HYDROBROMIDE 40 MG PO TABS
40.0000 mg | ORAL_TABLET | Freq: Every day | ORAL | Status: DC
  Administered 2014-10-24 – 2014-10-25 (×2): 40 mg via ORAL
  Filled 2014-10-24 (×3): qty 1

## 2014-10-24 MED ORDER — GLUCERNA SHAKE PO LIQD
237.0000 mL | Freq: Three times a day (TID) | ORAL | Status: DC
  Administered 2014-10-24 – 2014-10-26 (×3): 237 mL via ORAL
  Filled 2014-10-24 (×3): qty 237

## 2014-10-24 NOTE — Progress Notes (Signed)
[id number] Mother and aunt in room for education. Reviewed sternal precautions and walking instructions. Pt stated cannot use flutter valve or IS right not due to all teeth pulled. Discussed carb counting and watching sodium. Gave heart healthy and diabetic diets. Encouraged pt to walk with rolling walker at home. Walker in room. Discussed CRP 2 and [facility]. Encouraged pt to view post op video when wife here. Luetta NuttingCharlene Ginnie Marich RN BSN 10/24/2014 2:23 PM

## 2014-10-24 NOTE — Discharge Summary (Signed)
Physician Discharge Summary  Patient ID: Mark Leach MRN: [id number] DOB/AGE: 43/43/1972 43 y.o.  Admit date: 10/10/2014 Discharge date: 10/26/2014  Admission Diagnoses:  Patient Active Problem List   Diagnosis Date Noted  . Acute congestive heart failure 10/12/2014  . Visual field defect 10/12/2014  . Stroke 10/12/2014  . Acute on chronic combined systolic and diastolic heart failure   . ST elevation myocardial infarction (STEMI) involving left anterior descending (LAD) coronary artery with complication 43/63/8466  . Ischemic dilated cardiomyopathy   . Hypertension   . Morbid obesity   . Diabetes mellitus type 2, uncontrolled, with complications   . Coronary artery disease 01/08/2013   Discharge Diagnoses:   Patient Active Problem List   Diagnosis Date Noted  . Adjustment disorder with mixed anxiety and depressed mood   . 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 Acute congestive heart failure 10/12/2014  . Visual field defect 10/12/2014  . Stroke 10/12/2014  . Acute on chronic combined systolic and diastolic heart failure   . ST elevation myocardial infarction (STEMI) involving left anterior descending (LAD) coronary artery with complication 43/93/5701  . Ischemic dilated cardiomyopathy   . Hypertension   . Morbid obesity   . Diabetes mellitus type 2, uncontrolled, with complications   . Coronary artery disease 01/08/2013   Discharged Condition: good  History of Present-   Mark Leach is a 44 yo morbidly obese white male with known history of CAD.  He is S/P acute anterior wall myocardial infarction in 2014, ischemic cardiomyopathy with chronic combined systolic and diastolic congestive heart failure, hypertension, poorly controlled type 2 diabetes mellitus with complications, remote history of tobacco use, and a strong family history of coronary artery disease who has been referred for possible coronary artery bypass grafting. The patient has been morbidly obese for the majority of his  life. He suffered an acute anterior wall myocardial infarction in April 2014. He was treated at [facility] in [place] at that time where he underwent cardiac catheterization with PCI and stenting of the left anterior descending coronary artery. He was followed briefly by a cardiologist who subsequently discharged him from follow-up and told him to seek long-term care with a primary care physician. The patient has not been seen in follow-up by any physician until October of this year when he was hospitalized at [facility] with sinusitis and upper respiratory tract infection. He was diagnosed with type 2 diabetes mellitus and hypertension at that time, and started on oral metformin and lisinopril. He remained in his usual state of health with a persistent cough until several days prior to this admission when he suddenly began to experience resting substernal chest tightness and shortness of breath. Symptoms waxed and waned in severity, ultimately causing him to present to the ED at [facility] on 10/10/2013. He was diagnosed with acute myocardial infarction and transferred via EMS to [facility] for further therapy.   Hospital Course:    At the time of admission he was noted to have EKG changes consistent with previous anterior wall myocardial infarction with possible ongoing recurrent ST segment elevation myocardial infarction. Symptoms of chest pain and shortness of breath had nearly completely resolved. He was taken to the cardiac cath lab by Dr. Claiborne Billings where he was found have subtotal occlusion of the ostial left anterior descending coronary artery followed by chronic occlusion of the proximal left anterior descending coronary artery with severe three-vessel coronary artery disease and severe left ventricular dysfunction.  He was placed  on IV NTG, Heparin and Aggrastat.  It was felt bypass surgery would be best treatment and TCTS consult was obtained.  The patient was evaluated  by Dr. Roxy Manns on 43/12/2014 at which time it was felt coronary bypass grafting would be beneficial.  The risks and benefits of the procedure were explained to the patient and he was agreeable to proceed.    He was taken to the operating room on 10/13/2014.  He underwent CABG x 4 utilizing LIMA to LAD, SVG to Diagonal, SVG to PDA, and SVG to OM.  He underwent endoscopic saphenous vein harvest from his right and left thigh.  He tolerated the procedure without difficulty and was taken to the SICU in stable condition.  During his stay in the SICU the patient had episodes of V Tach.  He was treated with Lidocaine and Amiodarone.  He was weaned off levophed and milrinone drips as tolerated.  He was extubated on POD #1 without difficulty.  His chest tubes and arterial lines were removed without difficulty.  The patients blood sugars were well controlled and he was transitioned to Metformin and Lantus as tolerated.  He was ambulating with assistance, however he is deconditioned and would likely require rehab at discharge.  PT/OT consults were obtained and recommended SNF placement.  The patient was medically stable and transferred to step down unit on POD #3.  The patient slowly progressed.  He was noted have very poor dentition and dental consult was obtained during this admission for full teeth extraction.  This was done by Dr. Enrique Sack on 10/22/2014.  His blood sugars remain well controlled in the hospital and his diabetes medications were adjusted based on diabetes coordinator recommendations.  The patient is maintaining NSR and his pacing wires were removed without difficulty.  The patient became very depressed and was unwilling to get out of bed.  He also developed some anxiety regarding recovery from surgery and why he was hospitalized.  Psychiatry consult was placed by Dr. Marlou Porch and they recommended starting Citalopram with titration of dose as tolerated.  The patients mood and affect have improved.  He is ambulating  with assistance. He is maintaining sinus rhythm. His QT was over 480 this am and as a result, Amiodarone has been stopped. However, patient and family decline rehab placement for discharge and they wish to be discharged home with home health and this has been arranged.  He is medically stable at this time and felt safe for discharge 10/26/2014.    Consults: Psychiatry, OMFS  Significant Diagnostic Studies:   Central Aorta: 110/70  Left Ventricle: 110/12/22  ANGIOGRAPHY:  Left main: Moderate size vessel which trifurcated into an LAD and intermediate vessel and left circumflex coronary artery.  LAD: The LAD was subtotally occluded at its ostium and there was diffuse 95-99% ostial proximal stenosis and then was totally occluded in the region of the first diagonal branch. There was a gap and then faint filling of a second diagonal branch which had a stent and there was faint filling of the LAD beyond the diagonal vessel via collaterals.  Ramus Intermediate: Small caliber vessel free of significant disease.  Left circumflex: Large caliber vessel that gave rise to 2 major marginal branches and then in the posterior lateral like coronary artery. The first marginal branch was moderate size and had proximal 90% followed by 80% stenoses. A distal superior branch had 95% stenosis.  Right coronary artery: Moderate size vessel that had 95% mid stenosis and 80% distal stenosis in  the region of the acute margin. The vessel supplied the PDA. There were septal collaterals to the LAD.   Left ventriculography revealed dilated ventricle with an ejection fraction of 25% with diffuse global hypokinesis  Treatments: surgery:    Coronary Artery Bypass Grafting x 4 Left Internal Mammary Artery to Distal Left Anterior Descending Coronary Artery Saphenous Vein Graft to Posterior Descending Coronary Artery Saphenous Vein Graft to Obtuse Marginal Branch of Left Circumflex  Coronary Artery Sapheonous Vein Graft to Diagonal Branch Coronary Artery Endoscopic Vein Harvest from Bilateral Thighs  1. Multiple extraction of tooth numbers 1, 3, 4, 5, 6, 7, 8, 9, 10, 11, 12, 13, 14, 15, 16, 20, 21, 22, 24, 25, 26, 27, 28, 29, 30, 31, and 32. 2. 4 Quadrants of alveoloplasty  Disposition: Home with Home Health  Discharge Medications:   Medication List    STOP taking these medications        lisinopril-hydrochlorothiazide 10-12.5 MG per tablet  Commonly known as:  PRINZIDE,ZESTORETIC      TAKE these medications        amoxicillin 500 MG capsule  Commonly known as:  AMOXIL  Take 1 capsule (500 mg total) by mouth every 8 (eight) hours.     aspirin 325 MG EC tablet  Take 1 tablet (325 mg total) by mouth daily.     atorvastatin 80 MG tablet  Commonly known as:  LIPITOR  Take 1 tablet (80 mg total) by mouth daily at 6 PM.     citalopram 40 MG tablet  Commonly known as:  CELEXA  Take 1 tablet (40 mg total) by mouth at bedtime.     insulin detemir 100 UNIT/ML injection  Commonly known as:  LEVEMIR  Inject 0.5 mLs (50 Units total) into the skin daily.     insulin starter kit- pen needles Misc  1 kit by Other route once.     insulin starter kit- syringes Misc  1 kit by Other route once.     lisinopril 20 MG tablet  Commonly known as:  PRINIVIL,ZESTRIL  Take 1 tablet (20 mg total) by mouth daily.     metFORMIN 500 MG tablet  Commonly known as:  GLUCOPHAGE  Take 500 mg by mouth 2 (two) times daily with a meal.     metoprolol succinate 100 MG 24 hr tablet  Commonly known as:  TOPROL-XL  Take 1 tablet (100 mg total) by mouth daily. Take with or immediately following a meal.     multivitamin with minerals Tabs tablet  Take 1 tablet by mouth daily.     oxyCODONE 5 MG immediate release tablet  Commonly known as:  Oxy IR/ROXICODONE  Take 1-2 tablets (5-10 mg total) by mouth every 4 (four) hours as needed for severe pain.        The patient has been discharged on:   1.Beta Blocker:  Yes [ x  ]                              No   [   ]                              If No, reason:  2.Ace Inhibitor/ARB: Yes [ x  ]  No  [    ]                                     If No, reason:  3.Statin:   Yes [ x  ]                  No  [   ]                  If No, reason:  4.Ecasa:  Yes  [ x  ]                  No   [   ]                  If No, reason:     Discharge Instructions    Amb Referral to Cardiac Rehabilitation    Complete by:  As directed      Ambulatory referral to Nutrition and Diabetic Education    Complete by:  As directed   Patient lives in [place]. Consider outpatient follow-up at [facility]?          Follow-up Information    Follow up with Rexene Alberts, MD On 11/24/2014.   Specialty:  Cardiothoracic Surgery   Why:  Appointment is at 2:30   Contact information:   [address] [place] [address] [phone]       Follow up with [facility] On 11/24/2014.   Why:  Please get CXR 1 hour prior to your appointment with Dr. Leota Sauers information:   [facility]       Follow up with Arnoldo Lenis, MD On 11/10/2014.   Specialty:  Cardiology   Why:  appointment is at 220   Contact information:   [place] [address] [phone]       Follow up with [facility].   Why:  rolling walker arranged- delivered to room   Contact information:   [address] [place] [address] [phone]       Follow up with [facility].   Why:  HH- RN/PT/OT arranged   Contact information:   [address] [place] [address] [phone]       Follow up with Medical Doctor.   Why:  Obtain a medical doctor as soon as possible for further diabetes management and surveillance of HGA1C 9.8. Also, to help manage depression and management of Celexa       Follow up with Dentist.   Why:  Obtain a dentist to help with fabrication of upper and lower dentures      Signed: Nani Skillern 10/26/2014, 11:02 AM

## 2014-10-24 NOTE — Care Management Note (Signed)
    Page 1 of 2   10/24/2014     3:48:41 PM CARE MANAGEMENT NOTE 10/24/2014  Patient:  Mark SalkRNOLD,Mark A   Account Number:  [id number]  Date Initiated:  10/14/2014  Documentation initiated by:  MAYO,HENRIETTA  Subjective/Objective Assessment:   s/p CABG x4; lives with spouse     Action/Plan:   Anticipated DC Date:  10/24/2014   Anticipated DC Plan:  HOME W HOME HEALTH SERVICES  In-house referral  Clinical Social Worker      DC Associate Professorlanning Services  CM consult      [facility]  [facility] HEALTH  DURABLE MEDICAL EQUIPMENT   Choice offered to / List presented to:  C-1 Patient   DME arranged  [facility]      DME agency  [facility]     HH arranged  HH-1 RN  HH-2 PT  HH-3 OT      [facility]  [facility]   Status of service:  Completed, signed off Medicare Important Message given?   (If response is "NO", the following Medicare IM given date fields will be blank) Date Medicare IM given:   Medicare IM given by:   Date Additional Medicare IM given:   Additional Medicare IM given by:    Discharge Disposition:  HOME W HOME HEALTH SERVICES  Per UR Regulation:  Reviewed for med. necessity/level of care/duration of stay  If discussed at Long Length of Stay Meetings, dates discussed:    Comments:  10/24/14- 1200- Donn PieriniKristi Rada Zegers RN, BSN [phone] Pt for d/c home today- CSW has been working with pt for STSNF but pt and wife have [facility] due to insurance copay cost for ST[facility]NF- [facility]-- have spoken to both pt and wife and offered [facility] [place]- per choice they would like [place] use [facility]- call made to [place]- [facility]- but unfortunately [facility] can not accept referral due to staffing- per pt he wanted to then check with [facility] who also could not take referral due to staffing issues- choice offered again to pt who [facility]- Referral called to Hilda L[facility]Marie [facility] for  HH-RN/PT/OT- Jermaine with [facility] notified for DME needs- RW to be brought to room for discharge.  10/16/13 1411 Henrietta Mayo RN MSN BSN CCM Pt OOB to chair - was previously listed as self pay, now shows [employer], which pt verifies as his coverage.  Discussed low-cost dental clinics and provided pt with contact information.  10/14/13 1226 Henrietta Mayo RN MSN BSN CCM Received referral related to poor dentition.  [place] [facility] left requesting return call to obtain information to relay to pt. [phone] Received return call and was informed that [facility] charges full price and does not have any free or low-cost services.  Printed information about [facility], [facility], and [facility], all of which use a sliding scale to determine payment.

## 2014-10-24 NOTE — Progress Notes (Signed)
Patient ambulated early this morning with RN, RW and Oxygen for 150 ft.  Tolerated well with frequent breaks. Patient's only complaint is pain in feet (specifically his arch). Patient returned to recliner and placed back on oxygen. RN will continue to monitor and encourage patient. Bradley FerrisBrandie Tamula Morrical RN BSN 10/24/2014 8:48 AM

## 2014-10-24 NOTE — Progress Notes (Signed)
Subjective:  Tooth pain. No CP.   Objective:  Vital Signs in the last 24 hours: Temp:  [98.2 F (36.8 C)] 98.2 F (36.8 C) (01/15 0500) Pulse Rate:  [90-102] 91 (01/15 0500) Resp:  [18] 18 (01/15 0500) BP: (92-142)/(58-77) 142/74 mmHg (01/15 0500) SpO2:  [95 %-96 %] 95 % (01/15 0500) Weight:  [311 lb 15.2 oz (141.5 kg)] 311 lb 15.2 oz (141.5 kg) (01/15 0500)  Intake/Output from previous day: 01/14 0701 - 01/15 0700 In: 6 [I.V.:6] Out: 400 [Urine:400]   Physical Exam: General: Well developed, well nourished, in no acute distress. Head:  Normocephalic and atraumatic. Lungs: Clear to auscultation and percussion. Distant BS (obese) Heart: Normal S1 and S2. Mildly tachy. No Ectopy, No murmur, rubs or gallops. Scar healing.  Abdomen: soft, non-tender, positive bowel sounds. Obese Extremities: No clubbing or cyanosis. No edema. Neurologic: Alert and oriented x 3.    Lab Results:  Recent Labs  10/22/14 0316 10/23/14 0524  WBC 14.9* 14.1*  HGB 8.9* 8.9*  PLT 423* 408*    Recent Labs  10/22/14 0316 10/23/14 0524  NA 130* 134*  K 4.7 4.5  CL 95* 96  CO2 29 23  GLUCOSE 129* 132*  BUN 30* 18  CREATININE 1.24 0.91   Telemetry: Sinus tach, no afib Personally viewed.  Cardiac Studies:  EF pre op 25-30% ECG: Qtc 526ms, SR, PVC's  Scheduled Meds: . amiodarone  200 mg Oral Daily  . amoxicillin  500 mg Oral 3 times per day  . aspirin EC  325 mg Oral Daily  . atorvastatin  80 mg Oral q1800  . bisacodyl  10 mg Oral Daily   Or  . bisacodyl  10 mg Rectal Daily  . chlorhexidine  15 mL Mouth Rinse BID  . citalopram  20 mg Oral QHS  . dextromethorphan-guaiFENesin  2 tablet Oral BID  . docusate sodium  200 mg Oral Daily  . feeding supplement (GLUCERNA SHAKE)  237 mL Oral TID BM  . feeding supplement (PRO-STAT SUGAR FREE 64)  30 mL Oral TID WC  . insulin aspart  0-20 Units Subcutaneous TID WC  . insulin aspart  0-5 Units Subcutaneous QHS  . insulin detemir  50 Units  Subcutaneous Daily  . lisinopril  10 mg Oral Daily  . metFORMIN  500 mg Oral BID WC  . metoprolol succinate  100 mg Oral Daily  . multivitamin with minerals  1 tablet Oral Daily  . pantoprazole  40 mg Oral Daily  . sodium chloride  10-40 mL Intracatheter Q12H  . sodium chloride  3 mL Intravenous Q12H  . sodium chloride  3 mL Intravenous Q12H   Continuous Infusions: . sodium chloride    . lactated ringers    . sodium chloride irrigation     PRN Meds:.sodium chloride, metoprolol, morphine injection, ondansetron (ZOFRAN) IV, oxyCODONE, sodium chloride, sodium chloride, traMADol  Assessment/Plan:   44 year old with CAD s/p CABG x 4 with ischemic cardiomyopathy EF 25%, PVC's, morbid obesity, AKI, depressed mood  Ischemic cardiomyopathy  - metoprolol 50 BID (will change to XL 100mg , HF indication)  - lisinopril increase to 20 (goal)  - lasix on hold (creat improved 1.05-->1.27-->1.39-->1.24 --> 0.9), excellent  - BNP 172, net out 7.6 liters.  - deconditioning noted. PT  - Will need lasix at some point   - EF 25% pre op  CAD  - post 4v CABG  - ASA  PVC  - expected with cardiomyopathy  -  on Bb and amiodarone (no afib) improved  Morbid obesity  - weight loss, rehab  Depressed mood  - Mother states that she has never seen him like this. Improved daily.   - appreciate psychiatry consult.   - they started celexa (see note)  Mark Leach 10/24/2014, 8:41 AM

## 2014-10-24 NOTE — Progress Notes (Signed)
[address]       Mark [place] [address]             [phone]      2 Days Post-Op Procedure(s) (LRB): Extraction of tooth #'s 1,3,4,5,6,7,8,9,10,11,12,13,14,15,16,20,21,22,24, 25,26,27,28,29,30,32 with alveoloplasty. (N/A) Subjective: Feels better again today.  Mood/outlook better again.  Pain in feet b/l from increased ambulation.  Appetite good.    Objective: Vital signs in last 24 hours: Temp:  [98.2 F (36.8 C)] 98.2 F (36.8 C) (01/15 0500) Pulse Rate:  [90-102] 91 (01/15 0500) Cardiac Rhythm:  [-] Normal sinus rhythm;Sinus tachycardia (01/14 0800) Resp:  [18] 18 (01/15 0500) BP: (92-142)/(58-77) 142/74 mmHg (01/15 0500) SpO2:  [95 %-96 %] 95 % (01/15 0500) Weight:  [311 lb 15.2 oz (141.5 kg)] 311 lb 15.2 oz (141.5 kg) (01/15 0500)  Hemodynamic parameters for last 24 hours:    Intake/Output from previous day: 01/14 0701 - 01/15 0700 In: 6 [I.V.:6] Out: 400 [Urine:400] Intake/Output this shift:   General: Awake, alert, and interactive.  NAD Lungs: Breathing unlabored, CTA b/l anterior.  Unable to sit up CV: RRR, no m/g/r appreciated Abdomen: Non distended, +BS, soft, non tender Extremities: No edema, warm to touch Wound: incis well healing.   Lab Results:  Recent Labs  10/22/14 0316 10/23/14 0524  WBC 14.9* 14.1*  HGB 8.9* 8.9*  HCT 27.5* 27.9*  PLT 423* 408*   BMET:   Recent Labs  10/22/14 0316 10/23/14 0524  NA 130* 134*  K 4.7 4.5  CL 95* 96  CO2 29 23  GLUCOSE 129* 132*  BUN 30* 18  CREATININE 1.24 0.91  CALCIUM 8.2* 8.6    PT/INR: No results for input(s): LABPROT, INR in the last 72 hours. ABG    Component Value Date/Time   PHART 7.411 10/21/2014 0319   HCO3 25.5* 10/21/2014 0319   TCO2 26.7 10/21/2014 0319   ACIDBASEDEF 4.0* 10/14/2014 1018   O2SAT 91.2 10/21/2014 0319   CBG (last 3)   Recent Labs  10/23/14 1644 10/23/14 2151 10/24/14 0622  GLUCAP 194* 128* 114*    Meds Scheduled Meds: .  amiodarone  200 mg Oral Daily  . amoxicillin  500 mg Oral 3 times per day  . aspirin EC  325 mg Oral Daily  . atorvastatin  80 mg Oral q1800  . bisacodyl  10 mg Oral Daily   Or  . bisacodyl  10 mg Rectal Daily  . chlorhexidine  15 mL Mouth Rinse BID  . citalopram  20 mg Oral QHS  . dextromethorphan-guaiFENesin  2 tablet Oral BID  . docusate sodium  200 mg Oral Daily  . feeding supplement (PRO-STAT SUGAR FREE 64)  30 mL Oral TID WC  . insulin aspart  0-20 Units Subcutaneous TID WC  . insulin aspart  0-5 Units Subcutaneous QHS  . insulin detemir  50 Units Subcutaneous Daily  . lisinopril  10 mg Oral Daily  . metFORMIN  500 mg Oral BID WC  . metoprolol succinate  100 mg Oral Daily  . multivitamin with minerals  1 tablet Oral Daily  . pantoprazole  40 mg Oral Daily  . sodium chloride  10-40 mL Intracatheter Q12H  . sodium chloride  3 mL Intravenous Q12H  . sodium chloride  3 mL Intravenous Q12H   Continuous Infusions: . sodium chloride    . lactated ringers    . sodium chloride irrigation     PRN Meds:.sodium chloride, metoprolol, morphine injection, ondansetron (ZOFRAN)  IV, oxyCODONE, sodium chloride, sodium chloride, traMADol  Xrays No results found.  Assessment/Plan: S/P Procedure(s) (LRB): Extraction of tooth #'s 1,3,4,5,6,7,8,9,10,11,12,13,14,15,16,20,21,22,24, 25,26,27,28,29,30,32 with alveoloplasty. (N/A)  1 Stable 2 Dental - appears well healing, plans per DDS 3 Needs aggressive rehab 4 Leukocytosis stable, creat improved, H/H stable 5 Rhythm stable 6 Glucose remains in control   LOS: 14 days    Aquilla HackerMartensen, Henry C PA-S 10/24/2014    I have seen and examined the patient and agree with the assessment and plan as outlined.  Mark Leach has made considerable improvement over the last few days.  As long as he's able to get up and walk without too much assistance he might be able to go home with home health nursing and PT within 1-2 days.  D/C instructions reviewed  with the patient and his family.  Eustace Hur H 10/24/2014 10:28 AM

## 2014-10-24 NOTE — Progress Notes (Signed)
Patient ambulated with RN and RW for 225 feet. Sats 98% on room air. Patient praised for his efforts and encouraged to continue doing walks.  Patient returned to room on room air and placed in his recliner. Patient's family is at the bedside. Only compliant is foot pain thus patient took frequent breaks. Will continue to monitor and encourage. Bradley FerrisBrandie Braylee Lal RN BSN 10/24/2014 12:04 PM

## 2014-10-24 NOTE — Progress Notes (Signed)
1155 Pt has walked twice with RN in hallway. We will follow up as time allows. Luetta NuttingCharlene Ramya Vanbergen RN BSN 10/24/2014 11:56 AM

## 2014-10-24 NOTE — Consult Note (Signed)
Psychiatry Consult follow-up note  Reason for Consult:  Depression and status post cardiac surgery Referring Physician:  [name] is an 44 y.o. male. Total Time spent with patient: 20 minutes  Assessment: AXIS I:  Adjustment Disorder with Depressed Mood AXIS II:  Deferred AXIS III:   Past Medical History  Diagnosis Date  . Coronary artery disease 01/2013    anterior MI with cath showing 95% pLAD, 90% diag, 30% OM1, 70% mRCA, 60% dRCA s/p PCI with DES to LAD and diag and EF 40%  . Ischemic dilated cardiomyopathy   . Hypertension   . Morbid obesity   . Acute on chronic combined systolic and diastolic heart failure   . Visual field defect 10/12/2014  . Diabetes mellitus type 2, uncontrolled, with complications   . Stroke 10/12/2014    Patient with several month h/o left sided visual field deficit and MRI of brain revealing:  1. Medial right occipital lobe subacute infarct. 2. At least 3 punctate areas of acute nonhemorrhagic infarct are present involving the left thalamus, high posterior right frontal lobe and high a medial posterior left frontal or parietal lobe. 3. Additional white matter changes are noted beyond the areas of  . 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   Supportive therapy provided about ongoing stressors.  Appreciate psychiatric consultation and follow up as clinically required Please contact [phone] or [phone] if needs further assistance   Subjective:   Mark Leach is a 44 y.o. male patient admitted with Depression and status post cardiac surgery .  HPI:  Mark Leach is a 44 years old white male seen, chart reviewed and  case discussed with him and his family including aunt, mother and his wife who are at bed side. Patient has been depressed with sadness, tearful, low energy, anxious about side effects of surgery and states that he does no know why he is still in hospital instead of going home etc. He has disturbed sleep but fair appetite. He has denied current suicide or homicide ideation, intention or plans. He has no previous history of mental illness. 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 Patient family stated fluoxetine does not work in his family and agree with starting citalopram after brief discussion of risks and benefits.   Interval history: Patient has been compliant with his medication without adverse affects. Patient and his family reported he has been feeling better and able to motivate himself to do work with physical therapist and outpatient therapist. Patient and his family hoping he will be able to go home by Monday. Patient denies current suicidal/homicidal ideation, intention or plans. Patient has no evidence of psychosis. Patient appeared sitting in a chair next to his bed. Patient has no safety concerns. patient agreed to titrate his medication citalopram to 40 mg daily for better control of depression and anxiety.   Medical history: Patient with morbidly obese WM with a history of AWMI 4/14 with cath a [facility] showing 95% prox LAD, 90% D1, 30% OM1, 70% mid RCA, 60% distal RCA and  EF 40%. He has not seen a Cardiologist since then. He has a history of Type II DM as well. He is not on a statin. He was in his USOH until 3 days ago when he started having severe chest burning but it would only occur at night and mainly when lying down and he would get very SOB. He also was having some problems with cough. He denies any fever but had some subjective chills. He tried some alka seltzer with minimal relief. He says that he is unable to lay down to go to sleep due to  the burning sensation. He just finished antibiotics for a sinus infection. He presented to [facility] ER today after finishing dinner when he had reoccurence of the chest discomfort. He has a history of GERD. He says that his symptoms are different from what he can recall from his prior MI. In ER he was noted to have an old anterior MI but new ST elevation in V1 and V2 more pronounced than the minimal ST elevation he had on prior EKG. A code STEMI was called and he now presents to [facility] cath lab with 3/10 chest discomfort.  HPI Elements:   Location:  depression. Quality:  sad, tearful, anxious and worried. Severity:  low energy, isolated and withdrawn. Timing:  status post cardiac surgery. Duration:  one week. Context:  adjustment with depression secondary to hospitalization..  Past Psychiatric History: Past Medical History  Diagnosis Date  . Coronary artery disease 01/2013    anterior MI with cath showing 95% pLAD, 90% diag, 30% OM1, 70% mRCA, 60% dRCA s/p PCI with DES to LAD and diag and EF 40%  . Ischemic dilated cardiomyopathy   . Hypertension   . Morbid obesity   . Acute on chronic combined systolic and diastolic heart failure   . Visual field defect 10/12/2014  . Diabetes mellitus type 2, uncontrolled, with complications   . Stroke 10/12/2014    Patient with several month h/o left sided visual field deficit and MRI of brain revealing:  1. Medial right occipital lobe subacute infarct. 2. At least 3 punctate areas of acute nonhemorrhagic infarct are present involving the left thalamus, high posterior right frontal lobe and high a medial posterior left frontal or parietal lobe. 3. Additional white matter changes are noted beyond the areas of  . 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 He does not have any smokeless tobacco history on file. He reports that he does not drink alcohol or  use illicit drugs. Family History  Problem Relation Age of Onset  . Heart attack Brother 40     Living Arrangements: Spouse/significant other   Abuse/Neglect [facility]) Physical Abuse: Denies Verbal Abuse: Denies Sexual Abuse: Denies Allergies:   Allergies  Allergen Reactions  . Nitroglycerin Other (See Comments)    Hypotension, syncope, bradycardia    ACT Assessment Complete:  NO Objective: Blood pressure 142/74, pulse 91, temperature 98.2 F (36.8 C), temperature source Oral, resp. rate 18, height $RemoveBe'5\' 11"'pUOTiwSNJ$  (1.803 m), weight 141.5 kg (311 lb 15.2 oz), SpO2 95 %.Body mass index is 43.53 kg/(m^2). Results for orders placed or performed during the hospital encounter of 10/10/14 (from the past 72 hour(s))  Glucose, capillary     Status: Abnormal   Collection Time: 10/21/14  4:15 PM  Result Value Ref Range  Glucose-Capillary 152 (H) 70 - 99 mg/dL  Glucose, capillary     Status: Abnormal   Collection Time: 10/21/14  9:11 PM  Result Value Ref Range   Glucose-Capillary 156 (H) 70 - 99 mg/dL   Comment 1 Documented in Chart    Comment 2 Notify RN   Glucose, capillary     Status: Abnormal   Collection Time: 10/22/14  1:41 AM  Result Value Ref Range   Glucose-Capillary 151 (H) 70 - 99 mg/dL   Comment 1 Documented in Chart    Comment 2 Notify RN   CBC     Status: Abnormal   Collection Time: 10/22/14  3:16 AM  Result Value Ref Range   WBC 14.9 (H) 4.0 - 10.5 K/uL   RBC 3.11 (L) 4.22 - 5.81 MIL/uL   Hemoglobin 8.9 (L) 13.0 - 17.0 g/dL   HCT 09.2 (L) 73.3 - 10.7 %   MCV 88.4 78.0 - 100.0 fL   MCH 28.6 26.0 - 34.0 pg   MCHC 32.4 30.0 - 36.0 g/dL   RDW 88.1 17.9 - 30.5 %   Platelets 423 (H) 150 - 400 K/uL  Basic metabolic panel     Status: Abnormal   Collection Time: 10/22/14  3:16 AM  Result Value Ref Range   Sodium 130 (L) 135 - 145 mmol/L    Comment: Please note change in reference range.   Potassium 4.7 3.5 - 5.1 mmol/L    Comment: Please note change in reference range.    Chloride 95 (L) 96 - 112 mEq/L   CO2 29 19 - 32 mmol/L   Glucose, Bld 129 (H) 70 - 99 mg/dL   BUN 30 (H) 6 - 23 mg/dL   Creatinine, Ser 0.15 0.50 - 1.35 mg/dL   Calcium 8.2 (L) 8.4 - 10.5 mg/dL   GFR calc non Af Amer 70 (L) >90 mL/min   GFR calc Af Amer 81 (L) >90 mL/min    Comment: (NOTE) The eGFR has been calculated using the CKD EPI equation. This calculation has not been validated in all clinical situations. eGFR's persistently <90 mL/min signify possible Chronic Kidney Disease.    Anion gap 6 5 - 15  Glucose, capillary     Status: Abnormal   Collection Time: 10/22/14  6:37 AM  Result Value Ref Range   Glucose-Capillary 126 (H) 70 - 99 mg/dL  Glucose, capillary     Status: Abnormal   Collection Time: 10/22/14  9:56 AM  Result Value Ref Range   Glucose-Capillary 104 (H) 70 - 99 mg/dL  Glucose, capillary     Status: Abnormal   Collection Time: 10/22/14  1:36 PM  Result Value Ref Range   Glucose-Capillary 153 (H) 70 - 99 mg/dL   Comment 1 Notify RN    Comment 2 Documented in Chart   Glucose, capillary     Status: Abnormal   Collection Time: 10/22/14  5:12 PM  Result Value Ref Range   Glucose-Capillary 127 (H) 70 - 99 mg/dL   Comment 1 Documented in Chart    Comment 2 Notify RN   Glucose, capillary     Status: Abnormal   Collection Time: 10/22/14 10:51 PM  Result Value Ref Range   Glucose-Capillary 153 (H) 70 - 99 mg/dL  Basic metabolic panel     Status: Abnormal   Collection Time: 10/23/14  5:24 AM  Result Value Ref Range   Sodium 134 (L) 135 - 145 mmol/L    Comment: Please note change in  reference range.   Potassium 4.5 3.5 - 5.1 mmol/L    Comment: Please note change in reference range.   Chloride 96 96 - 112 mEq/L   CO2 23 19 - 32 mmol/L   Glucose, Bld 132 (H) 70 - 99 mg/dL   BUN 18 6 - 23 mg/dL    Comment: DELTA CHECK NOTED   Creatinine, Ser 0.91 0.50 - 1.35 mg/dL   Calcium 8.6 8.4 - 10.5 mg/dL   GFR calc non Af Amer >90 >90 mL/min   GFR calc Af Amer >90 >90  mL/min    Comment: (NOTE) The eGFR has been calculated using the CKD EPI equation. This calculation has not been validated in all clinical situations. eGFR's persistently <90 mL/min signify possible Chronic Kidney Disease.    Anion gap 15 5 - 15  CBC     Status: Abnormal   Collection Time: 10/23/14  5:24 AM  Result Value Ref Range   WBC 14.1 (H) 4.0 - 10.5 K/uL   RBC 3.09 (L) 4.22 - 5.81 MIL/uL   Hemoglobin 8.9 (L) 13.0 - 17.0 g/dL   HCT 27.9 (L) 39.0 - 52.0 %   MCV 90.3 78.0 - 100.0 fL   MCH 28.8 26.0 - 34.0 pg   MCHC 31.9 30.0 - 36.0 g/dL   RDW 13.8 11.5 - 15.5 %   Platelets 408 (H) 150 - 400 K/uL  Glucose, capillary     Status: Abnormal   Collection Time: 10/23/14  6:21 AM  Result Value Ref Range   Glucose-Capillary 142 (H) 70 - 99 mg/dL  Glucose, capillary     Status: Abnormal   Collection Time: 10/23/14 11:21 AM  Result Value Ref Range   Glucose-Capillary 214 (H) 70 - 99 mg/dL   Comment 1 Notify RN    Comment 2 Documented in Chart   Glucose, capillary     Status: Abnormal   Collection Time: 10/23/14  4:44 PM  Result Value Ref Range   Glucose-Capillary 194 (H) 70 - 99 mg/dL   Comment 1 Notify RN    Comment 2 Documented in Chart   Glucose, capillary     Status: Abnormal   Collection Time: 10/23/14  9:51 PM  Result Value Ref Range   Glucose-Capillary 128 (H) 70 - 99 mg/dL  Glucose, capillary     Status: Abnormal   Collection Time: 10/24/14  6:22 AM  Result Value Ref Range   Glucose-Capillary 114 (H) 70 - 99 mg/dL  Glucose, capillary     Status: Abnormal   Collection Time: 10/24/14 12:05 PM  Result Value Ref Range   Glucose-Capillary 182 (H) 70 - 99 mg/dL   Comment 1 Notify RN    Comment 2 Documented in Chart    Labs are reviewed.  Current Facility-Administered Medications  Medication Dose Route Frequency Provider Last Rate Last Dose  . 0.9 %  sodium chloride infusion  250 mL Intravenous Continuous Rexene Alberts, MD   250 mL at 10/14/14 0455  . 0.9 %  sodium  chloride infusion  250 mL Intravenous PRN Rexene Alberts, MD   Stopped at 10/16/14 2000  . amiodarone (PACERONE) tablet 200 mg  200 mg Oral Daily Rexene Alberts, MD   200 mg at 10/24/14 1059  . amoxicillin (AMOXIL) capsule 500 mg  500 mg Oral 3 times per day Lenn Cal, DDS   500 mg at 10/24/14 0646  . aspirin EC tablet 325 mg  325 mg Oral Daily Rexene Alberts, MD  325 mg at 10/24/14 1059  . atorvastatin (LIPITOR) tablet 80 mg  80 mg Oral q1800 Purcell Nails, MD   80 mg at 10/23/14 1629  . bisacodyl (DULCOLAX) EC tablet 10 mg  10 mg Oral Daily Purcell Nails, MD   10 mg at 10/18/14 1030   Or  . bisacodyl (DULCOLAX) suppository 10 mg  10 mg Rectal Daily Purcell Nails, MD      . chlorhexidine (PERIDEX) 0.12 % solution 15 mL  15 mL Mouth Rinse BID Purcell Nails, MD   15 mL at 10/24/14 0800  . citalopram (CELEXA) tablet 20 mg  20 mg Oral QHS Nehemiah Settle, MD   20 mg at 10/23/14 2312  . dextromethorphan-guaiFENesin (MUCINEX DM) 30-600 MG per 12 hr tablet 2 tablet  2 tablet Oral BID Purcell Nails, MD   2 tablet at 10/24/14 1059  . docusate sodium (COLACE) capsule 200 mg  200 mg Oral Daily Purcell Nails, MD   200 mg at 10/24/14 1059  . feeding supplement (GLUCERNA SHAKE) (GLUCERNA SHAKE) liquid 237 mL  237 mL Oral TID BM Lorraine Lax, RD   237 mL at 10/24/14 1058  . feeding supplement (PRO-STAT SUGAR FREE 64) liquid 30 mL  30 mL Oral TID WC Lorraine Lax, RD   30 mL at 10/24/14 0646  . insulin aspart (novoLOG) injection 0-20 Units  0-20 Units Subcutaneous TID WC Purcell Nails, MD   4 Units at 10/23/14 1850  . insulin aspart (novoLOG) injection 0-5 Units  0-5 Units Subcutaneous QHS Purcell Nails, MD   2 Units at 10/20/14 2157  . insulin detemir (LEVEMIR) injection 50 Units  50 Units Subcutaneous Daily Rowe Clack, PA-C   50 Units at 10/24/14 1058  . lactated ringers infusion   Intravenous Continuous Charlynne Pander, DDS      . lisinopril (PRINIVIL,ZESTRIL)  tablet 20 mg  20 mg Oral Daily Donato Schultz, MD   20 mg at 10/24/14 1058  . metFORMIN (GLUCOPHAGE) tablet 500 mg  500 mg Oral BID WC Erin Barrett, PA-C   500 mg at 10/24/14 0646  . metoprolol (LOPRESSOR) injection 2.5-5 mg  2.5-5 mg Intravenous Q2H PRN Purcell Nails, MD   5 mg at 10/16/14 0943  . metoprolol succinate (TOPROL-XL) 24 hr tablet 100 mg  100 mg Oral Daily Donato Schultz, MD   100 mg at 10/24/14 1058  . morphine 2 MG/ML injection 2-4 mg  2-4 mg Intravenous Q2H PRN Charlynne Pander, DDS   4 mg at 10/22/14 1846  . multivitamin with minerals tablet 1 tablet  1 tablet Oral Daily Lorraine Lax, RD   1 tablet at 10/24/14 1058  . ondansetron (ZOFRAN) injection 4 mg  4 mg Intravenous Q6H PRN Purcell Nails, MD      . oxyCODONE (Oxy IR/ROXICODONE) immediate release tablet 5-10 mg  5-10 mg Oral Q4H PRN Erin Barrett, PA-C   5 mg at 10/23/14 1630  . pantoprazole (PROTONIX) EC tablet 40 mg  40 mg Oral Daily Purcell Nails, MD   40 mg at 10/24/14 1058  . sodium chloride 0.9 % injection 10-40 mL  10-40 mL Intracatheter Q12H Purcell Nails, MD   10 mL at 10/22/14 1000  . sodium chloride 0.9 % injection 10-40 mL  10-40 mL Intracatheter PRN Purcell Nails, MD      . sodium chloride 0.9 % injection 3 mL  3 mL Intravenous Q12H Marilu Favre  Keturah Barre, MD   3 mL at 10/23/14 1157  . sodium chloride 0.9 % injection 3 mL  3 mL Intravenous Q12H Rexene Alberts, MD   3 mL at 10/23/14 2200  . sodium chloride 0.9 % injection 3 mL  3 mL Intravenous PRN Rexene Alberts, MD      . sodium chloride irrigation 0.9 % 200 mL  200 mL Irrigation Continuous Lenn Cal, DDS   200 mL at 10/23/14 1800  . traMADol (ULTRAM) tablet 50-100 mg  50-100 mg Oral Q4H PRN Rexene Alberts, MD   50 mg at 10/24/14 1059    Psychiatric Specialty Exam: Physical Exam as per history and physical  ROS depression, anxiety  Blood pressure 142/74, pulse 91, temperature 98.2 F (36.8 C), temperature source Oral, resp. rate 18, height $RemoveBe'5\' 11"'TMEHONVDD$   (1.803 m), weight 141.5 kg (311 lb 15.2 oz), SpO2 95 %.Body mass index is 43.53 kg/(m^2).  General Appearance: Guarded  Eye Contact::  Fair  Speech:  Clear and Coherent and Slow  Volume:  Decreased  Mood:  Depressed  Affect:  Tearful  Thought Process:  Coherent and Goal Directed  Orientation:  Full (Time, Place, and Person)  Thought Content:  WDL  Suicidal Thoughts:  No  Homicidal Thoughts:  No  Memory:  Immediate;   Fair Recent;   Fair  Judgement:  Fair  Insight:  Fair  Psychomotor Activity:  Decreased  Concentration:  Fair  Recall:  [employer] of Knowledge:Good  Language: Good  Akathisia:  NA  Handed:  Right  AIMS (if indicated):     Assets:  Communication Skills Desire for Improvement Financial Resources/Insurance Housing Intimacy Leisure Time Resilience Social Support Talents/Skills Transportation  Sleep:      Musculoskeletal: Strength & Muscle Tone: decreased Gait & Station: unable to stand Patient leans: N/A  Treatment Plan Summary: Daily contact with patient to assess and evaluate symptoms and progress in treatment Medication management  Increase Citalopram 40 mg PO QD which can be increased to than 60 mg if he tolerate well and show clinical improvement with time.   Vasilia Dise,JANARDHAHA R. 10/24/2014 12:45 PM

## 2014-10-25 LAB — GLUCOSE, CAPILLARY
Glucose-Capillary: 133 mg/dL — ABNORMAL HIGH (ref 70–99)
Glucose-Capillary: 162 mg/dL — ABNORMAL HIGH (ref 70–99)
Glucose-Capillary: 127 mg/dL — ABNORMAL HIGH (ref 70–99)
Glucose-Capillary: 151 mg/dL — ABNORMAL HIGH (ref 70–99)

## 2014-10-25 MED ORDER — FUROSEMIDE 40 MG PO TABS
40.0000 mg | ORAL_TABLET | Freq: Once | ORAL | Status: AC
  Administered 2014-10-25: 40 mg via ORAL
  Filled 2014-10-25: qty 1

## 2014-10-25 NOTE — Progress Notes (Signed)
Subjective:  Tooth pain. No CP. Getting better.   Objective:  Vital Signs in the last 24 hours: Temp:  [98.3 F (36.8 C)-99.1 F (37.3 C)] 98.3 F (36.8 C) (01/16 0505) Pulse Rate:  [86-94] 94 (01/16 0505) Resp:  [20] 20 (01/16 0505) BP: (102-114)/(58-64) 114/64 mmHg (01/16 0505) SpO2:  [94 %-96 %] 96 % (01/16 0505) Weight:  [328 lb 7.8 oz (149 kg)] 328 lb 7.8 oz (149 kg) (01/16 0505)  Intake/Output from previous day: 01/15 0701 - 01/16 0700 In: 600 [P.O.:600] Out: 1150 [Urine:1150]   Physical Exam: General: Well developed, well nourished, in no acute distress. Head:  Normocephalic and atraumatic. Lungs: Clear to auscultation and percussion. Distant BS (obese) Heart: Normal S1 and S2. Mildly tachy. No Ectopy, No murmur, rubs or gallops. Scar healing.  Abdomen: soft, non-tender, positive bowel sounds. Obese Extremities: No clubbing or cyanosis. No edema. Neurologic: Alert and oriented x 3.    Lab Results:  Recent Labs  10/23/14 0524  WBC 14.1*  HGB 8.9*  PLT 408*    Recent Labs  10/23/14 0524  NA 134*  K 4.5  CL 96  CO2 23  GLUCOSE 132*  BUN 18  CREATININE 0.91   Telemetry: Sinus tach, no afib Personally viewed.  Cardiac Studies:  EF pre op 25-30% ECG: Qtc , SR, PVC's  Scheduled Meds: . amiodarone  200 mg Oral Daily  . amoxicillin  500 mg Oral 3 times per day  . aspirin EC  325 mg Oral Daily  . atorvastatin  80 mg Oral q1800  . bisacodyl  10 mg Oral Daily   Or  . bisacodyl  10 mg Rectal Daily  . chlorhexidine  15 mL Mouth Rinse BID  . citalopram  40 mg Oral QHS  . dextromethorphan-guaiFENesin  2 tablet Oral BID  . docusate sodium  200 mg Oral Daily  . feeding supplement (GLUCERNA SHAKE)  237 mL Oral TID BM  . feeding supplement (PRO-STAT SUGAR FREE 64)  30 mL Oral TID WC  . furosemide  40 mg Oral Once  . insulin aspart  0-20 Units Subcutaneous TID WC  . insulin aspart  0-5 Units Subcutaneous QHS  . insulin detemir  50 Units  Subcutaneous Daily  . lisinopril  20 mg Oral Daily  . metFORMIN  500 mg Oral BID WC  . metoprolol succinate  100 mg Oral Daily  . multivitamin with minerals  1 tablet Oral Daily  . pantoprazole  40 mg Oral Daily  . sodium chloride  10-40 mL Intracatheter Q12H  . sodium chloride  3 mL Intravenous Q12H  . sodium chloride  3 mL Intravenous Q12H   Continuous Infusions: . sodium chloride    . lactated ringers    . sodium chloride irrigation     PRN Meds:.sodium chloride, metoprolol, morphine injection, ondansetron (ZOFRAN) IV, oxyCODONE, sodium chloride, sodium chloride, traMADol  Assessment/Plan:   44 year old with CAD s/p CABG x 4 with ischemic cardiomyopathy EF 25%, PVC's, morbid obesity, AKI, depressed mood  Ischemic cardiomyopathy  - metoprolol  XL , HF indication. Try to uptitrate as outpatient if BP allows.   - lisinopril increase to 20 (goal)  - lasix 40 QD (creat improved)  - BNP 172, net out 8.1 liters.  - deconditioning noted. PT  - EF 25% pre op  CAD  - post 4v CABG  - ASA  PVC  - expected with cardiomyopathy  - on Bb and amiodarone (no afib) improved  Morbid  obesity  - weight loss, rehab  Depressed mood  - Mother states that she has never seen him like this. Improved daily.   - appreciate psychiatry consult.   - they started celexa (see note)  Raydon Chappuis 10/25/2014, 9:21 AM

## 2014-10-25 NOTE — Progress Notes (Addendum)
      [address]       [place] [id number]             [phone]        3 Days Post-Op Procedure(s) (LRB): Extraction of tooth #'s 1,3,4,5,6,7,8,9,10,11,12,13,14,15,16,20,21,22,24, 25,26,27,28,29,30,32 with alveoloplasty. (N/A)  Subjective: Patient walked his furthest yet this am. He is getting stronger daily.  Objective: Vital signs in last 24 hours: Temp:  [98.3 F (36.8 C)-99.1 F (37.3 C)] 98.3 F (36.8 C) (01/16 0505) Pulse Rate:  [86-94] 94 (01/16 0505) Cardiac Rhythm:  [-] Normal sinus rhythm;Sinus tachycardia (01/15 0845) Resp:  [20] 20 (01/16 0505) BP: (102-114)/(58-64) 114/64 mmHg (01/16 0505) SpO2:  [94 %-96 %] 96 % (01/16 0505) Weight:  [328 lb 7.8 oz (149 kg)] 328 lb 7.8 oz (149 kg) (01/16 0505)  Pre op weight 150 kg Current Weight  10/25/14 328 lb 7.8 oz (149 kg)      Intake/Output from previous day: 01/15 0701 - 01/16 0700 In: 600 [P.O.:600] Out: 1150 [Urine:1150]   Physical Exam:  Cardiovascular: RRR Pulmonary: Clear to auscultation bilaterally; no rales, wheezes, or rhonchi. Abdomen: Soft, non tender, bowel sounds present. Extremities: Mild bilateral lower extremity edema. Wounds: Clean and dry.  No erythema or signs of infection.  Lab Results: CBC: Recent Labs  10/23/14 0524  WBC 14.1*  HGB 8.9*  HCT 27.9*  PLT 408*   BMET:  Recent Labs  10/23/14 0524  NA 134*  K 4.5  CL 96  CO2 23  GLUCOSE 132*  BUN 18  CREATININE 0.91  CALCIUM 8.6    PT/INR:  Lab Results  Component Value Date   INR 1.40 10/13/2014   INR 1.05 10/12/2014   INR 1.01 10/11/2014   ABG:  INR: Will add last result for INR, ABG once components are confirmed Will add last 4 CBG results once components are confirmed  Assessment/Plan:  1. CV - ST in the 110's while ambulating in hallway this am. Now SR in the 90's while sitting in chair. On Amiodarone 200 daily, Toprol XL 100 daily, Lisinopril 20 daily. 2.  Pulmonary - Encourage  incentive spirometer 3.  Acute blood loss anemia - Last H and H 8.9 and 27.9 4.DM-CBGs 146/158/133. Continue Insulin. Pre op HGA1C  9.8. Will need follow up with medical doctor after discharge 5. 2 sutures from dental surgery were hanging below lips. Sutures trimmed. 6. Possibly discharge in am  ZIMMERMAN,DONIELLE MPA-C 10/25/2014,7:47 AM  Mouth looks ok Agree with above note and plan of care patient examined and medical record reviewed,agree with above note. VAN TRIGT III,Brienna Bass 10/25/2014

## 2014-10-26 LAB — BASIC METABOLIC PANEL
Anion gap: 8 (ref 5–15)
BUN: 22 mg/dL (ref 6–23)
CALCIUM: 8.7 mg/dL (ref 8.4–10.5)
CHLORIDE: 97 meq/L (ref 96–112)
CO2: 28 mmol/L (ref 19–32)
CREATININE: 0.88 mg/dL (ref 0.50–1.35)
GFR calc non Af Amer: >90 mL/min (ref >90)
Glucose, Bld: 192 mg/dL — ABNORMAL HIGH (ref 70–99)
POTASSIUM: 4.9 mmol/L (ref 3.5–5.1)
Sodium: 133 mmol/L — ABNORMAL LOW (ref 135–145)

## 2014-10-26 LAB — GLUCOSE, CAPILLARY
GLUCOSE-CAPILLARY: 155 mg/dL — AB (ref 70–99)
Glucose-Capillary: 177 mg/dL — ABNORMAL HIGH (ref 70–99)

## 2014-10-26 MED ORDER — INSULIN STARTER KIT- SYRINGES (ENGLISH): 1.0000 | Freq: Once | Status: DC

## 2014-10-26 MED ORDER — ADULT MULTIVITAMIN W/MINERALS CH: 1.0000 | ORAL_TABLET | Freq: Every day | ORAL | Status: DC

## 2014-10-26 MED ORDER — ATORVASTATIN CALCIUM 80 MG PO TABS: 80.0000 mg | ORAL_TABLET | Freq: Every day | ORAL | Status: DC

## 2014-10-26 MED ORDER — ASPIRIN 325 MG PO TBEC: 325.0000 mg | DELAYED_RELEASE_TABLET | Freq: Every day | ORAL | Status: DC

## 2014-10-26 MED ORDER — CITALOPRAM HYDROBROMIDE 40 MG PO TABS: 40.0000 mg | ORAL_TABLET | Freq: Every day | ORAL | Status: DC

## 2014-10-26 MED ORDER — INSULIN STARTER KIT- PEN NEEDLES (ENGLISH): 1.0000 | Freq: Once | Status: DC

## 2014-10-26 MED ORDER — OXYCODONE HCL 5 MG PO TABS: 5.0000 mg | ORAL_TABLET | ORAL | Status: DC | PRN

## 2014-10-26 MED ORDER — LISINOPRIL 20 MG PO TABS: 20.0000 mg | ORAL_TABLET | Freq: Every day | ORAL | Status: DC

## 2014-10-26 MED ORDER — INSULIN DETEMIR 100 UNIT/ML ~~LOC~~ SOLN: 50.0000 [IU] | Freq: Every day | SUBCUTANEOUS | Status: DC

## 2014-10-26 MED ORDER — METOPROLOL SUCCINATE ER 100 MG PO TB24: 100.0000 mg | ORAL_TABLET | Freq: Every day | ORAL | Status: DC

## 2014-10-26 MED ORDER — AMOXICILLIN 500 MG PO CAPS: 500.0000 mg | ORAL_CAPSULE | Freq: Three times a day (TID) | ORAL | Status: DC

## 2014-10-26 NOTE — Discharge Instructions (Signed)
MOUTH CARE AFTER SURGERY ° °FACTS: °· Ice used in ice bag helps keep the swelling down, and can help lessen the pain. °· It is easier to treat pain BEFORE it happens. °· Spitting disturbs the clot and may cause bleeding to start again, or to get worse. °· Smoking delays healing and can cause complications. °· Sharing prescriptions can be dangerous.  Do not take medications not recently prescribed for you. °· Antibiotics may stop birth control pills from working.  Use other means of birth control while on antibiotics. °· Warm salt water rinses after the first 24 hours will help lessen the swelling:  Use 1/2 teaspoonful of table salt per oz.of water. ° °DO NOT: °· Do not spit.  Do not drink through a straw. °· Strongly advised not to smoke, dip snuff or chew tobacco at least for 3 days. °· Do not eat sharp or crunchy foods.  Avoid the area of surgery when chewing. °· Do not stop your antibiotics before your instructions say to do so. °· Do not eat hot foods until bleeding has stopped.  If you need to, let your food cool down to room temperature. ° °EXPECT: °· Some swelling, especially first 2-3 days. °· Soreness or discomfort in varying degrees.  Follow your dentist's instructions about how to handle pain before it starts. °· Pinkish saliva or light blood in saliva, or on your pillow in the morning.  This can last around 24 hours. °· Bruising inside or outside the mouth.  This may not show up until 2-3 days after surgery.  Don't worry, it will go away in time. °· Pieces of "bone" may work themselves loose.  It's OK.  If they bother you, let us know. ° °WHAT TO DO IMMEDIATELY AFTER SURGERY: °· Bite on the gauze with steady pressure for 1-2 hours.  Don't chew on the gauze. °· Do not lie down flat.  Raise your head support especially for the first 24 hours. °· Apply ice to your face on the side of the surgery.  You may apply it 20 minutes on and a few minutes off.  Ice for 8-12 hours.  You may use ice up to 24  hours. °· Before the numbness wears off, take a pain pill as instructed. °· Prescription pain medication is not always required. ° °SWELLING: °· Expect swelling for the first couple of days.  It should get better after that. °· If swelling increases 3 days or so after surgery; let us know as soon as possible. ° °FEVER: °· Take Tylenol every 4 hours if needed to lower your temperature, especially if it is at 100F or higher. °· Drink lots of fluids. °· If the fever does not go away, let us know. ° °BREATHING TROUBLE: °· Any unusual difficulty breathing means you have to have someone bring you to the emergency room ASAP ° °BLEEDING: °· Light oozing is expected for 24 hours or so. °· Prop head up with pillows °· Avoid spitting °· Do not confuse bright red fresh flowing blood with lots of saliva colored with a little bit of blood. °· If you notice some bleeding, place gauze or a tea bag where it is bleeding and apply CONSTANT pressure by biting down for 1 hour.  Avoid talking during this time.  Do not remove the gauze or tea bag during this hour to "check" the bleeding. °· If you notice bright RED bleeding FLOWING out of particular area, and filling the floor of your mouth, put   a wad of gauze on that area, bite down firmly and constantly.  Call us immediately.  If we're closed, have someone bring you to the emergency room.  ORAL HYGIENE:  Brush your teeth as usual after meals and before bedtime.  Use a soft toothbrush around the area of surgery.  DO NOT AVOID BRUSHING.  Otherwise bacteria(germs) will grow and may delay healing or encourage infection.  Since you cannot spit, just gently rinse and let the water flow out of your mouth.  DO NOT SWISH HARD.  EATING:  Cool liquids are a good point to start.  Increase to soft foods as tolerated.  PRESCRIPTIONS:  Follow the directions for your prescriptions exactly as written.  If Dr. Kristin Bruins gave you a narcotic pain medication, do not drive, operate  machinery or drink alcohol when on that medication.  QUESTIONS:  Call our office during office hours [phone] or call the Emergency Room at [phone].  Coronary Artery Bypass Grafting, Care After Refer to this sheet in the next few weeks. These instructions provide you with information on caring for yourself after your procedure. Your health care provider may also give you more specific instructions. Your treatment has been planned according to current medical practices, but problems sometimes occur. Call your health care provider if you have any problems or questions after your procedure. WHAT TO EXPECT AFTER THE PROCEDURE Recovery from surgery will be different for everyone. Some people feel well after 3 or 4 weeks, while for others it takes longer. After your procedure, it is typical to have the following:  Nausea and a lack of appetite.   Constipation.  Weakness and fatigue.   Depression or irritability.   Pain or discomfort at your incision site. HOME CARE INSTRUCTIONS  Take medicines only as directed by your health care provider. Do not stop taking medicines or start any new medicines without first checking with your health care provider.  Take your pulse as directed by your health care provider.  Perform deep breathing as directed by your health care provider. If you were given a device called an incentive spirometer, use it to practice deep breathing several times a day. Support your chest with a pillow or your arms when you take deep breaths or cough.  Keep incision areas clean, dry, and protected. Remove or change any bandages (dressings) only as directed by your health care provider. You may have skin adhesive strips over the incision areas. Do not take the strips off. They will fall off on their own.  Check incision areas daily for any swelling, redness, or drainage.  If incisions were made in your legs, do the following:  Avoid crossing your legs.   Avoid  sitting for long periods of time. Change positions every 30 minutes.   Elevate your legs when you are sitting.  Wear compression stockings as directed by your health care provider. These stockings help keep blood clots from forming in your legs.  Take showers once your health care provider approves. Until then, only take sponge baths. Pat incisions dry. Do not rub incisions with a washcloth or towel. Do not take baths, swim, or use a hot tub until your health care provider approves.  Eat foods that are high in fiber, such as raw fruits and vegetables, whole grains, beans, and nuts. Meats should be lean cut. Avoid canned, processed, and fried foods.  Drink enough fluid to keep your urine clear or pale yellow.  Weigh yourself every day. This helps identify if you  are retaining fluid that may make your heart and lungs work harder.  Rest and limit activity as directed by your health care provider. You may be instructed to:  Stop any activity at once if you have chest pain, shortness of breath, irregular heartbeats, or dizziness. Get help right away if you have any of these symptoms.  Move around frequently for short periods or take short walks as directed by your health care provider. Increase your activities gradually. You may need physical therapy or cardiac rehabilitation to help strengthen your muscles and build your endurance.  Avoid lifting, pushing, or pulling anything heavier than 10 lb (4.5 kg) for at least 6 weeks after surgery.  Do not drive until your health care provider approves.  Ask your health care provider when you may return to work.  Ask your health care provider when you may resume sexual activity.  Keep all follow-up visits as directed by your health care provider. This is important. SEEK MEDICAL CARE IF:  You have swelling, redness, increasing pain, or drainage at the site of an incision.  You have a fever.  You have swelling in your ankles or legs.  You have  pain in your legs.   You gain 2 or more pounds (0.9 kg) a day.  You are nauseous or vomit.  You have diarrhea. SEEK IMMEDIATE MEDICAL CARE IF:  You have chest pain that goes to your jaw or arms.  You have shortness of breath.   You have a fast or irregular heartbeat.   You notice a "clicking" in your breastbone (sternum) when you move.   You have numbness or weakness in your arms or legs.  You feel dizzy or light-headed.  MAKE SURE YOU:  Understand these instructions.  Will watch your condition.  Will get help right away if you are not doing well or get worse. Document Released: 04/15/2005 Document Revised: 02/10/2014 Document Reviewed: 03/05/2013 [facility] Patient Information 2015 [employer], [place]. This information is not intended to replace advice given to you by your health care provider. Make sure you discuss any questions you have with your health care provider.  Activity: 1.May walk up steps                2.No lifting more than ten pounds for four weeks.                 3.No driving for four weeks.                4.Stop any activity that causes chest pain, shortness of breath, dizziness,sweating or excessive weakness.                5.Avoid straining.                6.Continue with your breathing exercises daily.  Diet: Diabetic diet and Low fat, Low salt diet  Wound Care: May shower.  Clean wounds with mild soap and water daily. Contact the office at [phone] if any problems arise.  Coronary Artery Bypass Grafting, Care After Refer to this sheet in the next few weeks. These instructions provide you with information on caring for yourself after your procedure. Your health care provider may also give you more specific instructions. Your treatment has been planned according to current medical practices, but problems sometimes occur. Call your health care provider if you have any problems or questions after your procedure. WHAT TO EXPECT AFTER THE  PROCEDURE Recovery from surgery will be different for everyone. Some  people feel well after 3 or 4 weeks, while for others it takes longer. After your procedure, it is typical to have the following:  Nausea and a lack of appetite.   Constipation.  Weakness and fatigue.   Depression or irritability.   Pain or discomfort at your incision site. HOME CARE INSTRUCTIONS  Take medicines only as directed by your health care provider. Do not stop taking medicines or start any new medicines without first checking with your health care provider.  Take your pulse as directed by your health care provider.  Perform deep breathing as directed by your health care provider. If you were given a device called an incentive spirometer, use it to practice deep breathing several times a day. Support your chest with a pillow or your arms when you take deep breaths or cough.  Keep incision areas clean, dry, and protected. Remove or change any bandages (dressings) only as directed by your health care provider. You may have skin adhesive strips over the incision areas. Do not take the strips off. They will fall off on their own.  Check incision areas daily for any swelling, redness, or drainage.  If incisions were made in your legs, do the following:  Avoid crossing your legs.   Avoid sitting for long periods of time. Change positions every 30 minutes.   Elevate your legs when you are sitting.  Wear compression stockings as directed by your health care provider. These stockings help keep blood clots from forming in your legs.  Take showers once your health care provider approves. Until then, only take sponge baths. Pat incisions dry. Do not rub incisions with a washcloth or towel. Do not take baths, swim, or use a hot tub until your health care provider approves.  Eat foods that are high in fiber, such as raw fruits and vegetables, whole grains, beans, and nuts. Meats should be lean cut. Avoid canned,  processed, and fried foods.  Drink enough fluid to keep your urine clear or pale yellow.  Weigh yourself every day. This helps identify if you are retaining fluid that may make your heart and lungs work harder.  Rest and limit activity as directed by your health care provider. You may be instructed to:  Stop any activity at once if you have chest pain, shortness of breath, irregular heartbeats, or dizziness. Get help right away if you have any of these symptoms.  Move around frequently for short periods or take short walks as directed by your health care provider. Increase your activities gradually. You may need physical therapy or cardiac rehabilitation to help strengthen your muscles and build your endurance.  Avoid lifting, pushing, or pulling anything heavier than 10 lb (4.5 kg) for at least 6 weeks after surgery.  Do not drive until your health care provider approves.  Ask your health care provider when you may return to work.  Ask your health care provider when you may resume sexual activity.  Keep all follow-up visits as directed by your health care provider. This is important. SEEK MEDICAL CARE IF:  You have swelling, redness, increasing pain, or drainage at the site of an incision.  You have a fever.  You have swelling in your ankles or legs.  You have pain in your legs.   You gain 2 or more pounds (0.9 kg) a day.  You are nauseous or vomit.  You have diarrhea. SEEK IMMEDIATE MEDICAL CARE IF:  You have chest pain that goes to your jaw or arms.  You have shortness of breath.   You have a fast or irregular heartbeat.   You notice a "clicking" in your breastbone (sternum) when you move.   You have numbness or weakness in your arms or legs.  You feel dizzy or light-headed.  MAKE SURE YOU:  Understand these instructions.  Will watch your condition.  Will get help right away if you are not doing well or get worse. Document Released: 04/15/2005  Document Revised: 02/10/2014 Document Reviewed: 03/05/2013 [facility] Patient Information 2015 [place], [place]. This information is not intended to replace advice given to you by your health care provider. Make sure you discuss any questions you have with your health care provider.

## 2014-10-26 NOTE — Progress Notes (Signed)
Subjective:  Mild mouth pain. No CP. No SOB. Getting better.   Objective:  Vital Signs in the last 24 hours: Temp:  [98 F (36.7 C)-98.6 F (37 C)] 98.6 F (37 C) (01/17 0543) Pulse Rate:  [88-92] 92 (01/17 0543) Resp:  [18] 18 (01/17 0543) BP: (99-121)/(50-63) 111/54 mmHg (01/17 0543) SpO2:  [95 %-98 %] 95 % (01/17 0543) Weight:  [303 lb (137.44 kg)] 303 lb (137.44 kg) (01/17 0500)  Intake/Output from previous day: 01/16 0701 - 01/17 0700 In: -  Out: 300 [Urine:300]   Physical Exam: General: Well developed, well nourished, in no acute distress. Head:  Normocephalic and atraumatic. Lungs: Clear to auscultation and percussion. Distant BS (obese) Heart: Normal S1 and S2. Mildly tachy. No Ectopy, No murmur, rubs or gallops. Scar healing.  Abdomen: soft, non-tender, positive bowel sounds. Obese Extremities: No clubbing or cyanosis. 1+ edema. Neurologic: Alert and oriented x 3.    Lab Results: No results for input(s): WBC, HGB, PLT in the last 72 hours. No results for input(s): NA, K, CL, CO2, GLUCOSE, BUN, CREATININE in the last 72 hours. Telemetry: Sinus tach, no afib Personally viewed. Rare PVC Cardiac Studies:  EF pre op 25-30% ECG: Qtc , SR, PVC's  Scheduled Meds: . amiodarone  200 mg Oral Daily  . amoxicillin  500 mg Oral 3 times per day  . aspirin EC  325 mg Oral Daily  . atorvastatin  80 mg Oral q1800  . bisacodyl  10 mg Oral Daily   Or  . bisacodyl  10 mg Rectal Daily  . chlorhexidine  15 mL Mouth Rinse BID  . citalopram  40 mg Oral QHS  . dextromethorphan-guaiFENesin  2 tablet Oral BID  . docusate sodium  200 mg Oral Daily  . feeding supplement (GLUCERNA SHAKE)  237 mL Oral TID BM  . feeding supplement (PRO-STAT SUGAR FREE 64)  30 mL Oral TID WC  . insulin aspart  0-20 Units Subcutaneous TID WC  . insulin aspart  0-5 Units Subcutaneous QHS  . insulin detemir  50 Units Subcutaneous Daily  . lisinopril  20 mg Oral Daily  . metFORMIN  500 mg Oral  BID WC  . metoprolol succinate  100 mg Oral Daily  . multivitamin with minerals  1 tablet Oral Daily  . pantoprazole  40 mg Oral Daily  . sodium chloride  10-40 mL Intracatheter Q12H  . sodium chloride  3 mL Intravenous Q12H  . sodium chloride  3 mL Intravenous Q12H   Continuous Infusions: . sodium chloride    . lactated ringers    . sodium chloride irrigation     PRN Meds:.sodium chloride, metoprolol, morphine injection, ondansetron (ZOFRAN) IV, oxyCODONE, sodium chloride, sodium chloride, traMADol  Assessment/Plan:   44 year old with CAD s/p CABG x 4 with ischemic cardiomyopathy EF 25%, PVC's, morbid obesity, AKI, depressed mood  Ischemic cardiomyopathy  - metoprolol  XL , HF indication. Try to uptitrate as outpatient if BP allows.   - lisinopril increase to 20 (goal)  - lasix 40 QD (creat improved)  - BNP 172, net out 8.1 liters.  - deconditioning noted. PT  - EF 25% pre op  CAD  - post 4v CABG  - ASA  PVC  - expected with cardiomyopathy  - on Bb and amiodarone (no afib) improved  Morbid obesity  - weight loss, rehab  Depressed mood  - appreciate psychiatry consult.   - they started celexa (see note). Improved  Hopeful DC  Opel Lejeune 10/26/2014, 9:20 AM

## 2014-10-26 NOTE — Progress Notes (Addendum)
      [address]       [place] [id number]             [phone]        4 Days Post-Op Procedure(s) (LRB): Extraction of tooth #'s 1,3,4,5,6,7,8,9,10,11,12,13,14,15,16,20,21,22,24, 25,26,27,28,29,30,32 with alveoloplasty. (N/A)  Subjective: Patient has sore mouth (from dental surgery). He believes he would like to go home today.  Objective: Vital signs in last 24 hours: Temp:  [98 F (36.7 C)-98.6 F (37 C)] 98.6 F (37 C) (01/17 0543) Pulse Rate:  [88-92] 92 (01/17 0543) Cardiac Rhythm:  [-] Normal sinus rhythm (01/16 2035) Resp:  [18] 18 (01/17 0543) BP: (99-121)/(50-63) 111/54 mmHg (01/17 0543) SpO2:  [95 %-98 %] 95 % (01/17 0543) Weight:  [303 lb (137.44 kg)] 303 lb (137.44 kg) (01/17 0500)  Pre op weight 150 kg Current Weight  10/26/14 303 lb (137.44 kg)      Intake/Output from previous day: 01/16 0701 - 01/17 0700 In: -  Out: 300 [Urine:300]   Physical Exam:  Cardiovascular: RRR Pulmonary: Clear to auscultation bilaterally; no rales, wheezes, or rhonchi. Abdomen: Soft, non tender, bowel sounds present. Extremities: Mild bilateral lower extremity edema. Wounds: Clean and dry.  No erythema or signs of infection.  Lab Results: CBC:No results for input(s): WBC, HGB, HCT, PLT in the last 72 hours. BMET: No results for input(s): NA, K, CL, CO2, GLUCOSE, BUN, CREATININE, CALCIUM in the last 72 hours.  PT/INR:  Lab Results  Component Value Date   INR 1.40 10/13/2014   INR 1.05 10/12/2014   INR 1.01 10/11/2014   ABG:  INR: Will add last result for INR, ABG once components are confirmed Will add last 4 CBG results once components are confirmed  Assessment/Plan:  1. CV - Maintaining SR in the 90's. On Amiodarone 200 daily, Toprol XL 100 daily, Lisinopril 20 daily. QT 480. As discussed with Dr. Donata ClayVan Trigt, stop Amiodarone. 2.  Pulmonary - On room air.Encourage incentive spirometer 3.  Acute blood loss anemia - Last H and H 8.9 and  27.9 4.DM-CBGs 127/151/155. Continue Insulin. Pre op HGA1C  9.8. Will need follow up with medical doctor after discharge 5. Remove sutures 6. Possibly discharge today  ZIMMERMAN,DONIELLE MPA-C 10/26/2014,7:40 AM

## 2014-10-26 NOTE — Progress Notes (Signed)
Patient walked with family member for a total of 700 feet. Patient in no distress and tolerating well. Patient states he felt tired.   VSS.   Will continue to monitor.   Edgardo RoysMcGrath, Zoeya Gramajo R

## 2014-10-27 ENCOUNTER — Encounter (HOSPITAL_COMMUNITY): Payer: Self-pay | Admitting: Dentistry

## 2014-10-27 LAB — CULTURE, BLOOD (ROUTINE X 2)
CULTURE: NO GROWTH
Culture: NO GROWTH

## 2014-10-28 ENCOUNTER — Ambulatory Visit: Payer: Self-pay | Admitting: Family

## 2014-11-03 ENCOUNTER — Telehealth: Payer: Self-pay | Admitting: Family

## 2014-11-04 NOTE — Telephone Encounter (Signed)
Appointment rescheduled for 2/11 with Christy.

## 2014-11-10 ENCOUNTER — Encounter: Payer: Self-pay | Admitting: Cardiology

## 2014-11-10 ENCOUNTER — Ambulatory Visit (INDEPENDENT_AMBULATORY_CARE_PROVIDER_SITE_OTHER): Payer: 59 | Admitting: Cardiology

## 2014-11-10 VITALS — BP 146/86 | HR 82 | Ht 71.0 in | Wt 324.0 lb

## 2014-11-10 DIAGNOSIS — I10 Essential (primary) hypertension: Secondary | ICD-10-CM

## 2014-11-10 MED ORDER — METOPROLOL SUCCINATE ER 100 MG PO TB24: ORAL_TABLET | ORAL | Status: DC

## 2014-11-10 NOTE — Progress Notes (Signed)
Clinical Summary [name] is a 44 y.o.male seen today for hospital follow up. This is our first visit together.  1. CAD - hx of prior anterior STEMI in April 2014 at [facility], received stent to LAD - admit Jan 2016 with MI, cath severe CAD as described below with LVEF by LV gram 25% referred for CABG - TEE LVEF 25-30% - s/p CABG Jan 2016 (LIMA-LAD, SVG-diag, SVG-PDA,SVG-OM) - post CABG episodes of VT treated with lidocaine and amio, of note was on milrinone and pressors as well at the time. - amio stopped due to QTc of 480 according to notes  - reports some discomfort at surgical site, no other chest pain. Denies any SOB.  - compliant with meds  2. HTN - does not check at home - compliant with meds   3. OSA screen - +snoring, no apneic episodes, no somnolence.    Past Medical History  Diagnosis Date  . Coronary artery disease 01/2013    anterior MI with cath showing 95% pLAD, 90% diag, 30% OM1, 70% mRCA, 60% dRCA s/p PCI with DES to LAD and diag and EF 40%  . Ischemic dilated cardiomyopathy   . Hypertension   . Morbid obesity   . Acute on chronic combined systolic and diastolic heart failure   . Visual field defect 10/12/2014  . Diabetes mellitus type 2, uncontrolled, with complications   . Stroke 10/12/2014    Patient with several month h/o left sided visual field deficit and MRI of brain revealing:  1. Medial right occipital lobe subacute infarct. 2. At least 3 punctate areas of acute nonhemorrhagic infarct are present involving the left thalamus, high posterior right frontal lobe and high a medial posterior left frontal or parietal lobe. 3. Additional white matter changes are noted beyond the areas of  . 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 Nitroglycerin Other (See Comments)    Hypotension, syncope, bradycardia     Current Outpatient Prescriptions  Medication Sig  Dispense Refill  . amoxicillin (AMOXIL) 500 MG capsule Take 1 capsule (500 mg total) by mouth every 8 (eight) hours. 7 capsule 0  . aspirin EC 325 MG EC tablet Take 1 tablet (325 mg total) by mouth daily. 30 tablet 0  . atorvastatin (LIPITOR) 80 MG tablet Take 1 tablet (80 mg total) by mouth daily at 6 PM. 30 tablet 1  . citalopram (CELEXA) 40 MG tablet Take 1 tablet (40 mg total) by mouth at bedtime. 30 tablet 1  . insulin detemir (LEVEMIR) 100 UNIT/ML injection Inject 0.5 mLs (50 Units total) into the skin daily. 10 mL 11  . insulin starter kit- pen needles MISC 1 kit by Other route once. 1 kit 10  . insulin starter kit- syringes MISC 1 kit by Other route once. 1 kit 10  . lisinopril (PRINIVIL,ZESTRIL) 20 MG tablet Take 1 tablet (20 mg total) by mouth daily. 30 tablet 1  . metFORMIN (GLUCOPHAGE) 500 MG tablet Take 500 mg by mouth 2 (two) times daily with a meal.    . metoprolol succinate (TOPROL-XL) 100 MG 24 hr tablet Take 1 tablet (100 mg total) by mouth daily. Take with or immediately following a meal. 30 tablet 1  . Multiple Vitamin (MULTIVITAMIN WITH MINERALS) TABS tablet Take 1 tablet by mouth daily.    Marland Kitchen oxyCODONE (OXY IR/ROXICODONE)  5 MG immediate release tablet Take 1-2 tablets (5-10 mg total) by mouth every 4 (four) hours as needed for severe pain. 30 tablet 0   No current facility-administered medications for this visit.     Past Surgical History  Procedure Laterality Date  . Cardiac catheterization  01/2013    [facility] in [place]  . Coronary angioplasty  01/2013    PCI with stenting of LAD  . Cholecystectomy    . Left heart cath N/A 10/10/2014    Procedure: LEFT HEART CATH;  Surgeon: Troy Sine, MD;  Location: [facility] CATH LAB;  Service: Cardiovascular;  Laterality: N/A;  . Coronary artery bypass graft N/A 10/13/2014    Procedure: CORONARY ARTERY BYPASS GRAFTING (CABG), ON PUMP, TIMES FOUR, USING LEFT INTERNAL MAMMARY ARTERY, BILATERAL GREATER SAPHENOUS VEINS HARVESTED  ENDOSCOPICALLY;  Surgeon: Rexene Alberts, MD;  Location: [facility];  Service: Open Heart Surgery;  Laterality: N/A;  . Multiple extractions with alveoloplasty N/A 10/22/2014    Procedure: Extraction of tooth #'s 1,3,4,5,6,7,8,9,10,11,12,13,14,15,16,20,21,22,24, 25,26,27,28,29,30,32 with alveoloplasty.;  Surgeon: Lenn Cal, DDS;  Location: [place];  Service: Oral Surgery;  Laterality: N/A;     Allergies  Allergen Reactions  . Nitroglycerin Other (See Comments)    Hypotension, syncope, bradycardia      Family History  Problem Relation Age of Onset  . Heart attack Brother 60     Social History [name] reports that he quit smoking about 7 years ago. He does not have any smokeless tobacco history on file. [name] reports that he does not drink alcohol.   Review of Systems CONSTITUTIONAL: No weight loss, fever, chills, weakness or fatigue.  HEENT: Eyes: No visual loss, blurred vision, double vision or yellow sclerae.No hearing loss, sneezing, congestion, runny nose or sore throat.  SKIN: No rash or itching.  CARDIOVASCULAR: per HPI RESPIRATORY: No shortness of breath, cough or sputum.  GASTROINTESTINAL: No anorexia, nausea, vomiting or diarrhea. No abdominal pain or blood.  GENITOURINARY: No burning on urination, no polyuria NEUROLOGICAL: No headache, dizziness, syncope, paralysis, ataxia, numbness or tingling in the extremities. No change in bowel or bladder control.  MUSCULOSKELETAL: No muscle, back pain, joint pain or stiffness.  LYMPHATICS: No enlarged nodes. No history of splenectomy.  PSYCHIATRIC: No history of depression or anxiety.  ENDOCRINOLOGIC: No reports of sweating, cold or heat intolerance. No polyuria or polydipsia.  Marland Kitchen   Physical Examination p 82 bp 146/86 Wt 324 lbs BMI 45 Gen: resting comfortably, no acute distress HEENT: no scleral icterus, pupils equal round and reactive, no palptable cervical adenopathy,  CV: RRR, no m/r/g, no JVD Resp: Clear to  auscultation bilaterally GI: abdomen is soft, non-tender, non-distended, normal bowel sounds, no hepatosplenomegaly MSK: extremities are warm, 1+ bilateral edema Skin: warm, no rash Neuro:  no focal deficits Psych: appropriate affect   Diagnostic Studies 10/2014 Cath ANGIOGRAPHY:  Left main: Moderate size vessel which trifurcated into an LAD and intermediate vessel and left circumflex coronary artery.  LAD: The LAD was subtotally occluded at its ostium and there was diffuse 95-99% ostial proximal stenosis and then was totally occluded in the region of the first diagonal Mohamed Portlock. There was a gap and then faint filling of a second diagonal Layne Lebon which had a stent and there was faint filling of the LAD beyond the diagonal vessel via collaterals.  [place] Intermediate: Small caliber vessel free of significant disease.  Left circumflex: Large caliber vessel that gave rise to 2 major marginal branches and then in the posterior  lateral like coronary artery. The first marginal Ritik Stavola was moderate size and had proximal 90% followed by 80% stenoses. A distal superior Takeria Marquina had 95% stenosis.  Right coronary artery: Moderate size vessel that had 95% mid stenosis and 80% distal stenosis in the region of the acute margin. The vessel supplied the PDA. There were septal collaterals to the LAD.   Left ventriculography revealed dilated ventricle with an ejection fraction of 25% with diffuse global hypokinesis   IMPRESSION:  Severe ischemic dilated cardiomyopathy with an ejection fraction approximately 25%.  Severe multivessel coronary obstructive disease with diffusely diseased subtotal occlusion of the ostial and proximal LAD with faint collaterals to the first and second diagonal vessel and more distal LAD; left circumflex coronary artery with tandem 90 and 80% obtuse marginal 1 stenosis with distal 95% stenosis in this distal superior Lyvia Mondesir of this marginal vessel; and 95% mid RCA stenosis with  80% stenosis in the region of the acute margin with evidence for septal collaterals from the PDA to the LAD.  RECOMMENDATION:  The patient has surgical anatomy and CABG revascularization surgery will be recommended with surgical consultation in the morning. The patient was started on Aggrastat post procedure to reduce likelihood of progressive thrombosis. An attempt was made to initiate low-dose IV nitroglycerin, but the patient transiently dropped his blood pressure and this was discontinued.  10/2014 TEE Study Conclusions  - Left ventricle: Septal apical and anterior wall hypokinesis The cavity size was severely dilated. Systolic function was severely reduced. The estimated ejection fraction was in the range of 25% to 30%. - Aortic valve: There was trivial regurgitation. - Left atrium: The atrium was mildly dilated. - Atrial septum: No defect or patent foramen ovale was identified. - Pericardium, extracardiac: Small pericardial effusion IVC flat no evidence of tamponade.     Assessment and Plan  1. CAD - recent MI, found to have severe multivessel disease, s/p 4 vessel CABG. LVEF 25-30% - denies any recent symptoms, tolerating medical therapy well - increase Toprol XL to $Rem'150mg'FVUy$  daily, f/u 2 weeks for further titration. Optimize beta blocker, then ACE titration and additon of aldactone - will need repeat echo in near future for ICD consideration  2. HTN - slightly elevated, follow with increased Toprol dose  3. OSA screen - some signs/symptoms of OSA, consider testing in near future.    F/u 2-3 weeks.     Arnoldo Lenis, M.D.

## 2014-11-10 NOTE — Patient Instructions (Signed)
Your physician recommends that you schedule a follow-up appointment in: 3 weeks with Dr. Wyline MoodBranch  Your physician has recommended you make the following change in your medication:   INCREASE TOPROL TO 150 MG DAILY  CONTINUE ALL OTHER MEDICATIONS AS DIRECTED  Thank you for choosing [facility]!!

## 2014-11-18 ENCOUNTER — Other Ambulatory Visit: Payer: Self-pay | Admitting: Thoracic Surgery (Cardiothoracic Vascular Surgery)

## 2014-11-18 DIAGNOSIS — I255 Ischemic cardiomyopathy: Secondary | ICD-10-CM

## 2014-11-18 DIAGNOSIS — I420 Dilated cardiomyopathy: Principal | ICD-10-CM

## 2014-11-20 ENCOUNTER — Ambulatory Visit (INDEPENDENT_AMBULATORY_CARE_PROVIDER_SITE_OTHER): Payer: 59 | Admitting: Family

## 2014-11-20 ENCOUNTER — Encounter: Payer: Self-pay | Admitting: Family

## 2014-11-20 VITALS — BP 157/95 | HR 102 | Temp 97.9°F | Ht 71.0 in | Wt 330.0 lb

## 2014-11-20 DIAGNOSIS — F4323 Adjustment disorder with mixed anxiety and depressed mood: Secondary | ICD-10-CM

## 2014-11-20 DIAGNOSIS — E785 Hyperlipidemia, unspecified: Secondary | ICD-10-CM

## 2014-11-20 DIAGNOSIS — IMO0002 Reserved for concepts with insufficient information to code with codable children: Secondary | ICD-10-CM

## 2014-11-20 DIAGNOSIS — I10 Essential (primary) hypertension: Secondary | ICD-10-CM

## 2014-11-20 DIAGNOSIS — Z125 Encounter for screening for malignant neoplasm of prostate: Secondary | ICD-10-CM

## 2014-11-20 DIAGNOSIS — Z1321 Encounter for screening for nutritional disorder: Secondary | ICD-10-CM | POA: Diagnosis not present

## 2014-11-20 DIAGNOSIS — E118 Type 2 diabetes mellitus with unspecified complications: Secondary | ICD-10-CM

## 2014-11-20 DIAGNOSIS — E1165 Type 2 diabetes mellitus with hyperglycemia: Secondary | ICD-10-CM

## 2014-11-20 DIAGNOSIS — E1169 Type 2 diabetes mellitus with other specified complication: Secondary | ICD-10-CM | POA: Insufficient documentation

## 2014-11-20 LAB — POCT GLYCOSYLATED HEMOGLOBIN (HGB A1C): Hemoglobin A1C: 6.5

## 2014-11-20 LAB — POCT UA - MICROALBUMIN: Microalbumin Ur, POC: 20 mg/L

## 2014-11-20 MED ORDER — LISINOPRIL 40 MG PO TABS: 40.0000 mg | ORAL_TABLET | Freq: Every day | ORAL | Status: DC

## 2014-11-20 NOTE — Patient Instructions (Addendum)
Health Maintenance A healthy lifestyle and preventative care can promote health and wellness.  Maintain regular health, dental, and eye exams.  Eat a healthy diet. Foods like vegetables, fruits, whole grains, low-fat dairy products, and lean protein foods contain the nutrients you need and are low in calories. Decrease your intake of foods high in solid fats, added sugars, and salt. Get information about a proper diet from your health care provider, if necessary.  Regular physical exercise is one of the most important things you can do for your health. Most adults should get at least 150 minutes of moderate-intensity exercise (any activity that increases your heart rate and causes you to sweat) each week. In addition, most adults need muscle-strengthening exercises on 2 or more days a week.   Maintain a healthy weight. The body mass index (BMI) is a screening tool to identify possible weight problems. It provides an estimate of body fat based on height and weight. Your health care provider can find your BMI and can help you achieve or maintain a healthy weight. For males 20 years and older:  A BMI below 18.5 is considered underweight.  A BMI of 18.5 to 24.9 is normal.  A BMI of 25 to 29.9 is considered overweight.  A BMI of 30 and above is considered obese.  Maintain normal blood lipids and cholesterol by exercising and minimizing your intake of saturated fat. Eat a balanced diet with plenty of fruits and vegetables. Blood tests for lipids and cholesterol should begin at age 20 and be repeated every 5 years. If your lipid or cholesterol levels are high, you are over age 50, or you are at high risk for heart disease, you may need your cholesterol levels checked more frequently.Ongoing high lipid and cholesterol levels should be treated with medicines if diet and exercise are not working.  If you smoke, find out from your health care provider how to quit. If you do not use tobacco, do not  start.  Lung cancer screening is recommended for adults aged 55-80 years who are at high risk for developing lung cancer because of a history of smoking. A yearly low-dose CT scan of the lungs is recommended for people who have at least a 30-pack-year history of smoking and are current smokers or have quit within the past 15 years. A pack year of smoking is smoking an average of 1 pack of cigarettes a day for 1 year (for example, a 30-pack-year history of smoking could mean smoking 1 pack a day for 30 years or 2 packs a day for 15 years). Yearly screening should continue until the smoker has stopped smoking for at least 15 years. Yearly screening should be stopped for people who develop a health problem that would prevent them from having lung cancer treatment.  If you choose to drink alcohol, do not have more than 2 drinks per day. One drink is considered to be 12 oz (360 mL) of beer, 5 oz (150 mL) of wine, or 1.5 oz (45 mL) of liquor.  Avoid the use of street drugs. Do not share needles with anyone. Ask for help if you need support or instructions about stopping the use of drugs.  High blood pressure causes heart disease and increases the risk of stroke. Blood pressure should be checked at least every 1-2 years. Ongoing high blood pressure should be treated with medicines if weight loss and exercise are not effective.  If you are 45-79 years old, ask your health care provider if   you should take aspirin to prevent heart disease.  Diabetes screening involves taking a blood sample to check your fasting blood sugar level. This should be done once every 3 years after age 45 if you are at a normal weight and without risk factors for diabetes. Testing should be considered at a younger age or be carried out more frequently if you are overweight and have at least 1 risk factor for diabetes.  Colorectal cancer can be detected and often prevented. Most routine colorectal cancer screening begins at the age of 50  and continues through age 75. However, your health care provider may recommend screening at an earlier age if you have risk factors for colon cancer. On a yearly basis, your health care provider may provide home test kits to check for hidden blood in the stool. A small camera at the end of a tube may be used to directly examine the colon (sigmoidoscopy or colonoscopy) to detect the earliest forms of colorectal cancer. Talk to your health care provider about this at age 50 when routine screening begins. A direct exam of the colon should be repeated every 5-10 years through age 75, unless early forms of precancerous polyps or small growths are found.  People who are at an increased risk for hepatitis B should be screened for this virus. You are considered at high risk for hepatitis B if:  You were born in a country where hepatitis B occurs often. Talk with your health care provider about which countries are considered high risk.  Your parents were born in a high-risk country and you have not received a shot to protect against hepatitis B (hepatitis B vaccine).  You have HIV or AIDS.  You use needles to inject street drugs.  You live with, or have sex with, someone who has hepatitis B.  You are a man who has sex with other men (MSM).  You get hemodialysis treatment.  You take certain medicines for conditions like cancer, organ transplantation, and autoimmune conditions.  Hepatitis C blood testing is recommended for all people born from 1945 through 1965 and any individual with known risk factors for hepatitis C.  Healthy men should no longer receive prostate-specific antigen (PSA) blood tests as part of routine cancer screening. Talk to your health care provider about prostate cancer screening.  Testicular cancer screening is not recommended for adolescents or adult males who have no symptoms. Screening includes self-exam, a health care provider exam, and other screening tests. Consult with your  health care provider about any symptoms you have or any concerns you have about testicular cancer.  Practice safe sex. Use condoms and avoid high-risk sexual practices to reduce the spread of sexually transmitted infections (STIs).  You should be screened for STIs, including gonorrhea and chlamydia if:  You are sexually active and are younger than 24 years.  You are older than 24 years, and your health care provider tells you that you are at risk for this type of infection.  Your sexual activity has changed since you were last screened, and you are at an increased risk for chlamydia or gonorrhea. Ask your health care provider if you are at risk.  If you are at risk of being infected with HIV, it is recommended that you take a prescription medicine daily to prevent HIV infection. This is called pre-exposure prophylaxis (PrEP). You are considered at risk if:  You are a man who has sex with other men (MSM).  You are a heterosexual man who   is sexually active with multiple partners.  You take drugs by injection.  You are sexually active with a partner who has HIV.  Talk with your health care provider about whether you are at high risk of being infected with HIV. If you choose to begin PrEP, you should first be tested for HIV. You should then be tested every 3 months for as long as you are taking PrEP.  Use sunscreen. Apply sunscreen liberally and repeatedly throughout the day. You should seek shade when your shadow is shorter than you. Protect yourself by wearing long sleeves, pants, a wide-brimmed hat, and sunglasses year round whenever you are outdoors.  Tell your health care provider of new moles or changes in moles, especially if there is a change in shape or color. Also, tell your health care provider if a mole is larger than the size of a pencil eraser.  A one-time screening for abdominal aortic aneurysm (AAA) and surgical repair of large AAAs by ultrasound is recommended for men aged  65-75 years who are current or former smokers.  Stay current with your vaccines (immunizations). Document Released: 03/24/2008 Document Revised: 10/01/2013 Document Reviewed: 02/21/2011 Exit[employer] Patient Information 2015 [employer]. This information is not intended to replace advice given to you by your health care provider. Make sure you discuss any questions you have with your health care provider. DASH Eating Plan DASH stands for "Dietary Approaches to Stop Hypertension." The DASH eating plan is a healthy eating plan that has been shown to reduce high blood pressure (hypertension). Additional health benefits may include reducing the risk of type 2 diabetes mellitus, heart disease, and stroke. The DASH eating plan may also help with weight loss. WHAT DO I NEED TO KNOW ABOUT THE DASH EATING PLAN? For the DASH eating plan, you will follow these general guidelines:  Choose foods with a percent daily value for sodium of less than 5% (as listed on the food label).  Use salt-free seasonings or herbs instead of table salt or sea salt.  Check with your health care provider or pharmacist before using salt substitutes.  Eat lower-sodium products, often labeled as "lower sodium" or "no salt added."  Eat fresh foods.  Eat more vegetables, fruits, and low-fat dairy products.  Choose whole grains. Look for the word "whole" as the first word in the ingredient list.  Choose fish and skinless chicken or turkey more often than red meat. Limit fish, poultry, and meat to 6 oz (170 g) each day.  Limit sweets, desserts, sugars, and sugary drinks.  Choose heart-healthy fats.  Limit cheese to 1 oz (28 g) per day.  Eat more home-cooked food and less restaurant, buffet, and fast food.  Limit fried foods.  Cook foods using methods other than frying.  Limit canned vegetables. If you do use them, rinse them well to decrease the sodium.  When eating at a restaurant, ask that your food be prepared  with less salt, or no salt if possible. WHAT FOODS CAN I EAT? Seek help from a dietitian for individual calorie needs. Grains Whole grain or whole wheat bread. Brown rice. Whole grain or whole wheat pasta. Quinoa, bulgur, and whole grain cereals. Low-sodium cereals. Corn or whole wheat flour tortillas. Whole grain cornbread. Whole grain crackers. Low-sodium crackers. Vegetables Fresh or frozen vegetables (raw, steamed, roasted, or grilled). Low-sodium or reduced-sodium tomato and vegetable juices. Low-sodium or reduced-sodium tomato sauce and paste. Low-sodium or reduced-sodium canned vegetables.  Fruits All fresh, canned (in natural juice), or frozen   fruits. Meat and Other Protein Products Ground beef (85% or leaner), grass-fed beef, or beef trimmed of fat. Skinless chicken or turkey. Ground chicken or turkey. Pork trimmed of fat. All fish and seafood. Eggs. Dried beans, peas, or lentils. Unsalted nuts and seeds. Unsalted canned beans. Dairy Low-fat dairy products, such as skim or 1% milk, 2% or reduced-fat cheeses, low-fat ricotta or cottage cheese, or plain low-fat yogurt. Low-sodium or reduced-sodium cheeses. Fats and Oils Tub margarines without trans fats. Light or reduced-fat mayonnaise and salad dressings (reduced sodium). Avocado. Safflower, olive, or canola oils. Natural peanut or almond butter. Other Unsalted popcorn and pretzels. The items listed above may not be a complete list of recommended foods or beverages. Contact your dietitian for more options. WHAT FOODS ARE NOT RECOMMENDED? Grains White bread. White pasta. White rice. Refined cornbread. Bagels and croissants. Crackers that contain trans fat. Vegetables Creamed or fried vegetables. Vegetables in a cheese sauce. Regular canned vegetables. Regular canned tomato sauce and paste. Regular tomato and vegetable juices. Fruits Dried fruits. Canned fruit in light or heavy syrup. Fruit juice. Meat and Other Protein Products Fatty  cuts of meat. Ribs, chicken wings, bacon, sausage, bologna, salami, chitterlings, fatback, hot dogs, bratwurst, and packaged luncheon meats. Salted nuts and seeds. Canned beans with salt. Dairy Whole or 2% milk, cream, half-and-half, and cream cheese. Whole-fat or sweetened yogurt. Full-fat cheeses or blue cheese. Nondairy creamers and whipped toppings. Processed cheese, cheese spreads, or cheese curds. Condiments Onion and garlic salt, seasoned salt, table salt, and sea salt. Canned and packaged gravies. Worcestershire sauce. Tartar sauce. Barbecue sauce. Teriyaki sauce. Soy sauce, including reduced sodium. Steak sauce. Fish sauce. Oyster sauce. Cocktail sauce. Horseradish. Ketchup and mustard. Meat flavorings and tenderizers. Bouillon cubes. Hot sauce. Tabasco sauce. Marinades. Taco seasonings. Relishes. Fats and Oils Butter, stick margarine, lard, shortening, ghee, and bacon fat. Coconut, palm kernel, or palm oils. Regular salad dressings. Other Pickles and olives. Salted popcorn and pretzels. The items listed above may not be a complete list of foods and beverages to avoid. Contact your dietitian for more information. WHERE CAN I FIND MORE INFORMATION? National Heart, [employer], [employer]: www.nhlbi.nih.gov/health/health-topics/topics/dash/ Document Released: 09/15/2011 Document Revised: 02/10/2014 Document Reviewed: 07/31/2013 ExitCare Patient Information 2015 [employer]. This information is not intended to replace advice given to you by your health care provider. Make sure you discuss any questions you have with your health care provider. Hypertension Hypertension, commonly called high blood pressure, is when the force of blood pumping through your arteries is too strong. Your arteries are the blood vessels that carry blood from your heart throughout your body. A blood pressure reading consists of a higher number over a lower number, such as 110/72. The higher number (systolic) is the  pressure inside your arteries when your heart pumps. The lower number (diastolic) is the pressure inside your arteries when your heart relaxes. Ideally you want your blood pressure below 120/80. Hypertension forces your heart to work harder to pump blood. Your arteries may become narrow or stiff. Having hypertension puts you at risk for heart disease, stroke, and other problems.  RISK FACTORS Some risk factors for high blood pressure are controllable. Others are not.  Risk factors you cannot control include:   Race. You may be at higher risk if you are African American.  Age. Risk increases with age.  Gender. Men are at higher risk than women before age 45 years. After age 65, women are at higher risk than men. Risk factors you   can control include:  Not getting enough exercise or physical activity.  Being overweight.  Getting too much fat, sugar, calories, or salt in your diet.  Drinking too much alcohol. SIGNS AND SYMPTOMS Hypertension does not usually cause signs or symptoms. Extremely high blood pressure (hypertensive crisis) may cause headache, anxiety, shortness of breath, and nosebleed. DIAGNOSIS  To check if you have hypertension, your health care provider will measure your blood pressure while you are seated, with your arm held at the level of your heart. It should be measured at least twice using the same arm. Certain conditions can cause a difference in blood pressure between your right and left arms. A blood pressure reading that is higher than normal on one occasion does not mean that you need treatment. If one blood pressure reading is high, ask your health care provider about having it checked again. TREATMENT  Treating high blood pressure includes making lifestyle changes and possibly taking medicine. Living a healthy lifestyle can help lower high blood pressure. You may need to change some of your habits. Lifestyle changes may include:  Following the DASH diet. This diet  is high in fruits, vegetables, and whole grains. It is low in salt, red meat, and added sugars.  Getting at least 2 hours of brisk physical activity every week.  Losing weight if necessary.  Not smoking.  Limiting alcoholic beverages.  Learning ways to reduce stress. If lifestyle changes are not enough to get your blood pressure under control, your health care provider may prescribe medicine. You may need to take more than one. Work closely with your health care provider to understand the risks and benefits. HOME CARE INSTRUCTIONS  Have your blood pressure rechecked as directed by your health care provider.   Take medicines only as directed by your health care provider. Follow the directions carefully. Blood pressure medicines must be taken as prescribed. The medicine does not work as well when you skip doses. Skipping doses also puts you at risk for problems.   Do not smoke.   Monitor your blood pressure at home as directed by your health care provider. SEEK MEDICAL CARE IF:   You think you are having a reaction to medicines taken.  You have recurrent headaches or feel dizzy.  You have swelling in your ankles.  You have trouble with your vision. SEEK IMMEDIATE MEDICAL CARE IF:  You develop a severe headache or confusion.  You have unusual weakness, numbness, or feel faint.  You have severe chest or abdominal pain.  You vomit repeatedly.  You have trouble breathing. MAKE SURE YOU:   Understand these instructions.  Will watch your condition.  Will get help right away if you are not doing well or get worse. Document Released: 09/26/2005 Document Revised: 02/10/2014 Document Reviewed: 07/19/2013 ExitCare Patient Information 2015 [employer]. This information is not intended to replace advice given to you by your health care provider. Make sure you discuss any questions you have with your health care provider.  

## 2014-11-20 NOTE — Progress Notes (Addendum)
 Subjective:    Patient ID: Mark Leach, male    DOB: 09/02/1971, 43 y.o.   MRN: [id number]  Hypertension This is a chronic problem. The current episode started more than 1 year ago. The problem has been waxing and waning since onset. The problem is uncontrolled. Associated symptoms include anxiety, peripheral edema and shortness of breath. Pertinent negatives include no blurred vision, headaches or palpitations. Risk factors for coronary artery disease include diabetes mellitus, dyslipidemia, male gender and obesity. Past treatments include ACE inhibitors and beta blockers. The current treatment provides mild improvement. Hypertensive end-organ damage includes CAD/MI, CVA and heart failure. There is no history of kidney disease or a thyroid problem. There is no history of sleep apnea.  Hyperlipidemia This is a chronic problem. The current episode started more than 1 year ago. The problem is uncontrolled. Recent lipid tests were reviewed and are high. Exacerbating diseases include diabetes and obesity. He has no history of hypothyroidism. Associated symptoms include shortness of breath. Pertinent negatives include no leg pain or myalgias. Current antihyperlipidemic treatment includes statins. The current treatment provides significant improvement of lipids. Risk factors for coronary artery disease include diabetes mellitus, dyslipidemia, family history, hypertension, male sex, obesity and a sedentary lifestyle.  Diabetes He presents for his follow-up diabetic visit. He has type 2 diabetes mellitus. His disease course has been worsening. Hypoglycemia symptoms include nervousness/anxiousness. Pertinent negatives for hypoglycemia include no confusion, headaches or mood changes. Pertinent negatives for diabetes include no blurred vision, no foot paresthesias, no foot ulcerations and no visual change. Pertinent negatives for hypoglycemia complications include no blackouts and no hospitalization. Symptoms are  worsening. Diabetic complications include a CVA. Risk factors for coronary artery disease include diabetes mellitus, dyslipidemia, family history, hypertension, male sex, obesity and sedentary lifestyle. Current diabetic treatment includes insulin injections. He is compliant with treatment all of the time. His weight is stable. He is following a generally unhealthy diet. His breakfast blood glucose range is generally 110-130 mg/dl. An ACE inhibitor/angiotensin II receptor blocker is being taken. Eye exam is not current.  Anxiety Presents for follow-up visit. Symptoms include depressed mood, excessive worry, nervous/anxious behavior and shortness of breath. Patient reports no confusion, insomnia, irritability, palpitations or panic. Symptoms occur occasionally. The quality of sleep is good.   His past medical history is significant for anxiety/panic attacks and depression. There is no history of CAD. Past treatments include SSRIs. Compliance with prior treatments has been good.      Review of Systems  Constitutional: Negative.  Negative for irritability.  HENT: Negative.   Eyes: Negative for blurred vision.  Respiratory: Positive for shortness of breath.   Cardiovascular: Negative.  Negative for palpitations.  Gastrointestinal: Negative.   Endocrine: Negative.   Genitourinary: Negative.   Musculoskeletal: Negative.  Negative for myalgias.  Neurological: Negative.  Negative for headaches.  Hematological: Negative.   Psychiatric/Behavioral: Negative for confusion. The patient is nervous/anxious. The patient does not have insomnia.   All other systems reviewed and are negative.      Objective:   Physical Exam  Constitutional: He is oriented to person, place, and time. He appears well-developed and well-nourished. No distress.  HENT:  Head: Normocephalic.  Right Ear: External ear normal.  Left Ear: External ear normal.  Nose: Nose normal.  Mouth/Throat: Oropharynx is clear and moist.    Eyes: Pupils are equal, round, and reactive to light. Right eye exhibits no discharge. Left eye exhibits no discharge.  Neck: Normal range of motion. Neck supple. No   thyromegaly present.  Cardiovascular: Normal rate, regular rhythm, normal heart sounds and intact distal pulses.   No murmur heard. Pulmonary/Chest: Effort normal and breath sounds normal. No respiratory distress. He has no wheezes.  Abdominal: Soft. Bowel sounds are normal. He exhibits no distension. There is no tenderness.  Musculoskeletal: Normal range of motion. He exhibits edema (3+ in BLE). He exhibits no tenderness.  Neurological: He is alert and oriented to person, place, and time. He has normal reflexes. No cranial nerve deficit.  Skin: Skin is warm and dry. No rash noted. No erythema.  Psychiatric: He has a normal mood and affect. His behavior is normal. Judgment and thought content normal.  Vitals reviewed.     BP 157/95 mmHg  Pulse 102  Temp(Src) 97.9 F (36.6 C) (Oral)  Ht 5' 11" (1.803 m)  Wt 330 lb (149.687 kg)  BMI 46.05 kg/m2     Assessment & Plan:  1. Essential hypertension -Pt increased lisinopril 85m to 40 mg today -Dash diet information given -Exercise encouraged - Stress Management  -Continue current meds -RTO in w2 weeks -  CMP14+EGFR - lisinopril (PRINIVIL,ZESTRIL) 40 MG tablet; Take 1 tablet (40 mg total) by mouth daily.  Dispense: 90 tablet; Refill: 3  2. Diabetes mellitus type 2, uncontrolled, with complications - POCT glycosylated hemoglobin (Hb A1C) - POCT UA - Microalbumin - CMP14+EGFR  3. Morbid obesity - CMP14+EGFR  4. Adjustment disorder with mixed anxiety and depressed mood - CMP14+EGFR  5. Hyperlipidemia - CMP14+EGFR  6. Encounter for vitamin deficiency screening - Vit D  25 hydroxy (rtn osteoporosis monitoring)  7. Prostate cancer screening - PSA, total and free   Continue all meds Labs pending Health Maintenance reviewed Diet and exercise encouraged RTO 2  weeks  CEvelina Dun FNP

## 2014-11-20 NOTE — Addendum Note (Signed)
Addended by: Jannifer RodneyHAWKS, Khalis Hittle A on: 11/20/2014 04:47 PM   Modules accepted: Level of Service

## 2014-11-20 NOTE — Addendum Note (Signed)
Addended by: Prescott GumLAND, Brooklen Runquist M on: 11/20/2014 04:36 PM   Modules accepted: Orders

## 2014-11-21 ENCOUNTER — Other Ambulatory Visit: Payer: Self-pay | Admitting: Family

## 2014-11-21 DIAGNOSIS — E559 Vitamin D deficiency, unspecified: Secondary | ICD-10-CM | POA: Insufficient documentation

## 2014-11-21 LAB — CMP14+EGFR
A/G RATIO: 1.2 (ref 1.1–2.5)
ALT: 40 IU/L (ref 0–44)
AST: 30 IU/L (ref 0–40)
Albumin: 3.8 g/dL (ref 3.5–5.5)
Alkaline Phosphatase: 211 IU/L — ABNORMAL HIGH (ref 39–117)
BILIRUBIN TOTAL: 0.4 mg/dL (ref 0.0–1.2)
BUN/Creatinine Ratio: 12 (ref 9–20)
BUN: 10 mg/dL (ref 6–24)
CO2: 25 mmol/L (ref 18–29)
CREATININE: 0.81 mg/dL (ref 0.76–1.27)
Calcium: 8.9 mg/dL (ref 8.7–10.2)
Chloride: 96 mmol/L — ABNORMAL LOW (ref 97–108)
GFR calc non Af Amer: 110 mL/min/{1.73_m2} (ref >59)
GFR, EST AFRICAN AMERICAN: 127 mL/min/{1.73_m2} (ref >59)
GLUCOSE: 280 mg/dL — AB (ref 65–99)
Globulin, Total: 3.2 g/dL (ref 1.5–4.5)
POTASSIUM: 4.4 mmol/L (ref 3.5–5.2)
SODIUM: 137 mmol/L (ref 134–144)
TOTAL PROTEIN: 7 g/dL (ref 6.0–8.5)

## 2014-11-21 LAB — PSA, TOTAL AND FREE
PSA, Free Pct: 30 %
PSA, Free: 0.12 ng/mL
PSA: 0.4 ng/mL (ref 0.0–4.0)

## 2014-11-21 LAB — MICROALBUMIN, URINE: MICROALBUM., U, RANDOM: 24.4 ug/mL — AB (ref 0.0–17.0)

## 2014-11-21 LAB — VITAMIN D 25 HYDROXY (VIT D DEFICIENCY, FRACTURES): Vit D, 25-Hydroxy: 10.8 ng/mL — ABNORMAL LOW (ref 30.0–100.0)

## 2014-11-21 MED ORDER — VITAMIN D (ERGOCALCIFEROL) 1.25 MG (50000 UNIT) PO CAPS: 50000.0000 [IU] | ORAL_CAPSULE | ORAL | Status: DC

## 2014-11-24 ENCOUNTER — Ambulatory Visit: Payer: 59 | Admitting: Thoracic Surgery (Cardiothoracic Vascular Surgery)

## 2014-11-28 ENCOUNTER — Other Ambulatory Visit: Payer: Self-pay | Admitting: Thoracic Surgery (Cardiothoracic Vascular Surgery)

## 2014-11-28 ENCOUNTER — Encounter: Payer: 59 | Attending: Thoracic Surgery (Cardiothoracic Vascular Surgery) | Admitting: Nutrition

## 2014-11-28 DIAGNOSIS — Z794 Long term (current) use of insulin: Secondary | ICD-10-CM | POA: Insufficient documentation

## 2014-11-28 DIAGNOSIS — Z6841 Body Mass Index (BMI) 40.0 and over, adult: Secondary | ICD-10-CM | POA: Insufficient documentation

## 2014-11-28 DIAGNOSIS — Z713 Dietary counseling and surveillance: Secondary | ICD-10-CM | POA: Insufficient documentation

## 2014-11-28 DIAGNOSIS — Z951 Presence of aortocoronary bypass graft: Secondary | ICD-10-CM

## 2014-11-28 DIAGNOSIS — E118 Type 2 diabetes mellitus with unspecified complications: Secondary | ICD-10-CM | POA: Insufficient documentation

## 2014-12-02 ENCOUNTER — Ambulatory Visit (INDEPENDENT_AMBULATORY_CARE_PROVIDER_SITE_OTHER): Payer: Self-pay | Admitting: Thoracic Surgery (Cardiothoracic Vascular Surgery)

## 2014-12-02 ENCOUNTER — Ambulatory Visit
Admission: RE | Admit: 2014-12-02 | Discharge: 2014-12-02 | Disposition: A | Discharge status: Home / Self Care | Payer: 59 | Source: Ambulatory Visit | Attending: Thoracic Surgery (Cardiothoracic Vascular Surgery) | Admitting: Thoracic Surgery (Cardiothoracic Vascular Surgery)

## 2014-12-02 ENCOUNTER — Encounter: Payer: Self-pay | Admitting: Thoracic Surgery (Cardiothoracic Vascular Surgery)

## 2014-12-02 VITALS — BP 169/90 | HR 86 | Resp 20 | Ht 71.0 in | Wt 330.0 lb

## 2014-12-02 DIAGNOSIS — Z951 Presence of aortocoronary bypass graft: Secondary | ICD-10-CM

## 2014-12-02 NOTE — Progress Notes (Signed)
[address]       [place] [address]             [phone]     CARDIOTHORACIC SURGERY OFFICE NOTE  Referring Provider is Sueanne Margarita, MD  Primary Cardiologist is Branch, Alphonse Guild, MD PCP is Sharion Balloon, FNP   HPI:  Patient returns to the office today for routine follow-up status post coronary artery bypass grafting 4 on 10/13/2014 for severe three-vessel coronary artery disease status post acute ST segment elevation myocardial infarction with ischemic cardiomyopathy and class for acute on chronic combined systolic and diastolic congestive heart failure.  Prior to surgery the patient was also diagnosed with likely remote history of previous stroke associated with visual field deficit which the patient states first developed in August 2015. MRI of the brain performed prior to surgery on 10/12/2014 confirmed the presence of a medial right occipital lobe infarct which was felt to be subacute. There were also at least 3 punctate areas of acute nonhemorrhagic infarct in the left thalamus, high posterior right frontal lobe, and high posterior left frontal lobe.  The patient's postoperative recovery was essentially uncomplicated.  Early during the postoperative period he had several brief nonsustained runs of wide complex tachycardia. At the time the patient was on inotropic agents, and he was treated with amiodarone. All arrhythmias resolved. The patient underwent dental extraction on 10/22/2014.  He slowly improved throughout the remainder of his hospital course, although he was evaluated by the psychiatry service because of severe mood and affect disorder. The patient was ultimately discharged from the hospital on 10/26/2014.  Since hospital discharge the patient has apparently done very well. He has been seen in follow-up by Dr. Harl Bowie on 11/10/2014 and more recently by [facility].  His medical therapy for hypertension and diabetes has been appropriately  adjusted. The patient returns for routine follow-up today. He states that he feels like he is doing well. In fact, the patient states that he feels better than he has in a long time. He has minimal residual soreness in his chest. The patient states that his chest hurts only on the left side when he takes a deep breath.  He is sleeping well at night. He states that he has no shortness of breath. He is walking a fair amount. His blood sugars have reportedly been under fairly good control. His biggest complaint is that of poor vision in his left visual field which was present preoperatively and corresponds with likely stroke he suffered several months ago.  He is scheduled to go back to see Evelina Dun next week for follow-up of hypertension.   Current Outpatient Prescriptions  Medication Sig Dispense Refill  . aspirin 81 MG tablet Take 81 mg by mouth daily.    Marland Kitchen atorvastatin (LIPITOR) 80 MG tablet Take 1 tablet (80 mg total) by mouth daily at 6 PM. 30 tablet 1  . citalopram (CELEXA) 40 MG tablet Take 1 tablet (40 mg total) by mouth at bedtime. 30 tablet 1  . insulin detemir (LEVEMIR) 100 UNIT/ML injection Inject 0.5 mLs (50 Units total) into the skin daily. 10 mL 11  . insulin starter kit- pen needles MISC 1 kit by Other route once. 1 kit 10  . insulin starter kit- syringes MISC 1 kit by Other route once. 1 kit 10  . lisinopril (PRINIVIL,ZESTRIL) 40 MG tablet Take 1 tablet (40 mg total) by mouth daily. 90 tablet 3  . metoprolol succinate (TOPROL-XL)  100 MG 24 hr tablet Take 150 mg daily. 30 tablet 1  . Misc Natural Products (BLACK CHERRY CONCENTRATE PO) Take 1 tablet by mouth as needed (gout flare).    Marland Kitchen oxyCODONE (OXY IR/ROXICODONE) 5 MG immediate release tablet Take 1-2 tablets (5-10 mg total) by mouth every 4 (four) hours as needed for severe pain. 30 tablet 0  . Vitamin D, Ergocalciferol, (DRISDOL) 50000 UNITS CAPS capsule Take 1 capsule (50,000 Units total) by mouth every 7 (seven) days. 12  capsule 3   No current facility-administered medications for this visit.      Physical Exam:   BP 169/90 mmHg  Pulse 86  Resp 20  Ht _0  (1.803 m)  Wt 330 lb (149.687 kg)  BMI 46.05 kg/m2  SpO2 97%  General:  Well-appearing  Chest:   Clear to auscultation  CV:   Regular rate and rhythm without murmur  Incisions:  Healing nicely, sternum is stable  Abdomen:  Soft and nontender  Extremities:  Warm and well-perfused, stable chronic lower extremity edema  Diagnostic Tests:  CHEST 2 VIEW  COMPARISON: October 21, 2014  FINDINGS: There is patchy bibasilar atelectatic change. There is no edema or consolidation. Heart is borderline enlarged with pulmonary vascularity within normal limits. Patient is status post coronary artery bypass grafting. No adenopathy. No bone lesions.  IMPRESSION: Patchy bibasilar atelectatic change. No frank edema or consolidation.   Electronically Signed  By: Lowella Grip III M.D.  On: 12/02/2014 16:24         Impression:  Patient is progressing remarkably well just over 6 weeks status post coronary artery bypass grafting. Despite his significant list of comorbid medical problems, he appears to be recovering uneventfully.  At some point he should have follow-up echocardiogram performed, and he may need to be considered a candidate for a defibrillator.  Plan:  I have encouraged the patient to continue to gradually increase his physical activity as tolerated, but Iver minded him to avoid any heavy lifting or strenuous use of his arms or shoulders for at least another 2 months. I encouraged him to participate in outpatient cardiac rehabilitation program. He seems disinclined. I have reminded the patient how important it will be for him to keep his diabetes, hypertension, and other medical problems under very strict control. The benefits of regular exercise, healthy diet, and weight loss will be extremely important. He understands  that his long-term prognosis will be dramatically affected by whether or not he can bring these chronic medical problems under control. We have not recommended any changes to his current medications at this time. The patient has been reminded that his left side visual field deficit is related to the stroke which he likely experienced several months ago. The patient will continue to follow-up with Dr. Harl Bowie and Evelina Dun. He will return to see [place] next January for routine follow-up 1 year after his original surgery.     Valentina Gu. Roxy Manns, MD 12/02/2014 5:04 PM

## 2014-12-02 NOTE — Patient Instructions (Signed)
The patient should continue to avoid any heavy lifting or strenuous use of arms or shoulders for at least a total of three months from the time of surgery.  The patient is encouraged to enroll and participate in the outpatient cardiac rehab program beginning as soon as practical.  He has been reminded that regular exercise, eating a healthy diet and losing weight will dramatically affect his long term prognosis.  The patient is reminded to make every effort to keep their diabetes under very tight control.  They should follow up closely with their primary care physician or endocrinologist and strive to keep their hemoglobin A1c levels as low as possible, preferably near or below 6.0

## 2014-12-03 ENCOUNTER — Encounter: Payer: 59 | Admitting: Nutrition

## 2014-12-03 ENCOUNTER — Encounter: Payer: Self-pay | Admitting: Nutrition

## 2014-12-03 VITALS — Ht 71.0 in | Wt 339.0 lb

## 2014-12-03 DIAGNOSIS — Z6841 Body Mass Index (BMI) 40.0 and over, adult: Secondary | ICD-10-CM | POA: Diagnosis not present

## 2014-12-03 DIAGNOSIS — Z794 Long term (current) use of insulin: Secondary | ICD-10-CM | POA: Diagnosis not present

## 2014-12-03 DIAGNOSIS — E1165 Type 2 diabetes mellitus with hyperglycemia: Secondary | ICD-10-CM

## 2014-12-03 DIAGNOSIS — IMO0002 Reserved for concepts with insufficient information to code with codable children: Secondary | ICD-10-CM

## 2014-12-03 DIAGNOSIS — E118 Type 2 diabetes mellitus with unspecified complications: Secondary | ICD-10-CM | POA: Diagnosis present

## 2014-12-03 DIAGNOSIS — Z713 Dietary counseling and surveillance: Secondary | ICD-10-CM | POA: Diagnosis not present

## 2014-12-03 NOTE — Progress Notes (Signed)
  Medical Nutrition Therapy:  Appt start time: 1200 end time:  1230.  Assessment:  Primary concerns today: Diabetes and Heart attack. Brought his wife with him today. Most recent A1c WAS 6.5%.  Strong family history of heart attacks.  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      Weight is better than it has been in the past. Testing FBS;115-119.   Has been trying to eat low sugar foods, has cut out sodas, occasionally has regular soda in am, [facility]. Eating more  whole grains. Taking  50 units of Levemir.      Not on Metformin but needs to be back on Meformin. Doesn't know why he stopped getting it prescribed.     Diet is inconsistent. Not working. Willing to make necessary changes in diet and lifestyle to improve Dm and reduce further cardiac risks and complications from DM.  Preferred Learning Style:   Auditory  Visual  Hands on   Learning Readiness:     Ready  Change in progress  MEDICATIONS: See list   DIETARY INTAKE:  24-hr recall:  B ( AM): 1/2 c mixed vegetables, 1/4 c mac/cheese, 1 cup coffee Snk ( AM): none  L ( PM): hasn't eaten lunch yet Snk ( PM):  D ( PM): 6: chicken sub(lettuce,provalone, pepper jack, ranch dressing and honey mustard.), water Snk ( PM): small piece of lasagna Beverages: water, and occassonal soda  Usual physical activity: walking  Estimated energy needs: 1800 calories 200 g carbohydrates 135 g protein 50 g fat  Progress Towards Goal(s):  In progress.   Nutritional Diagnosis:  NB-1.1 Food and nutrition-related knowledge deficient related to diabetes as evidenced by A1C  Of 6.5.    Intervention:  Nutrition Counseling on diet, meal planning, diabetes as a progressive diabetes, target ranges for blood sugars, testing of blood sugars, low fat low sodium high fiber diet and prevention of complications of DM and CAD.  Goals:  Follow Diabetes Meal Plan as  instructed  Eat 3 balanced meals daily.  Low salt low fat high fiber diet  Do not skip meals.   Limit carbohydrate intake to 45-60 grams carbohydrate/meal  Cut out all snacks.  Cut out caffeine and drink only water  Increase fresh fruits and vegetables.  Add lean protein foods to meals/snacks  Monitor glucose levels as instructed by your doctor  Aim for 30 mins of physical activity daily  Keep a food journal  Bring food record and glucose log to your next nutrition visit  Get A1C down to 6% or below  Lose 2 lbs per week.  Use Mrs. Dash for seasonings and avoid salt and salt substitutes.   Teaching Method Utilized:  Visual Auditory Hands on  Handouts given during visit include: The Plate Method Carb Counting and Food Label handouts Meal Plan Card   Barriers to learning/adherence to lifestyle change: none  Demonstrated degree of understanding via:  Teach Back   Monitoring/Evaluation:  Dietary intake, exercise, meal planning, and body weight in 1 month(s).

## 2014-12-04 NOTE — Patient Instructions (Signed)
Goals:  Follow Diabetes Meal Plan as instructed  Eat 3 balanced meals daily.  Low salt low fat high fiber diet  Do not skip meals.   Limit carbohydrate intake to 45-60 grams carbohydrate/meal  Cut out all snacks.  Cut out caffeine and drink only water  Increase fresh fruits and vegetables.  Add lean protein foods to meals/snacks  Monitor glucose levels as instructed by your doctor  Aim for 30 mins of physical activity daily  Keep a food journal  Bring food record and glucose log to your next nutrition visit  Get A1C down to 6% or below  Lose 2 lbs per week.  Use Mrs. Dash for seasonings and avoid salt and salt substitutes.

## 2014-12-05 ENCOUNTER — Encounter: Payer: Self-pay | Admitting: Family

## 2014-12-05 ENCOUNTER — Ambulatory Visit (INDEPENDENT_AMBULATORY_CARE_PROVIDER_SITE_OTHER): Payer: 59 | Admitting: Family

## 2014-12-05 VITALS — BP 145/98 | HR 89 | Temp 97.0°F | Ht 71.0 in | Wt 330.6 lb

## 2014-12-05 DIAGNOSIS — IMO0002 Reserved for concepts with insufficient information to code with codable children: Secondary | ICD-10-CM

## 2014-12-05 DIAGNOSIS — I10 Essential (primary) hypertension: Secondary | ICD-10-CM

## 2014-12-05 DIAGNOSIS — E1165 Type 2 diabetes mellitus with hyperglycemia: Secondary | ICD-10-CM

## 2014-12-05 DIAGNOSIS — J069 Acute upper respiratory infection, unspecified: Secondary | ICD-10-CM

## 2014-12-05 DIAGNOSIS — E118 Type 2 diabetes mellitus with unspecified complications: Secondary | ICD-10-CM

## 2014-12-05 MED ORDER — BENZONATATE 200 MG PO CAPS: 200.0000 mg | ORAL_CAPSULE | Freq: Three times a day (TID) | ORAL | Status: DC | PRN

## 2014-12-05 MED ORDER — INSULIN PEN NEEDLE 31G X 8 MM MISC: Status: DC

## 2014-12-05 MED ORDER — AZITHROMYCIN 250 MG PO TABS: ORAL_TABLET | ORAL | Status: DC

## 2014-12-05 MED ORDER — HYDROCHLOROTHIAZIDE 25 MG PO TABS: 25.0000 mg | ORAL_TABLET | Freq: Every day | ORAL | Status: DC

## 2014-12-05 MED ORDER — INSULIN DETEMIR 100 UNIT/ML FLEXPEN: 50.0000 [IU] | PEN_INJECTOR | Freq: Every day | SUBCUTANEOUS | Status: DC

## 2014-12-05 NOTE — Patient Instructions (Signed)
DASH Eating Plan DASH stands for "Dietary Approaches to Stop Hypertension." The DASH eating plan is a healthy eating plan that has been shown to reduce high blood pressure (hypertension). Additional health benefits may include reducing the risk of type 2 diabetes mellitus, heart disease, and stroke. The DASH eating plan may also help with weight loss. WHAT DO I NEED TO KNOW ABOUT THE DASH EATING PLAN? For the DASH eating plan, you will follow these general guidelines:  Choose foods with a percent daily value for sodium of less than 5% (as listed on the food label).  Use salt-free seasonings or herbs instead of table salt or sea salt.  Check with your health care provider or pharmacist before using salt substitutes.  Eat lower-sodium products, often labeled as "lower sodium" or "no salt added."  Eat fresh foods.  Eat more vegetables, fruits, and low-fat dairy products.  Choose whole grains. Look for the word "whole" as the first word in the ingredient list.  Choose fish and skinless chicken or turkey more often than red meat. Limit fish, poultry, and meat to 6 oz (170 g) each day.  Limit sweets, desserts, sugars, and sugary drinks.  Choose heart-healthy fats.  Limit cheese to 1 oz (28 g) per day.  Eat more home-cooked food and less restaurant, buffet, and fast food.  Limit fried foods.  Cook foods using methods other than frying.  Limit canned vegetables. If you do use them, rinse them well to decrease the sodium.  When eating at a restaurant, ask that your food be prepared with less salt, or no salt if possible. WHAT FOODS CAN I EAT? Seek help from a dietitian for individual calorie needs. Grains Whole grain or whole wheat bread. Brown rice. Whole grain or whole wheat pasta. Quinoa, bulgur, and whole grain cereals. Low-sodium cereals. Corn or whole wheat flour tortillas. Whole grain cornbread. Whole grain crackers. Low-sodium crackers. Vegetables Fresh or frozen vegetables  (raw, steamed, roasted, or grilled). Low-sodium or reduced-sodium tomato and vegetable juices. Low-sodium or reduced-sodium tomato sauce and paste. Low-sodium or reduced-sodium canned vegetables.  Fruits All fresh, canned (in natural juice), or frozen fruits. Meat and Other Protein Products Ground beef (85% or leaner), grass-fed beef, or beef trimmed of fat. Skinless chicken or turkey. Ground chicken or turkey. Pork trimmed of fat. All fish and seafood. Eggs. Dried beans, peas, or lentils. Unsalted nuts and seeds. Unsalted canned beans. Dairy Low-fat dairy products, such as skim or 1% milk, 2% or reduced-fat cheeses, low-fat ricotta or cottage cheese, or plain low-fat yogurt. Low-sodium or reduced-sodium cheeses. Fats and Oils Tub margarines without trans fats. Light or reduced-fat mayonnaise and salad dressings (reduced sodium). Avocado. Safflower, olive, or canola oils. Natural peanut or almond butter. Other Unsalted popcorn and pretzels. The items listed above may not be a complete list of recommended foods or beverages. Contact your dietitian for more options. WHAT FOODS ARE NOT RECOMMENDED? Grains White bread. White pasta. White rice. Refined cornbread. Bagels and croissants. Crackers that contain trans fat. Vegetables Creamed or fried vegetables. Vegetables in a cheese sauce. Regular canned vegetables. Regular canned tomato sauce and paste. Regular tomato and vegetable juices. Fruits Dried fruits. Canned fruit in light or heavy syrup. Fruit juice. Meat and Other Protein Products Fatty cuts of meat. Ribs, chicken wings, bacon, sausage, bologna, salami, chitterlings, fatback, hot dogs, bratwurst, and packaged luncheon meats. Salted nuts and seeds. Canned beans with salt. Dairy Whole or 2% milk, cream, half-and-half, and cream cheese. Whole-fat or sweetened yogurt. Full-fat   cheeses or blue cheese. Nondairy creamers and whipped toppings. Processed cheese, cheese spreads, or cheese  curds. Condiments Onion and garlic salt, seasoned salt, table salt, and sea salt. Canned and packaged gravies. Worcestershire sauce. Tartar sauce. Barbecue sauce. Teriyaki sauce. Soy sauce, including reduced sodium. Steak sauce. Fish sauce. Oyster sauce. Cocktail sauce. Horseradish. Ketchup and mustard. Meat flavorings and tenderizers. Bouillon cubes. Hot sauce. Tabasco sauce. Marinades. Taco seasonings. Relishes. Fats and Oils Butter, stick margarine, lard, shortening, ghee, and bacon fat. Coconut, palm kernel, or palm oils. Regular salad dressings. Other Pickles and olives. Salted popcorn and pretzels. The items listed above may not be a complete list of foods and beverages to avoid. Contact your dietitian for more information. WHERE CAN I FIND MORE INFORMATION? National Heart, Lung, and Blood Institute: www.nhlbi.nih.gov/health/health-topics/topics/dash/ Document Released: 09/15/2011 Document Revised: 02/10/2014 Document Reviewed: 07/31/2013 ExitCare Patient Information 2015 [employer]. This information is not intended to replace advice given to you by your health care provider. Make sure you discuss any questions you have with your health care provider. Hypertension Hypertension, commonly called high blood pressure, is when the force of blood pumping through your arteries is too strong. Your arteries are the blood vessels that carry blood from your heart throughout your body. A blood pressure reading consists of a higher number over a lower number, such as 110/72. The higher number (systolic) is the pressure inside your arteries when your heart pumps. The lower number (diastolic) is the pressure inside your arteries when your heart relaxes. Ideally you want your blood pressure below 120/80. Hypertension forces your heart to work harder to pump blood. Your arteries may become narrow or stiff. Having hypertension puts you at risk for heart disease, stroke, and other problems.  RISK  FACTORS Some risk factors for high blood pressure are controllable. Others are not.  Risk factors you cannot control include:   Race. You may be at higher risk if you are African American.  Age. Risk increases with age.  Gender. Men are at higher risk than women before age 45 years. After age 65, women are at higher risk than men. Risk factors you can control include:  Not getting enough exercise or physical activity.  Being overweight.  Getting too much fat, sugar, calories, or salt in your diet.  Drinking too much alcohol. SIGNS AND SYMPTOMS Hypertension does not usually cause signs or symptoms. Extremely high blood pressure (hypertensive crisis) may cause headache, anxiety, shortness of breath, and nosebleed. DIAGNOSIS  To check if you have hypertension, your health care provider will measure your blood pressure while you are seated, with your arm held at the level of your heart. It should be measured at least twice using the same arm. Certain conditions can cause a difference in blood pressure between your right and left arms. A blood pressure reading that is higher than normal on one occasion does not mean that you need treatment. If one blood pressure reading is high, ask your health care provider about having it checked again. TREATMENT  Treating high blood pressure includes making lifestyle changes and possibly taking medicine. Living a healthy lifestyle can help lower high blood pressure. You may need to change some of your habits. Lifestyle changes may include:  Following the DASH diet. This diet is high in fruits, vegetables, and whole grains. It is low in salt, red meat, and added sugars.  Getting at least 2 hours of brisk physical activity every week.  Losing weight if necessary.  Not smoking.  Limiting   alcoholic beverages.  Learning ways to reduce stress. If lifestyle changes are not enough to get your blood pressure under control, your health care provider may  prescribe medicine. You may need to take more than one. Work closely with your health care provider to understand the risks and benefits. HOME CARE INSTRUCTIONS  Have your blood pressure rechecked as directed by your health care provider.   Take medicines only as directed by your health care provider. Follow the directions carefully. Blood pressure medicines must be taken as prescribed. The medicine does not work as well when you skip doses. Skipping doses also puts you at risk for problems.   Do not smoke.   Monitor your blood pressure at home as directed by your health care provider. SEEK MEDICAL CARE IF:   You think you are having a reaction to medicines taken.  You have recurrent headaches or feel dizzy.  You have swelling in your ankles.  You have trouble with your vision. SEEK IMMEDIATE MEDICAL CARE IF:  You develop a severe headache or confusion.  You have unusual weakness, numbness, or feel faint.  You have severe chest or abdominal pain.  You vomit repeatedly.  You have trouble breathing. MAKE SURE YOU:   Understand these instructions.  Will watch your condition.  Will get help right away if you are not doing well or get worse. Document Released: 09/26/2005 Document Revised: 02/10/2014 Document Reviewed: 07/19/2013 ExitCare Patient Information 2015 [employer]. This information is not intended to replace advice given to you by your health care provider. Make sure you discuss any questions you have with your health care provider.  

## 2014-12-05 NOTE — Progress Notes (Signed)
Subjective:    Patient ID: Mark Leach, male    DOB: Jun 07, 1971, 44 y.o.   MRN: [id number]  Pt presents to the office to recheck BP. Pt's BP is not at goal today.  Hypertension This is a chronic problem. The current episode started more than 1 year ago. The problem has been waxing and waning since onset. The problem is uncontrolled. Associated symptoms include peripheral edema. Pertinent negatives include no anxiety, headaches, palpitations or shortness of breath. Risk factors for coronary artery disease include dyslipidemia, sedentary lifestyle, male gender and obesity. Past treatments include ACE inhibitors and beta blockers. The current treatment provides mild improvement. Compliance problems include exercise.  Hypertensive end-organ damage includes CAD/MI, CVA and heart failure. There is no history of kidney disease or a thyroid problem. There is no history of sleep apnea.  Cough This is a new problem. The current episode started more than 1 month ago. The problem has been unchanged. The problem occurs every few minutes. The cough is productive of sputum. Associated symptoms include nasal congestion and postnasal drip. Pertinent negatives include no headaches or shortness of breath. The symptoms are aggravated by lying down. He has tried rest and OTC cough suppressant for the symptoms. The treatment provided mild relief. There is no history of COPD.      Review of Systems  Constitutional: Negative.   HENT: Positive for postnasal drip.   Respiratory: Positive for cough. Negative for shortness of breath.   Cardiovascular: Negative.  Negative for palpitations.  Gastrointestinal: Negative.   Endocrine: Negative.   Genitourinary: Negative.   Musculoskeletal: Negative.   Neurological: Negative.  Negative for headaches.  Hematological: Negative.   Psychiatric/Behavioral: Negative.   All other systems reviewed and are negative.      Objective:   Physical Exam  Constitutional: He is  oriented to person, place, and time. He appears well-developed and well-nourished. No distress.  Eyes: Pupils are equal, round, and reactive to light. Right eye exhibits no discharge. Left eye exhibits no discharge.  Neck: Normal range of motion. Neck supple. No thyromegaly present.  Cardiovascular: Normal rate, regular rhythm, normal heart sounds and intact distal pulses.   No murmur heard. Pulmonary/Chest: Effort normal and breath sounds normal. No respiratory distress. He has no wheezes.  Abdominal: Soft. Bowel sounds are normal. He exhibits no distension. There is no tenderness.  Musculoskeletal: Normal range of motion. He exhibits edema (3+edema in BLE). He exhibits no tenderness.  Neurological: He is alert and oriented to person, place, and time. He has normal reflexes. No cranial nerve deficit.  Skin: Skin is warm and dry. No rash noted. No erythema.  Psychiatric: He has a normal mood and affect. His behavior is normal. Judgment and thought content normal.  Vitals reviewed.   BP 142/87 mmHg  Pulse 89  Temp(Src) 97 F (36.1 C) (Oral)  Ht  (1.803 m)  Wt 330 lb 9.6 oz (149.959 kg)  BMI 46.13 kg/m2       Assessment & Plan:  1. Essential hypertension --Pt started on HTCZ 25 mg today- Pt states he has been on this on the past but the rx "ran out" Daily blood pressure log given with instructions on how to fill out and told to bring to next visit -Dash diet information given -Exercise encouraged - Stress Management  -Continue current meds -RTO in 2 weeks  - hydrochlorothiazide (HYDRODIURIL) 25 MG tablet; Take 1 tablet (25 mg total) by mouth daily.  Dispense: 90 tablet; Refill: 3  2. URI (upper respiratory infection) - Take meds as prescribed - Use a cool mist humidifier  -Use saline nose sprays frequently -Saline irrigations of the nose can be very helpful if done frequently.  * 4X daily for 1 week*  * Use of a nettie pot can be helpful with this. Follow directions with  this* -Force fluids -For any cough or congestion  Use plain Mucinex- regular strength or max strength is fine   * Children- consult with Pharmacist for dosing -For fever or aces or pains- take tylenol or ibuprofen appropriate for age and weight.  * for fevers greater than 101 orally you may alternate ibuprofen and tylenol every  3 hours. -Throat lozenges if help - azithromycin (ZITHROMAX) 250 MG tablet; Take 500 mg once, then 250 mg for four days  Dispense: 6 tablet; Refill: 0 - benzonatate (TESSALON) 200 MG capsule; Take 1 capsule (200 mg total) by mouth 3 (three) times daily as needed.  Dispense: 30 capsule; Refill: 1  Jannifer Rodneyhristy Latreece Mochizuki, FNP

## 2014-12-10 ENCOUNTER — Ambulatory Visit (INDEPENDENT_AMBULATORY_CARE_PROVIDER_SITE_OTHER): Payer: 59 | Admitting: Cardiology

## 2014-12-10 ENCOUNTER — Telehealth: Payer: Self-pay | Admitting: Cardiology

## 2014-12-10 ENCOUNTER — Encounter: Payer: Self-pay | Admitting: Cardiology

## 2014-12-10 VITALS — BP 147/88 | HR 84 | Ht 71.0 in | Wt 333.0 lb

## 2014-12-10 DIAGNOSIS — G4730 Sleep apnea, unspecified: Secondary | ICD-10-CM

## 2014-12-10 DIAGNOSIS — I2510 Atherosclerotic heart disease of native coronary artery without angina pectoris: Secondary | ICD-10-CM

## 2014-12-10 DIAGNOSIS — I5022 Chronic systolic (congestive) heart failure: Secondary | ICD-10-CM

## 2014-12-10 DIAGNOSIS — I10 Essential (primary) hypertension: Secondary | ICD-10-CM

## 2014-12-10 MED ORDER — METOPROLOL SUCCINATE ER 100 MG PO TB24: ORAL_TABLET | ORAL | Status: DC

## 2014-12-10 NOTE — Patient Instructions (Signed)
Your physician recommends that you schedule a follow-up appointment in: 4 weeks with DR. Branch  Your physician has recommended you make the following change in your medication:   INCREASE TOPROL 200 MG DAILY.  CONTINUE ALL OTHER MEDICATIONS AS DIRECTED  You have been referred to DR. Andrey CampanileWILSON FOR SLEEP APNEA  Your physician recommends that you return for lab work BEFORE YOUR NEXT VISIT BMP/MAG  Thank you for choosing [facility]!!

## 2014-12-10 NOTE — Progress Notes (Signed)
Clinical Summary [name] is a 44 y.o.male seen today for follow up of the following medical problems.   1. CAD - hx of prior anterior STEMI in April 2014 at [facility], received stent to LAD - admit Jan 2016 with MI, cath severe CAD as described below with LVEF by LV gram 25% referred for CABG - TEE LVEF 25-30% - s/p CABG Jan 2016 (LIMA-LAD, SVG-diag, SVG-PDA,SVG-OM) - post CABG episodes of VT treated with lidocaine and amio, of note was on milrinone and pressors as well at the time. - amio stopped due to QTc of 480 according to notes  - compliant with meds - denies any SOB/DOE. Chronic stable swelling in legs, just recently started on HCTZ. - reports he was unable to afford cardiac rehab  2. HTN - does not check at home - compliant with meds   3. OSA screen - +snoring, no apneic episodes, no somnolence.   Past Medical History  Diagnosis Date  . Coronary artery disease 01/2013    anterior MI with cath showing 95% pLAD, 90% diag, 30% OM1, 70% mRCA, 60% dRCA s/p PCI with DES to LAD and diag and EF 40%  . Ischemic dilated cardiomyopathy   . Hypertension   . Morbid obesity   . Acute on chronic combined systolic and diastolic heart failure   . Visual field defect 10/12/2014  . Diabetes mellitus type 2, uncontrolled, with complications   . Stroke 10/12/2014    Patient with several month h/o left sided visual field deficit and MRI of brain revealing:  1. Medial right occipital lobe subacute infarct. 2. At least 3 punctate areas of acute nonhemorrhagic infarct are present involving the left thalamus, high posterior right frontal lobe and high a medial posterior left frontal or parietal lobe. 3. Additional white matter changes are noted beyond the areas of  . 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 Nitroglycerin Other (See Comments)    Hypotension, syncope, bradycardia     Current  Outpatient Prescriptions  Medication Sig Dispense Refill  . aspirin 81 MG tablet Take 81 mg by mouth daily.    Marland Kitchen atorvastatin (LIPITOR) 80 MG tablet Take 1 tablet (80 mg total) by mouth daily at 6 PM. 30 tablet 1  . azithromycin (ZITHROMAX) 250 MG tablet Take 500 mg once, then 250 mg for four days 6 tablet 0  . benzonatate (TESSALON) 200 MG capsule Take 1 capsule (200 mg total) by mouth 3 (three) times daily as needed. 30 capsule 1  . citalopram (CELEXA) 40 MG tablet Take 1 tablet (40 mg total) by mouth at bedtime. 30 tablet 1  . hydrochlorothiazide (HYDRODIURIL) 25 MG tablet Take 1 tablet (25 mg total) by mouth daily. 90 tablet 3  . insulin detemir (LEVEMIR) 100 UNIT/ML injection Inject 0.5 mLs (50 Units total) into the skin daily. 10 mL 11  . Insulin Detemir (LEVEMIR) 100 UNIT/ML Pen Inject 50 Units into the skin daily at 10 pm. 15 mL 11  . Insulin Pen Needle 31G X 8 MM MISC Use every night at bedtime 100 each 11  . insulin starter kit- pen needles MISC 1 kit by Other route once. 1 kit 10  . insulin starter kit- syringes MISC 1 kit by Other route once. 1 kit 10  . lisinopril (PRINIVIL,ZESTRIL) 40 MG tablet Take 1 tablet (40  mg total) by mouth daily. 90 tablet 3  . metoprolol succinate (TOPROL-XL) 100 MG 24 hr tablet Take 150 mg daily. 30 tablet 1  . Misc Natural Products (BLACK CHERRY CONCENTRATE PO) Take 1 tablet by mouth as needed (gout flare).    . oxyCODONE (OXY IR/ROXICODONE) 5 MG immediate release tablet Take 1-2 tablets (5-10 mg total) by mouth every 4 (four) hours as needed for severe pain. (Patient not taking: Reported on 12/03/2014) 30 tablet 0  . Vitamin D, Ergocalciferol, (DRISDOL) 50000 UNITS CAPS capsule Take 1 capsule (50,000 Units total) by mouth every 7 (seven) days. 12 capsule 3   No current facility-administered medications for this visit.     Past Surgical History  Procedure Laterality Date  . Cardiac catheterization  01/2013    [facility] in [place]  . Coronary  angioplasty  01/2013    PCI with stenting of LAD  . Cholecystectomy    . Left heart cath N/A 10/10/2014    Procedure: LEFT HEART CATH;  Surgeon: Thomas A Kelly, MD;  Location: [facility] CATH LAB;  Service: Cardiovascular;  Laterality: N/A;  . Coronary artery bypass graft N/A 10/13/2014    Procedure: CORONARY ARTERY BYPASS GRAFTING (CABG), ON PUMP, TIMES FOUR, USING LEFT INTERNAL MAMMARY ARTERY, BILATERAL GREATER SAPHENOUS VEINS HARVESTED ENDOSCOPICALLY;  Surgeon: Clarence H Owen, MD;  Location: [facility] OR;  Service: Open Heart Surgery;  Laterality: N/A;  . Multiple extractions with alveoloplasty N/A 10/22/2014    Procedure: Extraction of tooth #'s 1,3,4,5,6,7,8,9,10,11,12,13,14,15,16,20,21,22,24, 25,26,27,28,29,30,32 with alveoloplasty.;  Surgeon: Ronald F Kulinski, DDS;  Location: [facility] OR;  Service: Oral Surgery;  Laterality: N/A;     Allergies  Allergen Reactions  . Nitroglycerin Other (See Comments)    Hypotension, syncope, bradycardia      Family History  Problem Relation Age of Onset  . Heart attack Brother 37  . Diabetes Brother   . Arthritis Mother   . Asthma Mother   . Heart disease Maternal Uncle   . Heart disease Maternal Grandfather   . Alcohol abuse Maternal Grandfather      Social History [name] reports that he quit smoking about 7 years ago. He does not have any smokeless tobacco history on file. [name] reports that he does not drink alcohol.   Review of Systems CONSTITUTIONAL: No weight loss, fever, chills, weakness or fatigue.  HEENT: Eyes: No visual loss, blurred vision, double vision or yellow sclerae.No hearing loss, sneezing, congestion, runny nose or sore throat.  SKIN: No rash or itching.  CARDIOVASCULAR: per HPI RESPIRATORY: No shortness of breath, cough or sputum.  GASTROINTESTINAL: No anorexia, nausea, vomiting or diarrhea. No abdominal pain or blood.  GENITOURINARY: No burning on urination, no polyuria NEUROLOGICAL: No headache, dizziness, syncope,  paralysis, ataxia, numbness or tingling in the extremities. No change in bowel or bladder control.  MUSCULOSKELETAL: No muscle, back pain, joint pain or stiffness. + leg swelling LYMPHATICS: No enlarged nodes. No history of splenectomy.  PSYCHIATRIC: No history of depression or anxiety.  ENDOCRINOLOGIC: No reports of sweating, cold or heat intolerance. No polyuria or polydipsia.  .   Physical Examination p 84 bp 147/88 Wt 333 lbs BMI Gen: resting comfortably, no acute distress HEENT: no scleral icterus, pupils equal round and reactive, no palptable cervical adenopathy,  CV: RRR, no m/r/g, no JVD Resp: Clear to auscultation bilaterally GI: abdomen is soft, non-tender, non-distended, normal bowel sounds, no hepatosplenomegaly MSK: extremities are warm, 1-2+ bilateral LE edema  Skin: warm, no rash Neuro:  no focal deficits   Psych: appropriate affect   Diagnostic Studies 10/2014 Cath ANGIOGRAPHY:  Left main: Moderate size vessel which trifurcated into an LAD and intermediate vessel and left circumflex coronary artery.  LAD: The LAD was subtotally occluded at its ostium and there was diffuse 95-99% ostial proximal stenosis and then was totally occluded in the region of the first diagonal Zakyra K[place]. There was a gap and then faint filling of a second diagonal Reef Achterberg which had a stent and there was faint filling of the LAD beyond the diagonal vessel via collaterals.  Ramus Intermediate: Small caliber vessel free of significant disease.  Left circumflex: Large caliber vessel that gave rise to 2 major marginal branches and then in the posterior lateral like coronary artery. The first marginal Jessice Madill was moderate size and had proximal 90% followed by 80% stenoses. A distal superior Maximo Spratling had 95% stenosis.  Right coronary artery: Moderate size vessel that had 95% mid stenosis and 80% distal stenosis in the region of the acute margin. The vessel supplied the PDA. There were septal collaterals to  the LAD.   Left ventriculography revealed dilated ventricle with an ejection fraction of 25% with diffuse global hypokinesis   IMPRESSION:  Severe ischemic dilated cardiomyopathy with an ejection fraction approximately 25%.  Severe multivessel coronary obstructive disease with diffusely diseased subtotal occlusion of the ostial and proximal LAD with faint collaterals to the first and second diagonal vessel and more distal LAD; left circumflex coronary artery with tandem 90 and 80% obtuse marginal 1 stenosis with distal 95% stenosis in this distal superior Zakayla Martinec of this marginal vessel; and 95% mid RCA stenosis with 80% stenosis in the region of the acute margin with evidence for septal collaterals from the PDA to the LAD.  RECOMMENDATION:  The patient has surgical anatomy and CABG revascularization surgery will be recommended with surgical consultation in the morning. The patient was started on Aggrastat post procedure to reduce likelihood of progressive thrombosis. An attempt was made to initiate low-dose IV nitroglycerin, but the patient transiently dropped his blood pressure and this was discontinued.  10/2014 TEE Study Conclusions  - Left ventricle: Septal apical and anterior wall hypokinesis The cavity size was severely dilated. Systolic function was severely reduced. The estimated ejection fraction was in the range of 25% to 30%. - Aortic valve: There was trivial regurgitation. - Left atrium: The atrium was mildly dilated. - Atrial septum: No defect or patent foramen ovale was identified. - Pericardium, extracardiac: Small pericardial effusion IVC flat no evidence of tamponade.     Assessment and Plan  1. CAD - recent MI, found to have severe multivessel disease, s/p 4 vessel CABG. LVEF 25-30% - denies any recent symptoms, tolerating medical therapy well - increase Toprol XL to 2031m daily - will need repeat echo in near future for ICD consideration - check BMET  and Mg on HCTZ  2. HTN - slightly elevated, follow with increased Toprol dose  3. OSA screen - some signs/symptoms of OSA, refer for sleep eval   F/u 3-4 weeks  JArnoldo Lenis M.D.

## 2014-12-10 NOTE — Telephone Encounter (Signed)
FYI Debbie with [facility] stated that patient's insurance does not have them listed as his PCP. Patient need to contact his insurance company and have them correct. Then he needs to contact [facility] when completed so that she can put in request for authorization

## 2014-12-11 NOTE — Telephone Encounter (Signed)
Patient informed and verbalized understanding of plan. 

## 2014-12-16 ENCOUNTER — Telehealth: Payer: Self-pay | Admitting: Family

## 2014-12-16 DIAGNOSIS — G4733 Obstructive sleep apnea (adult) (pediatric): Secondary | ICD-10-CM

## 2014-12-17 NOTE — Telephone Encounter (Signed)
Referral for sleep study sent per pt's request

## 2014-12-19 ENCOUNTER — Encounter: Payer: Self-pay | Admitting: Family

## 2014-12-19 ENCOUNTER — Ambulatory Visit (INDEPENDENT_AMBULATORY_CARE_PROVIDER_SITE_OTHER): Payer: 59 | Admitting: Family

## 2014-12-19 VITALS — BP 127/83 | HR 79 | Temp 97.8°F | Ht 71.0 in | Wt 329.8 lb

## 2014-12-19 DIAGNOSIS — I10 Essential (primary) hypertension: Secondary | ICD-10-CM

## 2014-12-19 DIAGNOSIS — I5043 Acute on chronic combined systolic (congestive) and diastolic (congestive) heart failure: Secondary | ICD-10-CM

## 2014-12-19 MED ORDER — LOSARTAN POTASSIUM 100 MG PO TABS: 100.0000 mg | ORAL_TABLET | Freq: Every day | ORAL | Status: DC

## 2014-12-19 NOTE — Progress Notes (Signed)
   Subjective:    Patient ID: Mark Leach, male    DOB: 1971/04/23, 44 y.o.   MRN: [id number]  Pt presents to the office to recheck BP. Pt's BP is at goal today! Hypertension This is a chronic problem. The current episode started more than 1 year ago. The problem has been resolved since onset. The problem is controlled. Associated symptoms include peripheral edema. Pertinent negatives include no anxiety, headaches, palpitations or shortness of breath. Risk factors for coronary artery disease include dyslipidemia, male gender, obesity, sedentary lifestyle, diabetes mellitus and family history. Past treatments include ACE inhibitors, beta blockers and diuretics. The current treatment provides significant improvement. Hypertensive end-organ damage includes CAD/MI, CVA and heart failure. There is no history of kidney disease or a thyroid problem. There is no history of sleep apnea.      Review of Systems  Constitutional: Negative.   HENT: Negative.   Respiratory: Positive for cough. Negative for shortness of breath.   Cardiovascular: Negative.  Negative for palpitations.  Gastrointestinal: Negative.   Endocrine: Negative.   Genitourinary: Negative.   Musculoskeletal: Negative.   Neurological: Negative.  Negative for headaches.  Hematological: Negative.   Psychiatric/Behavioral: Negative.   All other systems reviewed and are negative.      Objective:   Physical Exam  Constitutional: He is oriented to person, place, and time. He appears well-developed and well-nourished. No distress.  Eyes: Pupils are equal, round, and reactive to light. Right eye exhibits no discharge. Left eye exhibits no discharge.  Neck: Normal range of motion. Neck supple. No thyromegaly present.  Cardiovascular: Normal rate, regular rhythm, normal heart sounds and intact distal pulses.   No murmur heard. Pulmonary/Chest: Effort normal and breath sounds normal. No respiratory distress. He has no wheezes.    Abdominal: Soft. Bowel sounds are normal. He exhibits no distension. There is no tenderness.  Musculoskeletal: Normal range of motion. He exhibits no edema or tenderness.  Neurological: He is alert and oriented to person, place, and time. He has normal reflexes. No cranial nerve deficit.  Skin: Skin is warm and dry. No rash noted. No erythema.  Psychiatric: He has a normal mood and affect. His behavior is normal. Judgment and thought content normal.  Vitals reviewed.   BP 127/83 mmHg  Pulse 79  Temp(Src) 97.8 F (36.6 C) (Oral)  Ht _0  (1.803 m)  Wt 329 lb 12.8 oz (149.596 kg)  BMI 46.02 kg/m2       Assessment & Plan:  1. Essential hypertension _Pt was taken off lisinopril r/t cough- Pt started on Losartan  -Dash diet information given -Exercise encouraged - Stress Management  -Continue current meds -RTO in 2 weeks - losartan (COZAAR) 100 MG tablet; Take 1 tablet (100 mg total) by mouth daily.  Dispense: 90 tablet; Refill: 3 - BMP8+EGFR- Pt wants copy faxed to Dr. Harl Bowie [phone]  Evelina Dun, [place]

## 2014-12-19 NOTE — Addendum Note (Signed)
Addended by: Prescott GumLAND, Chezney Huether M on: 12/19/2014 03:13 PM   Modules accepted: Orders, SmartSet

## 2014-12-19 NOTE — Patient Instructions (Signed)

## 2014-12-20 LAB — BMP8+EGFR
BUN/Creatinine Ratio: 12 (ref 9–20)
BUN: 14 mg/dL (ref 6–24)
CO2: 27 mmol/L (ref 18–29)
CREATININE: 1.15 mg/dL (ref 0.76–1.27)
Calcium: 9.1 mg/dL (ref 8.7–10.2)
Chloride: 94 mmol/L — ABNORMAL LOW (ref 97–108)
GFR calc Af Amer: 90 mL/min/{1.73_m2} (ref >59)
GFR, EST NON AFRICAN AMERICAN: 78 mL/min/{1.73_m2} (ref >59)
Glucose: 103 mg/dL — ABNORMAL HIGH (ref 65–99)
POTASSIUM: 5 mmol/L (ref 3.5–5.2)
Sodium: 138 mmol/L (ref 134–144)

## 2014-12-20 LAB — MAGNESIUM: Magnesium: 1.7 mg/dL (ref 1.6–2.3)

## 2014-12-31 ENCOUNTER — Encounter: Payer: 59 | Attending: Family | Admitting: Nutrition

## 2014-12-31 DIAGNOSIS — E118 Type 2 diabetes mellitus with unspecified complications: Secondary | ICD-10-CM | POA: Insufficient documentation

## 2014-12-31 DIAGNOSIS — Z713 Dietary counseling and surveillance: Secondary | ICD-10-CM | POA: Diagnosis not present

## 2014-12-31 DIAGNOSIS — E1165 Type 2 diabetes mellitus with hyperglycemia: Secondary | ICD-10-CM

## 2014-12-31 DIAGNOSIS — Z6841 Body Mass Index (BMI) 40.0 and over, adult: Secondary | ICD-10-CM | POA: Diagnosis not present

## 2014-12-31 DIAGNOSIS — IMO0002 Reserved for concepts with insufficient information to code with codable children: Secondary | ICD-10-CM

## 2014-12-31 DIAGNOSIS — Z794 Long term (current) use of insulin: Secondary | ICD-10-CM | POA: Insufficient documentation

## 2014-12-31 NOTE — Progress Notes (Signed)
  Medical Nutrition Therapy:  Appt start time: 0915 end time:  0930  Assessment:  Primary concerns today: Follow up Diabetes and Heart Attack.  Now taking Metformin 500 mg Twice a day. CHanges made: eating more fruits and lower carb vegetables. Has cut down on fried foods. Worked on cutting down on portion sizes. Cut out sodas.  DIet still needs more fresh fruits and vegetables in his diet and they need to be more consistent in CHO content. Finances are limiting food choices.  Taking 50 units of Levemir daily. Metformin 500 mg BID.   Lost 6 lbs since last visit.   Food  Journal and BS log brought in. BS are doing better now that he is taking the Metformin daily. He still having some blood sugars in teh 200's during the day and at night time.   Preferred Learning Style:   Auditory  Visual  Hands on   Learning Readiness:     Ready  Change in progress  MEDICATIONS: See list   DIETARY INTAKE:  24-hr recall:  B ( AM): Banana 1 pk oatmeal,  Water Snk ( AM): none  L ( PM): SMoked Malaw[place]urkey slices whole packet, water Snk ( PM):  D ( PM): 6 in flatbread roast beef from [employer], water  Snk ( PM): none Beverages: water  Usual physical activity: walking  Estimated energy needs: 1800 calories 200 g carbohydrates 135 g protein 50 g fat  Progress Towards Goal(s):  In progress.   Nutritional Diagnosis:  NB-1.1 Food and nutrition-related knowledge deficient related to diabetes as evidenced by A1C  Of 6.5.    Intervention:  Nutrition Counseling on diet, meal planning, diabetes as a progressive diabetes, target ranges for blood sugars, testing of blood sugars, low fat low sodium high fiber diet and prevention of complications of DM and CAD.  Goals:  Follow Diabetes Meal Plan as instructed       Increase fresh fruits and vegetables daily.       Avoid processed meats and get meat sliced from deli.       Walk 15-30 minutes daily.       Avoid canned and high fat meats like hot dogs,  bologna and lunch meal      Lose 1 lb per week.      Avoid BS >180 after meals or before bedtime.  Teaching Method Utilized:  Visual Auditory Hands on  Handouts given during visit include: The Plate Method Carb Counting and Food Label handouts Meal Plan Card   Barriers to learning/adherence to lifestyle change: none  Demonstrated degree of understanding via:  Teach Back   Monitoring/Evaluation:  Dietary intake, exercise, meal planning, and body weight in 1 month(s).

## 2014-12-31 NOTE — Patient Instructions (Signed)
  Goals:  Follow Diabetes Meal Plan as instructed       Increase fresh fruits and vegetables daily.       Avoid processed meats and get meat sliced from deli.       Walk 15-30 minutes daily.       Avoid canned and high fat meats like hot dogs, bologna and lunch meal      Lose 1 lb per week.      Avoid BS >180 after meals or before bedtime.

## 2015-01-02 ENCOUNTER — Encounter: Payer: Self-pay | Admitting: Cardiology

## 2015-01-02 ENCOUNTER — Ambulatory Visit (INDEPENDENT_AMBULATORY_CARE_PROVIDER_SITE_OTHER): Payer: 59 | Admitting: Cardiology

## 2015-01-02 VITALS — BP 143/85 | HR 76 | Ht 71.0 in | Wt 333.0 lb

## 2015-01-02 DIAGNOSIS — I10 Essential (primary) hypertension: Secondary | ICD-10-CM

## 2015-01-02 DIAGNOSIS — I2510 Atherosclerotic heart disease of native coronary artery without angina pectoris: Secondary | ICD-10-CM | POA: Diagnosis not present

## 2015-01-02 DIAGNOSIS — I5022 Chronic systolic (congestive) heart failure: Secondary | ICD-10-CM

## 2015-01-02 MED ORDER — FUROSEMIDE 20 MG PO TABS: ORAL_TABLET | ORAL | Status: DC

## 2015-01-02 NOTE — Patient Instructions (Signed)
Your physician recommends that you schedule a follow-up appointment in: 6 weeks with Dr. Wyline MoodBranch  Your physician has recommended you make the following change in your medication:   START LASIX 20 MG DAILY AS NEEDED FOR SWELLING OR SHORTNESS OF BREATH  CONTINUE ALL OTHER MEDICATIONS AS DIRECTED  Thank you for choosing [facility]!!

## 2015-01-02 NOTE — Progress Notes (Signed)
Clinical Summary Mr. Castilleja is a 44 y.o.male seen today for follow up of the following medical problems.   1. CAD/Chronic systolic HF/ICM - hx of prior anterior STEMI in April 2014 at [facility], received stent to LAD - admit Jan 2016 with MI, cath severe CAD as described below with LVEF by LV gram 25% referred for CABG - TEE LVEF 25-30% - s/p CABG Jan 2016 (LIMA-LAD, SVG-diag, SVG-PDA,SVG-OM)  - lisinopril changed to losartan 147m daily by pcp recently. Cough somewhat better. - last visit we increased his Toprol XL to 2030mdaily. Tolerating well.  - denies any SOB/DOE. Chronic stable swelling in legs, he is compliant with daily HCTZ. He is limiting Na intake and avoiding NSAIDs.   2. HTN - does not check at home - compliant with meds   3. OSA screen - +snoring, no apneic episodes, no somnolence. - last visit referred for sleep study. He is awaiting test   Past Medical History  Diagnosis Date  . Coronary artery disease 01/2013    anterior MI with cath showing 95% pLAD, 90% diag, 30% OM1, 70% mRCA, 60% dRCA s/p PCI with DES to LAD and diag and EF 40%  . Ischemic dilated cardiomyopathy   . Hypertension   . Morbid obesity   . Acute on chronic combined systolic and diastolic heart failure   . Visual field defect 10/12/2014  . Diabetes mellitus type 2, uncontrolled, with complications   . Stroke 10/12/2014    Patient with several month h/o left sided visual field deficit and MRI of brain revealing:  1. Medial right occipital lobe subacute infarct. 2. At least 3 punctate areas of acute nonhemorrhagic infarct are present involving the left thalamus, high posterior right frontal lobe and high a medial posterior left frontal or parietal lobe. 3. Additional white matter changes are noted beyond the areas of  . 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 Nitroglycerin Other (See Comments)   Hypotension, syncope, bradycardia     Current Outpatient Prescriptions  Medication Sig Dispense Refill  . aspirin 81 MG tablet Take 81 mg by mouth daily.    . Marland Kitchentorvastatin (LIPITOR) 80 MG tablet Take 1 tablet (80 mg total) by mouth daily at 6 PM. 30 tablet 1  . benzonatate (TESSALON) 200 MG capsule Take 1 capsule (200 mg total) by mouth 3 (three) times daily as needed. 30 capsule 1  . citalopram (CELEXA) 40 MG tablet Take 1 tablet (40 mg total) by mouth at bedtime. 30 tablet 1  . hydrochlorothiazide (HYDRODIURIL) 25 MG tablet Take 1 tablet (25 mg total) by mouth daily. 90 tablet 3  . Insulin Detemir (LEVEMIR) 100 UNIT/ML Pen Inject 50 Units into the skin daily at 10 pm. (Patient taking differently: Inject 50 Units into the skin daily at 10 pm. Sometimes uses levemir pen) 15 mL 11  . Insulin Pen Needle 31G X 8 MM MISC Use every night at bedtime 100 each 11  . insulin starter kit- pen needles MISC 1 kit by Other route once. 1 kit 10  . insulin starter kit- syringes MISC 1 kit by Other route once. 1 kit 10  . losartan (COZAAR) 100 MG tablet Take 1 tablet (100 mg total) by mouth daily. 90 tablet 3  . metFORMIN (GLUCOPHAGE) 500 MG tablet Take by mouth 2 (two) times daily with  a meal.    . metoprolol succinate (TOPROL-XL) 100 MG 24 hr tablet Take 200 mg daily. 60 tablet 3  . Misc Natural Products (BLACK CHERRY CONCENTRATE PO) Take 1 tablet by mouth as needed (gout flare).    Marland Kitchen oxyCODONE (OXY IR/ROXICODONE) 5 MG immediate release tablet Take 1-2 tablets (5-10 mg total) by mouth every 4 (four) hours as needed for severe pain. 30 tablet 0  . Vitamin D, Ergocalciferol, (DRISDOL) 50000 UNITS CAPS capsule Take 1 capsule (50,000 Units total) by mouth every 7 (seven) days. 12 capsule 3   No current facility-administered medications for this visit.     Past Surgical History  Procedure Laterality Date  . Cardiac catheterization  01/2013    [facility] in [place]  . Coronary angioplasty  01/2013    PCI  with stenting of LAD  . Cholecystectomy    . Left heart cath N/A 10/10/2014    Procedure: LEFT HEART CATH;  Surgeon: Troy Sine, MD;  Location: [facility] CATH [facility];  Service: Cardiovascular;  Laterality: N/A;  . Coronary artery bypass graft N/A 10/13/2014    Procedure: CORONARY ARTERY BYPASS GRAFTING (CABG), ON PUMP, TIMES FOUR, USING LEFT INTERNAL MAMMARY ARTERY, BILATERAL GREATER SAPHENOUS VEINS HARVESTED ENDOSCOPICALLY;  Surgeon: Rexene Alberts, MD;  Location: [place];  Service: Open Heart Surgery;  Laterality: N/A;  . Multiple extractions with alveoloplasty N/A 10/22/2014    Procedure: Extraction of tooth #'s 1,3,4,5,6,7,8,9,10,11,12,13,14,15,16,20,21,22,24, 25,26,27,28,29,30,32 with alveoloplasty.;  Surgeon: Lenn Cal, DDS;  Location: [place];  Service: Oral Surgery;  Laterality: N/A;     Allergies  Allergen Reactions  . Nitroglycerin Other (See Comments)    Hypotension, syncope, bradycardia      Family History  Problem Relation Age of Onset  . Heart attack Brother 60  . Diabetes Brother   . Arthritis Mother   . Asthma Mother   . Heart disease Maternal Uncle   . Heart disease Maternal Grandfather   . Alcohol abuse Maternal Grandfather      Social History Mr. Simington reports that he quit smoking about 7 years ago. His smoking use included Cigarettes. He started smoking about 25 years ago. He has a 18 pack-year smoking history. He has never used smokeless tobacco. Mr. Cafarelli reports that he does not drink alcohol.   Review of Systems CONSTITUTIONAL: No weight loss, fever, chills, weakness or fatigue.  HEENT: Eyes: No visual loss, blurred vision, double vision or yellow sclerae.No hearing loss, sneezing, congestion, runny nose or sore throat.  SKIN: No rash or itching.  CARDIOVASCULAR: per HPI RESPIRATORY: No shortness of breath, cough or sputum.  GASTROINTESTINAL: No anorexia, nausea, vomiting or diarrhea. No abdominal pain or blood.  GENITOURINARY: No burning on urination,  no polyuria NEUROLOGICAL: No headache, dizziness, syncope, paralysis, ataxia, numbness or tingling in the extremities. No change in bowel or bladder control.  MUSCULOSKELETAL: No muscle, back pain, joint pain or stiffness.  LYMPHATICS: No enlarged nodes. No history of splenectomy.  PSYCHIATRIC: No history of depression or anxiety.  ENDOCRINOLOGIC: No reports of sweating, cold or heat intolerance. No polyuria or polydipsia.  Marland Kitchen   Physical Examination p 76 bp 143/85 Wt 333 lbs BMI 46 Gen: resting comfortably, no acute distress HEENT: no scleral icterus, pupils equal round and reactive, no palptable cervical adenopathy,  CV: RRR, no m/r/g, no JVD, no carotid bruits Resp: Clear to auscultation bilaterally GI: abdomen is soft, non-tender, non-distended, normal bowel sounds, no hepatosplenomegaly MSK: extremities are warm, no edema.  Skin: warm, no rash  Neuro:  no focal deficits Psych: appropriate affect   Diagnostic Studies 10/2014 Cath ANGIOGRAPHY:  Left main: Moderate size vessel which trifurcated into an LAD and intermediate vessel and left circumflex coronary artery.  LAD: The LAD was subtotally occluded at its ostium and there was diffuse 95-99% ostial proximal stenosis and then was totally occluded in the region of the first diagonal Zylpha Poynor. There was a gap and then faint filling of a second diagonal Garyn Waguespack which had a stent and there was faint filling of the LAD beyond the diagonal vessel via collaterals.  Ramus Intermediate: Small caliber vessel free of significant disease.  Left circumflex: Large caliber vessel that gave rise to 2 major marginal branches and then in the posterior lateral like coronary artery. The first marginal Deztiny Sarra was moderate size and had proximal 90% followed by 80% stenoses. A distal superior Shiara Mcgough had 95% stenosis.  Right coronary artery: Moderate size vessel that had 95% mid stenosis and 80% distal stenosis in the region of the acute margin. The  vessel supplied the PDA. There were septal collaterals to the LAD.   Left ventriculography revealed dilated ventricle with an ejection fraction of 25% with diffuse global hypokinesis   IMPRESSION:  Severe ischemic dilated cardiomyopathy with an ejection fraction approximately 25%.  Severe multivessel coronary obstructive disease with diffusely diseased subtotal occlusion of the ostial and proximal LAD with faint collaterals to the first and second diagonal vessel and more distal LAD; left circumflex coronary artery with tandem 90 and 80% obtuse marginal 1 stenosis with distal 95% stenosis in this distal superior Deshane Cotroneo of this marginal vessel; and 95% mid RCA stenosis with 80% stenosis in the region of the acute margin with evidence for septal collaterals from the PDA to the LAD.  RECOMMENDATION:  The patient has surgical anatomy and CABG revascularization surgery will be recommended with surgical consultation in the morning. The patient was started on Aggrastat post procedure to reduce likelihood of progressive thrombosis. An attempt was made to initiate low-dose IV nitroglycerin, but the patient transiently dropped his blood pressure and this was discontinued.  10/2014 TEE Study Conclusions  - Left ventricle: Septal apical and anterior wall hypokinesis The cavity size was severely dilated. Systolic function was severely reduced. The estimated ejection fraction was in the range of 25% to 30%. - Aortic valve: There was trivial regurgitation. - Left atrium: The atrium was mildly dilated. - Atrial septum: No defect or patent foramen ovale was identified. - Pericardium, extracardiac: Small pericardial effusion IVC flat no evidence of tamponade.      Assessment and Plan   1. CAD/Chronic systolic HF/ICM - recent MI, found to have severe multivessel disease, s/p 4 vessel CABG Jan 2016. LVEF 25-30% - denies any recent symptoms, tolerating medical therapy well - on optimal  doses of beta blocker and ARB. K of 5 on last check, will hold on starting aldactone at this time - repeat echo in next few months to consider for ICD - has some LE edema not responding to HCTZ, will start prn lasix 76m.   2. HTN - at goal, continue current meds  3. OSA screen - awaiting sleep study, he reprots it is scheduled.    F/u 3-4 weeks   F/u 6 weeks  JArnoldo Lenis M.D.

## 2015-01-05 ENCOUNTER — Encounter: Payer: Self-pay | Admitting: Cardiology

## 2015-01-06 ENCOUNTER — Ambulatory Visit (INDEPENDENT_AMBULATORY_CARE_PROVIDER_SITE_OTHER): Payer: 59 | Admitting: Family

## 2015-01-06 ENCOUNTER — Encounter: Payer: Self-pay | Admitting: Family

## 2015-01-06 ENCOUNTER — Other Ambulatory Visit: Payer: Self-pay | Admitting: *Deleted

## 2015-01-06 VITALS — BP 125/87 | HR 79 | Temp 97.6°F | Ht 71.0 in | Wt 326.2 lb

## 2015-01-06 DIAGNOSIS — E1165 Type 2 diabetes mellitus with hyperglycemia: Secondary | ICD-10-CM

## 2015-01-06 DIAGNOSIS — I10 Essential (primary) hypertension: Secondary | ICD-10-CM | POA: Diagnosis not present

## 2015-01-06 DIAGNOSIS — E118 Type 2 diabetes mellitus with unspecified complications: Secondary | ICD-10-CM | POA: Diagnosis not present

## 2015-01-06 DIAGNOSIS — IMO0002 Reserved for concepts with insufficient information to code with codable children: Secondary | ICD-10-CM

## 2015-01-06 MED ORDER — GLUCOSE BLOOD VI STRP: ORAL_STRIP | Status: DC

## 2015-01-06 MED ORDER — ATORVASTATIN CALCIUM 80 MG PO TABS: 80.0000 mg | ORAL_TABLET | Freq: Every day | ORAL | Status: DC

## 2015-01-06 MED ORDER — INSULIN SYRINGES (DISPOSABLE) U-100 1 ML MISC: Status: DC

## 2015-01-06 NOTE — Patient Instructions (Signed)
DASH Eating Plan DASH stands for "Dietary Approaches to Stop Hypertension." The DASH eating plan is a healthy eating plan that has been shown to reduce high blood pressure (hypertension). Additional health benefits may include reducing the risk of type 2 diabetes mellitus, heart disease, and stroke. The DASH eating plan may also help with weight loss. WHAT DO I NEED TO KNOW ABOUT THE DASH EATING PLAN? For the DASH eating plan, you will follow these general guidelines:  Choose foods with a percent daily value for sodium of less than 5% (as listed on the food label).  Use salt-free seasonings or herbs instead of table salt or sea salt.  Check with your health care provider or pharmacist before using salt substitutes.  Eat lower-sodium products, often labeled as "lower sodium" or "no salt added."  Eat fresh foods.  Eat more vegetables, fruits, and low-fat dairy products.  Choose whole grains. Look for the word "whole" as the first word in the ingredient list.  Choose fish and skinless chicken or turkey more often than red meat. Limit fish, poultry, and meat to 6 oz (170 g) each day.  Limit sweets, desserts, sugars, and sugary drinks.  Choose heart-healthy fats.  Limit cheese to 1 oz (28 g) per day.  Eat more home-cooked food and less restaurant, buffet, and fast food.  Limit fried foods.  Cook foods using methods other than frying.  Limit canned vegetables. If you do use them, rinse them well to decrease the sodium.  When eating at a restaurant, ask that your food be prepared with less salt, or no salt if possible. WHAT FOODS CAN I EAT? Seek help from a dietitian for individual calorie needs. Grains Whole grain or whole wheat bread. Brown rice. Whole grain or whole wheat pasta. Quinoa, bulgur, and whole grain cereals. Low-sodium cereals. Corn or whole wheat flour tortillas. Whole grain cornbread. Whole grain crackers. Low-sodium crackers. Vegetables Fresh or frozen vegetables  (raw, steamed, roasted, or grilled). Low-sodium or reduced-sodium tomato and vegetable juices. Low-sodium or reduced-sodium tomato sauce and paste. Low-sodium or reduced-sodium canned vegetables.  Fruits All fresh, canned (in natural juice), or frozen fruits. Meat and Other Protein Products Ground beef (85% or leaner), grass-fed beef, or beef trimmed of fat. Skinless chicken or turkey. Ground chicken or turkey. Pork trimmed of fat. All fish and seafood. Eggs. Dried beans, peas, or lentils. Unsalted nuts and seeds. Unsalted canned beans. Dairy Low-fat dairy products, such as skim or 1% milk, 2% or reduced-fat cheeses, low-fat ricotta or cottage cheese, or plain low-fat yogurt. Low-sodium or reduced-sodium cheeses. Fats and Oils Tub margarines without trans fats. Light or reduced-fat mayonnaise and salad dressings (reduced sodium). Avocado. Safflower, olive, or canola oils. Natural peanut or almond butter. Other Unsalted popcorn and pretzels. The items listed above may not be a complete list of recommended foods or beverages. Contact your dietitian for more options. WHAT FOODS ARE NOT RECOMMENDED? Grains White bread. White pasta. White rice. Refined cornbread. Bagels and croissants. Crackers that contain trans fat. Vegetables Creamed or fried vegetables. Vegetables in a cheese sauce. Regular canned vegetables. Regular canned tomato sauce and paste. Regular tomato and vegetable juices. Fruits Dried fruits. Canned fruit in light or heavy syrup. Fruit juice. Meat and Other Protein Products Fatty cuts of meat. Ribs, chicken wings, bacon, sausage, bologna, salami, chitterlings, fatback, hot dogs, bratwurst, and packaged luncheon meats. Salted nuts and seeds. Canned beans with salt. Dairy Whole or 2% milk, cream, half-and-half, and cream cheese. Whole-fat or sweetened yogurt. Full-fat   cheeses or blue cheese. Nondairy creamers and whipped toppings. Processed cheese, cheese spreads, or cheese  curds. Condiments Onion and garlic salt, seasoned salt, table salt, and sea salt. Canned and packaged gravies. Worcestershire sauce. Tartar sauce. Barbecue sauce. Teriyaki sauce. Soy sauce, including reduced sodium. Steak sauce. Fish sauce. Oyster sauce. Cocktail sauce. Horseradish. Ketchup and mustard. Meat flavorings and tenderizers. Bouillon cubes. Hot sauce. Tabasco sauce. Marinades. Taco seasonings. Relishes. Fats and Oils Butter, stick margarine, lard, shortening, ghee, and bacon fat. Coconut, palm kernel, or palm oils. Regular salad dressings. Other Pickles and olives. Salted popcorn and pretzels. The items listed above may not be a complete list of foods and beverages to avoid. Contact your dietitian for more information. WHERE CAN I FIND MORE INFORMATION? National Heart, Lung, and Blood Institute: www.nhlbi.nih.gov/health/health-topics/topics/dash/ Document Released: 09/15/2011 Document Revised: 02/10/2014 Document Reviewed: 07/31/2013 ExitCare Patient Information 2015 [employer]. This information is not intended to replace advice given to you by your health care provider. Make sure you discuss any questions you have with your health care provider. Hypertension Hypertension, commonly called high blood pressure, is when the force of blood pumping through your arteries is too strong. Your arteries are the blood vessels that carry blood from your heart throughout your body. A blood pressure reading consists of a higher number over a lower number, such as 110/72. The higher number (systolic) is the pressure inside your arteries when your heart pumps. The lower number (diastolic) is the pressure inside your arteries when your heart relaxes. Ideally you want your blood pressure below 120/80. Hypertension forces your heart to work harder to pump blood. Your arteries may become narrow or stiff. Having hypertension puts you at risk for heart disease, stroke, and other problems.  RISK  FACTORS Some risk factors for high blood pressure are controllable. Others are not.  Risk factors you cannot control include:   Race. You may be at higher risk if you are African American.  Age. Risk increases with age.  Gender. Men are at higher risk than women before age 45 years. After age 65, women are at higher risk than men. Risk factors you can control include:  Not getting enough exercise or physical activity.  Being overweight.  Getting too much fat, sugar, calories, or salt in your diet.  Drinking too much alcohol. SIGNS AND SYMPTOMS Hypertension does not usually cause signs or symptoms. Extremely high blood pressure (hypertensive crisis) may cause headache, anxiety, shortness of breath, and nosebleed. DIAGNOSIS  To check if you have hypertension, your health care provider will measure your blood pressure while you are seated, with your arm held at the level of your heart. It should be measured at least twice using the same arm. Certain conditions can cause a difference in blood pressure between your right and left arms. A blood pressure reading that is higher than normal on one occasion does not mean that you need treatment. If one blood pressure reading is high, ask your health care provider about having it checked again. TREATMENT  Treating high blood pressure includes making lifestyle changes and possibly taking medicine. Living a healthy lifestyle can help lower high blood pressure. You may need to change some of your habits. Lifestyle changes may include:  Following the DASH diet. This diet is high in fruits, vegetables, and whole grains. It is low in salt, red meat, and added sugars.  Getting at least 2 hours of brisk physical activity every week.  Losing weight if necessary.  Not smoking.  Limiting   alcoholic beverages.  Learning ways to reduce stress. If lifestyle changes are not enough to get your blood pressure under control, your health care provider may  prescribe medicine. You may need to take more than one. Work closely with your health care provider to understand the risks and benefits. HOME CARE INSTRUCTIONS  Have your blood pressure rechecked as directed by your health care provider.   Take medicines only as directed by your health care provider. Follow the directions carefully. Blood pressure medicines must be taken as prescribed. The medicine does not work as well when you skip doses. Skipping doses also puts you at risk for problems.   Do not smoke.   Monitor your blood pressure at home as directed by your health care provider. SEEK MEDICAL CARE IF:   You think you are having a reaction to medicines taken.  You have recurrent headaches or feel dizzy.  You have swelling in your ankles.  You have trouble with your vision. SEEK IMMEDIATE MEDICAL CARE IF:  You develop a severe headache or confusion.  You have unusual weakness, numbness, or feel faint.  You have severe chest or abdominal pain.  You vomit repeatedly.  You have trouble breathing. MAKE SURE YOU:   Understand these instructions.  Will watch your condition.  Will get help right away if you are not doing well or get worse. Document Released: 09/26/2005 Document Revised: 02/10/2014 Document Reviewed: 07/19/2013 ExitCare Patient Information 2015 [employer]. This information is not intended to replace advice given to you by your health care provider. Make sure you discuss any questions you have with your health care provider.  

## 2015-01-06 NOTE — Progress Notes (Signed)
   Subjective:    Patient ID: Mark Leach, male    DOB: 08/15/1971, 44 y.o.   MRN: [id number]  Pt presents to the office to recheck HTN. Pt's BP is at goal today!! Hypertension This is a chronic problem. The current episode started more than 1 year ago. The problem has been waxing and waning since onset. The problem is controlled. Associated symptoms include peripheral edema. Pertinent negatives include no anxiety, headaches, palpitations or shortness of breath. Risk factors for coronary artery disease include diabetes mellitus, dyslipidemia, male gender, obesity, post-menopausal state, sedentary lifestyle and family history. Past treatments include angiotensin blockers, beta blockers and diuretics. The current treatment provides moderate improvement. Hypertensive end-organ damage includes CAD/MI, CVA and heart failure. There is no history of kidney disease or a thyroid problem. There is no history of sleep apnea.      Review of Systems  Constitutional: Negative.   HENT: Negative.   Respiratory: Negative.  Negative for shortness of breath.   Cardiovascular: Negative.  Negative for palpitations.  Gastrointestinal: Negative.   Endocrine: Negative.   Genitourinary: Negative.   Musculoskeletal: Negative.   Neurological: Negative.  Negative for headaches.  Hematological: Negative.   Psychiatric/Behavioral: Negative.   All other systems reviewed and are negative.      Objective:   Physical Exam  Constitutional: He is oriented to person, place, and time. He appears well-developed and well-nourished. No distress.  HENT:  Head: Normocephalic.  Right Ear: External ear normal.  Left Ear: External ear normal.  Nose: Nose normal.  Mouth/Throat: Oropharynx is clear and moist.  Eyes: Pupils are equal, round, and reactive to light. Right eye exhibits no discharge. Left eye exhibits no discharge.  Neck: Normal range of motion. Neck supple. No thyromegaly present.  Cardiovascular: Normal rate,  regular rhythm, normal heart sounds and intact distal pulses.   No murmur heard. Pulmonary/Chest: Effort normal. No respiratory distress. He has wheezes.  Abdominal: Soft. Bowel sounds are normal. He exhibits no distension. There is no tenderness.  Musculoskeletal: Normal range of motion. He exhibits edema (1+ in BLE). He exhibits no tenderness.  Neurological: He is alert and oriented to person, place, and time. He has normal reflexes. No cranial nerve deficit.  Skin: Skin is warm and dry. No rash noted. No erythema.  Psychiatric: He has a normal mood and affect. His behavior is normal. Judgment and thought content normal.  Vitals reviewed.     BP 125/87 mmHg  Pulse 79  Temp(Src) 97.6 F (36.4 C) (Oral)  Ht 5' 11" (1.803 m)  Wt 326 lb 3.2 oz (147.963 kg)  BMI 45.52 kg/m2     Assessment & Plan:  1. Essential hypertension -Dash diet information given -Exercise encouraged - Stress Management  -Continue current meds -RTO in 3 months - BMP8+EGFR  2. Diabetes mellitus type 2, uncontrolled, with complications Low Carb diet - Insulin Syringes, Disposable, U-100 1 ML MISC; Use once daily  Dispense: 60 each; Refill: 11 - glucose blood test strip; Use as instructed  Dispense: 100 each; Refill: 12  Christy Hawks, FNP  

## 2015-01-09 ENCOUNTER — Other Ambulatory Visit: Payer: Self-pay | Admitting: Nurse Practitioner

## 2015-01-09 MED ORDER — OSELTAMIVIR PHOSPHATE 75 MG PO CAPS: 75.0000 mg | ORAL_CAPSULE | Freq: Every day | ORAL | Status: DC

## 2015-01-15 ENCOUNTER — Other Ambulatory Visit: Payer: Self-pay | Admitting: Physician Assistant

## 2015-01-19 ENCOUNTER — Other Ambulatory Visit: Payer: Self-pay | Admitting: *Deleted

## 2015-01-19 MED ORDER — CITALOPRAM HYDROBROMIDE 40 MG PO TABS: 40.0000 mg | ORAL_TABLET | Freq: Every day | ORAL | Status: DC

## 2015-02-06 ENCOUNTER — Encounter: Payer: Self-pay | Admitting: Family

## 2015-02-09 MED ORDER — METFORMIN HCL 500 MG PO TABS: 500.0000 mg | ORAL_TABLET | Freq: Two times a day (BID) | ORAL | Status: DC

## 2015-02-13 ENCOUNTER — Encounter: Payer: Self-pay | Admitting: Cardiology

## 2015-02-13 ENCOUNTER — Telehealth: Payer: Self-pay | Admitting: Cardiology

## 2015-02-13 ENCOUNTER — Ambulatory Visit (INDEPENDENT_AMBULATORY_CARE_PROVIDER_SITE_OTHER): Payer: 59 | Admitting: Cardiology

## 2015-02-13 VITALS — BP 145/93 | HR 96 | Ht 71.0 in | Wt 332.0 lb

## 2015-02-13 DIAGNOSIS — I5022 Chronic systolic (congestive) heart failure: Secondary | ICD-10-CM

## 2015-02-13 DIAGNOSIS — I2510 Atherosclerotic heart disease of native coronary artery without angina pectoris: Secondary | ICD-10-CM | POA: Diagnosis not present

## 2015-02-13 NOTE — Telephone Encounter (Signed)
[employer] ZOXW#[id number] EXP 03-30-15

## 2015-02-13 NOTE — Telephone Encounter (Signed)
Echo with contrast - AP - chronic systolic heart failure Schedule at [facility] May 10 @ 3:00 arrive at 2:45

## 2015-02-13 NOTE — Progress Notes (Signed)
Clinical Summary [name] is a 44 y.o.male seen today for follow up of the following medical problems. This is a focused visit on his history of chronic systolic HF, for more complete history please refer to prior clinic notes.   1. CAD/Chronic systolic HF/ICM - hx of prior anterior STEMI in April 2014 at [facility], received stent to LAD - admit Jan 2016 with MI, cath severe CAD as described below with LVEF by LV gram 25% referred for CABG - TEE LVEF 25-30% - s/p CABG Jan 2016 (LIMA-LAD, SVG-diag, SVG-PDA,SVG-OM)  - last visit noted to have some LE edema, started on lasix 52m prn. Swelling improved since that time. - denies any SOB, no DOE.  .  2. OSA screen - has upcoming study May 16 Past Medical History  Diagnosis Date  . Coronary artery disease 01/2013    anterior MI with cath showing 95% pLAD, 90% diag, 30% OM1, 70% mRCA, 60% dRCA s/p PCI with DES to LAD and diag and EF 40%  . Ischemic dilated cardiomyopathy   . Hypertension   . Morbid obesity   . Acute on chronic combined systolic and diastolic heart failure   . Visual field defect 10/12/2014  . Diabetes mellitus type 2, uncontrolled, with complications   . Stroke 10/12/2014    Patient with several month h/o left sided visual field deficit and MRI of brain revealing:  1. Medial right occipital lobe subacute infarct. 2. At least 3 punctate areas of acute nonhemorrhagic infarct are present involving the left thalamus, high posterior right frontal lobe and high a medial posterior left frontal or parietal lobe. 3. Additional white matter changes are noted beyond the areas of  . 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 Nitroglycerin Other (See Comments)    Hypotension, syncope, bradycardia     Current Outpatient Prescriptions  Medication Sig Dispense Refill  . aspirin 81 MG tablet Take 81 mg by mouth daily.    .Marland Kitchenatorvastatin  (LIPITOR) 80 MG tablet Take 1 tablet (80 mg total) by mouth daily at 6 PM. 30 tablet 6  . citalopram (CELEXA) 40 MG tablet Take 1 tablet (40 mg total) by mouth at bedtime. 30 tablet 2  . furosemide (LASIX) 20 MG tablet 1 tab daily as needed for swelling or shortness of breath 90 tablet 3  . glucose blood test strip Use as instructed 100 each 12  . hydrochlorothiazide (HYDRODIURIL) 25 MG tablet Take 1 tablet (25 mg total) by mouth daily. 90 tablet 3  . Insulin Detemir (LEVEMIR) 100 UNIT/ML Pen Inject 50 Units into the skin daily at 10 pm. (Patient taking differently: Inject 50 Units into the skin daily at 10 pm. Sometimes uses levemir pen) 15 mL 11  . Insulin Pen Needle 31G X 8 MM MISC Use every night at bedtime 100 each 11  . insulin starter kit- pen needles MISC 1 kit by Other route once. 1 kit 10  . insulin starter kit- syringes MISC 1 kit by Other route once. 1 kit 10  . Insulin Syringes, Disposable, U-100 1 ML MISC Use once daily 60 each 11  . losartan (COZAAR) 100 MG tablet Take 1 tablet (100 mg total) by mouth daily. 90 tablet 3  . metFORMIN (GLUCOPHAGE) 500 MG tablet Take 1 tablet (500 mg total) by mouth 2 (two) times daily  with a meal. 180 tablet 1  . metoprolol succinate (TOPROL-XL) 100 MG 24 hr tablet Take 200 mg daily. 60 tablet 3  . Misc Natural Products (BLACK CHERRY CONCENTRATE PO) Take 1 tablet by mouth as needed (gout flare).    Marland Kitchen oseltamivir (TAMIFLU) 75 MG capsule Take 1 capsule (75 mg total) by mouth daily. 10 capsule 0  . Vitamin D, Ergocalciferol, (DRISDOL) 50000 UNITS CAPS capsule Take 1 capsule (50,000 Units total) by mouth every 7 (seven) days. 12 capsule 3   No current facility-administered medications for this visit.     Past Surgical History  Procedure Laterality Date  . Cardiac catheterization  01/2013    [facility] in [place]  . Coronary angioplasty  01/2013    PCI with stenting of LAD  . Cholecystectomy    . Left heart cath N/A 10/10/2014    Procedure: LEFT  HEART CATH;  Surgeon: Mark Sine, MD;  Location: [facility] CATH LAB;  Service: Cardiovascular;  Laterality: N/A;  . Coronary artery bypass graft N/A 10/13/2014    Procedure: CORONARY ARTERY BYPASS GRAFTING (CABG), ON PUMP, TIMES FOUR, USING LEFT INTERNAL MAMMARY ARTERY, BILATERAL GREATER SAPHENOUS VEINS HARVESTED ENDOSCOPICALLY;  Surgeon: Mark Alberts, MD;  Location: [place];  Service: Open Heart Surgery;  Laterality: N/A;  . Multiple extractions with alveoloplasty N/A 10/22/2014    Procedure: Extraction of tooth #'s 1,3,4,5,6,7,8,9,10,11,12,13,14,15,16,20,21,22,24, 25,26,27,28,29,30,32 with alveoloplasty.;  Surgeon: Mark Leach, DDS;  Location: [place];  Service: Oral Surgery;  Laterality: N/A;     Allergies  Allergen Reactions  . Nitroglycerin Other (See Comments)    Hypotension, syncope, bradycardia      Family History  Problem Relation Age of Onset  . Heart attack Brother 38  . Diabetes Brother   . Arthritis Mother   . Asthma Mother   . Heart disease Maternal Uncle   . Heart disease Maternal Grandfather   . Alcohol abuse Maternal Grandfather      Social History Mark Leach reports that he quit smoking about 7 years ago. His smoking use included Cigarettes. He started smoking about 25 years ago. He has a 18 pack-year smoking history. He has never used smokeless tobacco. Mark Leach reports that he does not drink alcohol.   Review of Systems CONSTITUTIONAL: No weight loss, fever, chills, weakness or fatigue.  HEENT: Eyes: No visual loss, blurred vision, double vision or yellow sclerae.No hearing loss, sneezing, congestion, runny nose or sore throat.  SKIN: No rash or itching.  CARDIOVASCULAR: per HPI RESPIRATORY: No shortness of breath, cough or sputum.  GASTROINTESTINAL: No anorexia, nausea, vomiting or diarrhea. No abdominal pain or blood.  GENITOURINARY: No burning on urination, no polyuria NEUROLOGICAL: No headache, dizziness, syncope, paralysis, ataxia, numbness or  tingling in the extremities. No change in bowel or bladder control.  MUSCULOSKELETAL: No muscle, back pain, joint pain or stiffness.  LYMPHATICS: No enlarged nodes. No history of splenectomy.  PSYCHIATRIC: No history of depression or anxiety.  ENDOCRINOLOGIC: No reports of sweating, cold or heat intolerance. No polyuria or polydipsia.  Marland Kitchen   Physical Examination Filed Vitals:   02/13/15 0921  BP: 145/93  Pulse: 96   Filed Vitals:   02/13/15 0921  Height: 5' 11"  (1.803 m)  Weight: 332 lb (150.594 kg)    Gen: resting comfortably, no acute distress HEENT: no scleral icterus, pupils equal round and reactive, no palptable cervical adenopathy,  CV: RRR, no m/r/g, no JVD, no carotid bruits Resp: Clear to auscultation bilaterally GI: abdomen is soft, non-tender,  non-distended, normal bowel sounds, no hepatosplenomegaly MSK: extremities are warm, no edema.  Skin: warm, no rash Neuro:  no focal deficits Psych: appropriate affect   Diagnostic Studies  10/2014 Cath ANGIOGRAPHY:  Left main: Moderate size vessel which trifurcated into an LAD and intermediate vessel and left circumflex coronary artery.  LAD: The LAD was subtotally occluded at its ostium and there was diffuse 95-99% ostial proximal stenosis and then was totally occluded in the region of the first diagonal Glynis Hunsucker. There was a gap and then faint filling of a second diagonal Concepcion Gillott which had a stent and there was faint filling of the LAD beyond the diagonal vessel via collaterals.  Ramus Intermediate: Small caliber vessel free of significant disease.  Left circumflex: Large caliber vessel that gave rise to 2 major marginal branches and then in the posterior lateral like coronary artery. The first marginal Hoby Kawai was moderate size and had proximal 90% followed by 80% stenoses. A distal superior Mirren Gest had 95% stenosis.  Right coronary artery: Moderate size vessel that had 95% mid stenosis and 80% distal stenosis in the region  of the acute margin. The vessel supplied the PDA. There were septal collaterals to the LAD.   Left ventriculography revealed dilated ventricle with an ejection fraction of 25% with diffuse global hypokinesis   IMPRESSION:  Severe ischemic dilated cardiomyopathy with an ejection fraction approximately 25%.  Severe multivessel coronary obstructive disease with diffusely diseased subtotal occlusion of the ostial and proximal LAD with faint collaterals to the first and second diagonal vessel and more distal LAD; left circumflex coronary artery with tandem 90 and 80% obtuse marginal 1 stenosis with distal 95% stenosis in this distal superior Johnhenry Tippin of this marginal vessel; and 95% mid RCA stenosis with 80% stenosis in the region of the acute margin with evidence for septal collaterals from the PDA to the LAD.  RECOMMENDATION:  The patient has surgical anatomy and CABG revascularization surgery will be recommended with surgical consultation in the morning. The patient was started on Aggrastat post procedure to reduce likelihood of progressive thrombosis. An attempt was made to initiate low-dose IV nitroglycerin, but the patient transiently dropped his blood pressure and this was discontinued.  10/2014 TEE Study Conclusions  - Left ventricle: Septal apical and anterior wall hypokinesis The cavity size was severely dilated. Systolic function was severely reduced. The estimated ejection fraction was in the range of 25% to 30%. - Aortic valve: There was trivial regurgitation. - Left atrium: The atrium was mildly dilated. - Atrial septum: No defect or patent foramen ovale was identified. - Pericardium, extracardiac: Small pericardial effusion IVC flat no evidence of tamponade.    Assessment and Plan   1. CAD/Chronic systolic HF/ICM - recent MI, found to have severe multivessel disease, s/p 4 vessel CABG Jan 2016. LVEF 25-30% - denies any recent symptoms, tolerating medical therapy  well - on optimal doses of beta blocker and ARB. K of 5 on last check, will hold on starting aldactone at this time - - will obtain echo to evaluate for possible ICD, due to body habitus will request the use of echocontrast. If low LVEF also will repeat BMET and see if can start aldactone.   2. OSA screen - awaiting sleep study   F/u 3-4 weeks     Arnoldo Lenis, M.D.

## 2015-02-13 NOTE — Patient Instructions (Signed)
Your physician has requested that you have an echocardiogram. Echocardiography is a painless test that uses sound waves to create images of your heart. It provides your doctor with information about the size and shape of your heart and how well your heart's chambers and valves are working. This procedure takes approximately one hour. There are no restrictions for this procedure. Office will contact with results via phone or letter.   Continue all current medications. Follow up in  1 month  

## 2015-02-17 ENCOUNTER — Ambulatory Visit (HOSPITAL_COMMUNITY)
Admission: RE | Admit: 2015-02-17 | Discharge: 2015-02-17 | Disposition: A | Discharge status: Home / Self Care | Payer: 59 | Source: Ambulatory Visit | Attending: Cardiology | Admitting: Cardiology

## 2015-02-17 DIAGNOSIS — I5022 Chronic systolic (congestive) heart failure: Secondary | ICD-10-CM | POA: Diagnosis not present

## 2015-02-17 MED ORDER — PERFLUTREN LIPID MICROSPHERE
1.0000 mL | INTRAVENOUS | Status: AC | PRN
  Administered 2015-02-17: 1 mL via INTRAVENOUS
  Administered 2015-02-17: 2 mL via INTRAVENOUS
  Administered 2015-02-17: 1 mL via INTRAVENOUS
  Filled 2015-02-17: qty 10

## 2015-02-18 ENCOUNTER — Ambulatory Visit: Payer: Self-pay | Admitting: Nutrition

## 2015-02-18 ENCOUNTER — Telehealth: Payer: Self-pay | Admitting: *Deleted

## 2015-02-18 DIAGNOSIS — R931 Abnormal findings on diagnostic imaging of heart and coronary circulation: Secondary | ICD-10-CM

## 2015-02-18 DIAGNOSIS — I5043 Acute on chronic combined systolic (congestive) and diastolic (congestive) heart failure: Secondary | ICD-10-CM

## 2015-02-18 DIAGNOSIS — I5021 Acute systolic (congestive) heart failure: Secondary | ICD-10-CM

## 2015-02-18 NOTE — Telephone Encounter (Signed)
Pt made aware, forwarded to pcp. Orders placed for labs and referral. Will forward to schedulers and mail lab orders to pt. Pt voices understanding of plan.

## 2015-02-18 NOTE — Telephone Encounter (Signed)
-----   Message from Mark PocheJonathan F Branch, MD sent at 02/18/2015 10:29 AM EDT ----- Echo shows heart function remains weak. Please refer him to Dr Johney FrameAllred for ICD consideration. We discussed possible ICD at last visit.  Please order a BMET for him for chronic systolic HF.  Dominga FerryJ Branch MD

## 2015-02-19 ENCOUNTER — Encounter: Payer: Self-pay | Admitting: Internal Medicine

## 2015-02-23 ENCOUNTER — Ambulatory Visit (INDEPENDENT_AMBULATORY_CARE_PROVIDER_SITE_OTHER): Payer: 59 | Admitting: Internal Medicine

## 2015-02-23 ENCOUNTER — Encounter: Payer: Self-pay | Admitting: Internal Medicine

## 2015-02-23 VITALS — BP 138/80 | HR 69 | Ht 71.0 in | Wt 333.6 lb

## 2015-02-23 DIAGNOSIS — G4733 Obstructive sleep apnea (adult) (pediatric): Secondary | ICD-10-CM

## 2015-02-23 NOTE — Assessment & Plan Note (Signed)
Significantly overweight. This puts him at risk for obstructive sleep apnea and certainly aggravates cardiac disease and diabetes. Further weight loss is strongly advised.

## 2015-02-23 NOTE — Assessment & Plan Note (Signed)
High probability with morbid obesity and family history, complicated by cardiac disease and diabetes. Wife says he was worse before his heart surgery. Whether weight loss explains the difference is unclear since he still weighs way too much. Plan-we discussed and are scheduling a polysomnogram. This [facility] be done at [facility] which is [place] to [place].

## 2015-02-23 NOTE — Progress Notes (Signed)
02/23/15- 44 yoM former smoker referred courtesy of [employer]; never had sleep study. Had quad bypass last year and wants to check for OSA at the recommendation of his cardiologist, Dr. Harl Bowie. Wife is here. She reports husband was a loud snorer before his MI in January 2016, after which she lost weight. CABG at that time. Asthma as a child but denies lung disease as an adult. History of tonsillectomy. Unemployed. Usual schedule is bedtime between 10 and 11 PM, sleep latency 10 minutes, waking twice before up in the morning at no specified time. Brother with obstructive sleep apnea.  Prior to Admission medications   Medication Sig Start Date End Date Taking? Authorizing Provider  aspirin 81 MG tablet Take 81 mg by mouth daily.   Yes Historical Provider, MD  atorvastatin (LIPITOR) 80 MG tablet Take 1 tablet (80 mg total) by mouth daily at 6 PM. 01/06/15  Yes Arnoldo Lenis, MD  citalopram (CELEXA) 40 MG tablet Take 1 tablet (40 mg total) by mouth at bedtime. 01/19/15  Yes Sharion Balloon, FNP  furosemide (LASIX) 20 MG tablet 1 tab daily as needed for swelling or shortness of breath 01/02/15  Yes Arnoldo Lenis, MD  glucose blood test strip Use as instructed 01/06/15  Yes Sharion Balloon, FNP  hydrochlorothiazide (HYDRODIURIL) 25 MG tablet Take 1 tablet (25 mg total) by mouth daily. 12/05/14  Yes Sharion Balloon, FNP  Insulin Detemir (LEVEMIR) 100 UNIT/ML Pen Inject 50 Units into the skin daily at 10 pm. Patient taking differently: Inject 50 Units into the skin daily at 10 pm. Sometimes uses levemir pen 12/05/14  Yes Sharion Balloon, FNP  Insulin Pen Needle 31G X 8 MM MISC Use every night at bedtime 12/05/14  Yes Sharion Balloon, FNP  insulin starter kit- pen needles MISC 1 kit by Other route once. 10/26/14  Yes Donielle Liston Alba, PA-C  insulin starter kit- syringes MISC 1 kit by Other route once. 10/26/14  Yes Donielle Liston Alba, PA-C  Insulin Syringes, Disposable, U-100 1 ML MISC Use once  daily 01/06/15  Yes Sharion Balloon, FNP  losartan (COZAAR) 100 MG tablet Take 1 tablet (100 mg total) by mouth daily. 12/19/14  Yes Sharion Balloon, FNP  metFORMIN (GLUCOPHAGE) 500 MG tablet Take 1 tablet (500 mg total) by mouth 2 (two) times daily with a meal. 02/09/15  Yes Sharion Balloon, FNP  metoprolol succinate (TOPROL-XL) 100 MG 24 hr tablet Take 200 mg daily. 12/10/14  Yes Arnoldo Lenis, MD  Misc Natural Products (BLACK CHERRY CONCENTRATE PO) Take 1 tablet by mouth as needed (gout flare).   Yes Historical Provider, MD  Vitamin D, Ergocalciferol, (DRISDOL) 50000 UNITS CAPS capsule Take 1 capsule (50,000 Units total) by mouth every 7 (seven) days. 11/21/14  Yes Sharion Balloon, FNP   Past Medical History  Diagnosis Date  . Coronary artery disease 01/2013    anterior MI with cath showing 95% pLAD, 90% diag, 30% OM1, 70% mRCA, 60% dRCA s/p PCI with DES to LAD and diag and EF 40%  . Ischemic dilated cardiomyopathy   . Hypertension   . Morbid obesity   . Acute on chronic combined systolic and diastolic heart failure   . Visual field defect 10/12/2014  . Diabetes mellitus type 2, uncontrolled, with complications   . Stroke 10/12/2014    Patient with several month h/o left sided visual field deficit and MRI of brain revealing:  1. Medial right occipital lobe subacute  infarct. 2. At least 3 punctate areas of acute nonhemorrhagic infarct are present involving the left thalamus, high posterior right frontal lobe and high a medial posterior left frontal or parietal lobe. 3. Additional white matter changes are noted beyond the areas of  . 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 Cardiac catheterization  01/2013    [facility] in [place]  . Coronary angioplasty  01/2013    PCI with stenting of LAD  . Cholecystectomy    . Left heart cath N/A 10/10/2014    Procedure: LEFT HEART CATH;  Surgeon:  Troy Sine, MD;  Location: [facility] CATH LAB;  Service: Cardiovascular;  Laterality: N/A;  . Coronary artery bypass graft N/A 10/13/2014    Procedure: CORONARY ARTERY BYPASS GRAFTING (CABG), ON PUMP, TIMES FOUR, USING LEFT INTERNAL MAMMARY ARTERY, BILATERAL GREATER SAPHENOUS VEINS HARVESTED ENDOSCOPICALLY;  Surgeon: Rexene Alberts, MD;  Location: [facility];  Service: Open Heart Surgery;  Laterality: N/A;  . Multiple extractions with alveoloplasty N/A 10/22/2014    Procedure: Extraction of tooth #'s 1,3,4,5,6,7,8,9,10,11,12,13,14,15,16,20,21,22,24, 25,26,27,28,29,30,32 with alveoloplasty.;  Surgeon: Lenn Cal, DDS;  Location: [facility];  Service: Oral Surgery;  Laterality: N/A;   Family History  Problem Relation Age of Onset  . Heart attack Brother 86  . Diabetes Brother   . Arthritis Mother   . Asthma Mother   . Heart disease Maternal Uncle   . Heart disease Maternal Grandfather   . Alcohol abuse Maternal Grandfather    History   Social History  . Marital Status: Married    Spouse Name: N/A  . Number of Children: N/A  . Years of Education: N/A   Occupational History  . Not on file.   Social History Main Topics  . Smoking status: Former Smoker -- 1.00 packs/day for 18 years    Types: Cigarettes    Start date: 01/04/1990    Quit date: 10/12/2007  . Smokeless tobacco: Never Used  . Alcohol Use: No  . Drug Use: No  . Sexual Activity: Not on file   Other Topics Concern  . Not on file   Social History Narrative   Lives w/ wife and 49 yr old daughter in [place], full time student at [employer]   ROS-see HPI   Negative unless "+" Constitutional:    weight loss, night sweats, fevers, chills, fatigue, lassitude. HEENT:    headaches, difficulty swallowing, tooth/dental problems, sore throat,       sneezing, itching, ear ache, nasal congestion, post nasal drip, snoring CV:    chest pain, orthopnea, PND, swelling in lower extremities, anasarca,                                  dizziness,  palpitations Resp:  + shortness of breath with exertion or at rest.                +productive cough,   non-productive cough, coughing up of blood.              change in color of mucus.  wheezing.   Skin:    rash or lesions. GI:  No-   heartburn, indigestion, abdominal pain, nausea, vomiting, diarrhea,                 change  in bowel habits, loss of appetite GU: dysuria, change in color of urine, no urgency or frequency.   flank pain. MS:  + joint pain, stiffness, decreased range of motion, back pain. Neuro-     nothing unusual Psych:  change in mood or affect.  +depression or anxiety.   memory loss.  OBJ- Physical Exam General- Alert, Oriented, Affect-appropriate, Distress- none acute, +obese, +passive affect Skin- rash-none, lesions- none, excoriation- none Lymphadenopathy- none Head- atraumatic            Eyes- Gross vision intact, PERRLA, conjunctivae and secretions clear            Ears- Hearing, canals-normal            Nose- Clear, no-Septal dev, mucus, polyps, erosion, perforation             Throat- Mallampati II , mucosa clear , drainage- none, tonsils- atrophic Neck- flexible , trachea midline, no stridor , thyroid nl, carotid no bruit Chest - symmetrical excursion , unlabored           Heart/CV- RRR , no murmur , no gallop  , no rub, nl s1 s2                           - JVD- none , edema+2, stasis changes- none, varices- none           Lung- clear to P&A, wheeze- none, cough- none , dullness-none, rub- none           Chest wall-  Abd-  Br/ Gen/ Rectal- Not done, not indicated Extrem- + stasis changes and scars on lower extremities Neuro- grossly intact to observation

## 2015-02-23 NOTE — Patient Instructions (Signed)
Order- Schedule split protocol NPSG to be done at [facility] (pt lives in [place]) and read by CD[facility]    Dx OSA  We will set up a return office visit with Dr Maple HudsonYoung for about 2 weeks after the sleep study is done.

## 2015-02-27 ENCOUNTER — Telehealth: Payer: Self-pay | Admitting: Internal Medicine

## 2015-02-27 NOTE — Telephone Encounter (Signed)
Will notify pt and dr young Tobe SosSally E Ottinger

## 2015-03-02 ENCOUNTER — Encounter: Payer: Self-pay | Admitting: Internal Medicine

## 2015-03-02 ENCOUNTER — Ambulatory Visit (INDEPENDENT_AMBULATORY_CARE_PROVIDER_SITE_OTHER): Payer: 59 | Admitting: Internal Medicine

## 2015-03-02 ENCOUNTER — Other Ambulatory Visit: Payer: Self-pay | Admitting: Internal Medicine

## 2015-03-02 ENCOUNTER — Other Ambulatory Visit: Payer: Self-pay

## 2015-03-02 VITALS — BP 168/90 | HR 69 | Ht 71.0 in | Wt 338.0 lb

## 2015-03-02 DIAGNOSIS — I420 Dilated cardiomyopathy: Principal | ICD-10-CM

## 2015-03-02 DIAGNOSIS — I2510 Atherosclerotic heart disease of native coronary artery without angina pectoris: Secondary | ICD-10-CM | POA: Diagnosis not present

## 2015-03-02 DIAGNOSIS — G4733 Obstructive sleep apnea (adult) (pediatric): Secondary | ICD-10-CM | POA: Diagnosis not present

## 2015-03-02 DIAGNOSIS — I255 Ischemic cardiomyopathy: Secondary | ICD-10-CM

## 2015-03-02 NOTE — Patient Instructions (Signed)
Medication Instructions:  Your physician recommends that you continue on your current medications as directed. Please refer to the Current Medication list given to you today.   Labwork: None ordered  Testing/Procedures: Your physician has recommended that you have a defibrillator inserted. An implantable cardioverter defibrillator (ICD) is a small device that is placed in your chest or, in rare cases, your abdomen. This device uses electrical pulses or shocks to help control life-threatening, irregular heartbeats that could lead the heart to suddenly stop beating (sudden cardiac arrest). Leads are attached to the ICD that goes into your heart. This is done in the hospital and usually requires an overnight stay. Please see the instruction sheet given to you today for more information.    Follow-Up: Your physician recommends that you schedule a follow-up appointment as needed with Dr Johney FrameAllred   Any Other Special Instructions Will Be Listed Below (If Applicable).  Call Tresa EndoKelly back if you decide to proceed with ICD  [phone]  Cardioverter Defibrillator Implantation An implantable cardioverter defibrillator (ICD) is a small, lightweight, battery-powered device that is placed (implanted) under the skin in the chest or abdomen. Your caregiver may prescribe an ICD if:  You have had an irregular heart rhythm (arrhythmia) that originated in the lower chambers of the heart (ventricles).  Your heart has been damaged by a disease (such as coronary artery disease) or heart condition (such as a heart attack). An ICD consists of a battery that lasts several years, a small computer called a pulse generator, and wires called leads that go into the heart. It is used to detect and correct two dangerous arrhythmias: a rapid heart rhythm (tachycardia) and an arrhythmia in which the ventricles contract in an uncoordinated way (fibrillation). When an ICD detects tachycardia, it sends an electrical signal to the heart  that restores the heartbeat to normal (cardioversion). This signal is usually painless. If cardioversion does not work or if the ICD detects fibrillation, it delivers a small electrical shock to the heart (defibrillation) to restart the heart. The shock may feel like a strong jolt in the chest.ICDs may be programmed to correct other problems. Sometimes, ICDs are programmed to act as another type of implantable device called a pacemaker. Pacemakers are used to treat a slow heartbeat (bradycardia). LET YOUR CAREGIVER KNOW ABOUT:  Any allergies you have.  All medicines you are taking, including vitamins, herbs, eyedrops, and over-the-counter medicines and creams.  Previous problems you or members of your family have had with the use of anesthetics.  Any blood disorders you have had.  Other health problems you have. RISKS AND COMPLICATIONS Generally, the procedure to implant an ICD is safe. However, as with any surgical procedure, complications can occur. Possible complications associated with implanting an ICD include:  Swelling, bleeding, or bruising at the site where the ICD was implanted.  Infection at the site where the ICD was implanted.  A reaction to medicine used during the procedure.  Nerve, heart, or blood vessel damage.  Blood clots. BEFORE THE PROCEDURE  You may need to have blood tests, heart tests, or a chest X-ray done before the day of the procedure.  Ask your caregiver about changing or stopping your regular medicines.  Make plans to have someone drive you home. You may need to stay in the hospital overnight after the procedure.  Stop smoking at least 24 hours before the procedure.  Take a bath or shower the night before the procedure. You may need to scrub your chest or abdomen  with a special type of soap.  Do not eat or drink before your procedure for as long as directed by your caregiver. Ask if it is okay to take any needed medicine with a small sip of  water. PROCEDURE  The procedure to implant an ICD in your chest or abdomen is usually done at a hospital in a room that has a large X-ray machine called a fluoroscope. The machine will be above you during the procedure. It will help your caregiver see your heart during the procedure. Implanting an ICD usually takes 1-3 hours. Before the procedure:   Small monitors will be put on your body. They will be used to check your heart, blood pressure, and oxygen level.  A needle will be put into a vein in your hand or arm. This is called an intravenous (IV) access tube. Fluids and medicine will flow directly into your body through the IV tube.  Your chest or abdomen will be cleaned with a germ-killing (antiseptic) solution. The area may be shaved.  You may be given medicine to help you relax (sedative).  You will be given a medicine called a local anesthetic. This medicine will make the surgical site numb while the ICD is implanted. You will be sleepy but awake during the procedure. After you are numb the procedure will begin. The caregiver will:  Make a small cut (incision). This will make a pocket deep under your skin that will hold the pulse generator.  Guide the leads through a large blood vessel into your heart and attach them to the heart muscles. Depending on the ICD, the leads may go into one ventricle or they may go to both ventricles and into an upper chamber of the heart (atrium).  Test the ICD.  Close the incision with stitches, glue, or staples. AFTER THE PROCEDURE  You may feel pain. Some pain is normal. It may last a few days.  You may stay in a recovery area until the local anesthetic has worn off. Your blood pressure and pulse will be checked often. You will be taken to a room where your heart will be monitored.  A chest X-ray will be taken. This is done to check that the cardioverter defibrillator is in the right place.  You may stay in the hospital overnight.  A slight bump  may be seen over the skin where the ICD was placed. Sometimes, it is possible to feel the ICD under the skin. This is normal.  In the months and years afterward, your caregiver will check the device, the leads, and the battery every few months. Eventually, when the battery is low, the ICD will be replaced. Document Released: 06/18/2002 Document Revised: 07/17/2013 Document Reviewed: 10/15/2012 [facility] Patient Information 2015 [place], [place]. This information is not intended to replace advice given to you by your health care provider. Make sure you discuss any questions you have with your health care provider.

## 2015-03-02 NOTE — Progress Notes (Signed)
Electrophysiology Office Note   Date:  03/02/2015   ID:  ARMSTRONG CREASY, DOB 04-Mar-1971, MRN [id number]  PCP:  Sharion Balloon, FNP  Cardiologist:  Dr Harl Bowie Primary Electrophysiologist: Thompson Grayer, MD    Chief Complaint  Patient presents with  . Chronic Systolic Heart Failure     History of Present Illness: Mark Leach is a 44 y.o. male who presents today for electrophysiology evaluation.   He presents today for further risk stratification of sudden death.  He reports having acute anterior MI 01/2013 for which he presented to [facility] and underwent PCI.  He did ok until 10/2014 when he developed progressive CP and SOB.  He presented to [facility] and was found to have MI.  He presented to [facility] and underwent cath which showed severe ischemic dilated cardiomyopathy with an ejection fraction approximately 25%.  He also had severe multivessel coronary obstructive disease with diffusely diseased subtotal occlusion of the ostial and proximal LAD with faint collaterals to the first and second diagonal vessel and more distal LAD; left circumflex coronary artery with tandem 90 and 80% obtuse marginal 1 stenosis with distal 95% stenosis in this distal superior branch of this marginal vessel; and 95% mid RCA stenosis with 80% stenosis in the region of the acute margin with evidence for septal collaterals from the PDA to the LAD.  He underwent CABG x 4 with Dr Roxy Manns 10/13/14 by Dr Roxy Manns.  He has made good recovery.  Since his bypass, he has done reasonably well.  He is not very active.  He does not do cardaic rehab due to finances.  He is morbidly obese.  Presently, he reports that he can walk 1/2 mile before developing SOB, though he is primarily limited by knee arthritis and really doesn't seem to know his exercise tolerance.  + Edema,  Snores but has declined sleep study due to costs.  He has depression.  Today, he denies symptoms of palpitations, chest pain,  orthopnea, PND,   claudication, dizziness, presyncope, syncope, bleeding, or neurologic sequela. The patient is tolerating medications without difficulties and is otherwise without complaint today.    Past Medical History  Diagnosis Date  . Coronary artery disease 01/2013    anterior MI with cath showing 95% pLAD, 90% diag, 30% OM1, 70% mRCA, 60% dRCA s/p PCI with DES to LAD and diag and EF 40%  . Ischemic dilated cardiomyopathy   . Hypertension   . Morbid obesity   . Chronic systolic CHF (congestive heart failure)   . Visual field defect 10/12/2014  . Diabetes mellitus type 2, uncontrolled, with complications   . Stroke 10/12/2014    Patient with several month h/o left sided visual field deficit and MRI of brain revealing:  1. Medial right occipital lobe subacute infarct. 2. At least 3 punctate areas of acute nonhemorrhagic infarct are present involving the left thalamus, high posterior right frontal lobe and high a medial posterior left frontal or parietal lobe. 3. Additional white matter changes are noted beyond the areas of  . 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 DJD (degenerative joint disease)     knees   Past Surgical History  Procedure Laterality Date  . Cardiac catheterization  01/2013    [facility] in [place]  . Coronary angioplasty  01/2013    PCI with stenting of LAD  . Cholecystectomy    .  Left heart cath N/A 10/10/2014    Procedure: LEFT HEART CATH;  Surgeon: Troy Sine, MD;  Location: [facility] CATH LAB;  Service: Cardiovascular;  Laterality: N/A;  . Coronary artery bypass graft N/A 10/13/2014    Procedure: CORONARY ARTERY BYPASS GRAFTING (CABG), ON PUMP, TIMES FOUR, USING LEFT INTERNAL MAMMARY ARTERY, BILATERAL GREATER SAPHENOUS VEINS HARVESTED ENDOSCOPICALLY;  Surgeon: Rexene Alberts, MD;  Location: [place];  Service: Open Heart Surgery;  Laterality: N/A;  . Multiple extractions with alveoloplasty N/A 10/22/2014    Procedure: Extraction of  tooth #'s 1,3,4,5,6,7,8,9,10,11,12,13,14,15,16,20,21,22,24, 25,26,27,28,29,30,32 with alveoloplasty.;  Surgeon: Lenn Cal, DDS;  Location: [place];  Service: Oral Surgery;  Laterality: N/A;     Current Outpatient Prescriptions  Medication Sig Dispense Refill  . aspirin 81 MG tablet Take 81 mg by mouth daily.    Marland Kitchen atorvastatin (LIPITOR) 80 MG tablet Take 1 tablet (80 mg total) by mouth daily at 6 PM. 30 tablet 6  . citalopram (CELEXA) 40 MG tablet Take 1 tablet (40 mg total) by mouth at bedtime. 30 tablet 2  . furosemide (LASIX) 20 MG tablet 1 tab daily as needed for swelling or shortness of breath 90 tablet 3  . glucose blood test strip Use as instructed 100 each 12  . hydrochlorothiazide (HYDRODIURIL) 25 MG tablet Take 1 tablet (25 mg total) by mouth daily. 90 tablet 3  . Insulin Detemir (LEVEMIR) 100 UNIT/ML Pen Inject 50 Units into the skin daily at 10 pm. (Patient taking differently: Inject 50 Units into the skin daily at 10 pm. Sometimes uses levemir pen) 15 mL 11  . Insulin Pen Needle 31G X 8 MM MISC Use every night at bedtime 100 each 11  . insulin starter kit- pen needles MISC 1 kit by Other route once. 1 kit 10  . insulin starter kit- syringes MISC 1 kit by Other route once. 1 kit 10  . Insulin Syringes, Disposable, U-100 1 ML MISC Use once daily 60 each 11  . losartan (COZAAR) 100 MG tablet Take 1 tablet (100 mg total) by mouth daily. 90 tablet 3  . metFORMIN (GLUCOPHAGE) 500 MG tablet Take 1 tablet (500 mg total) by mouth 2 (two) times daily with a meal. 180 tablet 1  . metoprolol succinate (TOPROL-XL) 100 MG 24 hr tablet Take 200 mg daily. (Patient taking differently: Take 200 mg by mouth daily.) 60 tablet 3  . Misc Natural Products (BLACK CHERRY CONCENTRATE PO) Take 1 tablet by mouth daily as needed (gout flare-up).     . Vitamin D, Ergocalciferol, (DRISDOL) 50000 UNITS CAPS capsule Take 1 capsule (50,000 Units total) by mouth every 7 (seven) days. 12 capsule 3   No current  facility-administered medications for this visit.    Allergies:   Nitroglycerin   Social History:  The patient  reports that he quit smoking about 7 years ago. His smoking use included Cigarettes. He started smoking about 25 years ago. He has a 18 pack-year smoking history. He has never used smokeless tobacco. He reports that he does not drink alcohol or use illicit drugs.   Family History:  The patient's  family history includes Alcohol abuse in his maternal grandfather; Arthritis in his mother; Asthma in his mother; Diabetes in his brother; Heart attack (age of onset: 49) in his brother; Heart disease in his maternal grandfather and maternal uncle.    ROS:  Please see the history of present illness.   All other systems are reviewed and negative.  PHYSICAL EXAM: VS:  BP 168/90 mmHg  Pulse 69  Ht 5' 11"  (1.803 m)  Wt 153.316 kg (338 lb)  BMI 47.16 kg/m2 , BMI Body mass index is 47.16 kg/(m^2). GEN: morbidly obese , in no acute distress HEENT: normal Neck: no JVD, carotid bruits, or masses Cardiac: RRR; no murmurs, rubs, or gallops,no edema  Respiratory:  clear to auscultation bilaterally, normal work of breathing GI: soft, nontender, nondistended, + BS MS: no deformity or atrophy Skin: warm and dry  Neuro:  Strength and sensation are intact Psych: euthymic mood, full affect  EKG:  EKG is ordered today. The ekg ordered today shows sinus rhythm 69 bpm, QRS 100 msec   Recent Labs: 10/11/2014: TSH 3.047 10/21/2014: B Natriuretic Peptide 172.3* 10/23/2014: Hemoglobin 8.9*; Platelets 408* 11/20/2014: ALT 40 12/19/2014: BUN 14; Creatinine 1.15; Magnesium 1.7; Potassium 5.0; Sodium 138    Lipid Panel     Component Value Date/Time   CHOL 161 10/12/2014 1630   TRIG 220* 10/12/2014 1630   HDL 23* 10/12/2014 1630   CHOLHDL 7.0 10/12/2014 1630   VLDL 44* 10/12/2014 1630   LDLCALC 94 10/12/2014 1630     Wt Readings from Last 3 Encounters:  03/02/15 153.316 kg (338 lb)  02/23/15  151.32 kg (333 lb 9.6 oz)  02/13/15 150.594 kg (332 lb)      Other studies Reviewed: Additional studies/ records that were reviewed today include: Dr Nelly Laurence records , echo 5/16 Review of the above records today demonstrates: EF 30%   ASSESSMENT AND PLAN:  The patient has an ischemic CM (EF 30-35%), NYHA Class II/III CHF, and CAD. At this time, he meets MADIT II/ SCD-HeFT criteria for ICD implantation for primary prevention of sudden death.  He has a narrow QRS and therefore is not a candidate for CRT. Risks, benefits, alternatives to single chamber ICD implantation were discussed in detail with the patient today. The patient  understands that the risks include but are not limited to bleeding, infection, pneumothorax, perforation, tamponade, vascular damage, renal failure, MI, stroke, death, inappropriate shocks, and lead dislodgement and wishes to avoid ICD implantation at this time.  He will think about this further and contact our office should he wish to proceed.  Obesity- weight reduction is strongly advised.    He will follow-up with Dr Harl Bowie for medical management on his CM going forward and I will see as needed.  Current medicines are reviewed at length with the patient today.   The patient does not have concerns regarding his medicines.  The following changes were made today:  none  Signed, Thompson Grayer, MD  03/02/2015 9:31 AM     [facility] [address] [place] [address] [phone] (office) [phone] (fax)

## 2015-03-04 ENCOUNTER — Institutional Professional Consult (permissible substitution): Payer: Self-pay | Admitting: Internal Medicine

## 2015-03-11 DIAGNOSIS — Z0271 Encounter for disability determination: Secondary | ICD-10-CM

## 2015-03-25 ENCOUNTER — Ambulatory Visit: Payer: 59 | Admitting: Internal Medicine

## 2015-03-25 DIAGNOSIS — G4730 Sleep apnea, unspecified: Secondary | ICD-10-CM | POA: Diagnosis not present

## 2015-03-31 ENCOUNTER — Encounter: Payer: Self-pay | Admitting: Internal Medicine

## 2015-03-31 ENCOUNTER — Other Ambulatory Visit: Payer: Self-pay | Admitting: *Deleted

## 2015-03-31 ENCOUNTER — Encounter: Payer: Self-pay | Admitting: Cardiology

## 2015-03-31 ENCOUNTER — Ambulatory Visit (INDEPENDENT_AMBULATORY_CARE_PROVIDER_SITE_OTHER): Payer: 59 | Admitting: Cardiology

## 2015-03-31 VITALS — BP 143/88 | HR 75 | Ht 71.0 in | Wt 343.8 lb

## 2015-03-31 DIAGNOSIS — I10 Essential (primary) hypertension: Secondary | ICD-10-CM | POA: Diagnosis not present

## 2015-03-31 DIAGNOSIS — I2510 Atherosclerotic heart disease of native coronary artery without angina pectoris: Secondary | ICD-10-CM

## 2015-03-31 DIAGNOSIS — I5022 Chronic systolic (congestive) heart failure: Secondary | ICD-10-CM | POA: Diagnosis not present

## 2015-03-31 DIAGNOSIS — G4730 Sleep apnea, unspecified: Secondary | ICD-10-CM | POA: Diagnosis not present

## 2015-03-31 DIAGNOSIS — G4733 Obstructive sleep apnea (adult) (pediatric): Secondary | ICD-10-CM

## 2015-03-31 NOTE — Patient Instructions (Signed)
Your physician recommends that you schedule a follow-up appointment in: 4 weeks with Dr. Wyline Mood   Your physician has recommended you make the following change in your medication:   TAKE LASIX DAILY FOR THE NEXT 5 DAYS.  CALL OUR OFFICE ON Monday WITH AN UPDATE ON SWELLING   Thank you for choosing [facility]!!

## 2015-03-31 NOTE — Progress Notes (Addendum)
Clinical Summary Mark Leach is a 44 y.o.male seen today for follow up of the following medical problems.   1. CAD/Chronic systolic HF/ICM - hx of prior anterior STEMI in April 2014 at [facility], received stent to LAD - admit Jan 2016 with MI, cath severe CAD as described below with LVEF by LV gram 25% referred for CABG - TEE LVEF 25-30% - s/p CABG Jan 2016 (LIMA-LAD, SVG-diag, SVG-PDA,SVG-OM) - seen by EP 02/2015 for ICD evaluation, patient requested to hold off at that time.   - still with LE edema since last visit, weight has trended up 10 lbs over the last few months. Has prn lasix written for but only taking lasix once weekly.   2. OSA - mildly abnormal by sleep study, followed by Dr Annamaria Boots pulmonary - has f/u with Dr Annamaria Boots later this week.   3. HTN - does not check at home - compliant with meds  Past Medical History  Diagnosis Date  . Coronary artery disease 01/2013    anterior MI with cath showing 95% pLAD, 90% diag, 30% OM1, 70% mRCA, 60% dRCA s/p PCI with DES to LAD and diag and EF 40%  . Ischemic dilated cardiomyopathy   . Hypertension   . Morbid obesity   . Chronic systolic CHF (congestive heart failure)   . Visual field defect 10/12/2014  . Diabetes mellitus type 2, uncontrolled, with complications   . Stroke 10/12/2014    Patient with several month h/o left sided visual field deficit and MRI of brain revealing:  1. Medial right occipital lobe subacute infarct. 2. At least 3 punctate areas of acute nonhemorrhagic infarct are present involving the left thalamus, high posterior right frontal lobe and high a medial posterior left frontal or parietal lobe. 3. Additional white matter changes are noted beyond the areas of  . 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 DJD (degenerative joint disease)     knees     Allergies  Allergen Reactions  . Nitroglycerin Other (See Comments)    Hypotension, syncope,  bradycardia     Current Outpatient Prescriptions  Medication Sig Dispense Refill  . aspirin 81 MG tablet Take 81 mg by mouth daily.    Marland Kitchen atorvastatin (LIPITOR) 80 MG tablet Take 1 tablet (80 mg total) by mouth daily at 6 PM. 30 tablet 6  . citalopram (CELEXA) 40 MG tablet Take 1 tablet (40 mg total) by mouth at bedtime. 30 tablet 2  . furosemide (LASIX) 20 MG tablet 1 tab daily as needed for swelling or shortness of breath 90 tablet 3  . glucose blood test strip Use as instructed 100 each 12  . hydrochlorothiazide (HYDRODIURIL) 25 MG tablet Take 1 tablet (25 mg total) by mouth daily. 90 tablet 3  . Insulin Detemir (LEVEMIR) 100 UNIT/ML Pen Inject 50 Units into the skin daily at 10 pm. (Patient taking differently: Inject 50 Units into the skin daily at 10 pm. Sometimes uses levemir pen) 15 mL 11  . Insulin Pen Needle 31G X 8 MM MISC Use every night at bedtime 100 each 11  . insulin starter kit- pen needles MISC 1 kit by Other route once. 1 kit 10  . insulin starter kit- syringes MISC 1 kit by Other route once. 1 kit 10  . Insulin Syringes, Disposable, U-100 1 ML MISC Use once daily 60 each 11  . losartan (  COZAAR) 100 MG tablet Take 1 tablet (100 mg total) by mouth daily. 90 tablet 3  . metFORMIN (GLUCOPHAGE) 500 MG tablet Take 1 tablet (500 mg total) by mouth 2 (two) times daily with a meal. 180 tablet 1  . metoprolol succinate (TOPROL-XL) 100 MG 24 hr tablet Take 200 mg daily. (Patient taking differently: Take 200 mg by mouth daily.) 60 tablet 3  . Misc Natural Products (BLACK CHERRY CONCENTRATE PO) Take 1 tablet by mouth daily as needed (gout flare-up).     . Vitamin D, Ergocalciferol, (DRISDOL) 50000 UNITS CAPS capsule Take 1 capsule (50,000 Units total) by mouth every 7 (seven) days. 12 capsule 3   No current facility-administered medications for this visit.     Past Surgical History  Procedure Laterality Date  . Cardiac catheterization  01/2013    [facility] in [place]  . Coronary  angioplasty  01/2013    PCI with stenting of LAD  . Cholecystectomy    . Left heart cath N/A 10/10/2014    Procedure: LEFT HEART CATH;  Surgeon: Troy Sine, MD;  Location: [facility] CATH LAB;  Service: Cardiovascular;  Laterality: N/A;  . Coronary artery bypass graft N/A 10/13/2014    Procedure: CORONARY ARTERY BYPASS GRAFTING (CABG), ON PUMP, TIMES FOUR, USING LEFT INTERNAL MAMMARY ARTERY, BILATERAL GREATER SAPHENOUS VEINS HARVESTED ENDOSCOPICALLY;  Surgeon: Rexene Alberts, MD;  Location: [place];  Service: Open Heart Surgery;  Laterality: N/A;  . Multiple extractions with alveoloplasty N/A 10/22/2014    Procedure: Extraction of tooth #'s 1,3,4,5,6,7,8,9,10,11,12,13,14,15,16,20,21,22,24, 25,26,27,28,29,30,32 with alveoloplasty.;  Surgeon: Lenn Cal, DDS;  Location: [place];  Service: Oral Surgery;  Laterality: N/A;     Allergies  Allergen Reactions  . Nitroglycerin Other (See Comments)    Hypotension, syncope, bradycardia      Family History  Problem Relation Age of Onset  . Heart attack Brother 9  . Diabetes Brother   . Arthritis Mother   . Asthma Mother   . Heart disease Maternal Uncle   . Heart disease Maternal Grandfather   . Alcohol abuse Maternal Grandfather      Social History Mark Leach reports that he quit smoking about 7 years ago. His smoking use included Cigarettes. He started smoking about 25 years ago. He has a 18 pack-year smoking history. He has never used smokeless tobacco. Mark Leach reports that he does not drink alcohol.   Review of Systems CONSTITUTIONAL: No weight loss, fever, chills, weakness or fatigue.  HEENT: Eyes: No visual loss, blurred vision, double vision or yellow sclerae.No hearing loss, sneezing, congestion, runny nose or sore throat.  SKIN: No rash or itching.  CARDIOVASCULAR: per HPI RESPIRATORY: No shortness of breath, cough or sputum.  GASTROINTESTINAL: No anorexia, nausea, vomiting or diarrhea. No abdominal pain or blood.    GENITOURINARY: No burning on urination, no polyuria NEUROLOGICAL: No headache, dizziness, syncope, paralysis, ataxia, numbness or tingling in the extremities. No change in bowel or bladder control.  MUSCULOSKELETAL: No muscle, back pain, joint pain or stiffness.  LYMPHATICS: No enlarged nodes. No history of splenectomy.  PSYCHIATRIC: No history of depression or anxiety.  ENDOCRINOLOGIC: No reports of sweating, cold or heat intolerance. No polyuria or polydipsia.  Marland Kitchen   Physical Examination Filed Vitals:   03/31/15 1557  BP: 143/88  Pulse: 75   Filed Vitals:   03/31/15 1557  Height: 5' 11" (1.803 m)  Weight: 343 lb 12.8 oz (155.947 kg)    Gen: resting comfortably, no acute distress HEENT: no scleral  icterus, pupils equal round and reactive, no palptable cervical adenopathy,  CV: RRR, no m/r/g, no JVD Resp: Clear to auscultation bilaterally GI: abdomen is soft, non-tender, non-distended, normal bowel sounds, no hepatosplenomegaly MSK: extremities are warm, no edema.  Skin: warm, no rash Neuro:  no focal deficits Psych: appropriate affect   Diagnostic Studies  10/2014 Cath ANGIOGRAPHY:  Left main: Moderate size vessel which trifurcated into an LAD and intermediate vessel and left circumflex coronary artery.  LAD: The LAD was subtotally occluded at its ostium and there was diffuse 95-99% ostial proximal stenosis and then was totally occluded in the region of the first diagonal branch. There was a gap and then faint filling of a second diagonal branch which had a stent and there was faint filling of the LAD beyond the diagonal vessel via collaterals.  Ramus Intermediate: Small caliber vessel free of significant disease.  Left circumflex: Large caliber vessel that gave rise to 2 major marginal branches and then in the posterior lateral like coronary artery. The first marginal branch was moderate size and had proximal 90% followed by 80% stenoses. A distal superior branch had  95% stenosis.  Right coronary artery: Moderate size vessel that had 95% mid stenosis and 80% distal stenosis in the region of the acute margin. The vessel supplied the PDA. There were septal collaterals to the LAD.   Left ventriculography revealed dilated ventricle with an ejection fraction of 25% with diffuse global hypokinesis   IMPRESSION:  Severe ischemic dilated cardiomyopathy with an ejection fraction approximately 25%.  Severe multivessel coronary obstructive disease with diffusely diseased subtotal occlusion of the ostial and proximal LAD with faint collaterals to the first and second diagonal vessel and more distal LAD; left circumflex coronary artery with tandem 90 and 80% obtuse marginal 1 stenosis with distal 95% stenosis in this distal superior branch of this marginal vessel; and 95% mid RCA stenosis with 80% stenosis in the region of the acute margin with evidence for septal collaterals from the PDA to the LAD.  RECOMMENDATION:  The patient has surgical anatomy and CABG revascularization surgery will be recommended with surgical consultation in the morning. The patient was started on Aggrastat post procedure to reduce likelihood of progressive thrombosis. An attempt was made to initiate low-dose IV nitroglycerin, but the patient transiently dropped his blood pressure and this was discontinued.  10/2014 TEE Study Conclusions  - Left ventricle: Septal apical and anterior wall hypokinesis The cavity size was severely dilated. Systolic function was severely reduced. The estimated ejection fraction was in the range of 25% to 30%. - Aortic valve: There was trivial regurgitation. - Left atrium: The atrium was mildly dilated. - Atrial septum: No defect or patent foramen ovale was identified. - Pericardium, extracardiac: Small pericardial effusion IVC flat no evidence of tamponade.     Assessment and Plan   1. CAD/Chronic systolic HF/ICM - recent MI, found to have  severe multivessel disease, s/p 4 vessel CABG Jan 2016. LVEF 25-30% - on optimal doses of beta blocker and ARB. Will repeat labs, if K ok will start aldactone.  - he has elected to hold off on ICD for now, will continue to follow at this time - volume overloaded, asked to tak his lasix regularly for the next 5 days then change back to prn, asked to call us in 5 days with update on swelling and weight.   2. OSA screen - has f/u with pulmonary this week  3. HTN - above goal based on his history  of DM2. WIll follow bp as he diuresis, may need further titration of bp agents if remains elevated.      Mark Leach, M.D.

## 2015-04-01 ENCOUNTER — Telehealth: Payer: Self-pay | Admitting: *Deleted

## 2015-04-01 DIAGNOSIS — I420 Dilated cardiomyopathy: Principal | ICD-10-CM

## 2015-04-01 DIAGNOSIS — I255 Ischemic cardiomyopathy: Secondary | ICD-10-CM

## 2015-04-01 MED ORDER — SPIRONOLACTONE 25 MG PO TABS: 12.5000 mg | ORAL_TABLET | Freq: Every day | ORAL | Status: DC

## 2015-04-01 NOTE — Telephone Encounter (Signed)
-----   Message from Antoine Poche, MD sent at 04/01/2015  8:41 AM EDT ----- Labs received from [facility], look good. I would like to start him on one additional medicine to help strengthen his heart, please start aldactone 12.5mg  daily. Needs repeat BMET in 2 weeks   Dominga Ferry MD

## 2015-04-01 NOTE — Telephone Encounter (Signed)
Pt aware, medication sent to pharmacy and lab orders mailed to pt.

## 2015-04-02 ENCOUNTER — Encounter: Payer: Self-pay | Admitting: Internal Medicine

## 2015-04-02 ENCOUNTER — Ambulatory Visit (INDEPENDENT_AMBULATORY_CARE_PROVIDER_SITE_OTHER): Payer: 59 | Admitting: Internal Medicine

## 2015-04-02 VITALS — BP 136/76 | HR 73 | Ht 71.0 in | Wt 340.0 lb

## 2015-04-02 DIAGNOSIS — G4733 Obstructive sleep apnea (adult) (pediatric): Secondary | ICD-10-CM | POA: Diagnosis not present

## 2015-04-02 NOTE — Patient Instructions (Addendum)
You have very mild obstructive sleep apnea, stopping breathing about 6 times per hour. The normal range is up to 5 and we consider it mild up to 15.  Because you have had heart problems, and the issue of a defibrillator has come up, I recommend that we try CPAP.  Order- New DME new CPAP auto 5-20   Mask of choice, humidifier, supplies  Dx OSA

## 2015-04-02 NOTE — Progress Notes (Signed)
02/23/15- 9 yoM former smoker referred courtesy of [employer]; never had sleep study. Had quad bypass last year and wants to check for OSA at the recommendation of his cardiologist, Dr. Wyline Mood. Wife is here. She reports husband was a loud snorer before his MI in January 2016, after which he lost weight. CABG at that time. Asthma as a child but denies lung disease as an adult. History of tonsillectomy. Unemployed. Usual schedule is bedtime between 10 and 11 PM, sleep latency 10 minutes, waking twice before up in the morning at no specified time. Brother with obstructive sleep apnea.  04/02/15- 72 yoM former smoker followed for OSA, complicated by CAD/ MI, DM2 Unattended Home Sleep study 03/25/15 Mild OSA, AHI 6.1/ hr, desat to 79%, weight 333 lbs. FOLLOWS FOR: review home sleep test.  pt has no complaints at this time. He insists he feels "fine" and denies daytime tiredness. Son uses CPAP. He slept on his back for his home study.  His cardiologist raised initial question of OSA and they have been considering implanted defibrillator.  ROS-see HPI   Negative unless "+" Constitutional:    weight loss, night sweats, fevers, chills, fatigue, lassitude. HEENT:    headaches, difficulty swallowing, tooth/dental problems, sore throat,       sneezing, itching, ear ache, nasal congestion, post nasal drip, snoring CV:    chest pain, orthopnea, PND, swelling in lower extremities, anasarca,                                  dizziness, palpitations Resp:  + shortness of breath with exertion or at rest.                +productive cough,   non-productive cough, coughing up of blood.              change in color of mucus.  wheezing.   Skin:    rash or lesions. GI:  No-   heartburn, indigestion, abdominal pain, nausea, vomiting,  GU: . MS:  + joint pain, stiffness,  Neuro-     nothing unusual Psych:  change in mood or affect.  +depression or anxiety.   memory loss.  OBJ- Physical Exam General- Alert,  Oriented, Affect-appropriate, Distress- none acute, +obese, +passive affect Skin- rash-none, lesions- none, excoriation- none Lymphadenopathy- none Head- atraumatic            Eyes- Gross vision intact, PERRLA, conjunctivae and secretions clear            Ears- Hearing, canals-normal            Nose- Clear, no-Septal dev, mucus, polyps, erosion, perforation             Throat- Mallampati II , mucosa clear , drainage- none, tonsils- atrophic Neck- flexible , trachea midline, no stridor , thyroid nl, carotid no bruit Chest - symmetrical excursion , unlabored           Heart/CV- RRR , no murmur , no gallop  , no rub, nl s1 s2                           - JVD- none , edema+2, stasis changes- none, varices- none           Lung- clear to P&A, wheeze- none, cough- none , dullness-none, rub- none           Chest wall-  Abd-  Br/ Gen/ Rectal- Not done, not indicated Extrem- + stasis changes and scars on lower extremities Neuro- grossly intact to observation

## 2015-04-03 ENCOUNTER — Telehealth: Payer: Self-pay | Admitting: Family

## 2015-04-03 NOTE — Assessment & Plan Note (Signed)
This is a very mild obstructive apnea and conservative management was discussed-weight loss, sleep off flat of back. My concern is with his known cardiac disease and the understanding that he is considered marginal enough that he may need an implanted defibrillator. That suggests we want to prevent any apnea and desaturation that we can. We discussed CPAP and oral appliances. With the greater confidence and reliability provided by CPAP, I have recommended we seek a home trial of CPAP therapy.

## 2015-04-06 ENCOUNTER — Encounter: Payer: Self-pay | Admitting: Nutrition

## 2015-04-06 ENCOUNTER — Encounter: Payer: 59 | Attending: Family | Admitting: Nutrition

## 2015-04-06 VITALS — Ht 71.0 in | Wt 338.4 lb

## 2015-04-06 DIAGNOSIS — Z794 Long term (current) use of insulin: Secondary | ICD-10-CM | POA: Diagnosis not present

## 2015-04-06 DIAGNOSIS — Z713 Dietary counseling and surveillance: Secondary | ICD-10-CM | POA: Diagnosis not present

## 2015-04-06 DIAGNOSIS — E118 Type 2 diabetes mellitus with unspecified complications: Secondary | ICD-10-CM

## 2015-04-06 DIAGNOSIS — Z6841 Body Mass Index (BMI) 40.0 and over, adult: Secondary | ICD-10-CM | POA: Diagnosis not present

## 2015-04-06 NOTE — Patient Instructions (Signed)
Goals:  Cut out sausage and fast foods that have a lot of fat and sodium       Increase fresh fruits and vegetables daily 3-5 servings per day..       Avoid processed meats and get meat sliced from deli.       Walk 30 minutes daily.       Avoid canned and high fat meats like hot dogs, bologna and lunch meal      Lose 1-2 lbs per week; 8 lbs per month..      Only Test blood sugars in am and before bed a few times per week.  Need A1C rechecked. Use only Mrs. Dash and other herbs and spices and notify MD if swelling.

## 2015-04-06 NOTE — Telephone Encounter (Signed)
lmovm at [facility] to ocntact Dr. Alona Bene office face to face for CPAP

## 2015-04-06 NOTE — Progress Notes (Signed)
  Medical Nutrition Therapy:  Appt start time: 0915 end time:  0930  Assessment:  Primary concerns today: Follow up Diabetes and Heart Attack. Hasn't eaten any processed meats. Suppose to get dentures this week.  Weight gain is probably from eating out more than usual. He notes he is losing inches. BS was 108 this am.  Metformin 500 mg BID. Physical activity: 30 minutes daily most days of the week. His food journal reveals he is currently eating a lot of fast foods; [name] and [employer]. He notes he has been eating more meals away from home due to travel and his daughters graduation. Diet is high in fat and sodium right now and is concerning due to history of heart attack. Stressed need for a low sodium low fat high fiber diet and weight loss.   Recently put on Spirlactalone. Couldn't afford Cardiac Rehab. . Preferred Learning Style:   Auditory  Visual  Hands on   Learning Readiness:     Ready  Change in progress  MEDICATIONS: See list   DIETARY INTAKE:  24-hr recall:  B ( AM): Skipped:  2 breakfast burritos from [employer] and decaf coffee.. Snk ( AM): none  L ( PM):  Grilled chicken ranch wrap and the breakfast burritos, water Snk ( PM):  D ( PM): Shrimp and rice, avacado and vegetables,-japanese food. Snk ( PM): none Beverages: water  Usual physical activity: walking  Estimated energy needs: 1800 calories 200 g carbohydrates 135 g protein 50 g fat  Progress Towards Goal(s):  In progress.   Nutritional Diagnosis:  NB-1.1 Food and nutrition-related knowledge deficient related to diabetes as evidenced by A1C  Of 6.5.    Intervention:  Nutrition Counseling on diet, meal planning, diabetes as a progressive diabetes, target ranges for blood sugars, testing of blood sugars, low fat low sodium high fiber diet and prevention of complications of DM and CAD.  Goals:  Cut out sausage and fast foods that have a lot of fat and sodium       Increase fresh fruits and  vegetables daily 3-5 servings per day..       Avoid processed meats and get meat sliced from deli.       Walk 30 minutes daily.       Avoid canned and high fat meats like hot dogs, bologna and lunch meal      Lose 1-2 lbs per week; 8 lbs per month..      Only Test blood sugars in am and before bed a few times per week.  Need A1C rechecked. Use only Mrs. Dash and other herbs and spices and notify MD if swelling.  Teaching Method Utilized:  Visual Auditory Hands on  Handouts given during visit include: The Plate Method Carb Counting and Food Label handouts Meal Plan Card  Barriers to learning/adherence to lifestyle change: none  Demonstrated degree of understanding via:  Teach Back   Monitoring/Evaluation:  Dietary intake, exercise, meal planning, SBG and body weight in 3 month(s).

## 2015-04-06 NOTE — Telephone Encounter (Signed)
I do not see a face to face encounter for this.  How should we handle?

## 2015-04-09 ENCOUNTER — Ambulatory Visit: Payer: 59 | Admitting: Family

## 2015-04-16 ENCOUNTER — Ambulatory Visit (INDEPENDENT_AMBULATORY_CARE_PROVIDER_SITE_OTHER): Payer: 59 | Admitting: Family

## 2015-04-16 ENCOUNTER — Encounter: Payer: Self-pay | Admitting: Family

## 2015-04-16 VITALS — BP 115/80 | HR 84 | Temp 97.2°F | Ht 71.0 in | Wt 333.0 lb

## 2015-04-16 DIAGNOSIS — I2510 Atherosclerotic heart disease of native coronary artery without angina pectoris: Secondary | ICD-10-CM

## 2015-04-16 DIAGNOSIS — I10 Essential (primary) hypertension: Secondary | ICD-10-CM

## 2015-04-16 DIAGNOSIS — G4733 Obstructive sleep apnea (adult) (pediatric): Secondary | ICD-10-CM | POA: Diagnosis not present

## 2015-04-16 DIAGNOSIS — I5021 Acute systolic (congestive) heart failure: Secondary | ICD-10-CM

## 2015-04-16 DIAGNOSIS — E559 Vitamin D deficiency, unspecified: Secondary | ICD-10-CM | POA: Diagnosis not present

## 2015-04-16 DIAGNOSIS — E785 Hyperlipidemia, unspecified: Secondary | ICD-10-CM | POA: Diagnosis not present

## 2015-04-16 DIAGNOSIS — E118 Type 2 diabetes mellitus with unspecified complications: Secondary | ICD-10-CM

## 2015-04-16 DIAGNOSIS — E1165 Type 2 diabetes mellitus with hyperglycemia: Secondary | ICD-10-CM

## 2015-04-16 DIAGNOSIS — IMO0002 Reserved for concepts with insufficient information to code with codable children: Secondary | ICD-10-CM

## 2015-04-16 DIAGNOSIS — F4323 Adjustment disorder with mixed anxiety and depressed mood: Secondary | ICD-10-CM | POA: Diagnosis not present

## 2015-04-16 LAB — POCT GLYCOSYLATED HEMOGLOBIN (HGB A1C): HEMOGLOBIN A1C: 6.8

## 2015-04-16 NOTE — Patient Instructions (Signed)

## 2015-04-16 NOTE — Progress Notes (Signed)
Subjective:    Patient ID: Mark Leach, male    DOB: [birth year], 44 y.o.   MRN: [id number]  Pt present to the office today for chronic follow up.  Diabetes He presents for his follow-up diabetic visit. He has type 2 diabetes mellitus. His disease course has been worsening. Hypoglycemia symptoms include nervousness/anxiousness. Pertinent negatives for hypoglycemia include no confusion, headaches or mood changes. Pertinent negatives for diabetes include no blurred vision, no foot paresthesias, no foot ulcerations and no visual change. Pertinent negatives for hypoglycemia complications include no blackouts and no hospitalization. Symptoms are worsening. Diabetic complications include a CVA and heart disease. Pertinent negatives for diabetic complications include no nephropathy or peripheral neuropathy. Risk factors for coronary artery disease include diabetes mellitus, dyslipidemia, family history, hypertension, male sex, obesity and sedentary lifestyle. Current diabetic treatment includes insulin injections and oral agent (monotherapy). He is compliant with treatment all of the time. His weight is stable. He is following a generally unhealthy diet. His breakfast blood glucose range is generally 110-130 mg/dl. An ACE inhibitor/angiotensin II receptor blocker is being taken. Eye exam is current.  Hypertension This is a chronic problem. The current episode started more than 1 year ago. The problem has been resolved since onset. The problem is controlled. Associated symptoms include anxiety and peripheral edema ("just a little"). Pertinent negatives include no blurred vision, headaches, palpitations or shortness of breath. Risk factors for coronary artery disease include diabetes mellitus, dyslipidemia, male gender and obesity. Past treatments include ACE inhibitors and beta blockers. The current treatment provides mild improvement. Hypertensive end-organ damage includes CAD/MI, CVA and heart failure. There  is no history of kidney disease or a thyroid problem. There is no history of sleep apnea.  Hyperlipidemia This is a chronic problem. The current episode started more than 1 year ago. The problem is controlled. Recent lipid tests were reviewed and are normal. Exacerbating diseases include diabetes and obesity. He has no history of hypothyroidism. Pertinent negatives include no leg pain, myalgias or shortness of breath. Current antihyperlipidemic treatment includes statins. The current treatment provides significant improvement of lipids. Risk factors for coronary artery disease include diabetes mellitus, dyslipidemia, family history, hypertension, male sex, obesity and a sedentary lifestyle.  Anxiety Presents for follow-up visit. Symptoms include depressed mood, excessive worry and nervous/anxious behavior. Patient reports no confusion, insomnia, irritability, palpitations, panic or shortness of breath. Symptoms occur occasionally. The severity of symptoms is moderate. The symptoms are aggravated by family issues. The quality of sleep is good.   His past medical history is significant for anxiety/panic attacks and depression. There is no history of CAD. Past treatments include SSRIs. Compliance with prior treatments has been good.      Review of Systems  Constitutional: Negative.  Negative for irritability.  HENT: Negative.   Eyes: Negative for blurred vision.  Respiratory: Negative.  Negative for shortness of breath.   Cardiovascular: Negative.  Negative for palpitations.  Gastrointestinal: Negative.   Endocrine: Negative.   Genitourinary: Negative.   Musculoskeletal: Negative.  Negative for myalgias.  Neurological: Negative.  Negative for headaches.  Hematological: Negative.   Psychiatric/Behavioral: Negative for confusion. The patient is nervous/anxious. The patient does not have insomnia.   All other systems reviewed and are negative.      Objective:   Physical Exam  Constitutional:  He is oriented to person, place, and time. He appears well-developed and well-nourished. No distress.  HENT:  Head: Normocephalic.  Right Ear: External ear normal.  Left Ear: External ear normal.  Mouth/Throat: Oropharynx is clear and moist.  Eyes: Pupils are equal, round, and reactive to light. Right eye exhibits no discharge. Left eye exhibits no discharge.  Neck: Normal range of motion. Neck supple. No thyromegaly present.  Cardiovascular: Normal rate, regular rhythm, normal heart sounds and intact distal pulses.   No murmur heard. Pulmonary/Chest: Effort normal and breath sounds normal. No respiratory distress. He has no wheezes.  Abdominal: Soft. Bowel sounds are normal. He exhibits no distension. There is no tenderness.  Musculoskeletal: Normal range of motion. He exhibits edema (2+ in bilateral ankles). He exhibits no tenderness.  Neurological: He is alert and oriented to person, place, and time. He has normal reflexes. No cranial nerve deficit.  Skin: Skin is warm and dry. No rash noted. No erythema.  Psychiatric: He has a normal mood and affect. His behavior is normal. Judgment and thought content normal.  Vitals reviewed.     BP 115/80 mmHg  Pulse 84  Temp(Src) 97.2 F (36.2 C) (Oral)  Ht 5' 11"  (1.803 m)  Wt 333 lb (151.048 kg)  BMI 46.46 kg/m2     Assessment & Plan:  1. Coronary artery disease involving native coronary artery of native heart without angina pectoris - CMP14+EGFR  2. Essential hypertension - CMP14+EGFR  3. Obstructive sleep apnea - CMP14+EGFR  4. Diabetes mellitus type 2, uncontrolled, with complications - IAR42+TMBF - POCT glycosylated hemoglobin (Hb A1C)  5. Adjustment disorder with mixed anxiety and depressed mood - CMP14+EGFR  6. Hyperlipidemia - CMP14+EGFR  7. Morbid obesity - CMP14+EGFR  8. Vitamin D deficiency - CMP14+EGFR - Vit D  25 hydroxy (rtn osteoporosis monitoring)  9. Acute systolic congestive heart failure -  CMP14+EGFR   Continue all meds Labs pending Health Maintenance reviewed Diet and exercise encouraged RTO 3 months   Evelina Dun, FNP

## 2015-04-17 LAB — CMP14+EGFR
ALK PHOS: 169 IU/L — AB (ref 39–117)
ALT: 53 IU/L — ABNORMAL HIGH (ref 0–44)
AST: 34 IU/L (ref 0–40)
Albumin/Globulin Ratio: 1.4 (ref 1.1–2.5)
Albumin: 4.2 g/dL (ref 3.5–5.5)
BILIRUBIN TOTAL: 1.3 mg/dL — AB (ref 0.0–1.2)
BUN / CREAT RATIO: 19 (ref 9–20)
BUN: 19 mg/dL (ref 6–24)
CHLORIDE: 96 mmol/L — AB (ref 97–108)
CO2: 27 mmol/L (ref 18–29)
Calcium: 9.7 mg/dL (ref 8.7–10.2)
Creatinine, Ser: 1.01 mg/dL (ref 0.76–1.27)
GFR calc non Af Amer: 91 mL/min/{1.73_m2} (ref >59)
GFR, EST AFRICAN AMERICAN: 105 mL/min/{1.73_m2} (ref >59)
Globulin, Total: 3.1 g/dL (ref 1.5–4.5)
Glucose: 152 mg/dL — ABNORMAL HIGH (ref 65–99)
POTASSIUM: 4.8 mmol/L (ref 3.5–5.2)
SODIUM: 139 mmol/L (ref 134–144)
Total Protein: 7.3 g/dL (ref 6.0–8.5)

## 2015-04-17 LAB — VITAMIN D 25 HYDROXY (VIT D DEFICIENCY, FRACTURES): Vit D, 25-Hydroxy: 42.5 ng/mL (ref 30.0–100.0)

## 2015-04-28 ENCOUNTER — Ambulatory Visit (INDEPENDENT_AMBULATORY_CARE_PROVIDER_SITE_OTHER): Payer: 59 | Admitting: Cardiology

## 2015-04-28 ENCOUNTER — Encounter: Payer: Self-pay | Admitting: Cardiology

## 2015-04-28 VITALS — BP 142/90 | HR 66 | Ht 71.0 in | Wt 344.1 lb

## 2015-04-28 DIAGNOSIS — I10 Essential (primary) hypertension: Secondary | ICD-10-CM

## 2015-04-28 DIAGNOSIS — I2510 Atherosclerotic heart disease of native coronary artery without angina pectoris: Secondary | ICD-10-CM

## 2015-04-28 DIAGNOSIS — I5022 Chronic systolic (congestive) heart failure: Secondary | ICD-10-CM | POA: Diagnosis not present

## 2015-04-28 NOTE — Progress Notes (Signed)
Patient ID: Mark Leach, male   DOB: [birth year], 44 y.o.   MRN: [id number]     Clinical Summary Mr. Groman is a 44 y.o.male seen today for   1. CAD/Chronic systolic HF/ICM - hx of prior anterior STEMI in April 2014 at [facility], received stent to LAD - admit Jan 2016 with MI, cath severe CAD as described below with LVEF by LV gram 25% referred for CABG - TEE LVEF 25-30% - s/p CABG Jan 2016 (LIMA-LAD, SVG-diag, SVG-PDA,SVG-OM) - seen by EP 02/2015 for ICD evaluation, patient requested to hold off at that time.  - reports swelling has come down since last visit, however weight remains stable. He associates that with poor eating habits/high calorie intake.  - denies any SOB or DOE   2. OSA - started on CPAP, followed by pulmonary  Past Medical History  Diagnosis Date  . Coronary artery disease 01/2013    anterior MI with cath showing 95% pLAD, 90% diag, 30% OM1, 70% mRCA, 60% dRCA s/p PCI with DES to LAD and diag and EF 40%  . Ischemic dilated cardiomyopathy   . Hypertension   . Morbid obesity   . Chronic systolic CHF (congestive heart failure)   . Visual field defect 10/12/2014  . Diabetes mellitus type 2, uncontrolled, with complications   . Stroke 10/12/2014    Patient with several month h/o left sided visual field deficit and MRI of brain revealing:  1. Medial right occipital lobe subacute infarct. 2. At least 3 punctate areas of acute nonhemorrhagic infarct are present involving the left thalamus, high posterior right frontal lobe and high a medial posterior left frontal or parietal lobe. 3. Additional white matter changes are noted beyond the areas of  . 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 DJD (degenerative joint disease)     knees     Allergies  Allergen Reactions  . Nitroglycerin Other (See Comments)    Hypotension, syncope, bradycardia     Current Outpatient Prescriptions  Medication Sig Dispense Refill    . aspirin 81 MG tablet Take 81 mg by mouth daily.    Marland Kitchen atorvastatin (LIPITOR) 80 MG tablet Take 1 tablet (80 mg total) by mouth daily at 6 PM. 30 tablet 6  . citalopram (CELEXA) 40 MG tablet Take 1 tablet (40 mg total) by mouth at bedtime. 30 tablet 2  . furosemide (LASIX) 20 MG tablet 1 tab daily as needed for swelling or shortness of breath 90 tablet 3  . glucose blood test strip Use as instructed 100 each 12  . hydrochlorothiazide (HYDRODIURIL) 25 MG tablet Take 1 tablet (25 mg total) by mouth daily. 90 tablet 3  . Insulin Detemir (LEVEMIR) 100 UNIT/ML Pen Inject 50 Units into the skin daily at 10 pm. (Patient taking differently: Inject 50 Units into the skin daily at 10 pm. Sometimes uses levemir pen) 15 mL 11  . Insulin Pen Needle 31G X 8 MM MISC Use every night at bedtime 100 each 11  . insulin starter kit- pen needles MISC 1 kit by Other route once. 1 kit 10  . insulin starter kit- syringes MISC 1 kit by Other route once. 1 kit 10  . Insulin Syringes, Disposable, U-100 1 ML MISC Use once daily 60 each 11  . losartan (COZAAR) 100 MG tablet Take 1 tablet (100 mg total) by mouth daily. 90 tablet 3  .  metFORMIN (GLUCOPHAGE) 500 MG tablet Take 1 tablet (500 mg total) by mouth 2 (two) times daily with a meal. 180 tablet 1  . metoprolol succinate (TOPROL-XL) 100 MG 24 hr tablet Take 200 mg daily. (Patient taking differently: Take 200 mg by mouth daily.) 60 tablet 3  . Misc Natural Products (BLACK CHERRY CONCENTRATE PO) Take 1 tablet by mouth daily as needed (gout flare-up).     Marland Kitchen spironolactone (ALDACTONE) 25 MG tablet Take 0.5 tablets (12.5 mg total) by mouth daily. 90 tablet 3  . Vitamin D, Ergocalciferol, (DRISDOL) 50000 UNITS CAPS capsule Take 1 capsule (50,000 Units total) by mouth every 7 (seven) days. 12 capsule 3   No current facility-administered medications for this visit.     Past Surgical History  Procedure Laterality Date  . Cardiac catheterization  01/2013    [facility] in  [place]  . Coronary angioplasty  01/2013    PCI with stenting of LAD  . Cholecystectomy    . Left heart cath N/A 10/10/2014    Procedure: LEFT HEART CATH;  Surgeon: Troy Sine, MD;  Location[facility] CATH LAB;  Service: Cardiovascular;  Laterality: N/A;  . Coronary artery bypass graft N/A 10/13/2014    Procedure: CORONARY ARTERY BYPASS GRAFTING (CABG), ON PUMP, TIMES FOUR, USING LEFT INTERNAL MAMMARY ARTERY, BILATERAL GREATER SAPHENOUS VEINS HARVESTED ENDOSCOPICALLY;  Surgeon: Rexene Alberts, MD;  Location: [place];  Service: Open Heart Surgery;  Laterality: N/A;  . Multiple extractions with alveoloplasty N/A 10/22/2014    Procedure: Extraction of tooth #'s 1,3,4,5,6,7,8,9,10,11,12,13,14,15,16,20,21,22,24, 25,26,27,28,29,30,32 with alveoloplasty.;  Surgeon: Lenn Cal, DDS;  Location: [place];  Service: Oral Surgery;  Laterality: N/A;     Allergies  Allergen Reactions  . Nitroglycerin Other (See Comments)    Hypotension, syncope, bradycardia      Family History  Problem Relation Age of Onset  . Heart attack Brother 81  . Diabetes Brother   . Arthritis Mother   . Asthma Mother   . Heart disease Maternal Uncle   . Heart disease Maternal Grandfather   . Alcohol abuse Maternal Grandfather      Social History Mr. Huitron reports that he quit smoking about 7 years ago. His smoking use included Cigarettes. He started smoking about 25 years ago. He has a 18 pack-year smoking history. He has never used smokeless tobacco. Mr. Gubbels reports that he does not drink alcohol.   Review of Systems CONSTITUTIONAL: No weight loss, fever, chills, weakness or fatigue.  HEENT: Eyes: No visual loss, blurred vision, double vision or yellow sclerae.No hearing loss, sneezing, congestion, runny nose or sore throat.  SKIN: No rash or itching.  CARDIOVASCULAR: per HPI RESPIRATORY: No shortness of breath, cough or sputum.  GASTROINTESTINAL: No anorexia, nausea, vomiting or diarrhea. No abdominal  pain or blood.  GENITOURINARY: No burning on urination, no polyuria NEUROLOGICAL: No headache, dizziness, syncope, paralysis, ataxia, numbness or tingling in the extremities. No change in bowel or bladder control.  MUSCULOSKELETAL: No muscle, back pain, joint pain or stiffness.  LYMPHATICS: No enlarged nodes. No history of splenectomy.  PSYCHIATRIC: No history of depression or anxiety.  ENDOCRINOLOGIC: No reports of sweating, cold or heat intolerance. No polyuria or polydipsia.  Marland Kitchen   Physical Examination Filed Vitals:   04/28/15 1403  BP: 142/90  Pulse: 66   Filed Vitals:   04/28/15 1403  Height: 5' 11"  (1.803 m)  Weight: 344 lb 1.9 oz (156.092 kg)    Gen: resting comfortably, no acute distress HEENT: no scleral  icterus, pupils equal round and reactive, no palptable cervical adenopathy,  CV: RRR, no m/r/g,no JVD Resp: Clear to auscultation bilaterally GI: abdomen is soft, non-tender, non-distended, normal bowel sounds, no hepatosplenomegaly MSK: extremities are warm, no edema.  Skin: warm, no rash Neuro:  no focal deficits Psych: appropriate affect   Diagnostic Studies 10/2014 Cath ANGIOGRAPHY:  Left main: Moderate size vessel which trifurcated into an LAD and intermediate vessel and left circumflex coronary artery.  LAD: The LAD was subtotally occluded at its ostium and there was diffuse 95-99% ostial proximal stenosis and then was totally occluded in the region of the first diagonal Kasey Hansell. There was a gap and then faint filling of a second diagonal [facility] Crutcher which had a stent and there was faint filling of the LAD beyond the diagonal vessel via collaterals.  Ramus Intermediate: Small caliber vessel free of significant disease.  Left circumflex: Large caliber vessel that gave rise to 2 major marginal branches and then in the posterior lateral like coronary artery. The first marginal Bunyan Brier was moderate size and had proximal 90% followed by 80% stenoses. A distal superior  Rylyn Zawistowski had 95% stenosis.  Right coronary artery: Moderate size vessel that had 95% mid stenosis and 80% distal stenosis in the region of the acute margin. The vessel supplied the PDA. There were septal collaterals to the LAD.   Left ventriculography revealed dilated ventricle with an ejection fraction of 25% with diffuse global hypokinesis   IMPRESSION:  Severe ischemic dilated cardiomyopathy with an ejection fraction approximately 25%.  Severe multivessel coronary obstructive disease with diffusely diseased subtotal occlusion of the ostial and proximal LAD with faint collaterals to the first and second diagonal vessel and more distal LAD; left circumflex coronary artery with tandem 90 and 80% obtuse marginal 1 stenosis with distal 95% stenosis in this distal superior Kestrel Mis of this marginal vessel; and 95% mid RCA stenosis with 80% stenosis in the region of the acute margin with evidence for septal collaterals from the PDA to the LAD.  RECOMMENDATION:  The patient has surgical anatomy and CABG revascularization surgery will be recommended with surgical consultation in the morning. The patient was started on Aggrastat post procedure to reduce likelihood of progressive thrombosis. An attempt was made to initiate low-dose IV nitroglycerin, but the patient transiently dropped his blood pressure and this was discontinued.  10/2014 TEE Study Conclusions  - Left ventricle: Septal apical and anterior wall hypokinesis The cavity size was severely dilated. Systolic function was severely reduced. The estimated ejection fraction was in the range of 25% to 30%. - Aortic valve: There was trivial regurgitation. - Left atrium: The atrium was mildly dilated. - Atrial septum: No defect or patent foramen ovale was identified. - Pericardium, extracardiac: Small pericardial effusion IVC flat no evidence of tamponade.    02/2015 Echo Study Conclusions  - Left ventricle: The cavity size was  mildly dilated. There was mild concentric hypertrophy. Systolic function was moderately to severely reduced. The estimated ejection fraction was in the range of 30% to 35%. Contrast enhancement utilized. Doppler parameters are consistent with abnormal left ventricular relaxation (grade 1 diastolic dysfunction). Doppler parameters are consistent with high ventricular filling pressure. - Regional wall motion abnormality: Akinesis of the mid anterior, basal-mid anteroseptal, apical septal, and apical myocardium; severe hypokinesis of the mid inferoseptal myocardium; moderate hypokinesis of the apical lateral myocardium; mild hypokinesis of the mid anterolateral myocardium. - Aortic valve: Mildly to moderately calcified annulus. - Aorta: Mild to moderate aortic root dilatation. Aortic root dimension:  44 mm (ED). - Mitral valve: Mildly calcified annulus. Mildly thickened leaflets . - Left atrium: The atrium was mildly dilated. Volume/bsa, ES, (1-plane Simpson&'s, A2C): 33.2 ml/m^2. - Right ventricle: Systolic function was mildly reduced.    Assessment and Plan  1. CAD/Chronic systolic HF/ICM - recent MI, found to have severe multivessel disease, s/p 4 vessel CABG Jan 2016. Echo 02/2015 LVEF 30-35% - he has elected to hold off on ICD for now, will continue to follow at this time - will start aldactone 12.83m daily, BMET in 2 weeks - volume status improved with more regular lasix, will continue  2. OSA  - continue CPAP  3. HTN - above goal based on his history of DM2. Starting aldactone, follow bp's.    F/u 3 months   JArnoldo Lenis M.D.

## 2015-04-28 NOTE — Patient Instructions (Signed)
Your physician recommends that you schedule a follow-up appointment in: 3 months with Dr. Wyline MoodBranch  Your physician recommends that you continue on your current medications as directed. Please refer to the Current Medication list given to you today.  Your physician recommends that you return for lab work in: 2 weeks bmp  [facility]

## 2015-05-08 ENCOUNTER — Other Ambulatory Visit: Payer: Self-pay | Admitting: Cardiology

## 2015-05-13 ENCOUNTER — Encounter: Payer: Self-pay | Admitting: *Deleted

## 2015-06-18 ENCOUNTER — Other Ambulatory Visit: Payer: Self-pay | Admitting: Physician Assistant

## 2015-06-19 ENCOUNTER — Other Ambulatory Visit: Payer: Self-pay

## 2015-06-19 MED ORDER — CITALOPRAM HYDROBROMIDE 40 MG PO TABS: 40.0000 mg | ORAL_TABLET | Freq: Every day | ORAL | Status: DC

## 2015-06-24 ENCOUNTER — Encounter: Payer: Self-pay | Admitting: Internal Medicine

## 2015-06-24 ENCOUNTER — Ambulatory Visit (INDEPENDENT_AMBULATORY_CARE_PROVIDER_SITE_OTHER): Payer: 59 | Admitting: Internal Medicine

## 2015-06-24 VITALS — BP 130/78 | HR 77 | Ht 71.0 in | Wt 353.2 lb

## 2015-06-24 DIAGNOSIS — I5043 Acute on chronic combined systolic (congestive) and diastolic (congestive) heart failure: Secondary | ICD-10-CM

## 2015-06-24 DIAGNOSIS — G4733 Obstructive sleep apnea (adult) (pediatric): Secondary | ICD-10-CM

## 2015-06-24 NOTE — Progress Notes (Signed)
02/23/15- 70 yoM former smoker referred courtesy of Mark Leach; never had sleep study. Had quad bypass last year and wants to check for OSA at the recommendation of his cardiologist, Dr. Wyline Mood. Wife is here. She reports husband was a loud snorer before his MI in January 2016, after which he lost weight. CABG at that time. Asthma as a child but denies lung disease as an adult. History of tonsillectomy. Unemployed. Usual schedule is bedtime between 10 and 11 PM, sleep latency 10 minutes, waking twice before up in the morning at no specified time. Brother with obstructive sleep apnea.  04/02/15- 64 yoM former smoker followed for OSA, complicated by CAD/ MI/ CABG/ CHF, DM2 Unattended Home Sleep study 03/25/15 Mild OSA, AHI 6.1/ hr, desat to 79%, weight 333 lbs. FOLLOWS FOR: review home sleep test.  pt has no complaints at this time. He insists he feels "fine" and denies daytime tiredness. Son uses CPAP. He slept on his back for his home study.  His cardiologist raised initial question of OSA and they have been considering implanted defibrillator.  06/24/15- 64 yoM former smoker followed for OSA, complicated by CAD/ MI, DM2 FOLLOWS FOR: DME is [facility]-no DL since set up-will need to order; Pts he wears CPAP auto all night every night; pressure works well for pt. Full face mask. He recognizes no change in sleep and says per wife, he hasn't snored as much since he had MI.   ROS-see HPI   Negative unless "+" Constitutional:    weight loss, night sweats, fevers, chills, fatigue, lassitude. HEENT:    headaches, difficulty swallowing, tooth/dental problems, sore throat,       sneezing, itching, ear ache, nasal congestion, post nasal drip, snoring CV:    chest pain, orthopnea, PND, swelling in lower extremities, anasarca,                                                  dizziness, palpitations Resp:  + shortness of breath with exertion or at rest.                +productive cough,    non-productive cough, coughing up of blood.              change in color of mucus.  wheezing.   Skin:    rash or lesions. GI:  No-   heartburn, indigestion, abdominal pain, nausea, vomiting,  GU: . MS:  + joint pain, stiffness,  Neuro-     nothing unusual Psych:  change in mood or affect.  +depression or anxiety.   memory loss.  OBJ- Physical Exam General- Alert, Oriented, Affect-appropriate, Distress- none acute, +obese, +passive affect Skin- rash-none, lesions- none, excoriation- none Lymphadenopathy- none Head- atraumatic            Eyes- Gross vision intact, PERRLA, conjunctivae and secretions clear            Ears- Hearing, canals-normal            Nose- Clear, no-Septal dev, mucus, polyps, erosion, perforation             Throat- Mallampati II , mucosa clear , drainage- none, tonsils- atrophic Neck- flexible , trachea midline, no stridor , thyroid nl, carotid no bruit Chest - symmetrical excursion , unlabored           Heart/CV- RRR ,  no murmur , no gallop  , no rub, nl s1 s2                           - JVD- none , edema+2, stasis changes- none, varices- none           Lung- clear to P&A, wheeze- none, cough- none , dullness-none, rub- none           Chest wall-  Abd-  Br/ Gen/ Rectal- Not done, not indicated Extrem- + stasis changes and scars on lower extremities, heavy legs Neuro- grossly intact to observation

## 2015-06-24 NOTE — Patient Instructions (Addendum)
Order- DME [facility]  Download CPAP for pressure compliance, enroll in [employer] if available    Dx OSA  Keep wearing your CPAP all night, every night to help your heart and reduce the risk of stroke  If you can lose some weight, you might get to a point where you no longer need CPAP.  Don't forget to get that flu shot from your family doctor

## 2015-06-24 NOTE — Assessment & Plan Note (Signed)
He continues management by cardiology. On lasix, but didn't know what it was for- knowledge deficit.

## 2015-06-24 NOTE — Assessment & Plan Note (Signed)
He has been compliant with CPAP, but doesn't feel different.  Plan- continue autoPAP while we request download.for pressure compliance documentation.

## 2015-07-06 ENCOUNTER — Encounter: Payer: Self-pay | Admitting: Nutrition

## 2015-07-06 ENCOUNTER — Encounter: Payer: 59 | Attending: Family | Admitting: Nutrition

## 2015-07-06 VITALS — Ht 71.0 in | Wt 350.0 lb

## 2015-07-06 DIAGNOSIS — IMO0002 Reserved for concepts with insufficient information to code with codable children: Secondary | ICD-10-CM

## 2015-07-06 DIAGNOSIS — Z794 Long term (current) use of insulin: Secondary | ICD-10-CM | POA: Diagnosis not present

## 2015-07-06 DIAGNOSIS — Z713 Dietary counseling and surveillance: Secondary | ICD-10-CM | POA: Insufficient documentation

## 2015-07-06 DIAGNOSIS — Z6841 Body Mass Index (BMI) 40.0 and over, adult: Secondary | ICD-10-CM | POA: Insufficient documentation

## 2015-07-06 DIAGNOSIS — E118 Type 2 diabetes mellitus with unspecified complications: Secondary | ICD-10-CM | POA: Insufficient documentation

## 2015-07-06 DIAGNOSIS — E1165 Type 2 diabetes mellitus with hyperglycemia: Secondary | ICD-10-CM

## 2015-07-06 NOTE — Patient Instructions (Signed)
Goals: 1. Follow  The Plate Method 2. Exercise 15 minutes indoors a day. 3. Lose 2 lbs per month. 4. Avoid fast foods.

## 2015-07-06 NOTE — Progress Notes (Signed)
  Medical Nutrition Therapy:  Appt start time: 1100 end time: 1130  Assessment:  Primary concerns today: Follow up Diabetes and Heart Attack. Most recent A1C 6.8% (7/16) up from 6.5% (2/16) after his heart attack.  Changes made: cut out a lot cheese, and eating more fresh fruits and vegetable. He has reduced his sodium intake. Still eating some fast foods.  Got dentures now and uses them some of time. Doesn't have them in today. Having pain in the bottom of his feet since yesterday after walking at [facility].  Lost 3 lbs  BS 168 mg/dl this am. Walking 2-3 times per week. Got lost when  he is walking around the block. His wife works 2 jobs and goes to school and not at home to walk with him. Willing to walk in the house or try and get a scholarship membership at [employer]. Has short term memory loss at times..  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 Hasn't been checking blood sugars. He notes he didn't know he was suppose to check his blood sugar. Meter brought in. Only 4 readings; 1 per month. Non compliant with testing.  Suppose to be taking 50 units of Levemir daily and 1000 mg of Metformin daily. Diet is still low in fresh fruits and vegetables and whole grains. Overeating at dinner meal causing blood sugar elevated in evening.  Preferred Learning Style:   Auditory  Visual  Hands on   Learning Readiness:     Ready  Change in progress  MEDICATIONS: See list   DIETARY INTAKE:  24-hr recall:  B ( AM): 1 cup pineapple, water Snk ( AM): none  L ( PM):  1 cup spinach, 1/2c low fat cottage cheese and 1 can of mini raveoli, water Snk ( PM):  D ( PM): Beef Gyro, Ruben and curly fry from [employer], Sugar free lemonade Snk ( PM): none Beverages: water  Usual physical activity: walking  Estimated energy needs: 1800 calories 200 g carbohydrates 135 g protein 50 g fat  Progress Towards Goal(s):  In progress.   Nutritional Diagnosis:  NB-1.1 Food and nutrition-related knowledge  deficient related to diabetes as evidenced by A1C  Of 6.5.    Intervention:  Nutrition Counseling on diet, meal planning, diabetes as a progressive diabetes, target ranges for blood sugars, testing of blood sugars, low fat low sodium high fiber diet and prevention of complications of DM and CAD.  Goals:  Choose grilled chicken options when eating out.       Increase fresh fruits and vegetables daily 3-5 servings per day..       Avoid processed meats and get meat sliced from deli.       Drink water only and no diet sodas or SF beverages.       Avoid fast foods.       Walk with someone and not by yourself.       Use only Mrs. Dash and other herbs and spices .       Lose 2-3 lbs per month  Teaching Method Utilized:  Visual Auditory Hands on  Handouts given during visit include: The Plate Method Carb Counting and Food Label handouts Meal Plan Card  Barriers to learning/adherence to lifestyle change: none  Demonstrated degree of understanding via:  Teach Back   Monitoring/Evaluation:  Dietary intake, exercise, meal planning, SBG and body weight in 3 month(s).

## 2015-07-20 ENCOUNTER — Ambulatory Visit: Payer: 59 | Admitting: Family

## 2015-07-21 ENCOUNTER — Encounter: Payer: Self-pay | Admitting: Family

## 2015-08-04 ENCOUNTER — Other Ambulatory Visit: Payer: Self-pay | Admitting: Family

## 2015-08-05 ENCOUNTER — Encounter: Payer: Self-pay | Admitting: Cardiology

## 2015-08-05 ENCOUNTER — Ambulatory Visit (INDEPENDENT_AMBULATORY_CARE_PROVIDER_SITE_OTHER): Payer: 59 | Admitting: Cardiology

## 2015-08-05 VITALS — BP 145/86 | HR 80 | Ht 71.0 in | Wt 363.8 lb

## 2015-08-05 DIAGNOSIS — I5022 Chronic systolic (congestive) heart failure: Secondary | ICD-10-CM | POA: Diagnosis not present

## 2015-08-05 DIAGNOSIS — Z23 Encounter for immunization: Secondary | ICD-10-CM | POA: Diagnosis not present

## 2015-08-05 DIAGNOSIS — I10 Essential (primary) hypertension: Secondary | ICD-10-CM | POA: Diagnosis not present

## 2015-08-05 DIAGNOSIS — I2510 Atherosclerotic heart disease of native coronary artery without angina pectoris: Secondary | ICD-10-CM

## 2015-08-05 MED ORDER — FUROSEMIDE 20 MG PO TABS: 20.0000 mg | ORAL_TABLET | Freq: Every day | ORAL | Status: DC

## 2015-08-05 NOTE — Patient Instructions (Signed)
Your physician recommends that you schedule a follow-up appointment in: 3-4 WEEKS WITH DR. BRANCH  Your physician has recommended you make the following change in your medication:   STOP HCTZ  START LASIX 20 MG DAILY  PLEASE CALL US Monday WITH AN UPDATE ON YOUR WEIGHT   Your physician recommends that you return for lab work in: 2 WEEKS BMP/MG  Thank you for choosing [facility]!!

## 2015-08-05 NOTE — Progress Notes (Signed)
Patient ID: Mark Leach, male   DOB: 16-Oct-1970, 44 y.o.   MRN: [id number]     Clinical Summary [name] is a 44 y.o.male seen today for follow up of the following medical problems.   1. CAD/Chronic systolic HF/ICM - hx of prior anterior STEMI in April 2014 at [facility], received stent to LAD - admit Jan 2016 with MI, cath severe CAD as described below with LVEF by LV gram 25% referred for CABG - TEE LVEF 25-30% - s/p CABG Jan 2016 (LIMA-LAD, SVG-diag, SVG-PDA,SVG-OM) - seen by EP 02/2015 for ICD evaluation, patient requested to hold off at that time.   Not taking lasix regularly. Not taking HCTZ, reports gout flares. Significant weight gain and increased edema since last visit.     2. OSA - started on CPAP, followed by pulmonary Past Medical History  Diagnosis Date  . Coronary artery disease 01/2013    anterior MI with cath showing 95% pLAD, 90% diag, 30% OM1, 70% mRCA, 60% dRCA s/p PCI with DES to LAD and diag and EF 40%  . Ischemic dilated cardiomyopathy   . Hypertension   . Morbid obesity   . Chronic systolic CHF (congestive heart failure)   . Visual field defect 10/12/2014  . Diabetes mellitus type 2, uncontrolled, with complications   . Stroke 10/12/2014    Patient with several month h/o left sided visual field deficit and MRI of brain revealing:  1. Medial right occipital lobe subacute infarct. 2. At least 3 punctate areas of acute nonhemorrhagic infarct are present involving the left thalamus, high posterior right frontal lobe and high a medial posterior left frontal or parietal lobe. 3. Additional white matter changes are noted beyond the areas of  . 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 DJD (degenerative joint disease)     knees     Allergies  Allergen Reactions  . Nitroglycerin Other (See Comments)    Hypotension, syncope, bradycardia     Current Outpatient Prescriptions  Medication Sig Dispense Refill   . aspirin 81 MG tablet Take 81 mg by mouth daily.    Marland Kitchen atorvastatin (LIPITOR) 80 MG tablet Take 1 tablet (80 mg total) by mouth daily at 6 PM. 30 tablet 6  . citalopram (CELEXA) 40 MG tablet TAKE ONE TABLET BY MOUTH AT BEDTIME 30 tablet 0  . furosemide (LASIX) 20 MG tablet 1 tab daily as needed for swelling or shortness of breath 90 tablet 3  . glucose blood test strip Use as instructed 100 each 12  . hydrochlorothiazide (HYDRODIURIL) 25 MG tablet Take 1 tablet (25 mg total) by mouth daily. 90 tablet 3  . Insulin Detemir (LEVEMIR) 100 UNIT/ML Pen Inject 50 Units into the skin daily at 10 pm. (Patient taking differently: Inject 50 Units into the skin daily at 10 pm. Sometimes uses levemir pen) 15 mL 11  . Insulin Pen Needle 31G X 8 MM MISC Use every night at bedtime 100 each 11  . insulin starter kit- pen needles MISC 1 kit by Other route once. 1 kit 10  . insulin starter kit- syringes MISC 1 kit by Other route once. 1 kit 10  . Insulin Syringes, Disposable, U-100 1 ML MISC Use once daily 60 each 11  . losartan (COZAAR) 100 MG tablet Take 1 tablet (100 mg total) by mouth daily. 90 tablet 3  . metFORMIN (GLUCOPHAGE) 500 MG tablet  Take 1 tablet (500 mg total) by mouth 2 (two) times daily with a meal. 180 tablet 1  . metoprolol (TOPROL-XL) 200 MG 24 hr tablet TAKE 1 TABLET ONCE DAILY. 30 tablet 3  . Misc Natural Products (BLACK CHERRY CONCENTRATE PO) Take 1 tablet by mouth daily as needed (gout flare-up).     Marland Kitchen spironolactone (ALDACTONE) 25 MG tablet Take 0.5 tablets (12.5 mg total) by mouth daily. 90 tablet 3  . Vitamin D, Ergocalciferol, (DRISDOL) 50000 UNITS CAPS capsule Take 1 capsule (50,000 Units total) by mouth every 7 (seven) days. 12 capsule 3   No current facility-administered medications for this visit.     Past Surgical History  Procedure Laterality Date  . Cardiac catheterization  01/2013    [facility] in [place]  . Coronary angioplasty  01/2013    PCI with stenting of LAD  .  Cholecystectomy    . Left heart cath N/A 10/10/2014    Procedure: LEFT HEART CATH;  Surgeon: Troy Sine, MD;  Location: [facility] CATH LAB;  Service: Cardiovascular;  Laterality: N/A;  . Coronary artery bypass graft N/A 10/13/2014    Procedure: CORONARY ARTERY BYPASS GRAFTING (CABG), ON PUMP, TIMES FOUR, USING LEFT INTERNAL MAMMARY ARTERY, BILATERAL GREATER SAPHENOUS VEINS HARVESTED ENDOSCOPICALLY;  Surgeon: Rexene Alberts, MD;  Location: [place];  Service: Open Heart Surgery;  Laterality: N/A;  . Multiple extractions with alveoloplasty N/A 10/22/2014    Procedure: Extraction of tooth #'s 1,3,4,5,6,7,8,9,10,11,12,13,14,15,16,20,21,22,24, 25,26,27,28,29,30,32 with alveoloplasty.;  Surgeon: Lenn Cal, DDS;  Location: [place];  Service: Oral Surgery;  Laterality: N/A;     Allergies  Allergen Reactions  . Nitroglycerin Other (See Comments)    Hypotension, syncope, bradycardia      Family History  Problem Relation Age of Onset  . Heart attack Brother 25  . Diabetes Brother   . Arthritis Mother   . Asthma Mother   . Heart disease Maternal Uncle   . Heart disease Maternal Grandfather   . Alcohol abuse Maternal Grandfather      Social History [name] reports that he quit smoking about 7 years ago. His smoking use included Cigarettes. He started smoking about 25 years ago. He has a 18 pack-year smoking history. He has never used smokeless tobacco. [name] reports that he does not drink alcohol.   Review of Systems CONSTITUTIONAL: No weight loss, fever, chills, weakness or fatigue.  HEENT: Eyes: No visual loss, blurred vision, double vision or yellow sclerae.No hearing loss, sneezing, congestion, runny nose or sore throat.  SKIN: No rash or itching.  CARDIOVASCULAR: per HPI RESPIRATORY: No shortness of breath, cough or sputum.  GASTROINTESTINAL: No anorexia, nausea, vomiting or diarrhea. No abdominal pain or blood.  GENITOURINARY: No burning on urination, no polyuria NEUROLOGICAL:  No headache, dizziness, syncope, paralysis, ataxia, numbness or tingling in the extremities. No change in bowel or bladder control.  MUSCULOSKELETAL: No muscle, back pain, joint pain or stiffness.  LYMPHATICS: No enlarged nodes. No history of splenectomy.  PSYCHIATRIC: No history of depression or anxiety.  ENDOCRINOLOGIC: No reports of sweating, cold or heat intolerance. No polyuria or polydipsia.  Marland Kitchen   Physical Examination Filed Vitals:   08/05/15 1502  BP: 145/86  Pulse: 80   Filed Vitals:   08/05/15 1502  Height: $Remove'5\' 11"'CTypnSF$  (1.803 m)  Weight: 363 lb 12.8 oz (165.019 kg)    Gen: resting comfortably, no acute distress HEENT: no scleral icterus, pupils equal round and reactive, no palptable cervical adenopathy,  CV: RRR, no  m/r/g, no jvd. Right carotid bruit Resp: Clear to auscultation bilaterally GI: abdomen is soft, non-tender, non-distended, normal bowel sounds, no hepatosplenomegaly MSK: extremities are warm, 2+ bilateral LE edema Skin: warm, no rash Neuro:  no focal deficits Psych: appropriate affect   Diagnostic Studies 10/2014 Cath ANGIOGRAPHY:  Left main: Moderate size vessel which trifurcated into an LAD and intermediate vessel and left circumflex coronary artery.  LAD: The LAD was subtotally occluded at its ostium and there was diffuse 95-99% ostial proximal stenosis and then was totally occluded in the region of the first diagonal Mida Cory. There was a gap and then faint filling of a second diagonal Quintel Mccalla which had a stent and there was faint filling of the LAD beyond the diagonal vessel via collaterals.  Ramus Intermediate: Small caliber vessel free of significant disease.  Left circumflex: Large caliber vessel that gave rise to 2 major marginal branches and then in the posterior lateral like coronary artery. The first marginal Jorden Minchey was moderate size and had proximal 90% followed by 80% stenoses. A distal superior Nasira Janusz had 95% stenosis.  Right coronary artery:  Moderate size vessel that had 95% mid stenosis and 80% distal stenosis in the region of the acute margin. The vessel supplied the PDA. There were septal collaterals to the LAD.   Left ventriculography revealed dilated ventricle with an ejection fraction of 25% with diffuse global hypokinesis   IMPRESSION:  Severe ischemic dilated cardiomyopathy with an ejection fraction approximately 25%.  Severe multivessel coronary obstructive disease with diffusely diseased subtotal occlusion of the ostial and proximal LAD with faint collaterals to the first and second diagonal vessel and more distal LAD; left circumflex coronary artery with tandem 90 and 80% obtuse marginal 1 stenosis with distal 95% stenosis in this distal superior Samone Guhl of this marginal vessel; and 95% mid RCA stenosis with 80% stenosis in the region of the acute margin with evidence for septal collaterals from the PDA to the LAD.  RECOMMENDATION:  The patient has surgical anatomy and CABG revascularization surgery will be recommended with surgical consultation in the morning. The patient was started on Aggrastat post procedure to reduce likelihood of progressive thrombosis. An attempt was made to initiate low-dose IV nitroglycerin, but the patient transiently dropped his blood pressure and this was discontinued.  10/2014 TEE Study Conclusions  - Left ventricle: Septal apical and anterior wall hypokinesis The cavity size was severely dilated. Systolic function was severely reduced. The estimated ejection fraction was in the range of 25% to 30%. - Aortic valve: There was trivial regurgitation. - Left atrium: The atrium was mildly dilated. - Atrial septum: No defect or patent foramen ovale was identified. - Pericardium, extracardiac: Small pericardial effusion IVC flat no evidence of tamponade.    02/2015 Echo Study Conclusions  - Left ventricle: The cavity size was mildly dilated. There was mild concentric  hypertrophy. Systolic function was moderately to severely reduced. The estimated ejection fraction was in the range of 30% to 35%. Contrast enhancement utilized. Doppler parameters are consistent with abnormal left ventricular relaxation (grade 1 diastolic dysfunction). Doppler parameters are consistent with high ventricular filling pressure. - Regional wall motion abnormality: Akinesis of the mid anterior, basal-mid anteroseptal, apical septal, and apical myocardium; severe hypokinesis of the mid inferoseptal myocardium; moderate hypokinesis of the apical lateral myocardium; mild hypokinesis of the mid anterolateral myocardium. - Aortic valve: Mildly to moderately calcified annulus. - Aorta: Mild to moderate aortic root dilatation. Aortic root dimension: 44 mm (ED). - Mitral valve: Mildly calcified annulus.  Mildly thickened leaflets . - Left atrium: The atrium was mildly dilated. Volume/bsa, ES, (1-plane Simpson&'s, A2C): 33.2 ml/m^2. - Right ventricle: Systolic function was mildly reduced.    Assessment and Plan   1. CAD/Chronic systolic HF/ICM - recent MI, found to have severe multivessel disease, s/p 4 vessel CABG Jan 2016. Echo 02/2015 LVEF 30-35% - he has elected to hold off on ICD for now, will continue to follow at this time - significantly increased weight and edema due to noncompliance with diuretic. He will take lasix $RemoveBe'20mg'[id number]$  daily and call us Monday with update on weight and swelling. Check BMET and Mg in 2 weeks. We will stop his HCTZ as he states this makes his gout flare up.   2. OSA  - continue CPAP  3. HTN - above goal based on his history of DM2. Will follow as he diuresis.     F/u 3-4 weeks.   Arnoldo Lenis, M.D.

## 2015-08-06 ENCOUNTER — Telehealth: Payer: Self-pay | Admitting: *Deleted

## 2015-08-06 NOTE — Telephone Encounter (Signed)
Thayer Ohmhris, RN from [facility] home [facility] called as Lorain ChildesFYI that he was also following pt for heart failure and recording weights and meds.

## 2015-08-07 ENCOUNTER — Ambulatory Visit (INDEPENDENT_AMBULATORY_CARE_PROVIDER_SITE_OTHER): Payer: 59 | Admitting: Family

## 2015-08-07 ENCOUNTER — Encounter: Payer: Self-pay | Admitting: Family

## 2015-08-07 ENCOUNTER — Ambulatory Visit: Payer: Self-pay | Admitting: Nutrition

## 2015-08-07 VITALS — BP 140/81 | HR 88 | Temp 97.5°F | Ht 71.0 in | Wt 363.0 lb

## 2015-08-07 DIAGNOSIS — E559 Vitamin D deficiency, unspecified: Secondary | ICD-10-CM

## 2015-08-07 DIAGNOSIS — E1165 Type 2 diabetes mellitus with hyperglycemia: Secondary | ICD-10-CM

## 2015-08-07 DIAGNOSIS — I2510 Atherosclerotic heart disease of native coronary artery without angina pectoris: Secondary | ICD-10-CM | POA: Diagnosis not present

## 2015-08-07 DIAGNOSIS — I5021 Acute systolic (congestive) heart failure: Secondary | ICD-10-CM | POA: Diagnosis not present

## 2015-08-07 DIAGNOSIS — Z23 Encounter for immunization: Secondary | ICD-10-CM

## 2015-08-07 DIAGNOSIS — Z794 Long term (current) use of insulin: Secondary | ICD-10-CM | POA: Diagnosis not present

## 2015-08-07 DIAGNOSIS — F4323 Adjustment disorder with mixed anxiety and depressed mood: Secondary | ICD-10-CM | POA: Diagnosis not present

## 2015-08-07 DIAGNOSIS — G4733 Obstructive sleep apnea (adult) (pediatric): Secondary | ICD-10-CM | POA: Diagnosis not present

## 2015-08-07 DIAGNOSIS — Z951 Presence of aortocoronary bypass graft: Secondary | ICD-10-CM | POA: Diagnosis not present

## 2015-08-07 DIAGNOSIS — I10 Essential (primary) hypertension: Secondary | ICD-10-CM | POA: Diagnosis not present

## 2015-08-07 DIAGNOSIS — E785 Hyperlipidemia, unspecified: Secondary | ICD-10-CM

## 2015-08-07 DIAGNOSIS — E118 Type 2 diabetes mellitus with unspecified complications: Secondary | ICD-10-CM

## 2015-08-07 DIAGNOSIS — IMO0002 Reserved for concepts with insufficient information to code with codable children: Secondary | ICD-10-CM

## 2015-08-07 LAB — POCT GLYCOSYLATED HEMOGLOBIN (HGB A1C): Hemoglobin A1C: 8

## 2015-08-07 NOTE — Progress Notes (Signed)
Subjective:    Patient ID: Mark Leach, male    DOB: June 23, 1971, 44 y.o.   MRN: [id number]   Pt presents to the office today for chronic follow up. Pt is followed by his Cardiologists every 3 months. Pt saw Cardiologist on 08/05/15 and had his lasix increased and told to follow up in 3-4 weeks. Pt had been noncompliant on taking lasix regularly and had a significant weight gain. Pt is to take lasix 20 mg daily and 20 mg of lasix as needed for weight gain.  Hypertension This is a chronic problem. The current episode started more than 1 year ago. The problem has been resolved since onset. The problem is controlled. Associated symptoms include anxiety and peripheral edema ("just a little"). Pertinent negatives include no blurred vision, headaches, palpitations or shortness of breath. Risk factors for coronary artery disease include diabetes mellitus, dyslipidemia, male gender and obesity. Past treatments include ACE inhibitors and beta blockers. The current treatment provides mild improvement. Hypertensive end-organ damage includes CAD/MI, CVA and heart failure. There is no history of kidney disease or a thyroid problem. There is no history of sleep apnea.  Diabetes He presents for his follow-up diabetic visit. He has type 2 diabetes mellitus. His disease course has been worsening. Pertinent negatives for hypoglycemia include no confusion, headaches or mood changes. Pertinent negatives for diabetes include no blurred vision, no foot paresthesias, no foot ulcerations and no visual change. Pertinent negatives for hypoglycemia complications include no blackouts and no hospitalization. Symptoms are worsening. Diabetic complications include a CVA and heart disease. Pertinent negatives for diabetic complications include no nephropathy or peripheral neuropathy. Risk factors for coronary artery disease include diabetes mellitus, dyslipidemia, family history, hypertension, male sex, obesity and sedentary lifestyle.  Current diabetic treatment includes insulin injections and oral agent (monotherapy). He is compliant with treatment all of the time. His weight is stable. He is following a generally unhealthy diet. His breakfast blood glucose range is generally 180-200 mg/dl. An ACE inhibitor/angiotensin II receptor blocker is being taken. Eye exam is current.  Hyperlipidemia This is a chronic problem. The current episode started more than 1 year ago. The problem is controlled. Recent lipid tests were reviewed and are normal. Exacerbating diseases include diabetes and obesity. He has no history of hypothyroidism. Pertinent negatives include no leg pain, myalgias or shortness of breath. Current antihyperlipidemic treatment includes statins. The current treatment provides significant improvement of lipids. Risk factors for coronary artery disease include diabetes mellitus, dyslipidemia, family history, hypertension, male sex, obesity and a sedentary lifestyle.  Anxiety Presents for follow-up visit. Symptoms include depressed mood and excessive worry. Patient reports no confusion, insomnia, irritability, palpitations, panic or shortness of breath. Symptoms occur occasionally. The severity of symptoms is moderate. The symptoms are aggravated by family issues. The quality of sleep is good.   His past medical history is significant for anxiety/panic attacks and depression. There is no history of CAD. Past treatments include SSRIs. Compliance with prior treatments has been good.      Review of Systems  Constitutional: Negative.  Negative for irritability.  HENT: Negative.   Eyes: Negative for blurred vision.  Respiratory: Negative.  Negative for shortness of breath.   Cardiovascular: Negative.  Negative for palpitations.  Gastrointestinal: Negative.   Endocrine: Negative.   Genitourinary: Negative.   Musculoskeletal: Negative.  Negative for myalgias.  Neurological: Negative.  Negative for headaches.  Hematological:  Negative.   Psychiatric/Behavioral: Negative.  Negative for confusion. The patient does not have insomnia.  All other systems reviewed and are negative.      Objective:   Physical Exam  Constitutional: He is oriented to person, place, and time. He appears well-developed and well-nourished. No distress.  HENT:  Head: Normocephalic.  Right Ear: External ear normal.  Left Ear: External ear normal.  Mouth/Throat: Oropharynx is clear and moist.  Eyes: Pupils are equal, round, and reactive to light. Right eye exhibits no discharge. Left eye exhibits no discharge.  Neck: Normal range of motion. Neck supple. No thyromegaly present.  Cardiovascular: Normal rate, regular rhythm, normal heart sounds and intact distal pulses.   No murmur heard. Pulmonary/Chest: Effort normal and breath sounds normal. No respiratory distress. He has no wheezes.  Abdominal: Soft. Bowel sounds are normal. He exhibits no distension. There is no tenderness.  Musculoskeletal: Normal range of motion. He exhibits edema (2+ in BLE). He exhibits no tenderness.  Neurological: He is alert and oriented to person, place, and time. He has normal reflexes. No cranial nerve deficit.  Skin: Skin is warm and dry. No rash noted. No erythema.  Psychiatric: He has a normal mood and affect. His behavior is normal. Judgment and thought content normal.  Vitals reviewed.     BP 140/81 mmHg  Pulse 88  Temp(Src) 97.5 F (36.4 C) (Oral)  Ht _0  (1.803 m)  Wt 363 lb (164.656 kg)  BMI 50.65 kg/m2     Assessment & Plan:  1. Acute systolic congestive heart failure (HCC) - CMP14+EGFR  2. Coronary artery disease involving native coronary artery of native heart without angina pectoris - CMP14+EGFR  3. Essential hypertension - CMP14+EGFR  4. Obstructive sleep apnea - CMP14+EGFR  5. Uncontrolled type 2 diabetes mellitus with complication, with long-term current use of insulin (HCC) - POCT glycosylated hemoglobin (Hb A1C) -  CMP14+EGFR - Ambulatory referral to Ophthalmology  6. Vitamin D deficiency - CMP14+EGFR - Vit D  25 hydroxy (rtn osteoporosis monitoring)  7. S/P CABG x 4 - CMP14+EGFR  8. Morbid obesity, unspecified obesity type ([place]) - CMP14+EGFR  9. Hyperlipidemia - CMP14+EGFR - Lipid panel  10. Adjustment disorder with mixed anxiety and depressed mood - CMP14+EGFR  *Note given to excuse from [place] all meds Labs pending Health Maintenance reviewed Diet and exercise encouraged RTO 3 months  Evelina Dun, FNP

## 2015-08-07 NOTE — Addendum Note (Signed)
Addended by: Almeta MonasSTONE, JANIE M on: 08/07/2015 11:41 AM   Modules accepted: Orders

## 2015-08-07 NOTE — Patient Instructions (Signed)

## 2015-08-07 NOTE — Addendum Note (Signed)
Addended by: Prescott GumLAND, Torri Michalski M on: 08/07/2015 11:39 AM   Modules accepted: Kipp BroodSmartSet

## 2015-08-08 ENCOUNTER — Other Ambulatory Visit: Payer: Self-pay | Admitting: Family

## 2015-08-08 LAB — CMP14+EGFR
A/G RATIO: 1.7 (ref 1.1–2.5)
ALBUMIN: 4.4 g/dL (ref 3.5–5.5)
ALK PHOS: 160 IU/L — AB (ref 39–117)
ALT: 26 IU/L (ref 0–44)
AST: 17 IU/L (ref 0–40)
BILIRUBIN TOTAL: 1.1 mg/dL (ref 0.0–1.2)
BUN/Creatinine Ratio: 16 (ref 9–20)
BUN: 16 mg/dL (ref 6–24)
CO2: 27 mmol/L (ref 18–29)
Calcium: 9.4 mg/dL (ref 8.7–10.2)
Chloride: 100 mmol/L (ref 97–106)
Creatinine, Ser: 1.01 mg/dL (ref 0.76–1.27)
GFR calc Af Amer: 105 mL/min/{1.73_m2} (ref >59)
GFR calc non Af Amer: 91 mL/min/{1.73_m2} (ref >59)
GLOBULIN, TOTAL: 2.6 g/dL (ref 1.5–4.5)
Glucose: 197 mg/dL — ABNORMAL HIGH (ref 65–99)
POTASSIUM: 4.9 mmol/L (ref 3.5–5.2)
SODIUM: 141 mmol/L (ref 136–144)
Total Protein: 7 g/dL (ref 6.0–8.5)

## 2015-08-08 LAB — LIPID PANEL
CHOL/HDL RATIO: 5.7 ratio — AB (ref 0.0–5.0)
CHOLESTEROL TOTAL: 130 mg/dL (ref 100–199)
HDL: 23 mg/dL — ABNORMAL LOW (ref >39)
LDL Calculated: 81 mg/dL (ref 0–99)
TRIGLYCERIDES: 129 mg/dL (ref 0–149)
VLDL Cholesterol Cal: 26 mg/dL (ref 5–40)

## 2015-08-08 LAB — VITAMIN D 25 HYDROXY (VIT D DEFICIENCY, FRACTURES): Vit D, 25-Hydroxy: 38.2 ng/mL (ref 30.0–100.0)

## 2015-08-08 MED ORDER — METFORMIN HCL 1000 MG PO TABS: 1000.0000 mg | ORAL_TABLET | Freq: Two times a day (BID) | ORAL | Status: DC

## 2015-08-18 ENCOUNTER — Encounter: Payer: Self-pay | Admitting: *Deleted

## 2015-08-20 ENCOUNTER — Encounter: Payer: Self-pay | Admitting: *Deleted

## 2015-08-20 IMAGING — CR DG CHEST 2V
2 series · 2 of 2 positions shown · non-contrast
Comparison: October 21, 2014

CLINICAL DATA: Two week history of productive cough

EXAM:
CHEST  2 VIEW

[w chest pa]
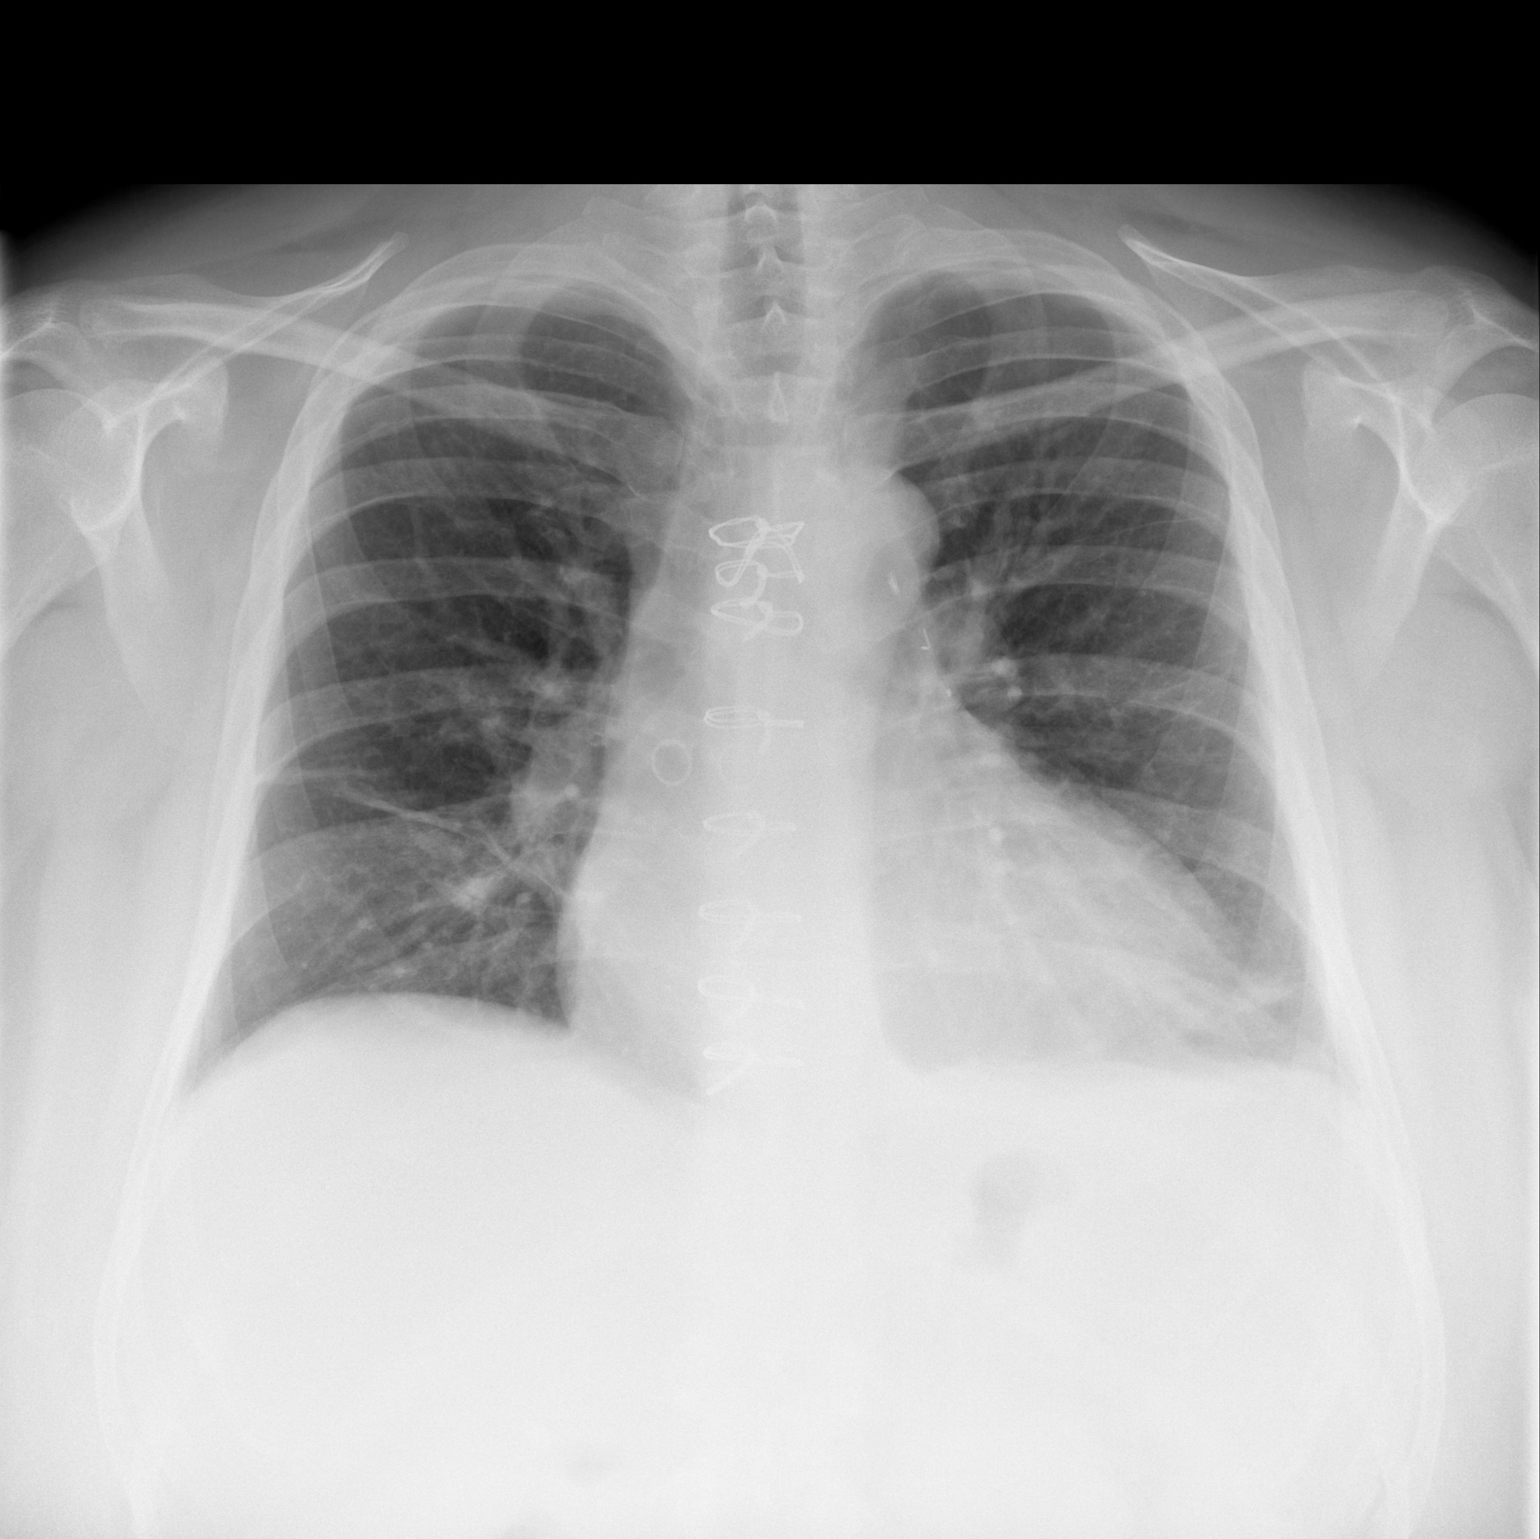

[w chest lat]
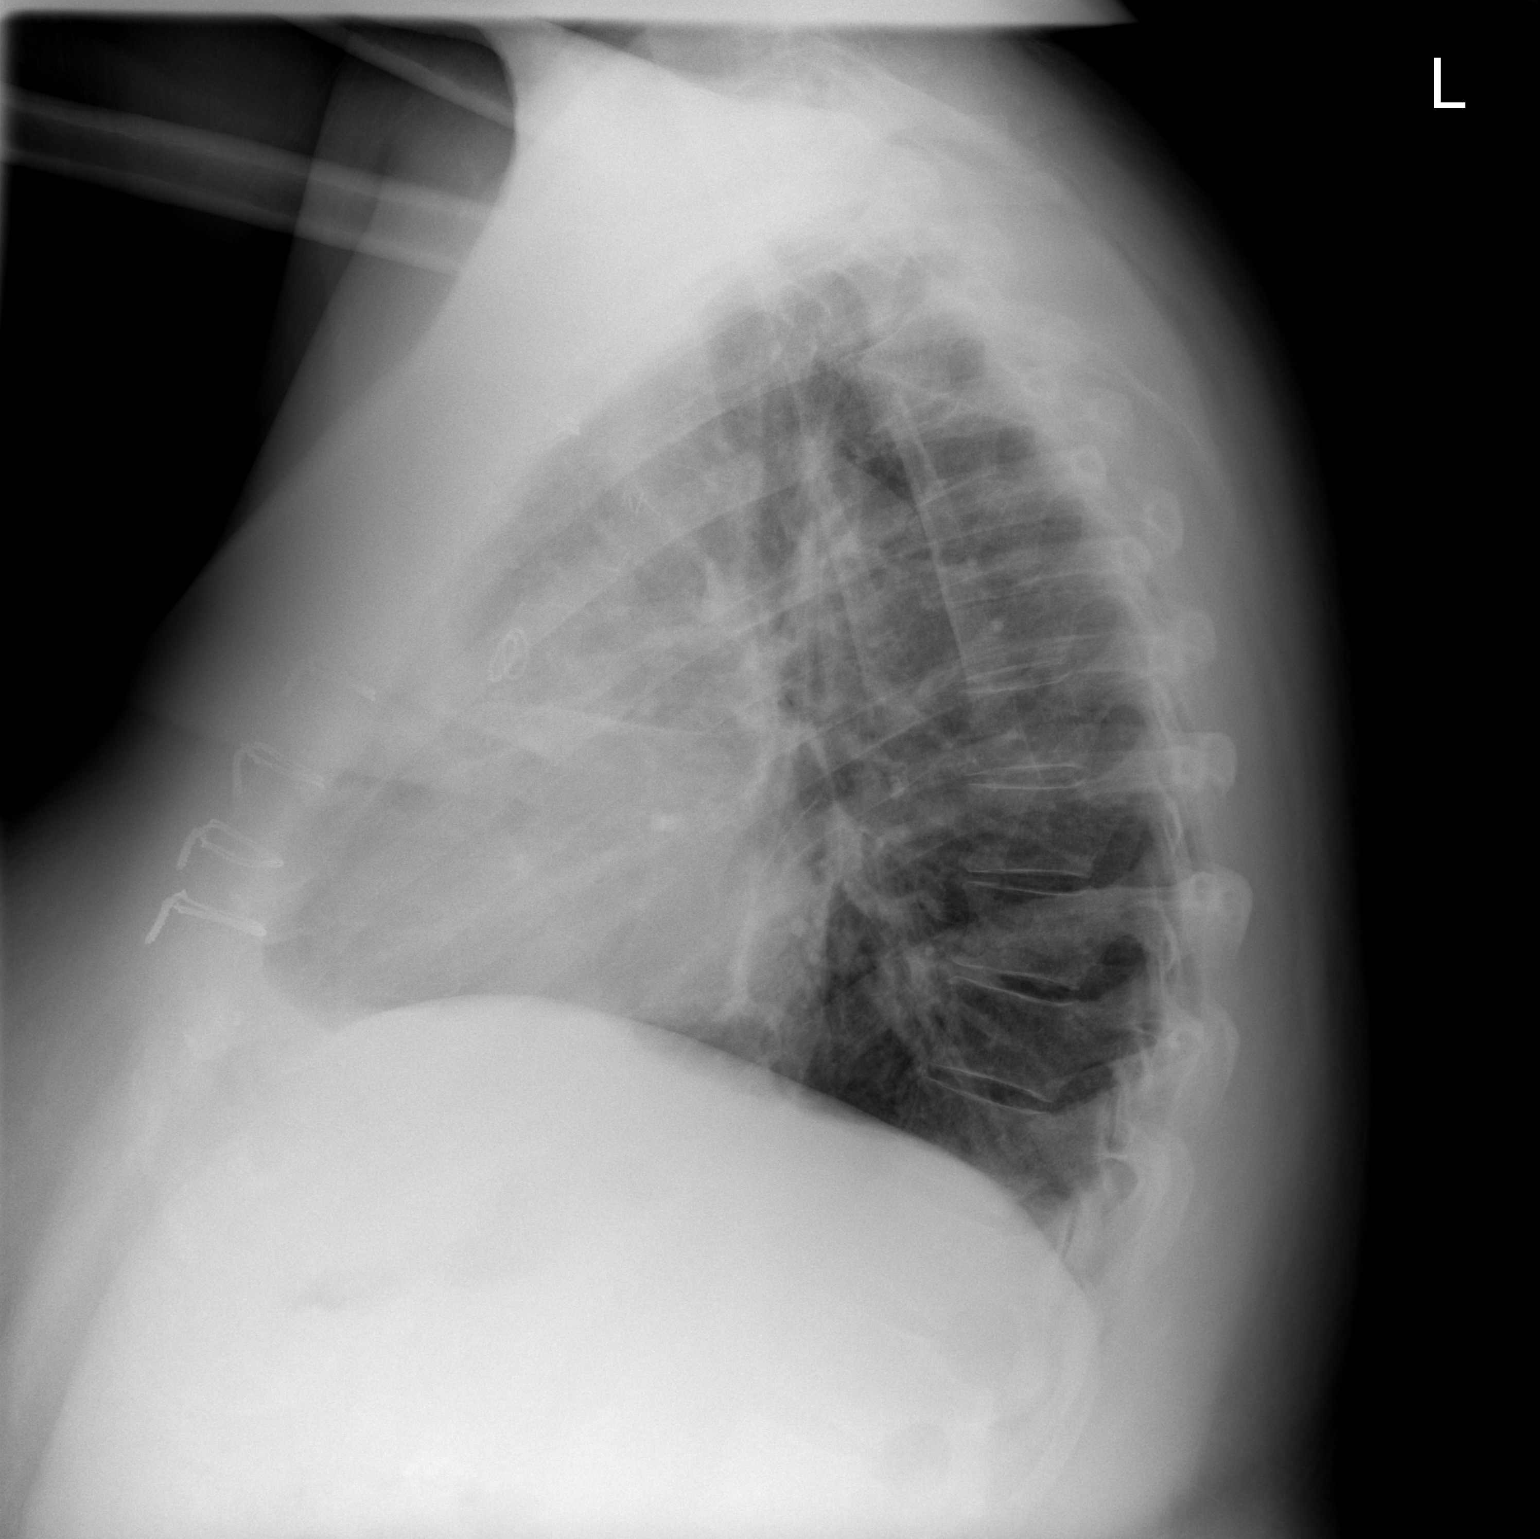

[2 of 2 positions shown; findings below may reference images not displayed]

FINDINGS: There is patchy bibasilar atelectatic change. There is no edema or
consolidation. Heart is borderline enlarged with pulmonary
vascularity within normal limits. Patient is status post coronary
artery bypass grafting. No adenopathy. No bone lesions.
IMPRESSION: Patchy bibasilar atelectatic change. No frank edema or
consolidation.

## 2015-08-27 ENCOUNTER — Telehealth: Payer: Self-pay | Admitting: *Deleted

## 2015-08-27 MED ORDER — MAGNESIUM OXIDE 400 MG PO TABS: ORAL_TABLET | ORAL | Status: DC

## 2015-08-27 NOTE — Telephone Encounter (Signed)
Pt verbalized understanding of plan, Medication sent to pharmacy.

## 2015-08-27 NOTE — Telephone Encounter (Signed)
-----   Message from Antoine PocheJonathan F Branch, MD sent at 08/26/2015  1:55 PM EST ----- Labs look good other than Magnesium is low. Please take Magnesium oxide 400mg  bid x 5 days, then 400mg  daily.  Dominga FerryJ Branch MD

## 2015-08-28 ENCOUNTER — Encounter: Payer: Self-pay | Admitting: Cardiology

## 2015-08-28 ENCOUNTER — Ambulatory Visit (INDEPENDENT_AMBULATORY_CARE_PROVIDER_SITE_OTHER): Payer: 59 | Admitting: Cardiology

## 2015-08-28 VITALS — BP 151/89 | HR 76 | Ht 71.0 in | Wt 366.2 lb

## 2015-08-28 DIAGNOSIS — I10 Essential (primary) hypertension: Secondary | ICD-10-CM | POA: Diagnosis not present

## 2015-08-28 MED ORDER — FUROSEMIDE 20 MG PO TABS: 40.0000 mg | ORAL_TABLET | Freq: Every day | ORAL | Status: DC

## 2015-08-28 MED ORDER — FUROSEMIDE 20 MG PO TABS: 60.0000 mg | ORAL_TABLET | Freq: Every day | ORAL | Status: DC

## 2015-08-28 NOTE — Progress Notes (Signed)
Patient ID: [name], male   DOB: Feb 20, 1971, 44 y.o.   MRN: [id number]     Clinical Summary Mr. Yuan is a 44 y.o.male seen today for follow up of the following medical problems. This is a focused visit on his history of chronic systolic HF.   1. CAD/Chronic systolic HF/ICM - hx of prior anterior STEMI in April 2014 at [facility], received stent to LAD - admit Jan 2016 with MI, cath severe CAD as described below with LVEF by LV gram 25% referred for CABG - TEE LVEF 25-30% - s/p CABG Jan 2016 (LIMA-LAD, SVG-diag, SVG-PDA,SVG-OM) - seen by EP 02/2015 for ICD evaluation, patient requested to hold off at that time.  - last visit significant weight gain and increased edema. He was not taking his lasix regularly. Since last visit has been taking lasix 68m daily, no change in edema or weight.    Past Medical History  Diagnosis Date  . Coronary artery disease 01/2013    anterior MI with cath showing 95% pLAD, 90% diag, 30% OM1, 70% mRCA, 60% dRCA s/p PCI with DES to LAD and diag and EF 40%  . Ischemic dilated cardiomyopathy   . Hypertension   . Morbid obesity ([place]   . Chronic systolic CHF (congestive heart failure) ([place]   . Visual field defect 10/12/2014  . Diabetes mellitus type 2, uncontrolled, with complications ([place]   . Stroke ([facility] 10/12/2014    Patient with several month h/o left sided visual field deficit and MRI of brain revealing:  1. Medial right occipital lobe subacute infarct. 2. At least 3 punctate areas of acute nonhemorrhagic infarct are present involving the left thalamus, high posterior right frontal lobe and high a medial posterior left frontal or parietal lobe. 3. Additional white matter changes are noted beyond the areas of  . 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 DJD (degenerative joint disease)     knees     Allergies  Allergen Reactions  . Nitroglycerin Other (See Comments)    Hypotension, syncope,  bradycardia     Current Outpatient Prescriptions  Medication Sig Dispense Refill  . aspirin 81 MG tablet Take 81 mg by mouth daily.    .Marland Kitchenatorvastatin (LIPITOR) 80 MG tablet Take 1 tablet (80 mg total) by mouth daily at 6 PM. 30 tablet 6  . citalopram (CELEXA) 40 MG tablet TAKE ONE TABLET BY MOUTH AT BEDTIME 30 tablet 0  . furosemide (LASIX) 20 MG tablet 1 tab daily as needed for swelling or shortness of breath 90 tablet 3  . furosemide (LASIX) 20 MG tablet Take 1 tablet (20 mg total) by mouth daily. 90 tablet 3  . glucose blood test strip Use as instructed 100 each 12  . Insulin Detemir (LEVEMIR) 100 UNIT/ML Pen Inject 50 Units into the skin daily at 10 pm. (Patient taking differently: Inject 50 Units into the skin daily at 10 pm. Sometimes uses levemir pen) 15 mL 11  . Insulin Pen Needle 31G X 8 MM MISC Use every night at bedtime 100 each 11  . insulin starter kit- pen needles MISC 1 kit by Other route once. 1 kit 10  . insulin starter kit- syringes MISC 1 kit by Other route once. 1 kit 10  . Insulin Syringes, Disposable, U-100 1 ML MISC Use once daily 60 each 11  . losartan (COZAAR) 100 MG tablet Take 1  tablet (100 mg total) by mouth daily. 90 tablet 3  . magnesium oxide (MAG-OX) 400 MG tablet Take 1 tab 2 times daily for 5 days then 1 tab daily 40 tablet 3  . metFORMIN (GLUCOPHAGE) 1000 MG tablet Take 1 tablet (1,000 mg total) by mouth 2 (two) times daily with a meal. 180 tablet 1  . metoprolol (TOPROL-XL) 200 MG 24 hr tablet TAKE 1 TABLET ONCE DAILY. 30 tablet 3  . Misc Natural Products (BLACK CHERRY CONCENTRATE PO) Take 1 tablet by mouth daily as needed (gout flare-up).     Marland Kitchen spironolactone (ALDACTONE) 25 MG tablet Take 0.5 tablets (12.5 mg total) by mouth daily. 90 tablet 3  . Vitamin D, Ergocalciferol, (DRISDOL) 50000 UNITS CAPS capsule Take 1 capsule (50,000 Units total) by mouth every 7 (seven) days. 12 capsule 3   No current facility-administered medications for this visit.      Past Surgical History  Procedure Laterality Date  . Cardiac catheterization  01/2013    [facility] in [place]  . Coronary angioplasty  01/2013    PCI with stenting of LAD  . Cholecystectomy    . Left heart cath N/A 10/10/2014    Procedure: LEFT HEART CATH;  Surgeon: Troy Sine, MD;  Location: [facility] CATH LAB;  Service: Cardiovascular;  Laterality: N/A;  . Coronary artery bypass graft N/A 10/13/2014    Procedure: CORONARY ARTERY BYPASS GRAFTING (CABG), ON PUMP, TIMES FOUR, USING LEFT INTERNAL MAMMARY ARTERY, BILATERAL GREATER SAPHENOUS VEINS HARVESTED ENDOSCOPICALLY;  Surgeon: Rexene Alberts, MD;  Location: [place];  Service: Open Heart Surgery;  Laterality: N/A;  . Multiple extractions with alveoloplasty N/A 10/22/2014    Procedure: Extraction of tooth #'s 1,3,4,5,6,7,8,9,10,11,12,13,14,15,16,20,21,22,24, 25,26,27,28,29,30,32 with alveoloplasty.;  Surgeon: Lenn Cal, DDS;  Location: [place];  Service: Oral Surgery;  Laterality: N/A;     Allergies  Allergen Reactions  . Nitroglycerin Other (See Comments)    Hypotension, syncope, bradycardia      Family History  Problem Relation Age of Onset  . Heart attack Brother 31  . Diabetes Brother   . Arthritis Mother   . Asthma Mother   . Heart disease Maternal Uncle   . Heart disease Maternal Grandfather   . Alcohol abuse Maternal Grandfather      Social History Mr. Andres reports that he quit smoking about 7 years ago. His smoking use included Cigarettes. He started smoking about 25 years ago. He has a 18 pack-year smoking history. He has never used smokeless tobacco. Mr. Henrichs reports that he does not drink alcohol.   Review of Systems CONSTITUTIONAL: No weight loss, fever, chills, weakness or fatigue.  HEENT: Eyes: No visual loss, blurred vision, double vision or yellow sclerae.No hearing loss, sneezing, congestion, runny nose or sore throat.  SKIN: No rash or itching.  CARDIOVASCULAR: no chest pain, no  palpitations RESPIRATORY: No shortness of breath, cough or sputum.  GASTROINTESTINAL: No anorexia, nausea, vomiting or diarrhea. No abdominal pain or blood.  GENITOURINARY: No burning on urination, no polyuria NEUROLOGICAL: No headache, dizziness, syncope, paralysis, ataxia, numbness or tingling in the extremities. No change in bowel or bladder control.  MUSCULOSKELETAL: No muscle, back pain, joint pain or stiffness.  LYMPHATICS: No enlarged nodes. No history of splenectomy.  PSYCHIATRIC: No history of depression or anxiety.  ENDOCRINOLOGIC: No reports of sweating, cold or heat intolerance. No polyuria or polydipsia.  Marland Kitchen   Physical Examination Filed Vitals:   08/28/15 1454  BP: 151/89  Pulse: 76   Filed Vitals:  08/28/15 1454  Height: 5' 11"  (1.803 m)  Weight: 366 lb 3.2 oz (166.107 kg)    Gen: resting comfortably, no acute distress HEENT: no scleral icterus, pupils equal round and reactive, no palptable cervical adenopathy,  CV: RRR, no m/r/g, no jvd Resp: Clear to auscultation bilaterally GI: abdomen is soft, non-tender, non-distended, normal bowel sounds, no hepatosplenomegaly MSK: extremities are warm, no edema.  Skin: warm, no rash Neuro:  no focal deficits Psych: appropriate affect   Diagnostic Studies  10/2014 Cath ANGIOGRAPHY:  Left main: Moderate size vessel which trifurcated into an LAD and intermediate vessel and left circumflex coronary artery.  LAD: The LAD was subtotally occluded at its ostium and there was diffuse 95-99% ostial proximal stenosis and then was totally occluded in the region of the first diagonal Thomes Burak. There was a gap and then faint filling of a second diagonal Allexa Acoff which had a stent and there was faint filling of the LAD beyond the diagonal vessel via collaterals.  Ramus Intermediate: Small caliber vessel free of significant disease.  Left circumflex: Large caliber vessel that gave rise to 2 major marginal branches and then in the  posterior lateral like coronary artery. The first marginal Kari Kerth was moderate size and had proximal 90% followed by 80% stenoses. A distal superior Siona Coulston had 95% stenosis.  Right coronary artery: Moderate size vessel that had 95% mid stenosis and 80% distal stenosis in the region of the acute margin. The vessel supplied the PDA. There were septal collaterals to the LAD.   Left ventriculography revealed dilated ventricle with an ejection fraction of 25% with diffuse global hypokinesis   IMPRESSION:  Severe ischemic dilated cardiomyopathy with an ejection fraction approximately 25%.  Severe multivessel coronary obstructive disease with diffusely diseased subtotal occlusion of the ostial and proximal LAD with faint collaterals to the first and second diagonal vessel and more distal LAD; left circumflex coronary artery with tandem 90 and 80% obtuse marginal 1 stenosis with distal 95% stenosis in this distal superior Nadia Torr of this marginal vessel; and 95% mid RCA stenosis with 80% stenosis in the region of the acute margin with evidence for septal collaterals from the PDA to the LAD.  RECOMMENDATION:  The patient has surgical anatomy and CABG revascularization surgery will be recommended with surgical consultation in the morning. The patient was started on Aggrastat post procedure to reduce likelihood of progressive thrombosis. An attempt was made to initiate low-dose IV nitroglycerin, but the patient transiently dropped his blood pressure and this was discontinued.  10/2014 TEE Study Conclusions  - Left ventricle: Septal apical and anterior wall hypokinesis The cavity size was severely dilated. Systolic function was severely reduced. The estimated ejection fraction was in the range of 25% to 30%. - Aortic valve: There was trivial regurgitation. - Left atrium: The atrium was mildly dilated. - Atrial septum: No defect or patent foramen ovale was identified. - Pericardium,  extracardiac: Small pericardial effusion IVC flat no evidence of tamponade.    02/2015 Echo Study Conclusions  - Left ventricle: The cavity size was mildly dilated. There was mild concentric hypertrophy. Systolic function was moderately to severely reduced. The estimated ejection fraction was in the range of 30% to 35%. Contrast enhancement utilized. Doppler parameters are consistent with abnormal left ventricular relaxation (grade 1 diastolic dysfunction). Doppler parameters are consistent with high ventricular filling pressure. - Regional wall motion abnormality: Akinesis of the mid anterior, basal-mid anteroseptal, apical septal, and apical myocardium; severe hypokinesis of the mid inferoseptal myocardium; moderate hypokinesis of  the apical lateral myocardium; mild hypokinesis of the mid anterolateral myocardium. - Aortic valve: Mildly to moderately calcified annulus. - Aorta: Mild to moderate aortic root dilatation. Aortic root dimension: 44 mm (ED). - Mitral valve: Mildly calcified annulus. Mildly thickened leaflets . - Left atrium: The atrium was mildly dilated. Volume/bsa, ES, (1-plane Simpson&'s, A2C): 33.2 ml/m^2. - Right ventricle: Systolic function was mildly reduced.   Assessment and Plan   1. CAD/Chronic systolic HF/ICM - recent MI, found to have severe multivessel disease, s/p 4 vessel CABG Jan 2016. Echo 02/2015 LVEF 30-35% - he has elected to hold off on ICD for now, will continue to follow at this time - significantly increased weight and edema. We will increase lasix to 38m daily, check BMET and Mg in 3 weeks. He is to call u[place]ore[place] week to update [place]on his weights.    F/u 1 month      JArnoldo Lenis M.D.

## 2015-08-28 NOTE — Patient Instructions (Addendum)
Your physician recommends that you schedule a follow-up appointment in: 1 MONTH WITH DR. BRANCH  Your physician has recommended you make the following change in your medication:   INCREASE LASIX 60 MG DAILY  Your physician recommends that you return for lab work in: 3 WEEKS BMP/MG  Thank you for choosing [facility]!!

## 2015-09-07 ENCOUNTER — Encounter: Payer: Self-pay | Admitting: Family

## 2015-09-08 ENCOUNTER — Other Ambulatory Visit: Payer: Self-pay | Admitting: Family

## 2015-09-09 LAB — HM DIABETES EYE EXAM

## 2015-09-16 ENCOUNTER — Encounter: Payer: Self-pay | Admitting: *Deleted

## 2015-09-18 ENCOUNTER — Ambulatory Visit: Payer: 59 | Admitting: Cardiology

## 2015-09-30 ENCOUNTER — Encounter: Payer: Self-pay | Admitting: *Deleted

## 2015-10-01 ENCOUNTER — Telehealth: Payer: Self-pay | Admitting: Nutrition

## 2015-10-01 NOTE — Telephone Encounter (Signed)
VM reminder of appt.

## 2015-10-02 ENCOUNTER — Encounter: Payer: 59 | Attending: Family | Admitting: Nutrition

## 2015-10-02 ENCOUNTER — Encounter: Payer: Self-pay | Admitting: Nutrition

## 2015-10-02 VITALS — Ht 71.0 in | Wt 366.8 lb

## 2015-10-02 DIAGNOSIS — E1165 Type 2 diabetes mellitus with hyperglycemia: Secondary | ICD-10-CM

## 2015-10-02 DIAGNOSIS — Z713 Dietary counseling and surveillance: Secondary | ICD-10-CM | POA: Diagnosis not present

## 2015-10-02 DIAGNOSIS — Z6841 Body Mass Index (BMI) 40.0 and over, adult: Secondary | ICD-10-CM | POA: Insufficient documentation

## 2015-10-02 DIAGNOSIS — I252 Old myocardial infarction: Secondary | ICD-10-CM | POA: Diagnosis not present

## 2015-10-02 DIAGNOSIS — I10 Essential (primary) hypertension: Secondary | ICD-10-CM | POA: Diagnosis not present

## 2015-10-02 DIAGNOSIS — Z794 Long term (current) use of insulin: Secondary | ICD-10-CM | POA: Insufficient documentation

## 2015-10-02 DIAGNOSIS — IMO0002 Reserved for concepts with insufficient information to code with codable children: Secondary | ICD-10-CM

## 2015-10-02 DIAGNOSIS — E118 Type 2 diabetes mellitus with unspecified complications: Secondary | ICD-10-CM

## 2015-10-02 NOTE — Progress Notes (Signed)
  Medical Nutrition Therapy:  Appt start time: 1100 end time: 1130  Assessment:  Primary concerns today: Follow up Diabetes and s/p  Heart Attack last January. FBS 190's. He didn't bring meter. He notes he has been forgetting his medications at time and that is making his blood sugar elevated.He notes he hasn't forgotten his insulin, just his pills at times. Not able to exercise due to severe arthritis  He he didn't lose any weight. He notes his cardiologist said he is retaining fluid and has increased his fluid pills. He notes that he has been eating ramen noodles because they are 'cheap.' Discussed dangers of sodium, fat and carb content and encouraged to discontinue them from his diet. Still taking 50 units of Levemir and 1000 mg of Metformin daily. Diet is inadequate to meet his nutritional needs; high in sodium and low in fresh fruits and vegetables.  Discussed weight loss surgery option for weight loss and improved chronic medical problems. He will discuss with PCP. Gave a brochure.  Preferred Learning Style:   Auditory  Visual  Hands on   Learning Readiness:     Ready  Change in progress  MEDICATIONS: See list   DIETARY INTAKE:  24-hr recall:  B ( AM): 3/4 cereal honey nut cherrios,  2% milk  Snk ( AM): none  L ( PM):  1 cup spinach, 1/2c low fat cottage cheese and 1 can of mini raveoli, water Snk ( PM):  D ( PM): Beef Gyro, Ruben and curly fry from [employer], Sugar free lemonade Snk ( PM): none Beverages: water  Usual physical activity: walking  Estimated energy needs: 1800 calories 200 g carbohydrates 135 g protein 50 g fat  Progress Towards Goal(s):  In progress.   Nutritional Diagnosis:  NB-1.1 Food and nutrition-related knowledge deficient related to diabetes as evidenced by A1C  Of 6.5.    Intervention:  Nutrition Counseling on diet, meal planning, diabetes as a progressive diabetes, target ranges for blood sugars, testing of blood sugars, low fat low sodium  high fiber diet and prevention of complications of DM and CAD.  Goals: 1. Follow My Plate Method 2. Drink almond milk instead of 2% milk 3. Set alarm on phone to remember about taking medications. 4. Increase fresh vegetables. 5. Talk to PCP about weight loss surgery.  Teaching Method Utilized:  Visual Auditory Hands on  Handouts given during visit include: The Plate Method Carb Counting and Food Label handouts Meal Plan Card       Bariatric Surgery Handout   Barriers to learning/adherence to lifestyle change: none  Demonstrated degree of understanding via:  Teach Back   Monitoring/Evaluation:  Dietary intake, exercise, meal planning, SBG and body weight in 3 month(s).

## 2015-10-02 NOTE — Patient Instructions (Signed)
Goals: 1. Follow My Plate Method 2. Drink almond milk instead of 2% milk 3. Set alarm on phone to remember about taking medications. 4. Increase fresh vegetables. 5. Talk to PCP about weight loss surgery.

## 2015-10-19 ENCOUNTER — Telehealth: Payer: Self-pay | Admitting: *Deleted

## 2015-10-19 ENCOUNTER — Encounter: Payer: Self-pay | Admitting: *Deleted

## 2015-10-19 ENCOUNTER — Other Ambulatory Visit: Payer: Self-pay | Admitting: *Deleted

## 2015-10-19 ENCOUNTER — Ambulatory Visit: Payer: 59 | Admitting: Cardiology

## 2015-10-19 NOTE — Telephone Encounter (Signed)
LM X 3 days, mailed pt letter with instructions to call our office.

## 2015-10-19 NOTE — Telephone Encounter (Signed)
-----   Message from Antoine PocheJonathan F Branch, MD sent at 10/16/2015 11:05 AM EST ----- Labs look good, can we call and see how his weights are doing  Dominga FerryJ Branch MD

## 2015-10-26 ENCOUNTER — Other Ambulatory Visit (INDEPENDENT_AMBULATORY_CARE_PROVIDER_SITE_OTHER): Payer: BLUE CROSS/BLUE SHIELD

## 2015-10-26 ENCOUNTER — Encounter: Payer: Self-pay | Admitting: Internal Medicine

## 2015-10-26 ENCOUNTER — Ambulatory Visit (INDEPENDENT_AMBULATORY_CARE_PROVIDER_SITE_OTHER): Payer: BLUE CROSS/BLUE SHIELD | Admitting: Internal Medicine

## 2015-10-26 VITALS — BP 128/82 | HR 81 | Ht 71.0 in | Wt 370.4 lb

## 2015-10-26 DIAGNOSIS — E039 Hypothyroidism, unspecified: Secondary | ICD-10-CM | POA: Diagnosis not present

## 2015-10-26 LAB — TSH: TSH: 4.36 u[IU]/mL (ref 0.35–4.50)

## 2015-10-26 NOTE — Patient Instructions (Signed)
We can continue current CPAP settings at least until we get some data from Mark Leach's  Remember CPAP only works if you wear it, and your heart needs you to wear it any time you sleep  Please call if we can help   Order- lab TSH    Dx hypothyroid

## 2015-10-26 NOTE — Progress Notes (Signed)
02/23/15- 67 yoM former smoker referred courtesy of [employer]; never had sleep study. Had quad bypass last year and wants to check for OSA at the recommendation of his cardiologist, Dr. Wyline Mood. Wife is here. She reports husband was a loud snorer before his MI in January 2016, after which he lost weight. CABG at that time. Asthma as a child but denies lung disease as an adult. History of tonsillectomy. Unemployed. Usual schedule is bedtime between 10 and 11 PM, sleep latency 10 minutes, waking twice before up in the morning at no specified time. Brother with obstructive sleep apnea.  04/02/15- 77 yoM former smoker followed for OSA, complicated by CAD/ MI/ CABG/ CHF, DM2 Unattended Home Sleep study 03/25/15 Mild OSA, AHI 6.1/ hr, desat to 79%, weight 333 lbs. FOLLOWS FOR: review home sleep test.  pt has no complaints at this time. He insists he feels "fine" and denies daytime tiredness. Son uses CPAP. He slept on his back for his home study.  His cardiologist raised initial question of OSA and they have been considering implanted defibrillator.  06/24/15- 89 yoM former smoker followed for OSA, complicated by CAD/ MI, DM2 FOLLOWS FOR: DME is [facility]-no DL since set up-will need to order; Pts he wears CPAP auto all night every night; pressure works well for pt. Full face mask. He recognizes no change in sleep and says per wife, he hasn't snored as much since he had MI.   10/26/2015-45 year old male former smoker followed for OSA, complicated by CAD/MI, DM 2 CPAP [facility] for: OSA. Pt states that he does have a CPAP machine and uses it 6-8 hours nightly. Pt states that he does still feel tired during the day. Pt has been using the CPAP for a little over 2 months.     ROS-see HPI   Negative unless "+" Constitutional:    weight loss, night sweats, fevers, chills, fatigue, lassitude. HEENT:    headaches, difficulty swallowing, tooth/dental problems, sore throat,       sneezing,  itching, ear ache, nasal congestion, post nasal drip, snoring CV:    chest pain, orthopnea, PND, swelling in lower extremities, anasarca,                                                  dizziness, palpitations Resp:  + shortness of breath with exertion or at rest.                +productive cough,   non-productive cough, coughing up of blood.              change in color of mucus.  wheezing.   Skin:    rash or lesions. GI:  No-   heartburn, indigestion, abdominal pain, nausea, vomiting,  GU: . MS:  + joint pain, stiffness,  Neuro-     nothing unusual Psych:  change in mood or affect.  +depression or anxiety.   memory loss.  OBJ- Physical Exam General- Alert, Oriented, Affect-appropriate, Distress- none acute, +obese, +passive affect Skin- rash-none, lesions- none, excoriation- none Lymphadenopathy- none Head- atraumatic            Eyes- Gross vision intact, PERRLA, conjunctivae and secretions clear            Ears- Hearing, canals-normal            Nose- Clear, no-Septal dev,  mucus, polyps, erosion, perforation             Throat- Mallampati II , mucosa clear , drainage- none, tonsils- atrophic Neck- flexible , trachea midline, no stridor , thyroid nl, carotid no bruit Chest - symmetrical excursion , unlabored           Heart/CV- RRR , no murmur , no gallop  , no rub, nl s1 s2                           - JVD- none , edema+2, stasis changes- none, varices- none           Lung- clear to P&A, wheeze- none, cough- none , dullness-none, rub- none           Chest wall-  Abd-  Br/ Gen/ Rectal- Not done, not indicated Extrem- + stasis changes and scars on lower extremities, heavy legs Neuro- grossly intact to observation

## 2015-10-27 ENCOUNTER — Encounter: Payer: Self-pay | Admitting: Family

## 2015-10-27 DIAGNOSIS — Z794 Long term (current) use of insulin: Principal | ICD-10-CM

## 2015-10-27 DIAGNOSIS — IMO0002 Reserved for concepts with insufficient information to code with codable children: Secondary | ICD-10-CM

## 2015-10-27 DIAGNOSIS — E118 Type 2 diabetes mellitus with unspecified complications: Principal | ICD-10-CM

## 2015-10-27 DIAGNOSIS — E1165 Type 2 diabetes mellitus with hyperglycemia: Secondary | ICD-10-CM

## 2015-10-28 ENCOUNTER — Other Ambulatory Visit: Payer: Self-pay | Admitting: Family

## 2015-10-28 DIAGNOSIS — E118 Type 2 diabetes mellitus with unspecified complications: Principal | ICD-10-CM

## 2015-10-28 DIAGNOSIS — E1165 Type 2 diabetes mellitus with hyperglycemia: Secondary | ICD-10-CM

## 2015-10-28 DIAGNOSIS — IMO0002 Reserved for concepts with insufficient information to code with codable children: Secondary | ICD-10-CM

## 2015-10-28 DIAGNOSIS — Z794 Long term (current) use of insulin: Principal | ICD-10-CM

## 2015-10-28 MED ORDER — GLUCOSE BLOOD VI STRP: ORAL_STRIP | Status: DC

## 2015-11-02 ENCOUNTER — Ambulatory Visit (INDEPENDENT_AMBULATORY_CARE_PROVIDER_SITE_OTHER): Payer: BLUE CROSS/BLUE SHIELD | Admitting: Thoracic Surgery (Cardiothoracic Vascular Surgery)

## 2015-11-02 ENCOUNTER — Encounter: Payer: Self-pay | Admitting: Thoracic Surgery (Cardiothoracic Vascular Surgery)

## 2015-11-02 VITALS — BP 178/86 | HR 82 | Resp 18 | Ht 71.0 in | Wt 365.0 lb

## 2015-11-02 DIAGNOSIS — Z951 Presence of aortocoronary bypass graft: Secondary | ICD-10-CM

## 2015-11-02 NOTE — Patient Instructions (Addendum)
Continue all previous medications without any changes at this time  Make every effort to keep your diabetes under very tight control.  Follow up closely with your primary care physician or endocrinologist and strive to keep their hemoglobin A1c levels as low as possible, preferably near or below 6.0.  The long term benefits of strict control of diabetes are far reaching and critically important for your overall health and survival.  Make every effort to find a way to lose weight.  I agree with the idea that you might benefit from weight loss surgery.  Make every effort to stay physically active, get some type of exercise on a regular basis, and stick to a "heart healthy diet".  The long term benefits for regular exercise and a healthy diet are critically important to your overall health and wellbeing.

## 2015-11-02 NOTE — Progress Notes (Signed)
    [address]       [place] [address]             [phone]     CARDIOTHORACIC SURGERY OFFICE NOTE  Referring Provider is Turner, Traci R, MD  Primary Cardiologist is Branch, Jonathan F, MD PCP is Christy Hawks, FNP   HPI:  Patient is a 44-year-old morbidly obese white male with history of coronary artery disease, chronic combined systolic and diastolic congestive heart failure, previous stroke, and type 2 diabetes mellitus who returns to the office today for routine follow-up approximately one year status post coronary artery bypass grafting 4 on 10/13/2014 for severe three-vessel coronary artery disease status post acute ST segment elevation myocardial infarction. The patient's postoperative recovery was uncomplicated and he was last seen here in our office on 12/02/2014 at which time he was making satisfactory progress. Since then he has been followed carefully by Dr. Branch. He was referred for possible ICD implantation but the patient declined. He was last seen in the office by Dr. Branch on 08/28/2015 at which time his dose of Lasix was increased because of further problems with chronic combined systolic and diastolic congestive heart failure. His most recent hemoglobin A1c was measured 8.1. His dose of metformin was increased at that time. He has been evaluated recently by Dr. Young for management of obstructive sleep apnea. He returns to our office today for routine follow-up. He states that overall he feels much improved in comparison with how he was doing prior to his surgery. He states that presently he only gets short of breath with more strenuous activity. He is limited primarily by severe pain in his knees related to arthritis. He denies any symptoms of exertional chest pain or chest tightness. He has not been able to lose a significant amount of weight. He states that he does try to walk every day, although he is hampered considerably by his poor  knees.  Current Outpatient Prescriptions  Medication Sig Dispense Refill  . aspirin 81 MG tablet Take 81 mg by mouth daily.    . atorvastatin (LIPITOR) 80 MG tablet Take 1 tablet (80 mg total) by mouth daily at 6 PM. 30 tablet 6  . citalopram (CELEXA) 40 MG tablet TAKE ONE TABLET BY MOUTH AT BEDTIME 30 tablet 1  . furosemide (LASIX) 20 MG tablet Take 3 tablets (60 mg total) by mouth daily. 90 tablet 3  . glucose blood test strip Use as instructed 100 each 12  . Insulin Detemir (LEVEMIR) 100 UNIT/ML Pen Inject 50 Units into the skin daily at 10 pm. (Patient taking differently: Inject 50 Units into the skin daily at 10 pm. Sometimes uses levemir pen) 15 mL 11  . Insulin Pen Needle 31G X 8 MM MISC Use every night at bedtime 100 each 11  . insulin starter kit- pen needles MISC 1 kit by Other route once. 1 kit 10  . insulin starter kit- syringes MISC 1 kit by Other route once. 1 kit 10  . Insulin Syringes, Disposable, U-100 1 ML MISC Use once daily 60 each 11  . losartan (COZAAR) 100 MG tablet Take 1 tablet (100 mg total) by mouth daily. 90 tablet 3  . magnesium oxide (MAG-OX) 400 MG tablet Take 1 tab 2 times daily for 5 days then 1 tab daily 40 tablet 3  . metFORMIN (GLUCOPHAGE) 1000 MG tablet Take 1 tablet (1,000 mg total) by mouth 2 (two) times daily with a meal. 180 tablet   1  . metoprolol (TOPROL-XL) 200 MG 24 hr tablet TAKE 1 TABLET ONCE DAILY. 30 tablet 3  . Misc Natural Products (BLACK CHERRY CONCENTRATE PO) Take 1 tablet by mouth daily as needed (gout flare-up).     Marland Kitchen spironolactone (ALDACTONE) 25 MG tablet Take 0.5 tablets (12.5 mg total) by mouth daily. 90 tablet 3  . Vitamin D, Ergocalciferol, (DRISDOL) 50000 UNITS CAPS capsule Take 1 capsule (50,000 Units total) by mouth every 7 (seven) days. 12 capsule 3   No current facility-administered medications for this visit.      Physical Exam:   BP 178/86 mmHg  Pulse 82  Resp 18  Ht 5' 11" (1.803 m)  Wt 365 lb (165.563 kg)  BMI 50.93  kg/m2  SpO2 97%  General:  Morbidly obese  Chest:   Clear to auscultation  CV:   Regular rate and rhythm without murmur  Incisions:  Completely healed, sternum is stable  Abdomen:  Soft and nontender  Extremities:  Warm and well-perfused, severe lower extremity edema  Diagnostic Tests:  Transthoracic Echocardiography  Patient:  Mark Leach, Mark Leach MR #:    [id number] Study Date: 02/17/2015 Gender:   M Age:    74 Height:   180.3 cm Weight:   150.6 kg BSA:    2.82 m^2 Pt. Status: Room:  ATTENDING  Kerry Hough, M.D. Berna Spare, M.D. REFERRING  Kerry Hough, M.D. PERFORMING  [facility], [facility] SONOGRAPHER Titus Mould, RCS  cc:  ------------------------------------------------------------------- LV EF: 30% -  35%  ------------------------------------------------------------------- Indications:   CHF - 428.0.  ------------------------------------------------------------------- Study Conclusions  - Left ventricle: The cavity size was mildly dilated. There was mild concentric hypertrophy. Systolic function was moderately to severely reduced. The estimated ejection fraction was in the range of 30% to 35%. Contrast enhancement utilized. Doppler parameters are consistent with abnormal left ventricular relaxation (grade 1 diastolic dysfunction). Doppler parameters are consistent with high ventricular filling pressure. - Regional wall motion abnormality: Akinesis of the mid anterior, basal-mid anteroseptal, apical septal, and apical myocardium; severe hypokinesis of the mid inferoseptal myocardium; moderate hypokinesis of the apical lateral myocardium; mild hypokinesis of the mid anterolateral myocardium. - Aortic valve: Mildly to moderately calcified annulus. - Aorta: Mild to moderate aortic root dilatation. Aortic root dimension: 44 mm (ED). - Mitral valve: Mildly calcified annulus. Mildly  thickened leaflets . - Left atrium: The atrium was mildly dilated. Volume/bsa, ES, (1-plane Simpson&'s, A2C): 33.2 ml/m^2. - Right ventricle: Systolic function was mildly reduced.  Transthoracic echocardiography. M-mode, complete 2D, spectral Doppler, and color Doppler. Birthdate: Patient birthdate: 10-08-1971. Age: Patient is 45 yr old. Sex: Gender: male. BMI: 46.3 kg/m^2. Blood pressure:   163/90 Patient status: Inpatient. Study date: Study date: 02/17/2015. Study time: 02:50 PM. Location: Echo laboratory.  -------------------------------------------------------------------  ------------------------------------------------------------------- Left ventricle: The cavity size was mildly dilated. There was mild concentric hypertrophy. Systolic function was moderately to severely reduced. The estimated ejection fraction was in the range of 30% to 35%. Regional wall motion abnormalities: Akinesis of the mid anterior, basal-mid anteroseptal, apical septal, and apical myocardium; severe hypokinesis of the mid inferoseptal myocardium; moderate hypokinesis of the apical lateral myocardium; mild hypokinesis of the mid anterolateral myocardium. Doppler parameters are consistent with abnormal left ventricular relaxation (grade 1 diastolic dysfunction). Doppler parameters are consistent with high ventricular filling pressure.  ------------------------------------------------------------------- Aortic valve: Poorly visualized. Probably trileaflet. Mildly to moderately calcified annulus. Doppler:  There was no stenosis.  ------------------------------------------------------------------- Aorta: Mild to moderate aortic root dilatation.  ------------------------------------------------------------------- Mitral  valve:  Mildly calcified annulus. Mildly thickened leaflets . Doppler: There was no significant regurgitation.  Peak gradient (D): 2 mm  Hg.  ------------------------------------------------------------------- Left atrium: The atrium was mildly dilated.  ------------------------------------------------------------------- Atrial septum: No defect or patent foramen ovale was identified.  ------------------------------------------------------------------- Right ventricle: Poorly visualized. The cavity size was normal. Systolic function was mildly reduced.  ------------------------------------------------------------------- Pulmonic valve:  The valve appears to be grossly normal. Doppler: There was trivial regurgitation.  ------------------------------------------------------------------- Tricuspid valve: Poorly visualized. Doppler: There was no significant regurgitation.  ------------------------------------------------------------------- Right atrium: The atrium was normal in size.  ------------------------------------------------------------------- Pericardium: There was no pericardial effusion.  ------------------------------------------------------------------- Systemic veins: Inferior vena cava: The vessel was normal in size. The respirophasic diameter changes were in the normal range (>= 50%), consistent with normal central venous pressure.  ------------------------------------------------------------------- Post procedure conclusions Ascending Aorta:  - Mild to moderate aortic root dilatation.  ------------------------------------------------------------------- Measurements  Left ventricle             Value    Reference LV ID, ED, PLAX chordal    (H)   61.5 mm   43 - 52 LV ID, ES, PLAX chordal    (H)   52.4 mm   23 - 38 LV fx shortening, PLAX chordal (L)   15  %   >=29 LV PW thickness, ED          12.8 mm   --------- IVS/LV PW ratio, ED          0.97     <=1.3 LV e&', lateral             5.36 cm/s   --------- LV E/e&', lateral            14.12    --------- LV e&', medial             5.36 cm/s  --------- LV E/e&', medial            14.12    --------- LV e&', average             5.36 cm/s  --------- LV E/e&', average            14.12    ---------  Ventricular septum           Value    Reference IVS thickness, ED           12.4 mm   ---------  LVOT                  Value    Reference LVOT ID, S               24  mm   --------- LVOT area               4.52 cm^2  ---------  Aorta                 Value    Reference Aortic root ID, ED           44  mm   ---------  Left atrium              Value    Reference LA ID, A-P, ES             44  mm   --------- LA ID/bsa, A-P             1.56 cm/m^2 <=2.2 LA volume, S  88.3 ml   --------- LA volume/bsa, S            31.3 ml/m^2 --------- LA volume, ES, 1-p A4C         69.4 ml   --------- LA volume/bsa, ES, 1-p A4C       24.6 ml/m^2 --------- LA volume, ES, 1-p A2C         93.7 ml   --------- LA volume/bsa, ES, 1-p A2C       33.2 ml/m^2 ---------  Mitral valve              Value    Reference Mitral E-wave peak velocity      75.7 cm/s  --------- Mitral A-wave peak velocity      95.1 cm/s  --------- Mitral deceleration time    (H)   243  ms   150 - 230 Mitral peak gradient, D        2   mm Hg --------- Mitral E/A ratio, peak         0.8     ---------  Systemic veins             Value    Reference Estimated CVP             3   mm Hg ---------  Right ventricle            Value     Reference RV ID, ED, PLAX            33.2 mm   19 - 38 TAPSE                 12.2 mm   ---------  Legend: (L) and (H) mark values outside specified reference range.  ------------------------------------------------------------------- Prepared and Electronically Authenticated by  Suresh Koneswaran, MD 2016-05-10T16:43:13   Impression:  Patient remains clinically stable approximately 1 year status post coronary artery bypass grafting 4. His long-term prognosis remains guarded because of his numerous severe comorbid medical problems, most notably including his extreme morbid obesity, chronic combined systolic and diastolic congestive heart failure, limited mobility, and poorly controlled diabetes.    Plan:  I had a very frank discussion with the patient regarding his underlying medical problems. The importance of weight loss, regular exercise, and strict attention to management of diabetes has been discussed and emphasized with respect to the patient's long-term prognosis. He might benefit from referral for possible weight loss surgery. All of his questions have been addressed. We have not recommended any changes to his medications at this time. In the future he will call and return to see us as needed.  I spent in excess of 15 minutes during the conduct of this office consultation and >50% of this time involved direct face-to-face encounter with the patient for counseling and/or coordination of their care.    Clarence H. Owen, MD 11/02/2015 9:55 AM     

## 2015-11-10 ENCOUNTER — Ambulatory Visit (INDEPENDENT_AMBULATORY_CARE_PROVIDER_SITE_OTHER): Payer: BLUE CROSS/BLUE SHIELD | Admitting: Family

## 2015-11-10 ENCOUNTER — Encounter: Payer: Self-pay | Admitting: Family

## 2015-11-10 VITALS — BP 179/104 | HR 86 | Temp 97.4°F | Ht 70.0 in | Wt 368.4 lb

## 2015-11-10 DIAGNOSIS — E559 Vitamin D deficiency, unspecified: Secondary | ICD-10-CM | POA: Diagnosis not present

## 2015-11-10 DIAGNOSIS — I10 Essential (primary) hypertension: Secondary | ICD-10-CM | POA: Diagnosis not present

## 2015-11-10 DIAGNOSIS — M2550 Pain in unspecified joint: Secondary | ICD-10-CM | POA: Diagnosis not present

## 2015-11-10 DIAGNOSIS — F4323 Adjustment disorder with mixed anxiety and depressed mood: Secondary | ICD-10-CM | POA: Diagnosis not present

## 2015-11-10 DIAGNOSIS — E785 Hyperlipidemia, unspecified: Secondary | ICD-10-CM | POA: Diagnosis not present

## 2015-11-10 DIAGNOSIS — E1165 Type 2 diabetes mellitus with hyperglycemia: Secondary | ICD-10-CM | POA: Diagnosis not present

## 2015-11-10 DIAGNOSIS — Z794 Long term (current) use of insulin: Secondary | ICD-10-CM

## 2015-11-10 DIAGNOSIS — I5021 Acute systolic (congestive) heart failure: Secondary | ICD-10-CM | POA: Diagnosis not present

## 2015-11-10 DIAGNOSIS — I2510 Atherosclerotic heart disease of native coronary artery without angina pectoris: Secondary | ICD-10-CM

## 2015-11-10 DIAGNOSIS — Z951 Presence of aortocoronary bypass graft: Secondary | ICD-10-CM | POA: Diagnosis not present

## 2015-11-10 DIAGNOSIS — G4733 Obstructive sleep apnea (adult) (pediatric): Secondary | ICD-10-CM | POA: Diagnosis not present

## 2015-11-10 DIAGNOSIS — E118 Type 2 diabetes mellitus with unspecified complications: Secondary | ICD-10-CM | POA: Diagnosis not present

## 2015-11-10 DIAGNOSIS — IMO0002 Reserved for concepts with insufficient information to code with codable children: Secondary | ICD-10-CM

## 2015-11-10 DIAGNOSIS — I2102 ST elevation (STEMI) myocardial infarction involving left anterior descending coronary artery: Secondary | ICD-10-CM

## 2015-11-10 LAB — POCT GLYCOSYLATED HEMOGLOBIN (HGB A1C): Hemoglobin A1C: 9.3

## 2015-11-10 MED ORDER — AMLODIPINE BESYLATE 5 MG PO TABS: 5.0000 mg | ORAL_TABLET | Freq: Every day | ORAL | Status: DC

## 2015-11-10 MED ORDER — GLUCOSE BLOOD VI STRP: ORAL_STRIP | Status: DC

## 2015-11-10 NOTE — Patient Instructions (Signed)
DASH Eating Plan °DASH stands for "Dietary Approaches to Stop Hypertension." The DASH eating plan is a healthy eating plan that has been shown to reduce high blood pressure (hypertension). Additional health benefits may include reducing the risk of type 2 diabetes mellitus, heart disease, and stroke. The DASH eating plan may also help with weight loss. °WHAT DO I NEED TO KNOW ABOUT THE DASH EATING PLAN? °For the DASH eating plan, you will follow these general guidelines: °· Choose foods with a percent daily value for sodium of less than 5% (as listed on the food label). °· Use salt-free seasonings or herbs instead of table salt or sea salt. °· Check with your health care provider or pharmacist before using salt substitutes. °· Eat lower-sodium products, often labeled as "lower sodium" or "no salt added." °· Eat fresh foods. °· Eat more vegetables, fruits, and low-fat dairy products. °· Choose whole grains. Look for the word "whole" as the first word in the ingredient list. °· Choose fish and skinless chicken or turkey more often than red meat. Limit fish, poultry, and meat to 6 oz (170 g) each day. °· Limit sweets, desserts, sugars, and sugary drinks. °· Choose heart-healthy fats. °· Limit cheese to 1 oz (28 g) per day. °· Eat more home-cooked food and less restaurant, buffet, and fast food. °· Limit fried foods. °· Cook foods using methods other than frying. °· Limit canned vegetables. If you do use them, rinse them well to decrease the sodium. °· When eating at a restaurant, ask that your food be prepared with less salt, or no salt if possible. °WHAT FOODS CAN I EAT? °Seek help from a dietitian for individual calorie needs. °Grains °Whole grain or whole wheat bread. Brown rice. Whole grain or whole wheat pasta. Quinoa, bulgur, and whole grain cereals. Low-sodium cereals. Corn or whole wheat flour tortillas. Whole grain cornbread. Whole grain crackers. Low-sodium crackers. °Vegetables °Fresh or frozen vegetables  (raw, steamed, roasted, or grilled). Low-sodium or reduced-sodium tomato and vegetable juices. Low-sodium or reduced-sodium tomato sauce and paste. Low-sodium or reduced-sodium canned vegetables.  °Fruits °All fresh, canned (in natural juice), or frozen fruits. °Meat and Other Protein Products °Ground beef (85% or leaner), grass-fed beef, or beef trimmed of fat. Skinless chicken or turkey. Ground chicken or turkey. Pork trimmed of fat. All fish and seafood. Eggs. Dried beans, peas, or lentils. Unsalted nuts and seeds. Unsalted canned beans. °Dairy °Low-fat dairy products, such as skim or 1% milk, 2% or reduced-fat cheeses, low-fat ricotta or cottage cheese, or plain low-fat yogurt. Low-sodium or reduced-sodium cheeses. °Fats and Oils °Tub margarines without trans fats. Light or reduced-fat mayonnaise and salad dressings (reduced sodium). Avocado. Safflower, olive, or canola oils. Natural peanut or almond butter. °Other °Unsalted popcorn and pretzels. °The items listed above may not be a complete list of recommended foods or beverages. Contact your dietitian for more options. °WHAT FOODS ARE NOT RECOMMENDED? °Grains °White bread. White pasta. White rice. Refined cornbread. Bagels and croissants. Crackers that contain trans fat. °Vegetables °Creamed or fried vegetables. Vegetables in a cheese sauce. Regular canned vegetables. Regular canned tomato sauce and paste. Regular tomato and vegetable juices. °Fruits °Dried fruits. Canned fruit in light or heavy syrup. Fruit juice. °Meat and Other Protein Products °Fatty cuts of meat. Ribs, chicken wings, bacon, sausage, bologna, salami, chitterlings, fatback, hot dogs, bratwurst, and packaged luncheon meats. Salted nuts and seeds. Canned beans with salt. °Dairy °Whole or 2% milk, cream, half-and-half, and cream cheese. Whole-fat or sweetened yogurt. Full-fat   cheeses or blue cheese. Nondairy creamers and whipped toppings. Processed cheese, cheese spreads, or cheese  curds. °Condiments °Onion and garlic salt, seasoned salt, table salt, and sea salt. Canned and packaged gravies. Worcestershire sauce. Tartar sauce. Barbecue sauce. Teriyaki sauce. Soy sauce, including reduced sodium. Steak sauce. Fish sauce. Oyster sauce. Cocktail sauce. Horseradish. Ketchup and mustard. Meat flavorings and tenderizers. Bouillon cubes. Hot sauce. Tabasco sauce. Marinades. Taco seasonings. Relishes. °Fats and Oils °Butter, stick margarine, lard, shortening, ghee, and bacon fat. Coconut, palm kernel, or palm oils. Regular salad dressings. °Other °Pickles and olives. Salted popcorn and pretzels. °The items listed above may not be a complete list of foods and beverages to avoid. Contact your dietitian for more information. °WHERE CAN I FIND MORE INFORMATION? °[employer]: www.nhlbi.nih.gov/health/health-topics/topics/dash/ °  °This information is not intended to replace advice given to you by your health care provider. Make sure you discuss any questions you have with your health care provider. °  °Document Released: 09/15/2011 Document Revised: 10/17/2014 Document Reviewed: 07/31/2013 °Elsevier Interactive Patient Education ©2016 [employer] ° °Hypertension °Hypertension, commonly called high blood pressure, is when the force of blood pumping through your arteries is too strong. Your arteries are the blood vessels that carry blood from your heart throughout your body. A blood pressure reading consists of a higher number over a lower number, such as 110/72. The higher number (systolic) is the pressure inside your arteries when your heart pumps. The lower number (diastolic) is the pressure inside your arteries when your heart relaxes. Ideally you want your blood pressure below 120/80. °Hypertension forces your heart to work harder to pump blood. Your arteries may become narrow or stiff. Having untreated or uncontrolled hypertension can cause heart attack, stroke, kidney  disease, and other problems. °RISK FACTORS °Some risk factors for high blood pressure are controllable. Others are not.  °Risk factors you cannot control include:  °· Race. You may be at higher risk if you are African American. °· Age. Risk increases with age. °· Gender. Men are at higher risk than women before age 45 years. After age 65, women are at higher risk than men. °Risk factors you can control include: °· Not getting enough exercise or physical activity. °· Being overweight. °· Getting too much fat, sugar, calories, or salt in your diet. °· Drinking too much alcohol. °SIGNS AND SYMPTOMS °Hypertension does not usually cause signs or symptoms. Extremely high blood pressure (hypertensive crisis) may cause headache, anxiety, shortness of breath, and nosebleed. °DIAGNOSIS °To check if you have hypertension, your health care provider will measure your blood pressure while you are seated, with your arm held at the level of your heart. It should be measured at least twice using the same arm. Certain conditions can cause a difference in blood pressure between your right and left arms. A blood pressure reading that is higher than normal on one occasion does not mean that you need treatment. If it is not clear whether you have high blood pressure, you may be asked to return on a different day to have your blood pressure checked again. Or, you may be asked to monitor your blood pressure at home for 1 or more weeks. °TREATMENT °Treating high blood pressure includes making lifestyle changes and possibly taking medicine. Living a healthy lifestyle can help lower high blood pressure. You may need to change some of your habits. °Lifestyle changes may include: °· Following the DASH diet. This diet is high in fruits, vegetables, and whole   grains. It is low in salt, red meat, and added sugars. °· Keep your sodium intake below 2,300 mg per day. °· Getting at least 30-45 minutes of aerobic exercise at least 4 times per  week. °· Losing weight if necessary. °· Not smoking. °· Limiting alcoholic beverages. °· Learning ways to reduce stress. °Your health care provider may prescribe medicine if lifestyle changes are not enough to get your blood pressure under control, and if one of the following is true: °· You are 18-59 years of age and your systolic blood pressure is above 140. °· You are 60 years of age or older, and your systolic blood pressure is above 150. °· Your diastolic blood pressure is above 90. °· You have diabetes, and your systolic blood pressure is over 140 or your diastolic blood pressure is over 90. °· You have kidney disease and your blood pressure is above 140/90. °· You have heart disease and your blood pressure is above 140/90. °Your personal target blood pressure may vary depending on your medical conditions, your age, and other factors. °HOME CARE INSTRUCTIONS °· Have your blood pressure rechecked as directed by your health care provider.   °· Take medicines only as directed by your health care provider. Follow the directions carefully. Blood pressure medicines must be taken as prescribed. The medicine does not work as well when you skip doses. Skipping doses also puts you at risk for problems. °· Do not smoke.   °· Monitor your blood pressure at home as directed by your health care provider.  °SEEK MEDICAL CARE IF:  °· You think you are having a reaction to medicines taken. °· You have recurrent headaches or feel dizzy. °· You have swelling in your ankles. °· You have trouble with your vision. °SEEK IMMEDIATE MEDICAL CARE IF: °· You develop a severe headache or confusion. °· You have unusual weakness, numbness, or feel faint. °· You have severe chest or abdominal pain. °· You vomit repeatedly. °· You have trouble breathing. °MAKE SURE YOU:  °· Understand these instructions. °· Will watch your condition. °· Will get help right away if you are not doing well or get worse. °  °This information is not intended to  replace advice given to you by your health care provider. Make sure you discuss any questions you have with your health care provider. °  °Document Released: 09/26/2005 Document Revised: 02/10/2015 Document Reviewed: 07/19/2013 °Elsevier Interactive Patient Education ©2016 [employer] ° °

## 2015-11-10 NOTE — Progress Notes (Signed)
Subjective:    Patient ID: [name], male    DOB: 1971/07/30, 45 y.o.   MRN: [id number]   Pt presents to the office today for chronic follow up. Pt is followed by his Cardiologists every 3 months. Pt is to take lasix 60 mg daily. Pt states he is suppose to weight himself daily, but he is noncompliant.   Diabetes He presents for his follow-up diabetic visit. He has type 2 diabetes mellitus. His disease course has been worsening. Pertinent negatives for hypoglycemia include no confusion, headaches or mood changes. Pertinent negatives for diabetes include no blurred vision, no foot paresthesias, no foot ulcerations and no visual change. Pertinent negatives for hypoglycemia complications include no blackouts and no hospitalization. Symptoms are worsening. Diabetic complications include a CVA and heart disease. Pertinent negatives for diabetic complications include no nephropathy or peripheral neuropathy. Risk factors for coronary artery disease include diabetes mellitus, dyslipidemia, family history, hypertension, male sex, obesity and sedentary lifestyle. Current diabetic treatment includes insulin injections and oral agent (monotherapy). He is compliant with treatment all of the time. His weight is stable. He is following a generally unhealthy diet. (Pt states he does not check his blood glucose ) An ACE inhibitor/angiotensin II receptor blocker is being taken. Eye exam is current.  Hypertension This is a chronic problem. The current episode started more than 1 year ago. The problem has been waxing and waning since onset. The problem is uncontrolled. Associated symptoms include anxiety, peripheral edema ("just a little") and shortness of breath. Pertinent negatives include no blurred vision, headaches or palpitations. Risk factors for coronary artery disease include diabetes mellitus, dyslipidemia, male gender and obesity. Past treatments include ACE inhibitors, beta blockers and diuretics. The  current treatment provides mild improvement. Hypertensive end-organ damage includes CAD/MI, CVA and heart failure. There is no history of kidney disease or a thyroid problem. There is no history of sleep apnea.  Hyperlipidemia This is a chronic problem. The current episode started more than 1 year ago. The problem is controlled. Recent lipid tests were reviewed and are normal. Exacerbating diseases include diabetes and obesity. He has no history of hypothyroidism. Associated symptoms include shortness of breath. Pertinent negatives include no leg pain or myalgias. Current antihyperlipidemic treatment includes statins. The current treatment provides significant improvement of lipids. Risk factors for coronary artery disease include diabetes mellitus, dyslipidemia, family history, hypertension, male sex, obesity and a sedentary lifestyle.  Anxiety Presents for follow-up visit. Symptoms include depressed mood, excessive worry and shortness of breath. Patient reports no confusion, dry mouth, insomnia, irritability, palpitations or panic. Symptoms occur occasionally. The severity of symptoms is moderate. The symptoms are aggravated by family issues. The quality of sleep is good.   His past medical history is significant for anxiety/panic attacks and depression. There is no history of CAD. Past treatments include SSRIs. Compliance with prior treatments has been good.  Arthritis Presents for follow-up visit. The disease course has been fluctuating. He complains of pain and joint swelling. Affected locations include the right shoulder, left elbow, left knee and right knee. His pain is at a severity of 4/10. Pertinent negatives include no dry eyes, dry mouth, dysuria or pain while resting. His past medical history is significant for osteoarthritis.      Review of Systems  Constitutional: Negative.  Negative for irritability.  HENT: Negative.   Eyes: Negative for blurred vision.  Respiratory: Positive for  shortness of breath.   Cardiovascular: Negative.  Negative for palpitations.  Gastrointestinal: Negative.  Endocrine: Negative.   Genitourinary: Negative.  Negative for dysuria.  Musculoskeletal: Positive for joint swelling and arthritis. Negative for myalgias.  Neurological: Negative.  Negative for headaches.  Hematological: Negative.   Psychiatric/Behavioral: Negative.  Negative for confusion. The patient does not have insomnia.   All other systems reviewed and are negative.      Objective:   Physical Exam  Constitutional: He is oriented to person, place, and time. He appears well-developed and well-nourished. No distress.  Pt morbid obese   HENT:  Head: Normocephalic.  Right Ear: External ear normal.  Left Ear: External ear normal.  Mouth/Throat: Oropharynx is clear and moist.  Eyes: Pupils are equal, round, and reactive to light. Right eye exhibits no discharge. Left eye exhibits no discharge.  Neck: Normal range of motion. Neck supple. No thyromegaly present.  Cardiovascular: Normal rate, regular rhythm, normal heart sounds and intact distal pulses.   No murmur heard. Pulmonary/Chest: Effort normal and breath sounds normal. No respiratory distress. He has no wheezes.  Abdominal: Soft. Bowel sounds are normal. He exhibits no distension. There is no tenderness.  Musculoskeletal: Normal range of motion. He exhibits edema (2+ in BLE). He exhibits no tenderness.  Neurological: He is alert and oriented to person, place, and time. He has normal reflexes. No cranial nerve deficit.  Skin: Skin is warm and dry. No rash noted. No erythema.  Psychiatric: He has a normal mood and affect. His behavior is normal. Judgment and thought content normal.  Vitals reviewed.     BP 179/104 mmHg  Pulse 86  Temp(Src) 97.4 F (36.3 C) (Oral)  Ht _0  (1.778 m)  Wt 368 lb 6.4 oz (167.105 kg)  BMI 52.86 kg/m2     Assessment & Plan:  1. Essential hypertension -Norvasc 5 mg added today- PT  needs to keep all appts with Cardiologists  - CMP14+EGFR - amLODipine (NORVASC) 5 MG tablet; Take 1 tablet (5 mg total) by mouth daily.  Dispense: 90 tablet; Refill: 3  2. Coronary artery disease involving native coronary artery of native heart without angina pectoris - CMP14+EGFR  3. Obstructive sleep apnea - CMP14+EGFR  4. Uncontrolled type 2 diabetes mellitus with complication, with long-term current use of insulin (HCC) -low carb diet discusses - glucose blood test strip; Use as instructed  Dispense: 100 each; Refill: 12 - POCT glycosylated hemoglobin (Hb A1C) - CMP14+EGFR  5. Vitamin D deficiency - CMP14+EGFR - VITAMIN D 25 Hydroxy (Vit-D Deficiency, Fractures)  6. Morbid obesity, unspecified obesity type ([place]) - CMP14+EGFR  7. Hyperlipidemia - CMP14+EGFR - Lipid panel  8. Adjustment disorder with mixed anxiety and depressed mood - CMP14+EGFR  9. S/P CABG x 4 - CMP14+EGFR  10. Acute systolic congestive heart failure (HCC) -Discussed the importance of weighing himself daily - CMP14+EGFR  11. ST elevation myocardial infarction (STEMI) involving left anterior descending (LAD) coronary artery with complication (HCC) - HMC94+BSJG  12. Joint pain - Arthritis Panel   Continue all meds Labs pending Health Maintenance reviewed Diet and exercise encouraged RTO 2 weeks to recheck HTN  Evelina Dun, FNP

## 2015-11-11 LAB — ARTHRITIS PANEL
BASOS ABS: 0 10*3/uL (ref 0.0–0.2)
Basos: 0 %
EOS (ABSOLUTE): 0.3 10*3/uL (ref 0.0–0.4)
Eos: 4 %
HEMOGLOBIN: 15 g/dL (ref 12.6–17.7)
Hematocrit: 43.3 % (ref 37.5–51.0)
IMMATURE GRANS (ABS): 0 10*3/uL (ref 0.0–0.1)
IMMATURE GRANULOCYTES: 0 %
LYMPHS: 15 %
Lymphocytes Absolute: 1.4 10*3/uL (ref 0.7–3.1)
MCH: 30.2 pg (ref 26.6–33.0)
MCHC: 34.6 g/dL (ref 31.5–35.7)
MCV: 87 fL (ref 79–97)
MONOCYTES: 6 %
Monocytes Absolute: 0.6 10*3/uL (ref 0.1–0.9)
NEUTROS ABS: 7.1 10*3/uL — AB (ref 1.4–7.0)
NEUTROS PCT: 75 %
Platelets: 206 10*3/uL (ref 150–379)
RBC: 4.96 x10E6/uL (ref 4.14–5.80)
RDW: 13.7 % (ref 12.3–15.4)
Rhuematoid fact SerPl-aCnc: <10 IU/mL (ref 0.0–13.9)
SED RATE: 10 mm/h (ref 0–15)
Uric Acid: 8.5 mg/dL (ref 3.7–8.6)
WBC: 9.4 10*3/uL (ref 3.4–10.8)

## 2015-11-11 LAB — LIPID PANEL
CHOLESTEROL TOTAL: 104 mg/dL (ref 100–199)
Chol/HDL Ratio: 4.2 ratio units (ref 0.0–5.0)
HDL: 25 mg/dL — ABNORMAL LOW (ref >39)
LDL CALC: 62 mg/dL (ref 0–99)
TRIGLYCERIDES: 84 mg/dL (ref 0–149)
VLDL Cholesterol Cal: 17 mg/dL (ref 5–40)

## 2015-11-11 LAB — CMP14+EGFR
ALK PHOS: 175 IU/L — AB (ref 39–117)
ALT: 30 IU/L (ref 0–44)
AST: 17 IU/L (ref 0–40)
Albumin/Globulin Ratio: 1.5 (ref 1.1–2.5)
Albumin: 4.1 g/dL (ref 3.5–5.5)
BUN/Creatinine Ratio: 16 (ref 9–20)
BUN: 16 mg/dL (ref 6–24)
Bilirubin Total: 0.8 mg/dL (ref 0.0–1.2)
CHLORIDE: 97 mmol/L (ref 96–106)
CO2: 25 mmol/L (ref 18–29)
CREATININE: 0.98 mg/dL (ref 0.76–1.27)
Calcium: 9 mg/dL (ref 8.7–10.2)
GFR calc Af Amer: 109 mL/min/{1.73_m2} (ref >59)
GFR calc non Af Amer: 94 mL/min/{1.73_m2} (ref >59)
GLOBULIN, TOTAL: 2.8 g/dL (ref 1.5–4.5)
GLUCOSE: 189 mg/dL — AB (ref 65–99)
Potassium: 5 mmol/L (ref 3.5–5.2)
SODIUM: 137 mmol/L (ref 134–144)
Total Protein: 6.9 g/dL (ref 6.0–8.5)

## 2015-11-11 LAB — VITAMIN D 25 HYDROXY (VIT D DEFICIENCY, FRACTURES): Vit D, 25-Hydroxy: 46.1 ng/mL (ref 30.0–100.0)

## 2015-11-12 ENCOUNTER — Other Ambulatory Visit: Payer: Self-pay | Admitting: Family

## 2015-11-17 ENCOUNTER — Encounter: Payer: Self-pay | Admitting: Cardiology

## 2015-11-17 ENCOUNTER — Ambulatory Visit (INDEPENDENT_AMBULATORY_CARE_PROVIDER_SITE_OTHER): Payer: BLUE CROSS/BLUE SHIELD | Admitting: Cardiology

## 2015-11-17 VITALS — BP 163/89 | HR 88 | Ht 71.0 in | Wt 368.6 lb

## 2015-11-17 DIAGNOSIS — I2510 Atherosclerotic heart disease of native coronary artery without angina pectoris: Secondary | ICD-10-CM | POA: Diagnosis not present

## 2015-11-17 DIAGNOSIS — I5023 Acute on chronic systolic (congestive) heart failure: Secondary | ICD-10-CM

## 2015-11-17 MED ORDER — FUROSEMIDE 20 MG PO TABS: 60.0000 mg | ORAL_TABLET | Freq: Two times a day (BID) | ORAL | Status: DC

## 2015-11-17 NOTE — Progress Notes (Signed)
Patient ID: Mark Leach, male   DOB: 05-Nov-1970, 45 y.o.   MRN: [id number]     Clinical Summary Mark Leach is a 45 y.o.male seen today for follow up of the following medical problems.   1. CAD/Chronic systolic HF/ICM - hx of prior anterior STEMI in April 2014 at [facility], received stent to LAD - admit Jan 2016 with MI, cath severe CAD as described below with LVEF by LV gram 25% referred for CABG - TEE LVEF 25-30% - s/p CABG Jan 2016 (LIMA-LAD, SVG-diag, SVG-PDA,SVG-OM) - seen by EP 02/2015 for ICD evaluation, patient requested to hold off at that time.   - last visit we increased his lasix to '60mg'$  daily due to weight gain . He was to call our office and update [place] on his weights but never did. Swelling and weight have continued to progress.  - not weighing himself at home.    2. OSA  - compliant with CPAP Past Medical History  Diagnosis Date  . Coronary artery disease 01/2013    anterior MI with cath showing 95% pLAD, 90% diag, 30% OM1, 70% mRCA, 60% dRCA s/p PCI with DES to LAD and diag and EF 40%  . Ischemic dilated cardiomyopathy   . Hypertension   . Morbid obesity ([place])   . Chronic systolic CHF (congestive heart failure) ([place])   . Visual field defect 10/12/2014  . Diabetes mellitus type 2, uncontrolled, with complications ([place])   . Stroke [facility]) 10/12/2014    Patient with several month h/o left sided visual field deficit and MRI of brain revealing:  1. Medial right occipital lobe subacute infarct. 2. At least 3 punctate areas of acute nonhemorrhagic infarct are present involving the left thalamus, high posterior right frontal lobe and high a medial posterior left frontal or parietal lobe. 3. Additional white matter changes are noted beyond the areas of  . 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 DJD (degenerative joint disease)     knees     Allergies  Allergen Reactions  . Nitroglycerin Other (See Comments)   Hypotension, syncope, bradycardia     Current Outpatient Prescriptions  Medication Sig Dispense Refill  . amLODipine (NORVASC) 5 MG tablet Take 1 tablet (5 mg total) by mouth daily. 90 tablet 3  . aspirin 81 MG tablet Take 81 mg by mouth daily.    Marland Kitchen atorvastatin (LIPITOR) 80 MG tablet Take 1 tablet (80 mg total) by mouth daily at 6 PM. 30 tablet 6  . citalopram (CELEXA) 40 MG tablet TAKE ONE TABLET BY MOUTH AT BEDTIME 30 tablet 1  . furosemide (LASIX) 20 MG tablet Take 3 tablets (60 mg total) by mouth daily. 90 tablet 3  . glucose blood test strip Use as instructed 100 each 12  . Insulin Detemir (LEVEMIR) 100 UNIT/ML Pen Inject 50 Units into the skin daily at 10 pm. (Patient taking differently: Inject 50 Units into the skin daily at 10 pm. Sometimes uses levemir pen) 15 mL 11  . Insulin Pen Needle 31G X 8 MM MISC Use every night at bedtime 100 each 11  . insulin starter kit- pen needles MISC 1 kit by Other route once. 1 kit 10  . insulin starter kit- syringes MISC 1 kit by Other route once. 1 kit 10  . Insulin Syringes, Disposable, U-100 1 ML MISC Use once daily 60 each 11  . losartan (COZAAR)  100 MG tablet Take 1 tablet (100 mg total) by mouth daily. 90 tablet 3  . magnesium oxide (MAG-OX) 400 MG tablet Take 1 tab 2 times daily for 5 days then 1 tab daily 40 tablet 3  . metFORMIN (GLUCOPHAGE) 1000 MG tablet Take 1 tablet (1,000 mg total) by mouth 2 (two) times daily with a meal. 180 tablet 1  . metoprolol (TOPROL-XL) 200 MG 24 hr tablet TAKE 1 TABLET ONCE DAILY. 30 tablet 3  . Misc Natural Products (BLACK CHERRY CONCENTRATE PO) Take 1 tablet by mouth daily as needed (gout flare-up).     Marland Kitchen spironolactone (ALDACTONE) 25 MG tablet Take 0.5 tablets (12.5 mg total) by mouth daily. 90 tablet 3  . Vitamin D, Ergocalciferol, (DRISDOL) 50000 UNITS CAPS capsule Take 1 capsule (50,000 Units total) by mouth every 7 (seven) days. 12 capsule 3   No current facility-administered medications for this  visit.     Past Surgical History  Procedure Laterality Date  . Cardiac catheterization  01/2013    [facility] in [place]  . Coronary angioplasty  01/2013    PCI with stenting of LAD  . Cholecystectomy    . Left heart cath N/A 10/10/2014    Procedure: LEFT HEART CATH;  Surgeon: Troy Sine, MD;  Location: [facility] CATH LAB;  Service: Cardiovascular;  Laterality: N/A;  . Coronary artery bypass graft N/A 10/13/2014    Procedure: CORONARY ARTERY BYPASS GRAFTING (CABG), ON PUMP, TIMES FOUR, USING LEFT INTERNAL MAMMARY ARTERY, BILATERAL GREATER SAPHENOUS VEINS HARVESTED ENDOSCOPICALLY;  Surgeon: Rexene Alberts, MD;  Location: [place];  Service: Open Heart Surgery;  Laterality: N/A;  . Multiple extractions with alveoloplasty N/A 10/22/2014    Procedure: Extraction of tooth #'s 1,3,4,5,6,7,8,9,10,11,12,13,14,15,16,20,21,22,24, 25,26,27,28,29,30,32 with alveoloplasty.;  Surgeon: Lenn Cal, DDS;  Location: [place];  Service: Oral Surgery;  Laterality: N/A;     Allergies  Allergen Reactions  . Nitroglycerin Other (See Comments)    Hypotension, syncope, bradycardia      Family History  Problem Relation Age of Onset  . Heart attack Brother 43  . Diabetes Brother   . Arthritis Mother   . Asthma Mother   . Heart disease Maternal Uncle   . Heart disease Maternal Grandfather   . Alcohol abuse Maternal Grandfather      Social History Mark Leach reports that he quit smoking about 8 years ago. His smoking use included Cigarettes. He started smoking about 25 years ago. He has a 18 pack-year smoking history. He has never used smokeless tobacco. Mark Leach reports that he does not drink alcohol.   Review of Systems CONSTITUTIONAL: No weight loss, fever, chills, weakness or fatigue.  HEENT: Eyes: No visual loss, blurred vision, double vision or yellow sclerae.No hearing loss, sneezing, congestion, runny nose or sore throat.  SKIN: No rash or itching.  CARDIOVASCULAR: no chest pain, no  palpitations RESPIRATORY: No cough or sputum.  GASTROINTESTINAL: No anorexia, nausea, vomiting or diarrhea. No abdominal pain or blood.  GENITOURINARY: No burning on urination, no polyuria NEUROLOGICAL: No headache, dizziness, syncope, paralysis, ataxia, numbness or tingling in the extremities. No change in bowel or bladder control.  MUSCULOSKELETAL: No muscle, back pain, joint pain or stiffness.  LYMPHATICS: No enlarged nodes. No history of splenectomy.  PSYCHIATRIC: No history of depression or anxiety.  ENDOCRINOLOGIC: No reports of sweating, cold or heat intolerance. No polyuria or polydipsia.  Marland Kitchen   Physical Examination Filed Vitals:   11/17/15 1600  BP: 163/89  Pulse: 88  Filed Vitals:   11/17/15 1600  Height: '5\' 11"'$  (1.803 m)  Weight: 368 lb 9.6 oz (167.196 kg)    Gen: resting comfortably, no acute distress HEENT: no scleral icterus, pupils equal round and reactive, no palptable cervical adenopathy,  CV: RRR, no m/r/g, no jvd Resp: Clear to auscultation bilaterally GI: abdomen is soft, non-tender, non-distended, normal bowel sounds, no hepatosplenomegaly MSK: extremities are warm, 2+ bilateral LE edema Skin: warm, no rash Neuro:  no focal deficits Psych: appropriate affect   Diagnostic Studies 10/2014 Cath ANGIOGRAPHY:  Left main: Moderate size vessel which trifurcated into an LAD and intermediate vessel and left circumflex coronary artery.  LAD: The LAD was subtotally occluded at its ostium and there was diffuse 95-99% ostial proximal stenosis and then was totally occluded in the region of the first diagonal Rheannon Cerney. There was a gap and then faint filling of a second diagonal Francesa Eugenio which had a stent and there was faint filling of the LAD beyond the diagonal vessel via collaterals.  [place] Intermediate: Small caliber vessel free of significant disease.  Left circumflex: Large caliber vessel that gave rise to 2 major marginal branches and then in the posterior lateral  like coronary artery. The first marginal Vernisha Bacote was moderate size and had proximal 90% followed by 80% stenoses. A distal superior Haiven Nardone had 95% stenosis.  Right coronary artery: Moderate size vessel that had 95% mid stenosis and 80% distal stenosis in the region of the acute margin. The vessel supplied the PDA. There were septal collaterals to the LAD.   Left ventriculography revealed dilated ventricle with an ejection fraction of 25% with diffuse global hypokinesis   IMPRESSION:  Severe ischemic dilated cardiomyopathy with an ejection fraction approximately 25%.  Severe multivessel coronary obstructive disease with diffusely diseased subtotal occlusion of the ostial and proximal LAD with faint collaterals to the first and second diagonal vessel and more distal LAD; left circumflex coronary artery with tandem 90 and 80% obtuse marginal 1 stenosis with distal 95% stenosis in this distal superior Merna Baldi of this marginal vessel; and 95% mid RCA stenosis with 80% stenosis in the region of the acute margin with evidence for septal collaterals from the PDA to the LAD.  RECOMMENDATION:  The patient has surgical anatomy and CABG revascularization surgery will be recommended with surgical consultation in the morning. The patient was started on Aggrastat post procedure to reduce likelihood of progressive thrombosis. An attempt was made to initiate low-dose IV nitroglycerin, but the patient transiently dropped his blood pressure and this was discontinued.  10/2014 TEE Study Conclusions  - Left ventricle: Septal apical and anterior wall hypokinesis The cavity size was severely dilated. Systolic function was severely reduced. The estimated ejection fraction was in the range of 25% to 30%. - Aortic valve: There was trivial regurgitation. - Left atrium: The atrium was mildly dilated. - Atrial septum: No defect or patent foramen ovale was identified. - Pericardium, extracardiac: Small  pericardial effusion IVC flat no evidence of tamponade.    02/2015 Echo Study Conclusions  - Left ventricle: The cavity size was mildly dilated. There was mild concentric hypertrophy. Systolic function was moderately to severely reduced. The estimated ejection fraction was in the range of 30% to 35%. Contrast enhancement utilized. Doppler parameters are consistent with abnormal left ventricular relaxation (grade 1 diastolic dysfunction). Doppler parameters are consistent with high ventricular filling pressure. - Regional wall motion abnormality: Akinesis of the mid anterior, basal-mid anteroseptal, apical septal, and apical myocardium; severe hypokinesis of the mid inferoseptal  myocardium; moderate hypokinesis of the apical lateral myocardium; mild hypokinesis of the mid anterolateral myocardium. - Aortic valve: Mildly to moderately calcified annulus. - Aorta: Mild to moderate aortic root dilatation. Aortic root dimension: 44 mm (ED). - Mitral valve: Mildly calcified annulus. Mildly thickened leaflets . - Left atrium: The atrium was mildly dilated. Volume/bsa, ES, (1-plane Simpson&'s, A2C): 33.2 ml/m^2. - Right ventricle: Systolic function was mildly reduced.     Assessment and Plan   1. CAD/Acute on Chronic systolic HF/ICM - recent MI, found to have severe multivessel disease, s/p 4 vessel CABG Jan 2016. Echo 02/2015 LVEF 30-35% - he has elected to hold off on ICD for now, will continue to follow at this time - significantly increased weight and edema. I have not been able to get him to check daily weight or to update [place] if the changes we make in his diuretics do not improve his edema. Discussed the importance both activities in order to improve his heart failure. Will increase lasix to '60mg'$  in AM and '40mg'$  in PM. It appears that he may have some home health in the works that would also help.    F/u 2-3 weeks.       Arnoldo Lenis, M.D.,  F.A.C.C.

## 2015-11-17 NOTE — Patient Instructions (Signed)
Your physician recommends that you schedule a follow-up appointment in: 2-3 weeks with Dr. Wyline Mood   Your physician has recommended you make the following change in your medication:   INCREASE LASIX 60 MG TWICE DAILY  Thank you for choosing [facility]!!

## 2015-11-18 ENCOUNTER — Other Ambulatory Visit: Payer: Self-pay | Admitting: Physician Assistant

## 2015-11-23 ENCOUNTER — Other Ambulatory Visit: Payer: Self-pay | Admitting: Physician Assistant

## 2015-11-24 ENCOUNTER — Encounter: Payer: Self-pay | Admitting: Family

## 2015-11-24 ENCOUNTER — Other Ambulatory Visit: Payer: Self-pay | Admitting: Cardiology

## 2015-11-24 ENCOUNTER — Ambulatory Visit (INDEPENDENT_AMBULATORY_CARE_PROVIDER_SITE_OTHER): Payer: BLUE CROSS/BLUE SHIELD | Admitting: Family

## 2015-11-24 VITALS — BP 132/81 | HR 92 | Temp 97.1°F | Ht 71.0 in | Wt 360.8 lb

## 2015-11-24 DIAGNOSIS — Z794 Long term (current) use of insulin: Secondary | ICD-10-CM

## 2015-11-24 DIAGNOSIS — I420 Dilated cardiomyopathy: Principal | ICD-10-CM

## 2015-11-24 DIAGNOSIS — I255 Ischemic cardiomyopathy: Secondary | ICD-10-CM | POA: Diagnosis not present

## 2015-11-24 DIAGNOSIS — E1165 Type 2 diabetes mellitus with hyperglycemia: Secondary | ICD-10-CM

## 2015-11-24 DIAGNOSIS — I10 Essential (primary) hypertension: Secondary | ICD-10-CM | POA: Diagnosis not present

## 2015-11-24 DIAGNOSIS — IMO0002 Reserved for concepts with insufficient information to code with codable children: Secondary | ICD-10-CM

## 2015-11-24 DIAGNOSIS — E118 Type 2 diabetes mellitus with unspecified complications: Secondary | ICD-10-CM | POA: Diagnosis not present

## 2015-11-24 NOTE — Progress Notes (Addendum)
Subjective:    Patient ID: Mark Leach, male    DOB: Jul 04, 1971, 45 y.o.   MRN: [id number]   PT presents to the office today to recheck HTN. PT's BP is at goal today. PT saw the Cardiologists on 11/17/15 and had his lasix increased to 60 mg BID from 60 mg daily. Pt has a 8 lb lost since our visit on 11/10/15. Pt's states his breathing is the same and his "swelling is improving, but still has swelling".  Hypertension This is a chronic problem. The current episode started more than 1 year ago. The problem has been gradually improving since onset. The problem is controlled. Pertinent negatives include no anxiety, chest pain, malaise/fatigue, orthopnea, palpitations or shortness of breath. Agents associated with hypertension include NSAIDs. Risk factors for coronary artery disease include diabetes mellitus, dyslipidemia, family history, male gender, obesity and sedentary lifestyle (walking 1 hour 3 times a week). The current treatment provides moderate improvement. Compliance problems include diet (daily weight checks, ).  Hypertensive end-organ damage includes CAD/MI, CVA and heart failure. There is no history of a thyroid problem. loss of peripheral vision left vision field bilateraly . Identifiable causes of hypertension include sleep apnea. There is no history of chronic renal disease or a hypertension causing med.      Review of Systems  Constitutional: Negative.  Negative for fever, malaise/fatigue and fatigue.  HENT: Positive for dental problem.   Respiratory: Positive for wheezing. Negative for chest tightness and shortness of breath.   Cardiovascular: Positive for leg swelling. Negative for chest pain, palpitations and orthopnea.  Gastrointestinal: Negative.   Endocrine: Negative.   Genitourinary: Negative.   Musculoskeletal: Positive for arthralgias.       Knee pain  Allergic/Immunologic: Negative.   Neurological: Negative.   Hematological: Negative.   Psychiatric/Behavioral:  Negative.   All other systems reviewed and are negative.      Objective:   Physical Exam  Constitutional: He is oriented to person, place, and time. He appears well-developed and well-nourished. No distress.  HENT:  Head: Normocephalic.  Eyes: Pupils are equal, round, and reactive to light. Right eye exhibits no discharge. Left eye exhibits no discharge.  Neck: Normal range of motion. Neck supple. No thyromegaly present.  Cardiovascular: Normal rate, regular rhythm, normal heart sounds and intact distal pulses.   No murmur heard. Pulmonary/Chest: Effort normal and breath sounds normal. No respiratory distress. He has no wheezes.  Abdominal: Soft. Bowel sounds are normal. He exhibits no distension. There is no tenderness.  Musculoskeletal: Normal range of motion. He exhibits edema (3+ BLE). He exhibits no tenderness.  Neurological: He is alert and oriented to person, place, and time. He has normal reflexes. No cranial nerve deficit.  Skin: Skin is warm and dry. No rash noted. No erythema.  Psychiatric: He has a normal mood and affect. His behavior is normal. Judgment and thought content normal.  Vitals reviewed.     BP 132/81 mmHg  Pulse 92  Temp(Src) 97.1 F (36.2 C) (Oral)  Ht '5\' 11"'$  (1.803 m)  Wt 360 lb 12.8 oz (163.658 kg)  BMI 50.34 kg/m2     Assessment & Plan:  1. Ischemic dilated cardiomyopathy - BMP8+EGFR  2. Essential hypertension -Daily blood pressure log given with instructions on how to fill out and told to bring to next visit -Dash diet information given -Exercise encouraged - Stress Management  -Continue current meds -RTO in 3 months - BMP8+EGFR  3. Morbid obesity, unspecified obesity type ([place]) - BMP8+EGFR  4. Uncontrolled type 2 diabetes mellitus with complication, with long-term current use of insulin ([facility]) -PT needs to be on low carb diet and take medications every day -Pt needs appt with Tammy to discuss DM - Microalbumin / creatinine urine  ratio  Long discussion about taking the importance taking medications everyday, low salt diet for HTN and CHF, weighing himself daily. Pt needs to keep cardiologists appt Labs pending RTO prn  Evelina Dun, FNP

## 2015-11-24 NOTE — Addendum Note (Signed)
Addended by: Jannifer Rodney A on: 11/24/2015 10:50 AM   Modules accepted: Orders, SmartSet

## 2015-11-24 NOTE — Patient Instructions (Signed)
DASH Eating Plan °DASH stands for "Dietary Approaches to Stop Hypertension." The DASH eating plan is a healthy eating plan that has been shown to reduce high blood pressure (hypertension). Additional health benefits may include reducing the risk of type 2 diabetes mellitus, heart disease, and stroke. The DASH eating plan may also help with weight loss. °WHAT DO I NEED TO KNOW ABOUT THE DASH EATING PLAN? °For the DASH eating plan, you will follow these general guidelines: °· Choose foods with a percent daily value for sodium of less than 5% (as listed on the food label). °· Use salt-free seasonings or herbs instead of table salt or sea salt. °· Check with your health care provider or pharmacist before using salt substitutes. °· Eat lower-sodium products, often labeled as "lower sodium" or "no salt added." °· Eat fresh foods. °· Eat more vegetables, fruits, and low-fat dairy products. °· Choose whole grains. Look for the word "whole" as the first word in the ingredient list. °· Choose fish and skinless chicken or turkey more often than red meat. Limit fish, poultry, and meat to 6 oz (170 g) each day. °· Limit sweets, desserts, sugars, and sugary drinks. °· Choose heart-healthy fats. °· Limit cheese to 1 oz (28 g) per day. °· Eat more home-cooked food and less restaurant, buffet, and fast food. °· Limit fried foods. °· Cook foods using methods other than frying. °· Limit canned vegetables. If you do use them, rinse them well to decrease the sodium. °· When eating at a restaurant, ask that your food be prepared with less salt, or no salt if possible. °WHAT FOODS CAN I EAT? °Seek help from a dietitian for individual calorie needs. °Grains °Whole grain or whole wheat bread. Brown rice. Whole grain or whole wheat pasta. Quinoa, bulgur, and whole grain cereals. Low-sodium cereals. Corn or whole wheat flour tortillas. Whole grain cornbread. Whole grain crackers. Low-sodium crackers. °Vegetables °Fresh or frozen vegetables  (raw, steamed, roasted, or grilled). Low-sodium or reduced-sodium tomato and vegetable juices. Low-sodium or reduced-sodium tomato sauce and paste. Low-sodium or reduced-sodium canned vegetables.  °Fruits °All fresh, canned (in natural juice), or frozen fruits. °Meat and Other Protein Products °Ground beef (85% or leaner), grass-fed beef, or beef trimmed of fat. Skinless chicken or turkey. Ground chicken or turkey. Pork trimmed of fat. All fish and seafood. Eggs. Dried beans, peas, or lentils. Unsalted nuts and seeds. Unsalted canned beans. °Dairy °Low-fat dairy products, such as skim or 1% milk, 2% or reduced-fat cheeses, low-fat ricotta or cottage cheese, or plain low-fat yogurt. Low-sodium or reduced-sodium cheeses. °Fats and Oils °Tub margarines without trans fats. Light or reduced-fat mayonnaise and salad dressings (reduced sodium). Avocado. Safflower, olive, or canola oils. Natural peanut or almond butter. °Other °Unsalted popcorn and pretzels. °The items listed above may not be a complete list of recommended foods or beverages. Contact your dietitian for more options. °WHAT FOODS ARE NOT RECOMMENDED? °Grains °White bread. White pasta. White rice. Refined cornbread. Bagels and croissants. Crackers that contain trans fat. °Vegetables °Creamed or fried vegetables. Vegetables in a cheese sauce. Regular canned vegetables. Regular canned tomato sauce and paste. Regular tomato and vegetable juices. °Fruits °Dried fruits. Canned fruit in light or heavy syrup. Fruit juice. °Meat and Other Protein Products °Fatty cuts of meat. Ribs, chicken wings, bacon, sausage, bologna, salami, chitterlings, fatback, hot dogs, bratwurst, and packaged luncheon meats. Salted nuts and seeds. Canned beans with salt. °Dairy °Whole or 2% milk, cream, half-and-half, and cream cheese. Whole-fat or sweetened yogurt. Full-fat   cheeses or blue cheese. Nondairy creamers and whipped toppings. Processed cheese, cheese spreads, or cheese  curds. °Condiments °Onion and garlic salt, seasoned salt, table salt, and sea salt. Canned and packaged gravies. Worcestershire sauce. Tartar sauce. Barbecue sauce. Teriyaki sauce. Soy sauce, including reduced sodium. Steak sauce. Fish sauce. Oyster sauce. Cocktail sauce. Horseradish. Ketchup and mustard. Meat flavorings and tenderizers. Bouillon cubes. Hot sauce. Tabasco sauce. Marinades. Taco seasonings. Relishes. °Fats and Oils °Butter, stick margarine, lard, shortening, ghee, and bacon fat. Coconut, palm kernel, or palm oils. Regular salad dressings. °Other °Pickles and olives. Salted popcorn and pretzels. °The items listed above may not be a complete list of foods and beverages to avoid. Contact your dietitian for more information. °WHERE CAN I FIND MORE INFORMATION? °[employer]: www.nhlbi.nih.gov/health/health-topics/topics/dash/ °  °This information is not intended to replace advice given to you by your health care provider. Make sure you discuss any questions you have with your health care provider. °  °Document Released: 09/15/2011 Document Revised: 10/17/2014 Document Reviewed: 07/31/2013 °Elsevier Interactive Patient Education ©2016 [employer] ° °Hypertension °Hypertension, commonly called high blood pressure, is when the force of blood pumping through your arteries is too strong. Your arteries are the blood vessels that carry blood from your heart throughout your body. A blood pressure reading consists of a higher number over a lower number, such as 110/72. The higher number (systolic) is the pressure inside your arteries when your heart pumps. The lower number (diastolic) is the pressure inside your arteries when your heart relaxes. Ideally you want your blood pressure below 120/80. °Hypertension forces your heart to work harder to pump blood. Your arteries may become narrow or stiff. Having untreated or uncontrolled hypertension can cause heart attack, stroke, kidney  disease, and other problems. °RISK FACTORS °Some risk factors for high blood pressure are controllable. Others are not.  °Risk factors you cannot control include:  °· Race. You may be at higher risk if you are African American. °· Age. Risk increases with age. °· Gender. Men are at higher risk than women before age 45 years. After age 65, women are at higher risk than men. °Risk factors you can control include: °· Not getting enough exercise or physical activity. °· Being overweight. °· Getting too much fat, sugar, calories, or salt in your diet. °· Drinking too much alcohol. °SIGNS AND SYMPTOMS °Hypertension does not usually cause signs or symptoms. Extremely high blood pressure (hypertensive crisis) may cause headache, anxiety, shortness of breath, and nosebleed. °DIAGNOSIS °To check if you have hypertension, your health care provider will measure your blood pressure while you are seated, with your arm held at the level of your heart. It should be measured at least twice using the same arm. Certain conditions can cause a difference in blood pressure between your right and left arms. A blood pressure reading that is higher than normal on one occasion does not mean that you need treatment. If it is not clear whether you have high blood pressure, you may be asked to return on a different day to have your blood pressure checked again. Or, you may be asked to monitor your blood pressure at home for 1 or more weeks. °TREATMENT °Treating high blood pressure includes making lifestyle changes and possibly taking medicine. Living a healthy lifestyle can help lower high blood pressure. You may need to change some of your habits. °Lifestyle changes may include: °· Following the DASH diet. This diet is high in fruits, vegetables, and whole   grains. It is low in salt, red meat, and added sugars. °· Keep your sodium intake below 2,300 mg per day. °· Getting at least 30-45 minutes of aerobic exercise at least 4 times per  week. °· Losing weight if necessary. °· Not smoking. °· Limiting alcoholic beverages. °· Learning ways to reduce stress. °Your health care provider may prescribe medicine if lifestyle changes are not enough to get your blood pressure under control, and if one of the following is true: °· You are 18-59 years of age and your systolic blood pressure is above 140. °· You are 60 years of age or older, and your systolic blood pressure is above 150. °· Your diastolic blood pressure is above 90. °· You have diabetes, and your systolic blood pressure is over 140 or your diastolic blood pressure is over 90. °· You have kidney disease and your blood pressure is above 140/90. °· You have heart disease and your blood pressure is above 140/90. °Your personal target blood pressure may vary depending on your medical conditions, your age, and other factors. °HOME CARE INSTRUCTIONS °· Have your blood pressure rechecked as directed by your health care provider.   °· Take medicines only as directed by your health care provider. Follow the directions carefully. Blood pressure medicines must be taken as prescribed. The medicine does not work as well when you skip doses. Skipping doses also puts you at risk for problems. °· Do not smoke.   °· Monitor your blood pressure at home as directed by your health care provider.  °SEEK MEDICAL CARE IF:  °· You think you are having a reaction to medicines taken. °· You have recurrent headaches or feel dizzy. °· You have swelling in your ankles. °· You have trouble with your vision. °SEEK IMMEDIATE MEDICAL CARE IF: °· You develop a severe headache or confusion. °· You have unusual weakness, numbness, or feel faint. °· You have severe chest or abdominal pain. °· You vomit repeatedly. °· You have trouble breathing. °MAKE SURE YOU:  °· Understand these instructions. °· Will watch your condition. °· Will get help right away if you are not doing well or get worse. °  °This information is not intended to  replace advice given to you by your health care provider. Make sure you discuss any questions you have with your health care provider. °  °Document Released: 09/26/2005 Document Revised: 02/10/2015 Document Reviewed: 07/19/2013 °Elsevier Interactive Patient Education ©2016 [employer] ° °

## 2015-11-25 LAB — BMP8+EGFR
BUN / CREAT RATIO: 21 — AB (ref 9–20)
BUN: 24 mg/dL (ref 6–24)
CO2: 26 mmol/L (ref 18–29)
CREATININE: 1.13 mg/dL (ref 0.76–1.27)
Calcium: 9.5 mg/dL (ref 8.7–10.2)
Chloride: 92 mmol/L — ABNORMAL LOW (ref 96–106)
GFR, EST AFRICAN AMERICAN: 92 mL/min/{1.73_m2} (ref >59)
GFR, EST NON AFRICAN AMERICAN: 79 mL/min/{1.73_m2} (ref >59)
Glucose: 233 mg/dL — ABNORMAL HIGH (ref 65–99)
Potassium: 4.5 mmol/L (ref 3.5–5.2)
Sodium: 138 mmol/L (ref 134–144)

## 2015-11-25 LAB — MICROALBUMIN / CREATININE URINE RATIO
Creatinine, Urine: 35.5 mg/dL
MICROALB/CREAT RATIO: <8.5 mg/g creat (ref 0.0–30.0)
Microalbumin, Urine: <3 ug/mL

## 2015-12-03 ENCOUNTER — Ambulatory Visit (INDEPENDENT_AMBULATORY_CARE_PROVIDER_SITE_OTHER): Payer: BLUE CROSS/BLUE SHIELD | Admitting: Pharmacist

## 2015-12-03 ENCOUNTER — Encounter: Payer: Self-pay | Admitting: Pharmacist

## 2015-12-03 DIAGNOSIS — E118 Type 2 diabetes mellitus with unspecified complications: Secondary | ICD-10-CM

## 2015-12-03 DIAGNOSIS — IMO0002 Reserved for concepts with insufficient information to code with codable children: Secondary | ICD-10-CM

## 2015-12-03 DIAGNOSIS — E1165 Type 2 diabetes mellitus with hyperglycemia: Secondary | ICD-10-CM

## 2015-12-03 DIAGNOSIS — Z794 Long term (current) use of insulin: Secondary | ICD-10-CM

## 2015-12-03 MED ORDER — GLUCOSE BLOOD VI STRP: ORAL_STRIP | Status: DC

## 2015-12-03 NOTE — Progress Notes (Signed)
Subjective:    Mark Leach is a 45 y.o. male who presents for an initial evaluation of Type 2 diabetes mellitus.  Current symptoms/problems include hyperglycemia and polyuria   The patient was initially diagnosed with Type 2 diabetes mellitus about 1 and 1/2 years ago.   Known diabetic complications: cardiovascular disease and cerebrovascular disease Cardiovascular risk factors: diabetes mellitus, dyslipidemia, hypertension, male gender, obesity (BMI >= 30 kg/m2) and sedentary lifestyle Current diabetic medications include metformin  bid (patient states he was missing doses sevarel times a day prior to January 2017 but now uses medication alarm on phone and has been more complianct), Levemir 50 units once daily.   Eye exam current (within one year): yes Weight trend: decreasing steadily - has lost about 6# over last 2 weeks (but has gained weight over the last year - about 20#) Prior visit with dietician: yes  Current diet:  patient is trying to limit portion sizes.  He is avoiding high fat meats and fried foods.  Current exercise: none  Current monitoring regimen: performing infrequently due to one touch test strips no being covered by his insurance. Home blood sugar records: did not bring in record but states BG was 280 this am Any episodes of hypoglycemia? no  Is He on ACE inhibitor or angiotensin II receptor blocker?  Yes    losartan (Cozaar)  The following portions of the patient's history were reviewed and updated as appropriate: allergies, current medications, past family history, past medical history, past social history, past surgical history and problem list.   Objective:    BP 132/80 mmHg  Pulse 85  Ht  (1.803 m)  Wt 355 lb 8 oz (161.254 kg)  BMI 49.60 kg/m2   Date A1c  11/10/2015 9.3%  08/07/2015 8.0%  04/16/2015 6.8%  11/20/2014 6.5%      Lab Review GLUCOSE (mg/dL)  Date Value  16/07/9603 233*  11/10/2015 189*  08/07/2015 197*   GLUCOSE,  BLD (mg/dL)  Date Value  54/06/8118 192*  10/23/2014 132*  10/22/2014 129*   CO2 (mmol/L)  Date Value  11/24/2015 26  11/10/2015 25  08/07/2015 27   BUN (mg/dL)  Date Value  14/78/2956 24  11/10/2015 16  08/07/2015 16  10/26/2014 22  10/23/2014 18  10/22/2014 30*   CREATININE, SER (mg/dL)  Date Value  21/30/8657 1.13  11/10/2015 0.98  08/07/2015 1.01    Assessment:    Diabetes Mellitus type II, under inadequate control.   Medication management / non compliance Obesity  Plan:    1.  Rx changes:  Increase metformin back to  bid and discussed importance of taking regularly  I gave patient sample for Tresiba 100u/ml to try as it appears that it is a tier 3 (Levemir is tier 4) - he is instructed to take same as Levemir 50 units daily.  Rx for Contour Next test strips given - checked BG up to tid and patient also given Contour Next glucometer in office today 2.  Reviewed HBG goals.  Patient is instructed to call office if BG is greater than 200 twice within 1 week for medication adjustment.   3.  Reviewed CHO counting with patient.  Discussed 15 gram CHO serving sizes and handout given.  Patient has follow up with dietician at [facility] in 1 month.  4.  Increase physical activity as able - try to walk 10 minutes daily.  5.  Discussed future medication considerations if BG does not improve such as GLP-1, SGLT2 and  DPP4 agents. 5.  RTC to see me in 6 weeks.   Henrene Pastor, PharmD, CPP, CDE

## 2015-12-03 NOTE — Patient Instructions (Signed)
Possible medications for diabetes so consider in the future - Trulicity / Tanzem;  Jardiance / Invokana;  Januvia / Tradjenta   Diabetes and Standards of Medical Care   Diabetes is complicated. You may find that your diabetes team includes a dietitian, nurse, diabetes educator, eye doctor, and more. To help everyone know what is going on and to help you get the care you deserve, the following schedule of care was developed to help keep you on track. Below are the tests, exams, vaccines, medicines, education, and plans you will need.  Blood Glucose Goals Prior to meals = 80 - 130 Within 2 hours of the start of a meal = less than 180  HbA1c test (goal is less than 7.0% - your last value was %) This test shows how well you have controlled your glucose over the past 2 to 3 months. It is used to see if your diabetes management plan needs to be adjusted.   It is performed at least 2 times a year if you are meeting treatment goals.  It is performed 4 times a year if therapy has changed or if you are not meeting treatment goals.  Blood pressure test  This test is performed at every routine medical visit. The goal is less than 140/90 mmHg for most people, but 130/80 mmHg in some cases. Ask your health care provider about your goal.  Dental exam  Follow up with the dentist regularly.  Eye exam  If you are diagnosed with type 1 diabetes as a child, get an exam upon reaching the age of 10 years or older and have had diabetes for 3 to 5 years. Yearly eye exams are recommended after that initial eye exam.  If you are diagnosed with type 1 diabetes as an adult, get an exam within 5 years of diagnosis and then yearly.  If you are diagnosed with type 2 diabetes, get an exam as soon as possible after the diagnosis and then yearly.  Foot care exam  Visual foot exams are performed at every routine medical visit. The exams check for cuts, injuries, or other problems with the feet.  A comprehensive  foot exam should be done yearly. This includes visual inspection as well as assessing foot pulses and testing for loss of sensation.  Check your feet nightly for cuts, injuries, or other problems with your feet. Tell your health care provider if anything is not healing.  Kidney function test (urine microalbumin)  This test is performed once a year.  Type 1 diabetes: The first test is performed 5 years after diagnosis.  Type 2 diabetes: The first test is performed at the time of diagnosis.  A serum creatinine and estimated glomerular filtration rate (eGFR) test is done once a year to assess the level of chronic kidney disease (CKD), if present.  Lipid profile (cholesterol, HDL, LDL, triglycerides)  Performed every 5 years for most people.  The goal for LDL is less than 100 mg/dL. If you are at high risk, the goal is less than 70 mg/dL.  The goal for HDL is 40 mg/dL to 50 mg/dL for men and 50 mg/dL to 60 mg/dL for women. An HDL cholesterol of 60 mg/dL or higher gives some protection against heart disease.  The goal for triglycerides is less than 150 mg/dL.  Influenza vaccine, pneumococcal vaccine, and hepatitis B vaccine  The influenza vaccine is recommended yearly.  The pneumococcal vaccine is generally given once in a lifetime. However, there are some instances when  another vaccination is recommended. Check with your health care provider.  The hepatitis B vaccine is also recommended for adults with diabetes.  Diabetes self-management education  Education is recommended at diagnosis and ongoing as needed.  Treatment plan  Your treatment plan is reviewed at every medical visit.  Document Released: 07/24/2009 Document Revised: 05/29/2013 Document Reviewed: 02/26/2013 [facility] Patient Information 2014 [place].

## 2015-12-11 ENCOUNTER — Encounter: Payer: Self-pay | Admitting: Cardiology

## 2015-12-11 ENCOUNTER — Other Ambulatory Visit: Payer: Self-pay | Admitting: Cardiology

## 2015-12-11 ENCOUNTER — Ambulatory Visit (INDEPENDENT_AMBULATORY_CARE_PROVIDER_SITE_OTHER): Payer: BLUE CROSS/BLUE SHIELD | Admitting: Cardiology

## 2015-12-11 ENCOUNTER — Other Ambulatory Visit: Payer: Self-pay | Admitting: Family

## 2015-12-11 VITALS — BP 131/85 | HR 103 | Ht 71.0 in | Wt 364.0 lb

## 2015-12-11 DIAGNOSIS — I2510 Atherosclerotic heart disease of native coronary artery without angina pectoris: Secondary | ICD-10-CM | POA: Diagnosis not present

## 2015-12-11 DIAGNOSIS — I5023 Acute on chronic systolic (congestive) heart failure: Secondary | ICD-10-CM

## 2015-12-11 DIAGNOSIS — I10 Essential (primary) hypertension: Secondary | ICD-10-CM

## 2015-12-11 MED ORDER — FUROSEMIDE 80 MG PO TABS: 80.0000 mg | ORAL_TABLET | Freq: Two times a day (BID) | ORAL | Status: DC

## 2015-12-11 NOTE — Progress Notes (Signed)
Patient ID: Mark Leach, male   DOB: Nov 12, 1970, 45 y.o.   MRN: [id number]     Clinical Summary [name] is a 45 y.o.male  1. CAD/Chronic systolic HF/ICM - hx of prior anterior STEMI in April 2014 at [facility], received stent to LAD - admit Jan 2016 with MI, cath severe CAD as described below with LVEF by LV gram 25% referred for CABG - TEE LVEF 25-30% - s/p CABG Jan 2016 (LIMA-LAD, SVG-diag, SVG-PDA,SVG-OM) - seen by EP 02/2015 for ICD evaluation, patient requested to hold off at that time.   - last visit we increased his lasix to 62m bid due to weight gain . He was to call our office and update [place] his weights but never did. - not weighing himself at home.  -  weight down 4 lbs by our scales. Notes edema mildly improved. No SOB or DOE.   2. OSA  - compliant with CPAP Past Medical History  Diagnosis Date  . Coronary artery disease 01/2013    anterior MI with cath showing 95% pLAD, 90% diag, 30% OM1, 70% mRCA, 60% dRCA s/p PCI with DES to LAD and diag and EF 40%  . Ischemic dilated cardiomyopathy   . Hypertension   . Morbid obesity ([facility]   . Chronic systolic CHF (congestive heart failure) ([facility]   . Visual field defect 10/12/2014  . Diabetes mellitus type 2, uncontrolled, with complications ([facility]   . Stroke ([facility] 10/12/2014    Patient with several month h/o left sided visual field deficit and MRI of brain revealing:  1. Medial right occipital lobe subacute infarct. 2. At least 3 punctate areas of acute nonhemorrhagic infarct are present involving the left thalamus, high posterior right frontal lobe and high a medial posterior left frontal or parietal lobe. 3. Additional white matter changes are noted beyond the areas of  . 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 DJD (degenerative joint disease)     knees     Allergies  Allergen Reactions  . Nitroglycerin Other (See Comments)    Hypotension, syncope, bradycardia      Current Outpatient Prescriptions  Medication Sig Dispense Refill  . amLODipine (NORVASC) 5 MG tablet Take 1 tablet (5 mg total) by mouth daily. 90 tablet 3  . aspirin 81 MG tablet Take 81 mg by mouth daily.    .Marland Kitchenatorvastatin (LIPITOR) 80 MG tablet TAKE 1 TABLET DAILY AT 6 PM. 30 tablet 6  . citalopram (CELEXA) 40 MG tablet TAKE ONE TABLET BY MOUTH AT BEDTIME 30 tablet 1  . furosemide (LASIX) 20 MG tablet Take 3 tablets (60 mg total) by mouth 2 (two) times daily. 270 tablet 1  . glucose blood (BAYER CONTOUR NEXT TEST) test strip Use to check BG up to TID. 100 each 4  . Insulin Detemir (LEVEMIR) 100 UNIT/ML Pen Inject 50 Units into the skin daily at 10 pm. 15 mL 11  . Insulin Pen Needle 31G X 8 MM MISC Use every night at bedtime 100 each 11  . insulin starter kit- pen needles MISC 1 kit by Other route once. 1 kit 10  . Insulin Syringes, Disposable, U-100 1 ML MISC Use once daily 60 each 11  . losartan (COZAAR) 100 MG tablet Take 1 tablet (100 mg total) by mouth daily. 90 tablet 3  . magnesium oxide (MAG-OX) 400 MG tablet Take 1 tab 2  times daily for 5 days then 1 tab daily 40 tablet 3  . metFORMIN (GLUCOPHAGE) 1000 MG tablet Take 1 tablet (1,000 mg total) by mouth 2 (two) times daily with a meal. 180 tablet 1  . metoprolol (TOPROL-XL) 200 MG 24 hr tablet TAKE 1 TABLET ONCE DAILY. 30 tablet 3  . Misc Natural Products (BLACK CHERRY CONCENTRATE PO) Take 1 tablet by mouth daily as needed (gout flare-up).     Marland Kitchen spironolactone (ALDACTONE) 25 MG tablet Take 0.5 tablets (12.5 mg total) by mouth daily. 90 tablet 3  . Vitamin D, Ergocalciferol, (DRISDOL) 50000 UNITS CAPS capsule Take 1 capsule (50,000 Units total) by mouth every 7 (seven) days. 12 capsule 3   No current facility-administered medications for this visit.     Past Surgical History  Procedure Laterality Date  . Cardiac catheterization  01/2013    [facility] in [place]  . Coronary angioplasty  01/2013    PCI with stenting of LAD   . Cholecystectomy    . Left heart cath N/A 10/10/2014    Procedure: LEFT HEART CATH;  Surgeon: Mark Sine, MD;  Location: [facility] CATH LAB;  Service: Cardiovascular;  Laterality: N/A;  . Coronary artery bypass graft N/A 10/13/2014    Procedure: CORONARY ARTERY BYPASS GRAFTING (CABG), ON PUMP, TIMES FOUR, USING LEFT INTERNAL MAMMARY ARTERY, BILATERAL GREATER SAPHENOUS VEINS HARVESTED ENDOSCOPICALLY;  Surgeon: Mark Alberts, MD;  Location: [place];  Service: Open Heart Surgery;  Laterality: N/A;  . Multiple extractions with alveoloplasty N/A 10/22/2014    Procedure: Extraction of tooth #'s 1,3,4,5,6,7,8,9,10,11,12,13,14,15,16,20,21,22,24, 25,26,27,28,29,30,32 with alveoloplasty.;  Surgeon: Mark Leach, DDS;  Location: [place];  Service: Oral Surgery;  Laterality: N/A;     Allergies  Allergen Reactions  . Nitroglycerin Other (See Comments)    Hypotension, syncope, bradycardia      Family History  Problem Relation Age of Onset  . Heart attack Brother 39  . Diabetes Brother   . Arthritis Mother   . Asthma Mother   . Heart disease Maternal Uncle   . Heart disease Maternal Grandfather   . Alcohol abuse Maternal Grandfather      Social History Mark Leach that he quit smoking about 8 years ago. His smoking use included Cigarettes. He started smoking about 25 years ago. He has a 18 pack-year smoking history. He has never used smokeless tobacco. Mark Leach that he does not drink alcohol.   Review of Systems CONSTITUTIONAL: No weight loss, fever, chills, weakness or fatigue.  HEENT: Eyes: No visual loss, blurred vision, double vision or yellow sclerae.No hearing loss, sneezing, congestion, runny nose or sore throat.  SKIN: No rash or itching.  CARDIOVASCULAR: no chest pain, no palpitaitons RESPIRATORY: No shortness of breath, cough or sputum.  GASTROINTESTINAL: No anorexia, nausea, vomiting or diarrhea. No abdominal pain or blood.  GENITOURINARY: No burning on urination,  no polyuria NEUROLOGICAL: No headache, dizziness, syncope, paralysis, ataxia, numbness or tingling in the extremities. No change in bowel or bladder control.  MUSCULOSKELETAL: No muscle, back pain, joint pain or stiffness.  LYMPHATICS: No enlarged nodes. No history of splenectomy.  PSYCHIATRIC: No history of depression or anxiety.  ENDOCRINOLOGIC: No Leach of sweating, cold or heat intolerance. No polyuria or polydipsia.  Marland Kitchen   Physical Examination Filed Vitals:   12/11/15 1543  BP: 131/85  Pulse: 103   Filed Vitals:   12/11/15 1543  Height: _0  (1.803 m)  Weight: 364 lb (165.109 kg)    Gen: resting comfortably,  no acute distress HEENT: no scleral icterus, pupils equal round and reactive, no palptable cervical adenopathy,  CV: RRR, no m/r/g, no jvd Resp: Clear to auscultation bilaterally GI: abdomen is soft, non-tender, non-distended, normal bowel sounds, no hepatosplenomegaly MSK: extremities are warm, no edema.  Skin: warm, no rash Neuro:  no focal deficits Psych: appropriate affect   Diagnostic Studies /2016 Cath ANGIOGRAPHY:  Left main: Moderate size vessel which trifurcated into an LAD and intermediate vessel and left circumflex coronary artery.  LAD: The LAD was subtotally occluded at its ostium and there was diffuse 95-99% ostial proximal stenosis and then was totally occluded in the region of the first diagonal Mallorey Odonell. There was a gap and then faint filling of a second diagonal Joni Colegrove which had a stent and there was faint filling of the LAD beyond the diagonal vessel via collaterals.  Ramus Intermediate: Small caliber vessel free of significant disease.  Left circumflex: Large caliber vessel that gave rise to 2 major marginal branches and then in the posterior lateral like coronary artery. The first marginal Viraat Vanpatten was moderate size and had proximal 90% followed by 80% stenoses. A distal superior Teresa Lemmerman had 95% stenosis.  Right coronary artery: Moderate size  vessel that had 95% mid stenosis and 80% distal stenosis in the region of the acute margin. The vessel supplied the PDA. There were septal collaterals to the LAD.   Left ventriculography revealed dilated ventricle with an ejection fraction of 25% with diffuse global hypokinesis   IMPRESSION:  Severe ischemic dilated cardiomyopathy with an ejection fraction approximately 25%.  Severe multivessel coronary obstructive disease with diffusely diseased subtotal occlusion of the ostial and proximal LAD with faint collaterals to the first and second diagonal vessel and more distal LAD; left circumflex coronary artery with tandem 90 and 80% obtuse marginal 1 stenosis with distal 95% stenosis in this distal superior Tzion Wedel of this marginal vessel; and 95% mid RCA stenosis with 80% stenosis in the region of the acute margin with evidence for septal collaterals from the PDA to the LAD.  RECOMMENDATION:  The patient has surgical anatomy and CABG revascularization surgery will be recommended with surgical consultation in the morning. The patient was started on Aggrastat post procedure to reduce likelihood of progressive thrombosis. An attempt was made to initiate low-dose IV nitroglycerin, but the patient transiently dropped his blood pressure and this was discontinued.  10/2014 TEE Study Conclusions  - Left ventricle: Septal apical and anterior wall hypokinesis The cavity size was severely dilated. Systolic function was severely reduced. The estimated ejection fraction was in the range of 25% to 30%. - Aortic valve: There was trivial regurgitation. - Left atrium: The atrium was mildly dilated. - Atrial septum: No defect or patent foramen ovale was identified. - Pericardium, extracardiac: Small pericardial effusion IVC flat no evidence of tamponade.    02/2015 Echo Study Conclusions  - Left ventricle: The cavity size was mildly dilated. There was mild concentric hypertrophy. Systolic  function was moderately to severely reduced. The estimated ejection fraction was in the range of 30% to 35%. Contrast enhancement utilized. Doppler parameters are consistent with abnormal left ventricular relaxation (grade 1 diastolic dysfunction). Doppler parameters are consistent with high ventricular filling pressure. - Regional wall motion abnormality: Akinesis of the mid anterior, basal-mid anteroseptal, apical septal, and apical myocardium; severe hypokinesis of the mid inferoseptal myocardium; moderate hypokinesis of the apical lateral myocardium; mild hypokinesis of the mid anterolateral myocardium. - Aortic valve: Mildly to moderately calcified annulus. - Aorta: Mild to  moderate aortic root dilatation. Aortic root dimension: 44 mm (ED). - Mitral valve: Mildly calcified annulus. Mildly thickened leaflets . - Left atrium: The atrium was mildly dilated. Volume/bsa, ES, (1-plane Simpson&'s, A2C): 33.2 ml/m^2. - Right ventricle: Systolic function was mildly reduced.      Assessment and Plan   1. CAD/Acute on Chronic systolic HF/ICM - recent MI, found to have severe multivessel disease, s/p 4 vessel CABG Jan 2016. Echo 02/2015 LVEF 30-35% - he has elected to hold off on ICD for now, will continue to follow at this time - minimal weight gain since last visit, we will increase lasix to 72m bid and check BMET and Mg in 2 weeks. I have once again encouraged to check daily weights at home and to contact our office with updates.   F/u 3-4 weeks     JArnoldo Lenis M.D.

## 2015-12-11 NOTE — Patient Instructions (Addendum)
   Increase Lasix to 80mg  twice a day  - [facility]. Continue all other medications.   Lab for BMET, Magnesium - due in 2 weeks, around 12/25/2015 - orders given today.  Office will contact with results via phone or letter.   Follow up in  3-4 weeks

## 2015-12-14 ENCOUNTER — Other Ambulatory Visit: Payer: Self-pay | Admitting: Family

## 2015-12-14 ENCOUNTER — Other Ambulatory Visit: Payer: Self-pay

## 2015-12-14 ENCOUNTER — Other Ambulatory Visit: Payer: Self-pay | Admitting: *Deleted

## 2015-12-14 MED ORDER — METOPROLOL SUCCINATE ER 200 MG PO TB24: 200.0000 mg | ORAL_TABLET | Freq: Every day | ORAL | Status: DC

## 2015-12-31 ENCOUNTER — Ambulatory Visit: Payer: Self-pay | Admitting: Nutrition

## 2016-01-06 ENCOUNTER — Other Ambulatory Visit: Payer: Self-pay | Admitting: Family

## 2016-01-08 ENCOUNTER — Ambulatory Visit: Payer: Self-pay | Admitting: Cardiology

## 2016-01-11 ENCOUNTER — Ambulatory Visit: Payer: Self-pay | Admitting: Pharmacist

## 2016-01-22 ENCOUNTER — Encounter: Payer: Self-pay | Admitting: *Deleted

## 2016-02-09 ENCOUNTER — Telehealth: Payer: Self-pay | Admitting: *Deleted

## 2016-02-09 NOTE — Telephone Encounter (Signed)
-----   Message from Antoine PocheJonathan F Branch, MD sent at 02/09/2016 10:08 AM EDT ----- Labs look good. How have his weights been doing at home?  J branch MD

## 2016-02-09 NOTE — Telephone Encounter (Signed)
Pt aware and says he has not been checking weight, but says he thinks he is still retaining fluid.

## 2016-02-10 ENCOUNTER — Encounter: Payer: Self-pay | Admitting: Pharmacist

## 2016-02-10 ENCOUNTER — Encounter: Payer: Self-pay | Admitting: Cardiology

## 2016-02-10 ENCOUNTER — Ambulatory Visit (INDEPENDENT_AMBULATORY_CARE_PROVIDER_SITE_OTHER): Payer: BLUE CROSS/BLUE SHIELD | Admitting: Cardiology

## 2016-02-10 ENCOUNTER — Ambulatory Visit (INDEPENDENT_AMBULATORY_CARE_PROVIDER_SITE_OTHER): Payer: BLUE CROSS/BLUE SHIELD | Admitting: Pharmacist

## 2016-02-10 VITALS — BP 148/85 | HR 102 | Ht 71.0 in | Wt 363.0 lb

## 2016-02-10 VITALS — BP 124/68 | HR 67 | Ht 71.0 in | Wt 361.0 lb

## 2016-02-10 DIAGNOSIS — I10 Essential (primary) hypertension: Secondary | ICD-10-CM

## 2016-02-10 DIAGNOSIS — I2510 Atherosclerotic heart disease of native coronary artery without angina pectoris: Secondary | ICD-10-CM

## 2016-02-10 DIAGNOSIS — E118 Type 2 diabetes mellitus with unspecified complications: Secondary | ICD-10-CM

## 2016-02-10 DIAGNOSIS — IMO0002 Reserved for concepts with insufficient information to code with codable children: Secondary | ICD-10-CM

## 2016-02-10 DIAGNOSIS — E1165 Type 2 diabetes mellitus with hyperglycemia: Secondary | ICD-10-CM

## 2016-02-10 DIAGNOSIS — Z794 Long term (current) use of insulin: Secondary | ICD-10-CM | POA: Diagnosis not present

## 2016-02-10 DIAGNOSIS — I5043 Acute on chronic combined systolic (congestive) and diastolic (congestive) heart failure: Secondary | ICD-10-CM | POA: Diagnosis not present

## 2016-02-10 LAB — BAYER DCA HB A1C WAIVED: HB A1C (BAYER DCA - WAIVED): 10.5 % — ABNORMAL HIGH (ref <7.0)

## 2016-02-10 MED ORDER — GLUCOSE BLOOD VI STRP: ORAL_STRIP | Status: DC

## 2016-02-10 MED ORDER — LIRAGLUTIDE 18 MG/3ML ~~LOC~~ SOPN: PEN_INJECTOR | SUBCUTANEOUS | Status: DC

## 2016-02-10 MED ORDER — TORSEMIDE 20 MG PO TABS: 40.0000 mg | ORAL_TABLET | Freq: Two times a day (BID) | ORAL | Status: DC

## 2016-02-10 NOTE — Patient Instructions (Signed)
Diabetes and Standards of Medical Care   Diabetes is complicated. You may find that your diabetes team includes a dietitian, nurse, diabetes educator, eye doctor, and more. To help everyone know what is going on and to help you get the care you deserve, the following schedule of care was developed to help keep you on track. Below are the tests, exams, vaccines, medicines, education, and plans you will need.  Blood Glucose Goals Prior to meals = 80 - 130 Within 2 hours of the start of a meal = less than 180  HbA1c test (goal is less than 7.0% - your last value was 10.5%) This test shows how well you have controlled your glucose over the past 2 to 3 months. It is used to see if your diabetes management plan needs to be adjusted.   It is performed at least 2 times a year if you are meeting treatment goals.  It is performed 4 times a year if therapy has changed or if you are not meeting treatment goals.  Blood pressure test  This test is performed at every routine medical visit. The goal is less than 140/90 mmHg for most people, but 130/80 mmHg in some cases. Ask your health care provider about your goal.  Dental exam  Follow up with the dentist regularly.  Eye exam  If you are diagnosed with type 1 diabetes as a child, get an exam upon reaching the age of 10 years or older and have had diabetes for 3 to 5 years. Yearly eye exams are recommended after that initial eye exam.  If you are diagnosed with type 1 diabetes as an adult, get an exam within 5 years of diagnosis and then yearly.  If you are diagnosed with type 2 diabetes, get an exam as soon as possible after the diagnosis and then yearly.  Foot care exam  Visual foot exams are performed at every routine medical visit. The exams check for cuts, injuries, or other problems with the feet.  A comprehensive foot exam should be done yearly. This includes visual inspection as well as assessing foot pulses and testing for loss of  sensation.  Check your feet nightly for cuts, injuries, or other problems with your feet. Tell your health care provider if anything is not healing.  Kidney function test (urine microalbumin)  This test is performed once a year.  Type 1 diabetes: The first test is performed 5 years after diagnosis.  Type 2 diabetes: The first test is performed at the time of diagnosis.  A serum creatinine and estimated glomerular filtration rate (eGFR) test is done once a year to assess the level of chronic kidney disease (CKD), if present.  Lipid profile (cholesterol, HDL, LDL, triglycerides)  Performed every 5 years for most people.  The goal for LDL is less than 100 mg/dL. If you are at high risk, the goal is less than 70 mg/dL.  The goal for HDL is 40 mg/dL to 50 mg/dL for men and 50 mg/dL to 60 mg/dL for women. An HDL cholesterol of 60 mg/dL or higher gives some protection against heart disease.  The goal for triglycerides is less than 150 mg/dL.  Influenza vaccine, pneumococcal vaccine, and hepatitis B vaccine  The influenza vaccine is recommended yearly.  The pneumococcal vaccine is generally given once in a lifetime. However, there are some instances when another vaccination is recommended. Check with your health care provider.  The hepatitis B vaccine is also recommended for adults with diabetes.    Diabetes self-management education  Education is recommended at diagnosis and ongoing as needed.  Treatment plan  Your treatment plan is reviewed at every medical visit.  Document Released: 07/24/2009 Document Revised: 05/29/2013 Document Reviewed: 02/26/2013 [employer] Patient Information 2014 [employer]   

## 2016-02-10 NOTE — Progress Notes (Signed)
Patient ID: Mark Leach, male   DOB: [birth year], 45 y.o.   MRN: [id number]     Clinical Summary Mark Leach is a 45 y.o.male seen today for follow up of the following medical problems.   1. CAD/Chronic systolic HF/ICM - hx of prior anterior STEMI in April 2014 at [facility], received stent to LAD - admit Jan 2016 with MI, cath severe CAD as described below with LVEF by LV gram 25% referred for CABG - TEE LVEF 25-30% - s/p CABG Jan 2016 (LIMA-LAD, SVG-diag, SVG-PDA,SVG-OM) - seen by EP 02/2015 for ICD evaluation, patient requested to hold off at that time.  - last visit we increased lasix to  bid. No improvement in weight or swelling. He has not been checking his weights at home as we discussed last visit. No SOB or DOE    2. OSA  - compliant with CPAP Past Medical History  Diagnosis Date  . Coronary artery disease 01/2013    anterior MI with cath showing 95% pLAD, 90% diag, 30% OM1, 70% mRCA, 60% dRCA s/p PCI with DES to LAD and diag and EF 40%  . Ischemic dilated cardiomyopathy   . Hypertension   . Morbid obesity (HCC)   . Chronic systolic CHF (congestive heart failure) (HCC)   . Visual field defect 10/12/2014  . Diabetes mellitus type 2, uncontrolled, with complications (HCC)   . Stroke [facility]) 10/12/2014    Patient with several month h/o left sided visual field deficit and MRI of brain revealing:  1. Medial right occipital lobe subacute infarct. 2. At least 3 punctate areas of acute nonhemorrhagic infarct are present involving the left thalamus, high posterior right frontal lobe and high a medial posterior left frontal or parietal lobe. 3. Additional white matter changes are noted beyond the areas of  . 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 DJD (degenerative joint disease)     knees     Allergies  Allergen Reactions  . Nitroglycerin Other (See Comments)    Hypotension, syncope, bradycardia     Current Outpatient  Prescriptions  Medication Sig Dispense Refill  . amLODipine (NORVASC) 5 MG tablet Take 1 tablet (5 mg total) by mouth daily. 90 tablet 3  . aspirin 81 MG tablet Take 81 mg by mouth daily.    Marland Kitchen atorvastatin (LIPITOR) 80 MG tablet TAKE 1 TABLET DAILY AT 6 PM. 30 tablet 6  . citalopram (CELEXA) 40 MG tablet TAKE ONE TABLET BY MOUTH AT BEDTIME 30 tablet 1  . furosemide (LASIX) 80 MG tablet Take 1 tablet (80 mg total) by mouth 2 (two) times daily. 60 tablet 6  . glucose blood (BAYER CONTOUR NEXT TEST) test strip Use to check BG up to TID. 100 each 4  . Insulin Pen Needle (EASY TOUCH PEN NEEDLES) 31G X 8 MM MISC Use to administer insulin q hs. DX E11.9 50 each PRN  . Insulin Syringes, Disposable, U-100 1 ML MISC Use once daily 60 each 11  . LEVEMIR FLEXTOUCH 100 UNIT/ML Pen INJECT 50 UNITS SUBCUTANEOUSLY ONCE A DAY AT 10PM. 15 mL 0  . Liraglutide 18 MG/3ML SOPN Inject 0.6mg  SQ daily for 2 weeks, then increase to 1.2mg  daily 9 mL 1  . losartan (COZAAR) 100 MG tablet TAKE 1 TABLET ONCE DAILY. 30 tablet 4  . magnesium oxide (MAG-OX) 400 MG tablet Take 1 tab 2 times daily for 5 days  then 1 tab daily 40 tablet 3  . metFORMIN (GLUCOPHAGE) 1000 MG tablet Take 1 tablet (1,000 mg total) by mouth 2 (two) times daily with a meal. 180 tablet 1  . metoprolol (TOPROL-XL) 200 MG 24 hr tablet TAKE 1 TABLET ONCE DAILY. 30 tablet 6  . Misc Natural Products (BLACK CHERRY CONCENTRATE PO) Take 1 tablet by mouth daily as needed (gout flare-up).     Marland Kitchen spironolactone (ALDACTONE) 25 MG tablet Take 0.5 tablets (12.5 mg total) by mouth daily. 90 tablet 3   No current facility-administered medications for this visit.     Past Surgical History  Procedure Laterality Date  . Cardiac catheterization  01/2013    [facility] in [place]  . Coronary angioplasty  01/2013    PCI with stenting of LAD  . Cholecystectomy    . Left heart cath N/A 10/10/2014    Procedure: LEFT HEART CATH;  Surgeon: Mark Bihari, MD;  Location: [facility] CATH  LAB;  Service: Cardiovascular;  Laterality: N/A;  . Coronary artery bypass graft N/A 10/13/2014    Procedure: CORONARY ARTERY BYPASS GRAFTING (CABG), ON PUMP, TIMES FOUR, USING LEFT INTERNAL MAMMARY ARTERY, BILATERAL GREATER SAPHENOUS VEINS HARVESTED ENDOSCOPICALLY;  Surgeon: Mark Nails, MD;  Location: [facility] OR;  Service: Open Heart Surgery;  Laterality: N/A;  . Multiple extractions with alveoloplasty N/A 10/22/2014    Procedure: Extraction of tooth #'s 1,3,4,5,6,7,8,9,10,11,12,13,14,15,16,20,21,22,24, 25,26,27,28,29,30,32 with alveoloplasty.;  Surgeon: Mark Leach, DDS;  Location: [facility] OR;  Service: Oral Surgery;  Laterality: N/A;     Allergies  Allergen Reactions  . Nitroglycerin Other (See Comments)    Hypotension, syncope, bradycardia      Family History  Problem Relation Age of Onset  . Heart attack Brother 37  . Diabetes Brother   . Arthritis Mother   . Asthma Mother   . Heart disease Maternal Uncle   . Heart disease Maternal Grandfather   . Alcohol abuse Maternal Grandfather      Social History [name] reports that he quit smoking about 8 years ago. His smoking use included Cigarettes. He started smoking about 26 years ago. He has a 18 pack-year smoking history. He has never used smokeless tobacco. [name] reports that he does not drink alcohol.   Review of Systems CONSTITUTIONAL: No weight loss, fever, chills, weakness or fatigue.  HEENT: Eyes: No visual loss, blurred vision, double vision or yellow sclerae.No hearing loss, sneezing, congestion, runny nose or sore throat.  SKIN: No rash or itching.  CARDIOVASCULAR: per HPI RESPIRATORY: No shortness of breath, cough or sputum.  GASTROINTESTINAL: No anorexia, nausea, vomiting or diarrhea. No abdominal pain or blood.  GENITOURINARY: No burning on urination, no polyuria NEUROLOGICAL: No headache, dizziness, syncope, paralysis, ataxia, numbness or tingling in the extremities. No change in bowel or bladder control.   MUSCULOSKELETAL: No muscle, back pain, joint pain or stiffness.  LYMPHATICS: No enlarged nodes. No history of splenectomy.  PSYCHIATRIC: No history of depression or anxiety.  ENDOCRINOLOGIC: No reports of sweating, cold or heat intolerance. No polyuria or polydipsia.  Marland Kitchen   Physical Examination Filed Vitals:   02/10/16 1443  BP: 148/85  Pulse: 102   Filed Vitals:   02/10/16 1443  Height: 5\' 11"  (1.803 m)  Weight: 363 lb (164.656 kg)    Gen: resting comfortably, no acute distress HEENT: no scleral icterus, pupils equal round and reactive, no palptable cervical adenopathy,  CV: RRR, no m/r/g, no jvd Resp: Clear to auscultation bilaterally GI: abdomen is soft, non-tender,  non-distended, normal bowel sounds, no hepatosplenomegaly MSK: extremities are warm, 2+ bilateral edema Skin: warm, no rash Neuro:  no focal deficits Psych: appropriate affect   Diagnostic Studies  /2016 Cath ANGIOGRAPHY:  Left main: Moderate size vessel which trifurcated into an LAD and intermediate vessel and left circumflex coronary artery.  LAD: The LAD was subtotally occluded at its ostium and there was diffuse 95-99% ostial proximal stenosis and then was totally occluded in the region of the first diagonal Shawnika Pepin. There was a gap and then faint filling of a second diagonal Rashon Westrup which had a stent and there was faint filling of the LAD beyond the diagonal vessel via collaterals.  Ramus Intermediate: Small caliber vessel free of significant disease.  Left circumflex: Large caliber vessel that gave rise to 2 major marginal branches and then in the posterior lateral like coronary artery. The first marginal Thoma Paulsen was moderate size and had proximal 90% followed by 80% stenoses. A distal superior Nameer Summer had 95% stenosis.  Right coronary artery: Moderate size vessel that had 95% mid stenosis and 80% distal stenosis in the region of the acute margin. The vessel supplied the PDA. There were septal  collaterals to the LAD.   Left ventriculography revealed dilated ventricle with an ejection fraction of 25% with diffuse global hypokinesis   IMPRESSION:  Severe ischemic dilated cardiomyopathy with an ejection fraction approximately 25%.  Severe multivessel coronary obstructive disease with diffusely diseased subtotal occlusion of the ostial and proximal LAD with faint collaterals to the first and second diagonal vessel and more distal LAD; left circumflex coronary artery with tandem 90 and 80% obtuse marginal 1 stenosis with distal 95% stenosis in this distal superior Layton Naves of this marginal vessel; and 95% mid RCA stenosis with 80% stenosis in the region of the acute margin with evidence for septal collaterals from the PDA to the LAD.  RECOMMENDATION:  The patient has surgical anatomy and CABG revascularization surgery will be recommended with surgical consultation in the morning. The patient was started on Aggrastat post procedure to reduce likelihood of progressive thrombosis. An attempt was made to initiate low-dose IV nitroglycerin, but the patient transiently dropped his blood pressure and this was discontinued.  10/2014 TEE Study Conclusions  - Left ventricle: Septal apical and anterior wall hypokinesis The cavity size was severely dilated. Systolic function was severely reduced. The estimated ejection fraction was in the range of 25% to 30%. - Aortic valve: There was trivial regurgitation. - Left atrium: The atrium was mildly dilated. - Atrial septum: No defect or patent foramen ovale was identified. - Pericardium, extracardiac: Small pericardial effusion IVC flat no evidence of tamponade.    02/2015 Echo Study Conclusions  - Left ventricle: The cavity size was mildly dilated. There was mild concentric hypertrophy. Systolic function was moderately to severely reduced. The estimated ejection fraction was in the range of 30% to 35%. Contrast enhancement  utilized. Doppler parameters are consistent with abnormal left ventricular relaxation (grade 1 diastolic dysfunction). Doppler parameters are consistent with high ventricular filling pressure. - Regional wall motion abnormality: Akinesis of the mid anterior, basal-mid anteroseptal, apical septal, and apical myocardium; severe hypokinesis of the mid inferoseptal myocardium; moderate hypokinesis of the apical lateral myocardium; mild hypokinesis of the mid anterolateral myocardium. - Aortic valve: Mildly to moderately calcified annulus. - Aorta: Mild to moderate aortic root dilatation. Aortic root dimension: 44 mm (ED). - Mitral valve: Mildly calcified annulus. Mildly thickened leaflets . - Left atrium: The atrium was mildly dilated. Volume/bsa, ES, (1-plane Simpson&'s,  A2C): 33.2 ml/m^2. - Right ventricle: Systolic function was mildly reduced.  5/3/1/7 Clinic EKG (performed and reviewed in clinic) Sinus tach  Assessment and Plan   1. CAD/Acute on Chronic systolic HF/ICM - previous MI, found to have severe multivessel disease, s/p 4 vessel CABG Jan 2016. Echo 02/2015 LVEF 30-35% - he has elected to hold off on ICD for now, will continue to follow at this time - I have not been able to get him to commit to daily weights and report his weights to [place]. He has not wanted a home health CHF nurse. We will stop lasix and start torsemide 40mg  bid. Recheck BMET and Mg in 2 weeks - we discussed entresto, he wishes to do some reading about it before deciding to change  2. HTN - mildly elevated in clinic, follow with increased diuresis   F/u 1 month     Antoine Poche, M.D.

## 2016-02-10 NOTE — Progress Notes (Signed)
Subjective:    Mark Leach is a 45 y.o. male who presents for an initial evaluation of Type 2 diabetes mellitus.  Current symptoms/problems include hyperglycemia and polyuria   The patient was initially diagnosed with Type 2 diabetes mellitus about 2 years ago.   Known diabetic complications: cardiovascular disease and cerebrovascular disease Cardiovascular risk factors: diabetes mellitus, dyslipidemia, hypertension, male gender, obesity (BMI >= 30 kg/m2) and sedentary lifestyle Current diabetic medications include metformin 1000mg  bid (only misses about once per week) Levemir 50 units once daily.   Eye exam current (within one year): yes Weight trend: decreasing steadily - has lost about 9# over last 2 months (but had gained weight over the last year - about 20#) Prior visit with dietician: yes  Current diet:  patient is trying to limit portion sizes.  He is avoiding high fat meats and fried foods.  Eating much less pasta over the last 2 months.  mostly drinks water but occasionally has a MtDew energy drink with has 15 grams of CHO Current exercise: none  Current monitoring regimen: checking about every other day but he ran out of strip and lost Rx given at last appt so he was out of strips for about 2 weeks. Home blood sugar records: 216, 183, 211, 213, 199, 243, 276, 281, 197, 246 Any episodes of hypoglycemia? no  Is He on ACE inhibitor or angiotensin II receptor blocker?  Yes    losartan (Cozaar)  The following portions of the patient's history were reviewed and updated as appropriate: allergies, current medications, past family history, past medical history, past social history, past surgical history and problem list.   Objective:    There were no vitals taken for this visit.   Date A1c  02/10/2016 10.5%  11/10/2015 9.3%  08/07/2015 8.0%  04/16/2015 6.8%  11/20/2014 6.5%      Lab Review GLUCOSE (mg/dL)  Date Value  04/54/098102/14/2017 233*  11/10/2015 189*  08/07/2015  197*   GLUCOSE, BLD (mg/dL)  Date Value  19/14/782901/17/2016 192*  10/23/2014 132*  10/22/2014 129*   CO2 (mmol/L)  Date Value  11/24/2015 26  11/10/2015 25  08/07/2015 27   BUN (mg/dL)  Date Value  56/21/308602/14/2017 24  11/10/2015 16  08/07/2015 16  10/26/2014 22  10/23/2014 18  10/22/2014 30*   CREATININE, SER (mg/dL)  Date Value  57/84/696202/14/2017 1.13  11/10/2015 0.98  08/07/2015 1.01    Assessment:    Diabetes Mellitus type II, under inadequate control.   Medication management / non compliance Obesity - weight continues to decrease  Plan:    1.  Rx changes:  Continue metformin1000mg  bid and discussed importance of taking regularly  Add Victoza QD - start with 0.6mg  for 2 weeks then increase to 1.2mg  QD.    Continue Levemir 50 units once daily  Rx for test strip sent to his pharmacy 2.  Reviewed HBG goals.  Patient is again instructed to call office if BG is greater than 200 twice within 1 week for medication adjustment.   3.  Reviewed CHO counting with patient.  Discussed 15 gram CHO serving sizes and handout given.   4.  Increase physical activity as able - try to walk 10 minutes daily.  5.  RTC to see me in 8 weeks. Appt with PCP in 2 weeks  Henrene Pastorammy Kenedee Molesky, PharmD, CPP, CDE

## 2016-02-10 NOTE — Patient Instructions (Addendum)
   Stop Lasix.  Begin Torsemide 40mg  twice a day  - [facility]. Continue all other medications.   Labs for BMET, Magnesium - due in 2 weeks - orders given today. Office will contact with results via phone or letter.   Follow up in  1 month.

## 2016-02-12 ENCOUNTER — Encounter: Payer: Self-pay | Admitting: Internal Medicine

## 2016-02-22 ENCOUNTER — Ambulatory Visit (INDEPENDENT_AMBULATORY_CARE_PROVIDER_SITE_OTHER): Payer: BLUE CROSS/BLUE SHIELD | Admitting: Family

## 2016-02-22 ENCOUNTER — Encounter: Payer: Self-pay | Admitting: Family

## 2016-02-22 VITALS — BP 166/108 | HR 106 | Temp 97.5°F | Ht 71.0 in | Wt 354.2 lb

## 2016-02-22 DIAGNOSIS — I10 Essential (primary) hypertension: Secondary | ICD-10-CM | POA: Diagnosis not present

## 2016-02-22 DIAGNOSIS — E118 Type 2 diabetes mellitus with unspecified complications: Secondary | ICD-10-CM | POA: Diagnosis not present

## 2016-02-22 DIAGNOSIS — G4733 Obstructive sleep apnea (adult) (pediatric): Secondary | ICD-10-CM | POA: Diagnosis not present

## 2016-02-22 DIAGNOSIS — Z951 Presence of aortocoronary bypass graft: Secondary | ICD-10-CM

## 2016-02-22 DIAGNOSIS — I2510 Atherosclerotic heart disease of native coronary artery without angina pectoris: Secondary | ICD-10-CM | POA: Diagnosis not present

## 2016-02-22 DIAGNOSIS — E559 Vitamin D deficiency, unspecified: Secondary | ICD-10-CM

## 2016-02-22 DIAGNOSIS — E1165 Type 2 diabetes mellitus with hyperglycemia: Secondary | ICD-10-CM | POA: Diagnosis not present

## 2016-02-22 DIAGNOSIS — I5043 Acute on chronic combined systolic (congestive) and diastolic (congestive) heart failure: Secondary | ICD-10-CM | POA: Diagnosis not present

## 2016-02-22 DIAGNOSIS — Z23 Encounter for immunization: Secondary | ICD-10-CM

## 2016-02-22 DIAGNOSIS — F4323 Adjustment disorder with mixed anxiety and depressed mood: Secondary | ICD-10-CM

## 2016-02-22 DIAGNOSIS — IMO0002 Reserved for concepts with insufficient information to code with codable children: Secondary | ICD-10-CM

## 2016-02-22 DIAGNOSIS — B078 Other viral warts: Secondary | ICD-10-CM

## 2016-02-22 DIAGNOSIS — E785 Hyperlipidemia, unspecified: Secondary | ICD-10-CM

## 2016-02-22 DIAGNOSIS — I2102 ST elevation (STEMI) myocardial infarction involving left anterior descending coronary artery: Secondary | ICD-10-CM

## 2016-02-22 DIAGNOSIS — Z794 Long term (current) use of insulin: Secondary | ICD-10-CM

## 2016-02-22 LAB — BAYER DCA HB A1C WAIVED: HB A1C: 10.5 % — AB (ref <7.0)

## 2016-02-22 NOTE — Addendum Note (Signed)
Addended by: Jannifer RodneyHAWKS, CHRISTY A on: 02/22/2016 11:44 AM   Modules accepted: Kipp BroodSmartSet

## 2016-02-22 NOTE — Addendum Note (Signed)
Addended by: Almeta MonasSTONE, Nathian Stencil M on: 02/22/2016 11:44 AM   Modules accepted: Orders, SmartSet

## 2016-02-22 NOTE — Progress Notes (Addendum)
Subjective:    Patient ID: Mark Leach, male    DOB: [birth year], 45 y.o.   MRN: [id number]   Pt presents to the office today for chronic follow up. Pt is followed by his Cardiologists every month trying to reduce his edema. Pt states he is suppose to weight himself daily, but he is noncompliant.  PT also has multiple warts on his right hand that he wants to be removed.   Hypertension This is a chronic problem. The current episode started more than 1 year ago. The problem has been waxing and waning since onset. The problem is uncontrolled. Associated symptoms include anxiety, peripheral edema ("just a little") and shortness of breath. Pertinent negatives include no blurred vision, headaches or palpitations. Risk factors for coronary artery disease include diabetes mellitus, dyslipidemia, male gender and obesity. Past treatments include ACE inhibitors, beta blockers and diuretics. The current treatment provides mild improvement. Hypertensive end-organ damage includes CAD/MI, CVA and heart failure. There is no history of kidney disease or a thyroid problem. Identifiable causes of hypertension include sleep apnea.  Diabetes He presents for his follow-up diabetic visit. He has type 2 diabetes mellitus. His disease course has been worsening. Pertinent negatives for hypoglycemia include no confusion, headaches or mood changes. Pertinent negatives for diabetes include no blurred vision, no foot paresthesias, no foot ulcerations and no visual change. Pertinent negatives for hypoglycemia complications include no blackouts and no hospitalization. Symptoms are worsening. Diabetic complications include a CVA and heart disease. Pertinent negatives for diabetic complications include no nephropathy or peripheral neuropathy. Risk factors for coronary artery disease include diabetes mellitus, dyslipidemia, family history, hypertension, male sex, obesity and sedentary lifestyle. Current diabetic treatment includes insulin  injections and oral agent (monotherapy). He is compliant with treatment all of the time. His weight is stable. He is following a generally unhealthy diet. His breakfast blood glucose range is generally >200 mg/dl. ( ) An ACE inhibitor/angiotensin II receptor blocker is being taken. Eye exam is current.  Hyperlipidemia This is a chronic problem. The current episode started more than 1 year ago. The problem is controlled. Recent lipid tests were reviewed and are normal. Exacerbating diseases include diabetes and obesity. He has no history of hypothyroidism. Associated symptoms include shortness of breath. Pertinent negatives include no leg pain or myalgias. Current antihyperlipidemic treatment includes statins. The current treatment provides significant improvement of lipids. Risk factors for coronary artery disease include diabetes mellitus, dyslipidemia, family history, hypertension, male sex, obesity and a sedentary lifestyle.  Anxiety Presents for follow-up visit. Symptoms include depressed mood, excessive worry and shortness of breath. Patient reports no confusion, dry mouth, insomnia, irritability, palpitations or panic. Symptoms occur occasionally. The severity of symptoms is moderate. The symptoms are aggravated by family issues. The quality of sleep is good.   His past medical history is significant for anxiety/panic attacks and depression. There is no history of CAD. Past treatments include SSRIs. Compliance with prior treatments has been good.  Arthritis Presents for follow-up visit. The disease course has been fluctuating. He complains of pain and joint swelling. Affected locations include the right shoulder, left elbow, left knee and right knee. His pain is at a severity of 3/10. Pertinent negatives include no dry eyes, dry mouth, dysuria or pain while resting. His past medical history is significant for osteoarthritis.      Review of Systems  Constitutional: Negative.  Negative for  irritability.  HENT: Negative.   Eyes: Negative for blurred vision.  Respiratory: Positive for shortness of  breath.   Cardiovascular: Negative.  Negative for palpitations.  Gastrointestinal: Negative.   Endocrine: Negative.   Genitourinary: Negative.  Negative for dysuria.  Musculoskeletal: Positive for joint swelling and arthritis. Negative for myalgias.  Neurological: Negative.  Negative for headaches.  Hematological: Negative.   Psychiatric/Behavioral: Negative.  Negative for confusion. The patient does not have insomnia.   All other systems reviewed and are negative.      Objective:   Physical Exam  Constitutional: He is oriented to person, place, and time. He appears well-developed and well-nourished. No distress.  Pt morbid obese   HENT:  Head: Normocephalic.  Right Ear: External ear normal.  Left Ear: External ear normal.  Mouth/Throat: Oropharynx is clear and moist.  Eyes: Pupils are equal, round, and reactive to light. Right eye exhibits no discharge. Left eye exhibits no discharge.  Neck: Normal range of motion. Neck supple. No thyromegaly present.  Cardiovascular: Normal rate, regular rhythm, normal heart sounds and intact distal pulses.   No murmur heard. Pulmonary/Chest: Effort normal and breath sounds normal. No respiratory distress. He has no wheezes.  Abdominal: Soft. Bowel sounds are normal. He exhibits no distension. There is no tenderness.  Musculoskeletal: Normal range of motion. He exhibits edema (3+ in BLE  ). He exhibits no tenderness.  Neurological: He is alert and oriented to person, place, and time. He has normal reflexes. No cranial nerve deficit.  Skin: Skin is warm and dry. No rash noted. No erythema.  Multiple wart on right hand, (one on thumb, 3 on index, 2 on middle finger, one on ring finger)  Psychiatric: He has a normal mood and affect. His behavior is normal. Judgment and thought content normal.  Vitals reviewed.  Cryotherapy used on wart on  right thumb, 2 on index, and one on middle finger   BP 166/108 mmHg  Pulse 106  Temp(Src) 97.5 F (36.4 C) (Oral)  Ht '5\' 11"'$  (1.803 m)  Wt 354 lb 3.2 oz (160.664 kg)  BMI 49.42 kg/m2     Assessment & Plan:  1. Acute on chronic combined systolic and diastolic heart failure (HCC) - CMP14+EGFR  2. Coronary artery disease involving native coronary artery of native heart without angina pectoris - CMP14+EGFR  3. Essential hypertension - CMP14+EGFR  4. ST elevation myocardial infarction (STEMI) involving left anterior descending (LAD) coronary artery with complication (HCC) - MMN81+RRNH  5. Obstructive sleep apnea - CMP14+EGFR  6. Uncontrolled type 2 diabetes mellitus with complication, with long-term current use of insulin (HCC) - Bayer DCA Hb A1c Waived - CMP14+EGFR  7. Adjustment disorder with mixed anxiety and depressed mood - CMP14+EGFR  8. Hyperlipidemia - CMP14+EGFR - Lipid panel  9. Morbid obesity, unspecified obesity type ([place]) - CMP14+EGFR  10. Vitamin D deficiency - CMP14+EGFR - VITAMIN D 25 Hydroxy (Vit-D Deficiency, Fractures)  11. S/P CABG x 4 - CMP14+EGFR   Continue all meds Labs pending Health Maintenance reviewed-TDAP given today Diet and exercise encouraged RTO 3 months, keep all appts with Cardiologists for CHF and HTN   Evelina Dun, FNP

## 2016-02-22 NOTE — Patient Instructions (Signed)

## 2016-02-23 ENCOUNTER — Telehealth: Payer: Self-pay | Admitting: Family

## 2016-02-23 ENCOUNTER — Other Ambulatory Visit: Payer: Self-pay | Admitting: Family

## 2016-02-23 DIAGNOSIS — E875 Hyperkalemia: Secondary | ICD-10-CM

## 2016-02-23 LAB — CMP14+EGFR
A/G RATIO: 1.5 (ref 1.2–2.2)
ALT: 38 IU/L (ref 0–44)
AST: 17 IU/L (ref 0–40)
Albumin: 4.6 g/dL (ref 3.5–5.5)
Alkaline Phosphatase: 209 IU/L — ABNORMAL HIGH (ref 39–117)
BUN/Creatinine Ratio: 20 (ref 9–20)
BUN: 22 mg/dL (ref 6–24)
Bilirubin Total: 0.7 mg/dL (ref 0.0–1.2)
CALCIUM: 10.4 mg/dL — AB (ref 8.7–10.2)
CO2: 27 mmol/L (ref 18–29)
CREATININE: 1.11 mg/dL (ref 0.76–1.27)
Chloride: 94 mmol/L — ABNORMAL LOW (ref 96–106)
GFR, EST AFRICAN AMERICAN: 93 mL/min/{1.73_m2} (ref >59)
GFR, EST NON AFRICAN AMERICAN: 80 mL/min/{1.73_m2} (ref >59)
GLUCOSE: 217 mg/dL — AB (ref 65–99)
Globulin, Total: 3.1 g/dL (ref 1.5–4.5)
Potassium: 5.3 mmol/L — ABNORMAL HIGH (ref 3.5–5.2)
Sodium: 142 mmol/L (ref 134–144)
TOTAL PROTEIN: 7.7 g/dL (ref 6.0–8.5)

## 2016-02-23 LAB — LIPID PANEL
CHOL/HDL RATIO: 4.7 ratio (ref 0.0–5.0)
Cholesterol, Total: 150 mg/dL (ref 100–199)
HDL: 32 mg/dL — AB (ref >39)
LDL CALC: 85 mg/dL (ref 0–99)
TRIGLYCERIDES: 163 mg/dL — AB (ref 0–149)
VLDL CHOLESTEROL CAL: 33 mg/dL (ref 5–40)

## 2016-02-23 LAB — VITAMIN D 25 HYDROXY (VIT D DEFICIENCY, FRACTURES): VIT D 25 HYDROXY: 31.3 ng/mL (ref 30.0–100.0)

## 2016-02-23 NOTE — Telephone Encounter (Signed)
Patient aware of results.

## 2016-02-25 ENCOUNTER — Other Ambulatory Visit: Payer: Self-pay | Admitting: Cardiology

## 2016-03-02 ENCOUNTER — Other Ambulatory Visit: Payer: Self-pay | Admitting: Family

## 2016-03-18 ENCOUNTER — Telehealth: Payer: Self-pay | Admitting: *Deleted

## 2016-03-18 NOTE — Telephone Encounter (Signed)
-----   Message from Antoine PocheJonathan F Branch, MD sent at 03/11/2016 11:57 AM EDT ----- Labs look good. Please contact him and see how his weights and swelling are doing  Dominga FerryJ Branch MD

## 2016-03-18 NOTE — Telephone Encounter (Signed)
Pt aware and says swelling and weight have gone down in the last 2 weeks. Has not weighed this week since he hasn't noticed any swelling. appt on 6/12

## 2016-03-21 ENCOUNTER — Ambulatory Visit (INDEPENDENT_AMBULATORY_CARE_PROVIDER_SITE_OTHER): Payer: BLUE CROSS/BLUE SHIELD | Admitting: Cardiology

## 2016-03-21 ENCOUNTER — Encounter: Payer: Self-pay | Admitting: Cardiology

## 2016-03-21 VITALS — BP 112/74 | HR 86 | Ht 71.0 in | Wt 354.2 lb

## 2016-03-21 DIAGNOSIS — I2510 Atherosclerotic heart disease of native coronary artery without angina pectoris: Secondary | ICD-10-CM | POA: Diagnosis not present

## 2016-03-21 DIAGNOSIS — I10 Essential (primary) hypertension: Secondary | ICD-10-CM | POA: Diagnosis not present

## 2016-03-21 DIAGNOSIS — I5023 Acute on chronic systolic (congestive) heart failure: Secondary | ICD-10-CM | POA: Diagnosis not present

## 2016-03-21 MED ORDER — SACUBITRIL-VALSARTAN 49-51 MG PO TABS
1.0000 | ORAL_TABLET | Freq: Two times a day (BID) | ORAL | Status: DC
Start: 2016-03-21 — End: 2016-03-23

## 2016-03-21 NOTE — Progress Notes (Signed)
Patient ID: Mark Leach, male   DOB: July 07, 1971, 45 y.o.   MRN: [id number]     Clinical Summary Mr. Manke is a 45 y.o.male seen today for follow up of the following medical problems.   1. CAD/Chronic systolic HF/ICM - hx of prior anterior STEMI in April 2014 at [facility], received stent to LAD - admit Jan 2016 with MI, cath severe CAD as described below with LVEF by LV gram 25% referred for CABG - s/p CABG Jan 2016 (LIMA-LAD, SVG-diag, SVG-PDA,SVG-OM) - 02/2015 echo: LVEF 30-35% - seen by EP 02/2015 for ICD evaluation, patient requested to hold off at that time.   - last visit we switched him to torsemide  bid due to ongoing weight gain and edema. Since change weight is down 10 lbs and swelling is improving. Repeat labs are stable.     Past Medical History  Diagnosis Date  . Coronary artery disease 01/2013    anterior MI with cath showing 95% pLAD, 90% diag, 30% OM1, 70% mRCA, 60% dRCA s/p PCI with DES to LAD and diag and EF 40%  . Ischemic dilated cardiomyopathy   . Hypertension   . Morbid obesity (HCC)   . Chronic systolic CHF (congestive heart failure) (HCC)   . Visual field defect 10/12/2014  . Diabetes mellitus type 2, uncontrolled, with complications (HCC)   . Stroke [facility]) 10/12/2014    Patient with several month h/o left sided visual field deficit and MRI of brain revealing:  1. Medial right occipital lobe subacute infarct. 2. At least 3 punctate areas of acute nonhemorrhagic infarct are present involving the left thalamus, high posterior right frontal lobe and high a medial posterior left frontal or parietal lobe. 3. Additional white matter changes are noted beyond the areas of  . 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 DJD (degenerative joint disease)     knees     Allergies  Allergen Reactions  . Nitroglycerin Other (See Comments)    Hypotension, syncope, bradycardia     Current Outpatient Prescriptions    Medication Sig Dispense Refill  . amLODipine (NORVASC) 5 MG tablet Take 1 tablet (5 mg total) by mouth daily. 90 tablet 3  . aspirin 81 MG tablet Take 81 mg by mouth daily.    Marland Kitchen atorvastatin (LIPITOR) 80 MG tablet TAKE 1 TABLET DAILY AT 6 PM. 30 tablet 6  . citalopram (CELEXA) 40 MG tablet TAKE ONE TABLET BY MOUTH AT BEDTIME 30 tablet 1  . glucose blood (BAYER CONTOUR NEXT TEST) test strip Use to check BG up to TID. 100 each 4  . Insulin Pen Needle (EASY TOUCH PEN NEEDLES) 31G X 8 MM MISC Use to administer insulin q hs. DX E11.9 50 each PRN  . Insulin Syringes, Disposable, U-100 1 ML MISC Use once daily 60 each 11  . LEVEMIR FLEXTOUCH 100 UNIT/ML Pen INJECT 50 UNITS SUBCUTANEOUSLY ONCE A DAY AT 10PM. 15 mL 1  . Liraglutide 18 MG/3ML SOPN Inject 0.6mg  SQ daily for 2 weeks, then increase to 1.2mg  daily 9 mL 1  . losartan (COZAAR) 100 MG tablet TAKE 1 TABLET ONCE DAILY. 30 tablet 4  . magnesium oxide (MAG-OX) 400 MG tablet Take 1 tab 2 times daily for 5 days then 1 tab daily 40 tablet 3  . Magnesium Oxide 400 (240 Mg) MG TABS Take 1 tab daily 30 tablet 0  . metFORMIN (  GLUCOPHAGE) 1000 MG tablet Take 1 tablet (1,000 mg total) by mouth 2 (two) times daily with a meal. 180 tablet 1  . metoprolol (TOPROL-XL) 200 MG 24 hr tablet TAKE 1 TABLET ONCE DAILY. 30 tablet 6  . Misc Natural Products (BLACK CHERRY CONCENTRATE PO) Take 1 tablet by mouth daily as needed (gout flare-up).     Marland Kitchen spironolactone (ALDACTONE) 25 MG tablet Take 0.5 tablets (12.5 mg total) by mouth daily. 90 tablet 3  . torsemide (DEMADEX) 20 MG tablet Take 2 tablets (40 mg total) by mouth 2 (two) times daily. 120 tablet 6   No current facility-administered medications for this visit.     Past Surgical History  Procedure Laterality Date  . Cardiac catheterization  01/2013    [facility] in [place]  . Coronary angioplasty  01/2013    PCI with stenting of LAD  . Cholecystectomy    . Left heart cath N/A 10/10/2014    Procedure: LEFT  HEART CATH;  Surgeon: Lennette Bihari, MD;  Location: [facility] CATH LAB;  Service: Cardiovascular;  Laterality: N/A;  . Coronary artery bypass graft N/A 10/13/2014    Procedure: CORONARY ARTERY BYPASS GRAFTING (CABG), ON PUMP, TIMES FOUR, USING LEFT INTERNAL MAMMARY ARTERY, BILATERAL GREATER SAPHENOUS VEINS HARVESTED ENDOSCOPICALLY;  Surgeon: Purcell Nails, MD;  Location: [facility] OR;  Service: Open Heart Surgery;  Laterality: N/A;  . Multiple extractions with alveoloplasty N/A 10/22/2014    Procedure: Extraction of tooth #'s 1,3,4,5,6,7,8,9,10,11,12,13,14,15,16,20,21,22,24, 25,26,27,28,29,30,32 with alveoloplasty.;  Surgeon: Charlynne Pander, DDS;  Location: [facility] OR;  Service: Oral Surgery;  Laterality: N/A;     Allergies  Allergen Reactions  . Nitroglycerin Other (See Comments)    Hypotension, syncope, bradycardia      Family History  Problem Relation Age of Onset  . Heart attack Brother 37  . Diabetes Brother   . Arthritis Mother   . Asthma Mother   . Heart disease Maternal Uncle   . Heart disease Maternal Grandfather   . Alcohol abuse Maternal Grandfather      Social History Mr. Creque reports that he quit smoking about 8 years ago. His smoking use included Cigarettes. He started smoking about 26 years ago. He has a 18 pack-year smoking history. He has never used smokeless tobacco. Mr. Veltre reports that he does not drink alcohol.   Review of Systems CONSTITUTIONAL: No weight loss, fever, chills, weakness or fatigue.  HEENT: Eyes: No visual loss, blurred vision, double vision or yellow sclerae.No hearing loss, sneezing, congestion, runny nose or sore throat.  SKIN: No rash or itching.  CARDIOVASCULAR: no chest pain, no palpitations.  RESPIRATORY: No shortness of breath, cough or sputum.  GASTROINTESTINAL: No anorexia, nausea, vomiting or diarrhea. No abdominal pain or blood.  GENITOURINARY: No burning on urination, no polyuria NEUROLOGICAL: No headache, dizziness, syncope, paralysis,  ataxia, numbness or tingling in the extremities. No change in bowel or bladder control.  MUSCULOSKELETAL: No muscle, back pain, joint pain or stiffness.  LYMPHATICS: No enlarged nodes. No history of splenectomy.  PSYCHIATRIC: No history of depression or anxiety.  ENDOCRINOLOGIC: No reports of sweating, cold or heat intolerance. No polyuria or polydipsia.  Marland Kitchen   Physical Examination Filed Vitals:   03/21/16 1543  BP: 112/74  Pulse: 86   Filed Vitals:   03/21/16 1543  Height: 5\' 11"  (1.803 m)  Weight: 354 lb 3.2 oz (160.664 kg)    Gen: resting comfortably, no acute distress HEENT: no scleral icterus, pupils equal round and reactive, no palptable cervical  adenopathy,  CV: RRR, no m/r/g, no jvd Resp: Clear to auscultation bilaterally GI: abdomen is soft, non-tender, non-distended, normal bowel sounds, no hepatosplenomegaly MSK: extremities are warm, 1+ bilateral LE edema Skin: warm, no rash Neuro:  no focal deficits Psych: appropriate affect   Diagnostic Studies /2016 Cath ANGIOGRAPHY:  Left main: Moderate size vessel which trifurcated into an LAD and intermediate vessel and left circumflex coronary artery.  LAD: The LAD was subtotally occluded at its ostium and there was diffuse 95-99% ostial proximal stenosis and then was totally occluded in the region of the first diagonal branch. There was a gap and then faint filling of a second diagonal branch which had a stent and there was faint filling of the LAD beyond the diagonal vessel via collaterals.  Ramus Intermediate: Small caliber vessel free of significant disease.  Left circumflex: Large caliber vessel that gave rise to 2 major marginal branches and then in the posterior lateral like coronary artery. The first marginal branch was moderate size and had proximal 90% followed by 80% stenoses. A distal superior branch had 95% stenosis.  Right coronary artery: Moderate size vessel that had 95% mid stenosis and 80% distal  stenosis in the region of the acute margin. The vessel supplied the PDA. There were septal collaterals to the LAD.   Left ventriculography revealed dilated ventricle with an ejection fraction of 25% with diffuse global hypokinesis   IMPRESSION:  Severe ischemic dilated cardiomyopathy with an ejection fraction approximately 25%.  Severe multivessel coronary obstructive disease with diffusely diseased subtotal occlusion of the ostial and proximal LAD with faint collaterals to the first and second diagonal vessel and more distal LAD; left circumflex coronary artery with tandem 90 and 80% obtuse marginal 1 stenosis with distal 95% stenosis in this distal superior branch of this marginal vessel; and 95% mid RCA stenosis with 80% stenosis in the region of the acute margin with evidence for septal collaterals from the PDA to the LAD.  RECOMMENDATION:  The patient has surgical anatomy and CABG revascularization surgery will be recommended with surgical consultation in the morning. The patient was started on Aggrastat post procedure to reduce likelihood of progressive thrombosis. An attempt was made to initiate low-dose IV nitroglycerin, but the patient transiently dropped his blood pressure and this was discontinued.  10/2014 TEE Study Conclusions  - Left ventricle: Septal apical and anterior wall hypokinesis The cavity size was severely dilated. Systolic function was severely reduced. The estimated ejection fraction was in the range of 25% to 30%. - Aortic valve: There was trivial regurgitation. - Left atrium: The atrium was mildly dilated. - Atrial septum: No defect or patent foramen ovale was identified. - Pericardium, extracardiac: Small pericardial effusion IVC flat no evidence of tamponade.    02/2015 Echo Study Conclusions  - Left ventricle: The cavity size was mildly dilated. There was mild concentric hypertrophy. Systolic function was moderately to severely reduced.  The estimated ejection fraction was in the range of 30% to 35%. Contrast enhancement utilized. Doppler parameters are consistent with abnormal left ventricular relaxation (grade 1 diastolic dysfunction). Doppler parameters are consistent with high ventricular filling pressure. - Regional wall motion abnormality: Akinesis of the mid anterior, basal-mid anteroseptal, apical septal, and apical myocardium; severe hypokinesis of the mid inferoseptal myocardium; moderate hypokinesis of the apical lateral myocardium; mild hypokinesis of the mid anterolateral myocardium. - Aortic valve: Mildly to moderately calcified annulus. - Aorta: Mild to moderate aortic root dilatation. Aortic root dimension: 44 mm (ED). - Mitral valve: Mildly  calcified annulus. Mildly thickened leaflets . - Left atrium: The atrium was mildly dilated. Volume/bsa, ES, (1-plane Simpson&'s, A2C): 33.2 ml/m^2. - Right ventricle: Systolic function was mildly reduced.  5/3/1/7 Clinic EKG (performed and reviewed in clinic) Sinus tach    Assessment and Plan   1. CAD/Acute on Chronic systolic HF/ICM - previous MI, found to have severe multivessel disease, s/p 4 vessel CABG Jan 2016. Echo 02/2015 LVEF 30-35% - he has elected to hold off on ICD for now, will continue to follow at this time - weight and edema improving on toresmide, we will continue at 40mg  bid. Repeat BMET and Mg in 1 month. - we will change losartan to entresto 49/51mg  bid, titrate up at next visit if tolerated.   2./ HTN - at goal, continue current meds  F/u 6 weeks.      Antoine Poche, M.D.

## 2016-03-21 NOTE — Patient Instructions (Signed)
Your physician recommends that you schedule a follow-up appointment in: 6 WEEKS WITH DR. BRANCH  Your physician has recommended you make the following change in your medication:   STOP VALSARTAN   START ENTRESTO 49/51 MG TWICE DAILY  Your physician recommends that you return for lab work in: 1 MONTH BMP/MG  Thank you for choosing [facility]!!

## 2016-03-23 ENCOUNTER — Other Ambulatory Visit: Payer: Self-pay | Admitting: *Deleted

## 2016-03-23 MED ORDER — SACUBITRIL-VALSARTAN 49-51 MG PO TABS: 1.0000 | ORAL_TABLET | Freq: Two times a day (BID) | ORAL | Status: DC

## 2016-03-26 ENCOUNTER — Encounter: Payer: Self-pay | Admitting: Family

## 2016-03-27 ENCOUNTER — Encounter: Payer: Self-pay | Admitting: Cardiology

## 2016-03-28 ENCOUNTER — Other Ambulatory Visit: Payer: Self-pay | Admitting: Family

## 2016-03-28 ENCOUNTER — Other Ambulatory Visit: Payer: Self-pay | Admitting: *Deleted

## 2016-03-28 MED ORDER — TRAMADOL HCL 50 MG PO TABS: 50.0000 mg | ORAL_TABLET | Freq: Two times a day (BID) | ORAL | Status: DC | PRN

## 2016-03-28 MED ORDER — SACUBITRIL-VALSARTAN 49-51 MG PO TABS: 1.0000 | ORAL_TABLET | Freq: Two times a day (BID) | ORAL | Status: DC

## 2016-03-28 NOTE — Telephone Encounter (Signed)
Per [employer], patient is pre approved and can go ahead and have first month free sent out with a new prescription from office. New prescription sent to [facility]-fax # [phone].

## 2016-04-01 ENCOUNTER — Other Ambulatory Visit: Payer: Self-pay | Admitting: Cardiology

## 2016-04-04 ENCOUNTER — Encounter: Payer: Self-pay | Admitting: Cardiology

## 2016-04-18 ENCOUNTER — Other Ambulatory Visit: Payer: Self-pay | Admitting: Pharmacist

## 2016-04-18 ENCOUNTER — Encounter: Payer: Self-pay | Admitting: Pharmacist

## 2016-04-18 ENCOUNTER — Ambulatory Visit (INDEPENDENT_AMBULATORY_CARE_PROVIDER_SITE_OTHER): Payer: BLUE CROSS/BLUE SHIELD | Admitting: Pharmacist

## 2016-04-18 VITALS — BP 120/78 | HR 82 | Ht 71.0 in | Wt 351.5 lb

## 2016-04-18 DIAGNOSIS — E118 Type 2 diabetes mellitus with unspecified complications: Secondary | ICD-10-CM

## 2016-04-18 DIAGNOSIS — Z794 Long term (current) use of insulin: Secondary | ICD-10-CM | POA: Diagnosis not present

## 2016-04-18 DIAGNOSIS — IMO0002 Reserved for concepts with insufficient information to code with codable children: Secondary | ICD-10-CM

## 2016-04-18 DIAGNOSIS — E1165 Type 2 diabetes mellitus with hyperglycemia: Secondary | ICD-10-CM

## 2016-04-18 NOTE — Patient Instructions (Signed)
Diabetes and Standards of Medical Care   Diabetes is complicated. You may find that your diabetes team includes a dietitian, nurse, diabetes educator, eye doctor, and more. To help everyone know what is going on and to help you get the care you deserve, the following schedule of care was developed to help keep you on track. Below are the tests, exams, vaccines, medicines, education, and plans you will need.  Blood Glucose Goals Prior to meals = 80 - 130 Within 2 hours of the start of a meal = less than 180  HbA1c test (goal is less than 7.0% - your last value was 10.5%) This test shows how well you have controlled your glucose over the past 2 to 3 months. It is used to see if your diabetes management plan needs to be adjusted.   It is performed at least 2 times a year if you are meeting treatment goals.  It is performed 4 times a year if therapy has changed or if you are not meeting treatment goals.  Blood pressure test  This test is performed at every routine medical visit. The goal is less than 140/90 mmHg for most people, but 130/80 mmHg in some cases. Ask your health care provider about your goal.  Dental exam  Follow up with the dentist regularly.  Eye exam  If you are diagnosed with type 1 diabetes as a child, get an exam upon reaching the age of 10 years or older and have had diabetes for 3 to 5 years. Yearly eye exams are recommended after that initial eye exam.  If you are diagnosed with type 1 diabetes as an adult, get an exam within 5 years of diagnosis and then yearly.  If you are diagnosed with type 2 diabetes, get an exam as soon as possible after the diagnosis and then yearly.  Foot care exam  Visual foot exams are performed at every routine medical visit. The exams check for cuts, injuries, or other problems with the feet.  A comprehensive foot exam should be done yearly. This includes visual inspection as well as assessing foot pulses and testing for loss of  sensation.  Check your feet nightly for cuts, injuries, or other problems with your feet. Tell your health care provider if anything is not healing.  Kidney function test (urine microalbumin)  This test is performed once a year.  Type 1 diabetes: The first test is performed 5 years after diagnosis.  Type 2 diabetes: The first test is performed at the time of diagnosis.  A serum creatinine and estimated glomerular filtration rate (eGFR) test is done once a year to assess the level of chronic kidney disease (CKD), if present.  Lipid profile (cholesterol, HDL, LDL, triglycerides)  Performed every 5 years for most people.  The goal for LDL is less than 100 mg/dL. If you are at high risk, the goal is less than 70 mg/dL.  The goal for HDL is 40 mg/dL to 50 mg/dL for men and 50 mg/dL to 60 mg/dL for women. An HDL cholesterol of 60 mg/dL or higher gives some protection against heart disease.  The goal for triglycerides is less than 150 mg/dL.  Influenza vaccine, pneumococcal vaccine, and hepatitis B vaccine  The influenza vaccine is recommended yearly.  The pneumococcal vaccine is generally given once in a lifetime. However, there are some instances when another vaccination is recommended. Check with your health care provider.  The hepatitis B vaccine is also recommended for adults with diabetes.    Diabetes self-management education  Education is recommended at diagnosis and ongoing as needed.  Treatment plan  Your treatment plan is reviewed at every medical visit.  Document Released: 07/24/2009 Document Revised: 05/29/2013 Document Reviewed: 02/26/2013 [employer] Patient Information 2014 [employer]   

## 2016-04-18 NOTE — Telephone Encounter (Signed)
error 

## 2016-04-18 NOTE — Progress Notes (Signed)
Subjective:    Mark Leach is a 45 y.o. male who presents for re-evaluation of Type 2 diabetes mellitus.  Current symptoms/problems include hyperglycemia and polyuria   The patient was initially diagnosed with Type 2 diabetes mellitus about 2 years ago.   Known diabetic complications: cardiovascular disease and cerebrovascular disease Cardiovascular risk factors: diabetes mellitus, dyslipidemia, hypertension, male gender, obesity (BMI >= 30 kg/m2) and sedentary lifestyle   Current diabetic medications include metformin 1000mg  bid, Levemir 50 units once daily, Victoza 1.2mg  qd.  However patient states he was having pain in his right thumb and has not taken Victoza or Levemir in about 2 weeks.  Eye exam current (within one year): yes Weight trend: decreasing steadily - has lost about 20# over last 3 months (but had gained weight over the last year - about 20#) Prior visit with dietician: yes  Current diet:  patient is trying to limit portion sizes.  He is avoiding high fat meats and fried foods.   mostly drinks water but occasionally has a MtDew energy drink with has 15 grams of CHO Current exercise: none  Current monitoring regimen: checking about once per week. Home blood sugar records: patient did not bring meter  Any episodes of hypoglycemia? no  Is He on ACE inhibitor or angiotensin II receptor blocker?  Entresto - started 02/2016  The following portions of the patient's history were reviewed and updated as appropriate: allergies, current medications, past family history, past medical history, past social history, past surgical history and problem list.   Objective:    BP 120/78 mmHg  Pulse 82  Ht 5\' 11"  (1.803 m)  Wt 351 lb 8 oz (159.439 kg)  BMI 49.05 kg/m2   Date A1c  02/22/2016 10.5%  02/10/2016 10.5%  11/10/2015 9.3%  08/07/2015 8.0%  04/16/2015 6.8%  11/20/2014 6.5%      Lab Review GLUCOSE (mg/dL)  Date Value  53/66/440305/15/2017 217*  11/24/2015 233*  11/10/2015  189*   GLUCOSE, BLD (mg/dL)  Date Value  47/42/595601/17/2016 192*  10/23/2014 132*  10/22/2014 129*   CO2 (mmol/L)  Date Value  02/22/2016 27  11/24/2015 26  11/10/2015 25   BUN (mg/dL)  Date Value  38/75/643305/15/2017 22  11/24/2015 24  11/10/2015 16  10/26/2014 22  10/23/2014 18  10/22/2014 30*   CREATININE, SER (mg/dL)  Date Value  29/51/884105/15/2017 1.11  11/24/2015 1.13  11/10/2015 0.98    Assessment:    Diabetes Mellitus type II, under inadequate control.   Medication management / non compliance Obesity - weight continues to decrease, back at baseline from 1 year ago  Plan:    1.  Rx changes:  Continue metformin1000mg  bid and discussed importance of taking regularly  Restart Victoza QD - start with 0.6mg  for 1 weeks then increase to 1.2mg  QD.    Restart Levemir 50 units once daily  Will look into insurance coverage of combo - basal insulin + GLP-1 to decreased number of   Rx for test strip sent to his pharmacy 2.  Reviewed HBG goals.  Encouraged to check BG at least once daily.  Patient is instructed to call office if BG is greater than 200 twice within 1 week for medication adjustment.   3.  Reviewed CHO counting with patient.   4.  Increase physical activity as able - try to walk 10 minutes daily.  5.   Appt with PCP in 4 weeks  Henrene Pastorammy Jamon [place], PharmD, CPP, CDE

## 2016-04-20 ENCOUNTER — Other Ambulatory Visit: Payer: Self-pay | Admitting: Family

## 2016-05-03 ENCOUNTER — Ambulatory Visit (INDEPENDENT_AMBULATORY_CARE_PROVIDER_SITE_OTHER): Payer: Self-pay | Admitting: Cardiology

## 2016-05-03 DIAGNOSIS — R0989 Other specified symptoms and signs involving the circulatory and respiratory systems: Secondary | ICD-10-CM

## 2016-05-04 ENCOUNTER — Encounter: Payer: Self-pay | Admitting: *Deleted

## 2016-05-10 ENCOUNTER — Encounter: Payer: Self-pay | Admitting: Cardiology

## 2016-05-10 ENCOUNTER — Ambulatory Visit (INDEPENDENT_AMBULATORY_CARE_PROVIDER_SITE_OTHER): Payer: BLUE CROSS/BLUE SHIELD | Admitting: Cardiology

## 2016-05-10 VITALS — BP 152/86 | HR 97 | Ht 71.0 in | Wt 355.0 lb

## 2016-05-10 DIAGNOSIS — I5022 Chronic systolic (congestive) heart failure: Secondary | ICD-10-CM | POA: Diagnosis not present

## 2016-05-10 DIAGNOSIS — I2510 Atherosclerotic heart disease of native coronary artery without angina pectoris: Secondary | ICD-10-CM | POA: Diagnosis not present

## 2016-05-10 DIAGNOSIS — I10 Essential (primary) hypertension: Secondary | ICD-10-CM | POA: Diagnosis not present

## 2016-05-10 MED ORDER — SACUBITRIL-VALSARTAN 97-103 MG PO TABS: 1.0000 | ORAL_TABLET | Freq: Two times a day (BID) | ORAL | 6 refills | Status: DC

## 2016-05-10 NOTE — Patient Instructions (Signed)
Medication Instructions:   Increase Entresto to 97/103mg  twice a day  - new sent to [facility] today.  Continue all other medications.    Labwork:  BMET, Mg - orders given today.  Due in 2 weeks, around 05/23/2016.    Office will contact with results via phone or letter.    Testing/Procedures: None.  Follow-Up: 3 months   Any Other Special Instructions Will Be Listed Below (If Applicable).  If you need a refill on your cardiac medications before your next appointment, please call your pharmacy.

## 2016-05-10 NOTE — Progress Notes (Signed)
Clinical Summary Mark Leach is a 45 y.o.male seen today for follow up of the following medical problems.  1. CAD/Chronic systolic HF/ICM - hx of prior anterior STEMI in April 2014 at [facility], received stent to LAD - admit Jan 2016 with MI, cath severe CAD as described below with LVEF by LV gram 25% referred for CABG - s/p CABG Jan 2016 (LIMA-LAD, SVG-diag, SVG-PDA,SVG-OM) - 02/2015 echo: LVEF 30-35% - seen by EP 02/2015 for ICD evaluation, patient requested to hold off at that time.  - since last visit he reports noncompliance with diuretics. He felt they were making him urinate too often. He has had some recent LE edema, no significant SOB or DOE.  - compliant with other meds    Past Medical History:  Diagnosis Date  . Chronic systolic CHF (congestive heart failure) (HCC)   . Coronary artery disease 01/2013   anterior MI with cath showing 95% pLAD, 90% diag, 30% OM1, 70% mRCA, 60% dRCA s/p PCI with DES to LAD and diag and EF 40%  . Diabetes mellitus type 2, uncontrolled, with complications (HCC)   . DJD (degenerative joint disease)    knees  . Hypertension   . Ischemic dilated cardiomyopathy   . Morbid obesity (HCC)   . 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 Stroke [facility]) 10/12/2014   Patient with several month h/o left sided visual field deficit and MRI of brain revealing:  1. Medial right occipital lobe subacute infarct. 2. At least 3 punctate areas of acute nonhemorrhagic infarct are present involving the left thalamus, high posterior right frontal lobe and high a medial posterior left frontal or parietal lobe. 3. Additional white matter changes are noted beyond the areas of  . Visual field defect 10/12/2014     Allergies  Allergen Reactions  . Nitroglycerin Other (See Comments)    Hypotension, syncope, bradycardia     Current Outpatient Prescriptions  Medication Sig Dispense Refill  . amLODipine (NORVASC) 5  MG tablet Take 1 tablet (5 mg total) by mouth daily. 90 tablet 3  . aspirin 81 MG tablet Take 81 mg by mouth daily.    Marland Kitchen atorvastatin (LIPITOR) 80 MG tablet TAKE 1 TABLET DAILY AT 6 PM. 30 tablet 6  . citalopram (CELEXA) 40 MG tablet TAKE ONE TABLET BY MOUTH AT BEDTIME 30 tablet 1  . glucose blood (BAYER CONTOUR NEXT TEST) test strip Use to check BG up to TID. 100 each 4  . Insulin Pen Needle (EASY TOUCH PEN NEEDLES) 31G X 8 MM MISC Use to administer insulin q hs. DX E11.9 50 each PRN  . Insulin Syringes, Disposable, U-100 1 ML MISC Use once daily 60 each 11  . LEVEMIR FLEXTOUCH 100 UNIT/ML Pen INJECT 50 UNITS SUBCUTANEOUSLY ONCE A DAY AT 10PM. (Patient not taking: Reported on 04/18/2016) 15 mL 1  . Liraglutide 18 MG/3ML SOPN Inject 0.6mg  SQ daily for 2 weeks, then increase to 1.2mg  daily (Patient not taking: Reported on 04/18/2016) 9 mL 1  . magnesium oxide (MAG-OX) 400 MG tablet Take 1 tab 2 times daily for 5 days then 1 tab daily 40 tablet 3  . Magnesium Oxide 400 (240 Mg) MG TABS Take 1 tab daily 30 tablet 0  . metFORMIN (GLUCOPHAGE) 1000 MG tablet Take 1 tablet (1,000 mg total) by mouth 2 (two) times daily with a meal. 180 tablet 1  .  metFORMIN (GLUCOPHAGE) 500 MG tablet TAKE 1 TABLET BY MOUTH TWICE DAILY WITH FOOD. 60 tablet 0  . metoprolol (TOPROL-XL) 200 MG 24 hr tablet TAKE 1 TABLET ONCE DAILY. 30 tablet 6  . Misc Natural Products (BLACK CHERRY CONCENTRATE PO) Take 1 tablet by mouth daily as needed (gout flare-up).     . sacubitril-valsartan (ENTRESTO) 49-51 MG Take 1 tablet by mouth 2 (two) times daily. 180 tablet 3  . spironolactone (ALDACTONE) 25 MG tablet TAKE (1/2) TABLET BY MOUTH DAILY. 15 tablet 6  . torsemide (DEMADEX) 20 MG tablet Take 2 tablets (40 mg total) by mouth 2 (two) times daily. 120 tablet 6  . traMADol (ULTRAM) 50 MG tablet Take 1-2 tablets (50-100 mg total) by mouth every 12 (twelve) hours as needed. 90 tablet 0   No current facility-administered medications for this  visit.      Past Surgical History:  Procedure Laterality Date  . CARDIAC CATHETERIZATION  01/2013   [facility] in [place]  . CHOLECYSTECTOMY    . CORONARY ANGIOPLASTY  01/2013   PCI with stenting of LAD  . CORONARY ARTERY BYPASS GRAFT N/A 10/13/2014   Procedure: CORONARY ARTERY BYPASS GRAFTING (CABG), ON PUMP, TIMES FOUR, USING LEFT INTERNAL MAMMARY ARTERY, BILATERAL GREATER SAPHENOUS VEINS HARVESTED ENDOSCOPICALLY;  Surgeon: Purcell Nails, MD;  Location: [facility] OR;  Service: Open Heart Surgery;  Laterality: N/A;  . LEFT HEART CATH N/A 10/10/2014   Procedure: LEFT HEART CATH;  Surgeon: Lennette Bihari, MD;  Location: [facility] CATH LAB;  Service: Cardiovascular;  Laterality: N/A;  . MULTIPLE EXTRACTIONS WITH ALVEOLOPLASTY N/A 10/22/2014   Procedure: Extraction of tooth #'s 1,3,4,5,6,7,8,9,10,11,12,13,14,15,16,20,21,22,24, 25,26,27,28,29,30,32 with alveoloplasty.;  Surgeon: Charlynne Pander, DDS;  Location: [facility] OR;  Service: Oral Surgery;  Laterality: N/A;     Allergies  Allergen Reactions  . Nitroglycerin Other (See Comments)    Hypotension, syncope, bradycardia      Family History  Problem Relation Age of Onset  . Heart attack Brother 37  . Diabetes Brother   . Arthritis Mother   . Asthma Mother   . Heart disease Maternal Uncle   . Heart disease Maternal Grandfather   . Alcohol abuse Maternal Grandfather      Social History Mark Leach reports that he quit smoking about 8 years ago. His smoking use included Cigarettes. He started smoking about 26 years ago. He has a 18.00 pack-year smoking history. He has never used smokeless tobacco. Mark Leach reports that he does not drink alcohol.   Review of Systems CONSTITUTIONAL: No weight loss, fever, chills, weakness or fatigue.  HEENT: Eyes: No visual loss, blurred vision, double vision or yellow sclerae.No hearing loss, sneezing, congestion, runny nose or sore throat.  SKIN: No rash or itching.  CARDIOVASCULAR: no chest pain, no  palpitations. RESPIRATORY: No shortness of breath, cough or sputum.  GASTROINTESTINAL: No anorexia, nausea, vomiting or diarrhea. No abdominal pain or blood.  GENITOURINARY: No burning on urination, no polyuria NEUROLOGICAL: No headache, dizziness, syncope, paralysis, ataxia, numbness or tingling in the extremities. No change in bowel or bladder control.  MUSCULOSKELETAL: No muscle, back pain, joint pain or stiffness.  LYMPHATICS: No enlarged nodes. No history of splenectomy.  PSYCHIATRIC: No history of depression or anxiety.  ENDOCRINOLOGIC: No reports of sweating, cold or heat intolerance. No polyuria or polydipsia.  Marland Kitchen   Physical Examination Vitals:   05/10/16 1621  BP: (!) 152/86  Pulse: 97   Vitals:   05/10/16 1621  Weight: (!) 355 lb (161 kg)  Height: 5\' 11"  (1.803 m)    Gen: resting comfortably, no acute distress HEENT: no scleral icterus, pupils equal round and reactive, no palptable cervical adenopathy,  CV: RRR, no m//r,g no jvd Resp: Clear to auscultation bilaterally GI: abdomen is soft, non-tender, non-distended, normal bowel sounds, no hepatosplenomegaly MSK: extremities are warm, no edema.  Skin: warm, no rash Neuro:  no focal deficits Psych: appropriate affect   Diagnostic Studies 2016 Cath ANGIOGRAPHY:  Left main: Moderate size vessel which trifurcated into an LAD and intermediate vessel and left circumflex coronary artery.  LAD: The LAD was subtotally occluded at its ostium and there was diffuse 95-99% ostial proximal stenosis and then was totally occluded in the region of the first diagonal Byard Carranza. There was a gap and then faint filling of a second diagonal Berdia Lachman which had a stent and there was faint filling of the LAD beyond the diagonal vessel via collaterals.  [place] Intermediate: Small caliber vessel free of significant disease.  Left circumflex: Large caliber vessel that gave rise to 2 major marginal branches and then in the posterior lateral  like coronary artery. The first marginal Ladena Jacquez was moderate size and had proximal 90% followed by 80% stenoses. A distal superior Colen Eltzroth had 95% stenosis.  Right coronary artery: Moderate size vessel that had 95% mid stenosis and 80% distal stenosis in the region of the acute margin. The vessel supplied the PDA. There were septal collaterals to the LAD.   Left ventriculography revealed dilated ventricle with an ejection fraction of 25% with diffuse global hypokinesis   IMPRESSION:  Severe ischemic dilated cardiomyopathy with an ejection fraction approximately 25%.  Severe multivessel coronary obstructive disease with diffusely diseased subtotal occlusion of the ostial and proximal LAD with faint collaterals to the first and second diagonal vessel and more distal LAD; left circumflex coronary artery with tandem 90 and 80% obtuse marginal 1 stenosis with distal 95% stenosis in this distal superior Lavone Weisel of this marginal vessel; and 95% mid RCA stenosis with 80% stenosis in the region of the acute margin with evidence for septal collaterals from the PDA to the LAD.  RECOMMENDATION:  The patient has surgical anatomy and CABG revascularization surgery will be recommended with surgical consultation in the morning. The patient was started on Aggrastat post procedure to reduce likelihood of progressive thrombosis. An attempt was made to initiate low-dose IV nitroglycerin, but the patient transiently dropped his blood pressure and this was discontinued.  10/2014 TEE Study Conclusions  - Left ventricle: Septal apical and anterior wall hypokinesis The cavity size was severely dilated. Systolic function was severely reduced. The estimated ejection fraction was in the range of 25% to 30%. - Aortic valve: There was trivial regurgitation. - Left atrium: The atrium was mildly dilated. - Atrial septum: No defect or patent foramen ovale was identified. - Pericardium, extracardiac:  Small pericardial effusion IVC flat no evidence of tamponade.    02/2015 Echo Study Conclusions  - Left ventricle: The cavity size was mildly dilated. There was mild concentric hypertrophy. Systolic function was moderately to severely reduced. The estimated ejection fraction was in the range of 30% to 35%. Contrast enhancement utilized. Doppler parameters are consistent with abnormal left ventricular relaxation (grade 1 diastolic dysfunction). Doppler parameters are consistent with high ventricular filling pressure. - Regional wall motion abnormality: Akinesis of the mid anterior, basal-mid anteroseptal, apical septal, and apical myocardium; severe hypokinesis of the mid inferoseptal myocardium; moderate hypokinesis of the apical lateral myocardium; mild hypokinesis of the mid anterolateral myocardium. -  Aortic valve: Mildly to moderately calcified annulus. - Aorta: Mild to moderate aortic root dilatation. Aortic root dimension: 44 mm (ED). - Mitral valve: Mildly calcified annulus. Mildly thickened leaflets . - Left atrium: The atrium was mildly dilated. Volume/bsa, ES, (1-plane Simpson&'s, A2C): 33.2 ml/m^2. - Right ventricle: Systolic function was mildly reduced.  5/3/1/7 Clinic EKG (performed and reviewed in clinic) Sinus tach      Assessment and Plan  1. CAD/Acute on Chronic systolic HF/ICM - previous MI, found to have severe multivessel disease, s/p 4 vessel CABG Jan 2016. Echo 02/2015 LVEF 30-35% - he has elected to hold off on ICD for now, will continue to follow at this time - encouraged compliance with his torsemide - we will increase entresto to 97/103mg  bid,check BMET in 2 weeks.     2. HTN - elevated, we will see if improves with increased diuresis and increased entresto dose    F/u 3 months     Antoine Poche, M.D.

## 2016-05-12 ENCOUNTER — Other Ambulatory Visit: Payer: Self-pay | Admitting: Cardiology

## 2016-05-12 ENCOUNTER — Other Ambulatory Visit: Payer: Self-pay | Admitting: *Deleted

## 2016-05-12 MED ORDER — SACUBITRIL-VALSARTAN 97-103 MG PO TABS: 1.0000 | ORAL_TABLET | Freq: Two times a day (BID) | ORAL | 3 refills | Status: DC

## 2016-05-12 NOTE — Telephone Encounter (Signed)
Entresto dose increase and rx sent to [facility] patient assistance as requested by pt.

## 2016-05-17 ENCOUNTER — Other Ambulatory Visit: Payer: Self-pay | Admitting: *Deleted

## 2016-05-17 MED ORDER — TRAMADOL HCL 50 MG PO TABS: 50.0000 mg | ORAL_TABLET | Freq: Two times a day (BID) | ORAL | 3 refills | Status: DC | PRN

## 2016-05-17 NOTE — Telephone Encounter (Signed)
RX for Tramadol called into [facility] per [employer]y Hawks

## 2016-05-17 NOTE — Telephone Encounter (Signed)
Tramadol #90 take 1-2 PO every 12 hrs prn given 03/28/2016 no refills. If approved route to Nurse Pool A for nurse to call in.

## 2016-05-24 ENCOUNTER — Ambulatory Visit (INDEPENDENT_AMBULATORY_CARE_PROVIDER_SITE_OTHER): Payer: BLUE CROSS/BLUE SHIELD | Admitting: Family

## 2016-05-24 ENCOUNTER — Encounter: Payer: Self-pay | Admitting: Family

## 2016-05-24 VITALS — BP 137/88 | HR 90 | Temp 97.3°F | Ht 71.0 in | Wt 357.4 lb

## 2016-05-24 DIAGNOSIS — E785 Hyperlipidemia, unspecified: Secondary | ICD-10-CM

## 2016-05-24 DIAGNOSIS — E118 Type 2 diabetes mellitus with unspecified complications: Secondary | ICD-10-CM

## 2016-05-24 DIAGNOSIS — E559 Vitamin D deficiency, unspecified: Secondary | ICD-10-CM

## 2016-05-24 DIAGNOSIS — I2510 Atherosclerotic heart disease of native coronary artery without angina pectoris: Secondary | ICD-10-CM | POA: Diagnosis not present

## 2016-05-24 DIAGNOSIS — G4733 Obstructive sleep apnea (adult) (pediatric): Secondary | ICD-10-CM

## 2016-05-24 DIAGNOSIS — Z951 Presence of aortocoronary bypass graft: Secondary | ICD-10-CM

## 2016-05-24 DIAGNOSIS — M1990 Unspecified osteoarthritis, unspecified site: Secondary | ICD-10-CM | POA: Insufficient documentation

## 2016-05-24 DIAGNOSIS — IMO0002 Reserved for concepts with insufficient information to code with codable children: Secondary | ICD-10-CM

## 2016-05-24 DIAGNOSIS — I5043 Acute on chronic combined systolic (congestive) and diastolic (congestive) heart failure: Secondary | ICD-10-CM

## 2016-05-24 DIAGNOSIS — M159 Polyosteoarthritis, unspecified: Secondary | ICD-10-CM

## 2016-05-24 DIAGNOSIS — M150 Primary generalized (osteo)arthritis: Secondary | ICD-10-CM

## 2016-05-24 DIAGNOSIS — Z794 Long term (current) use of insulin: Secondary | ICD-10-CM

## 2016-05-24 DIAGNOSIS — E1165 Type 2 diabetes mellitus with hyperglycemia: Secondary | ICD-10-CM

## 2016-05-24 DIAGNOSIS — I10 Essential (primary) hypertension: Secondary | ICD-10-CM | POA: Diagnosis not present

## 2016-05-24 DIAGNOSIS — F4323 Adjustment disorder with mixed anxiety and depressed mood: Secondary | ICD-10-CM

## 2016-05-24 DIAGNOSIS — I2102 ST elevation (STEMI) myocardial infarction involving left anterior descending coronary artery: Secondary | ICD-10-CM | POA: Diagnosis not present

## 2016-05-24 LAB — BAYER DCA HB A1C WAIVED: HB A1C: 8.9 % — AB (ref <7.0)

## 2016-05-24 NOTE — Patient Instructions (Signed)
Diabetes Mellitus and Food It is important for you to manage your blood sugar (glucose) level. Your blood glucose level can be greatly affected by what you eat. Eating healthier foods in the appropriate amounts throughout the day at about the same time each day will help you control your blood glucose level. It can also help slow or prevent worsening of your diabetes mellitus. Healthy eating may even help you improve the level of your blood pressure and reach or maintain a healthy weight.  General recommendations for healthful eating and cooking habits include:  Eating meals and snacks regularly. Avoid going long periods of time without eating to lose weight.  Eating a diet that consists mainly of plant-based foods, such as fruits, vegetables, nuts, legumes, and whole grains.  Using low-heat cooking methods, such as baking, instead of high-heat cooking methods, such as deep frying. Work with your dietitian to make sure you understand how to use the Nutrition Facts information on food labels. HOW CAN FOOD AFFECT ME? Carbohydrates Carbohydrates affect your blood glucose level more than any other type of food. Your dietitian will help you determine how many carbohydrates to eat at each meal and teach you how to count carbohydrates. Counting carbohydrates is important to keep your blood glucose at a healthy level, especially if you are using insulin or taking certain medicines for diabetes mellitus. Alcohol Alcohol can cause sudden decreases in blood glucose (hypoglycemia), especially if you use insulin or take certain medicines for diabetes mellitus. Hypoglycemia can be a life-threatening condition. Symptoms of hypoglycemia (sleepiness, dizziness, and disorientation) are similar to symptoms of having too much alcohol.  If your health care provider has given you approval to drink alcohol, do so in moderation and use the following guidelines:  Women should not have more than one drink per day, and men  should not have more than two drinks per day. One drink is equal to:  12 oz of beer.  5 oz of wine.  1 oz of hard liquor.  Do not drink on an empty stomach.  Keep yourself hydrated. Have water, diet soda, or unsweetened iced tea.  Regular soda, juice, and other mixers might contain a lot of carbohydrates and should be counted. WHAT FOODS ARE NOT RECOMMENDED? As you make food choices, it is important to remember that all foods are not the same. Some foods have fewer nutrients per serving than other foods, even though they might have the same number of calories or carbohydrates. It is difficult to get your body what it needs when you eat foods with fewer nutrients. Examples of foods that you should avoid that are high in calories and carbohydrates but low in nutrients include:  Trans fats (most processed foods list trans fats on the Nutrition Facts label).  Regular soda.  Juice.  Candy.  Sweets, such as cake, pie, doughnuts, and cookies.  Fried foods. WHAT FOODS CAN I EAT? Eat nutrient-rich foods, which will nourish your body and keep you healthy. The food you should eat also will depend on several factors, including:  The calories you need.  The medicines you take.  Your weight.  Your blood glucose level.  Your blood pressure level.  Your cholesterol level. You should eat a variety of foods, including:  Protein.  Lean cuts of meat.  Proteins low in saturated fats, such as fish, egg whites, and beans. Avoid processed meats.  Fruits and vegetables.  Fruits and vegetables that may help control blood glucose levels, such as apples, mangoes, and   yams.  Dairy products.  Choose fat-free or low-fat dairy products, such as milk, yogurt, and cheese.  Grains, bread, pasta, and rice.  Choose whole grain products, such as multigrain bread, whole oats, and brown rice. These foods may help control blood pressure.  Fats.  Foods containing healthful fats, such as nuts,  avocado, olive oil, canola oil, and fish. DOES EVERYONE WITH DIABETES MELLITUS HAVE THE SAME MEAL PLAN? Because every person with diabetes mellitus is different, there is not one meal plan that works for everyone. It is very important that you meet with a dietitian who will help you create a meal plan that is just right for you.   This information is not intended to replace advice given to you by your health care provider. Make sure you discuss any questions you have with your health care provider.   Document Released: 06/23/2005 Document Revised: 10/17/2014 Document Reviewed: 08/23/2013 Elsevier Interactive Patient Education 2016 Elsevier Inc.  

## 2016-05-24 NOTE — Progress Notes (Signed)
Subjective:    Patient ID: Mark Leach, male    DOB: 1971-05-21, 45 y.o.   MRN: [id number]   Pt presents to the office today for chronic follow up. Pt is followed by his Cardiologists every  3 month for CHF, HX of CABG, and HX of stroke and MI. Pt was placed on Entresto.  Pt states he is suppose to weight himself daily, but he is noncompliant and states he stopped his "fluid pill" for a few days because he was "peeing too much", but has since restarted.    Diabetes  He presents for his follow-up diabetic visit. He has type 2 diabetes mellitus. His disease course has been worsening. Hypoglycemia symptoms include nervousness/anxiousness. Pertinent negatives for hypoglycemia include no confusion, headaches or mood changes. Pertinent negatives for diabetes include no blurred vision, no foot paresthesias, no foot ulcerations and no visual change. Pertinent negatives for hypoglycemia complications include no blackouts and no hospitalization. Symptoms are worsening. Diabetic complications include a CVA and heart disease. Pertinent negatives for diabetic complications include no nephropathy or peripheral neuropathy. Risk factors for coronary artery disease include diabetes mellitus, dyslipidemia, family history, hypertension, male sex, obesity and sedentary lifestyle. Current diabetic treatment includes insulin injections and oral agent (monotherapy). He is compliant with treatment all of the time. His weight is stable. He is following a generally unhealthy diet. His breakfast blood glucose range is generally 140-180 mg/dl. ( ) An ACE inhibitor/angiotensin II receptor blocker is being taken. Eye exam is current.  Hypertension  This is a chronic problem. The current episode started more than 1 year ago. The problem has been resolved since onset. The problem is controlled. Associated symptoms include anxiety, peripheral edema ("just a little") and shortness of breath. Pertinent negatives include no blurred  vision, headaches or palpitations. Risk factors for coronary artery disease include diabetes mellitus, dyslipidemia, male gender and obesity. Past treatments include ACE inhibitors, beta blockers and diuretics. The current treatment provides mild improvement. Hypertensive end-organ damage includes CAD/MI, CVA and heart failure. There is no history of kidney disease or a thyroid problem. Identifiable causes of hypertension include sleep apnea.  Hyperlipidemia  This is a chronic problem. The current episode started more than 1 year ago. The problem is controlled. Recent lipid tests were reviewed and are normal. Exacerbating diseases include diabetes and obesity. He has no history of hypothyroidism. Associated symptoms include shortness of breath. Pertinent negatives include no leg pain or myalgias. Current antihyperlipidemic treatment includes statins. The current treatment provides significant improvement of lipids. Risk factors for coronary artery disease include diabetes mellitus, dyslipidemia, family history, hypertension, male sex, obesity and a sedentary lifestyle.  Anxiety  Presents for follow-up visit. Symptoms include depressed mood, excessive worry, nervous/anxious behavior and shortness of breath. Patient reports no confusion, dry mouth, insomnia, irritability, palpitations or panic. Symptoms occur occasionally. The severity of symptoms is moderate. The symptoms are aggravated by family issues. The quality of sleep is good.   His past medical history is significant for anxiety/panic attacks and depression. There is no history of CAD. Past treatments include SSRIs. Compliance with prior treatments has been good.  Arthritis  Presents for follow-up visit. The disease course has been fluctuating. He complains of pain and joint swelling. Affected locations include the left elbow, left knee and right knee. His pain is at a severity of 8/10. Pertinent negatives include no dry eyes, dry mouth, dysuria or  pain while resting. His past medical history is significant for osteoarthritis.  OSA PT states  he wears a CPA nightly. This is stable.    Review of Systems  Constitutional: Negative.  Negative for irritability.  HENT: Negative.   Eyes: Negative for blurred vision.  Respiratory: Positive for shortness of breath.   Cardiovascular: Negative.  Negative for palpitations.  Gastrointestinal: Negative.   Endocrine: Negative.   Genitourinary: Negative.  Negative for dysuria.  Musculoskeletal: Positive for arthritis and joint swelling. Negative for myalgias.  Neurological: Negative.  Negative for headaches.  Hematological: Negative.   Psychiatric/Behavioral: Negative for confusion. The patient is nervous/anxious. The patient does not have insomnia.   All other systems reviewed and are negative.      Objective:   Physical Exam  Constitutional: He is oriented to person, place, and time. He appears well-developed and well-nourished. No distress.  Pt morbid obese   HENT:  Head: Normocephalic.  Right Ear: External ear normal.  Left Ear: External ear normal.  Mouth/Throat: Oropharynx is clear and moist.  Eyes: Pupils are equal, round, and reactive to light. Right eye exhibits no discharge. Left eye exhibits no discharge.  Neck: Normal range of motion. Neck supple. No thyromegaly present.  Cardiovascular: Normal rate, regular rhythm, normal heart sounds and intact distal pulses.   No murmur heard. Pulmonary/Chest: Effort normal and breath sounds normal. No respiratory distress. He has no wheezes.  Abdominal: Soft. Bowel sounds are normal. He exhibits no distension. There is no tenderness.  Musculoskeletal: Normal range of motion. He exhibits edema (3+ in BLE  ). He exhibits no tenderness.  Neurological: He is alert and oriented to person, place, and time. He has normal reflexes. No cranial nerve deficit.  Skin: Skin is warm and dry. No rash noted. No erythema.  Psychiatric: He has a normal  mood and affect. His behavior is normal. Judgment and thought content normal.  Vitals reviewed.     BP 137/88   Pulse 90   Temp 97.3 F (36.3 C) (Oral)   Ht '5\' 11"'$  (1.803 m)   Wt (!) 357 lb 6.4 oz (162.1 kg)   BMI 49.85 kg/m      Assessment & Plan:  1. Acute on chronic combined systolic and diastolic heart failure (HCC) - CMP14+EGFR  2. Coronary artery disease involving native coronary artery of native heart without angina pectoris - CMP14+EGFR  3. Essential hypertension - CMP14+EGFR  4. ST elevation myocardial infarction (STEMI) involving left anterior descending (LAD) coronary artery with complication (HCC) - ASN05+LZJQ  5. Obstructive sleep apnea - CMP14+EGFR  6. Uncontrolled type 2 diabetes mellitus with complication, with long-term current use of insulin (HCC) - Bayer DCA Hb A1c Waived - CMP14+EGFR  7. Adjustment disorder with mixed anxiety and depressed mood - CMP14+EGFR  8. Hyperlipidemia - CMP14+EGFR - Lipid panel  9. Morbid obesity, unspecified obesity type ([place]) - CMP14+EGFR  10. S/P CABG x 4 - CMP14+EGFR  11. Vitamin D deficiency - CMP14+EGFR  12. Primary osteoarthritis involving multiple joints - CMP14+EGFR   Continue all meds, Keep all appts with Cardiologists  Labs pending Health Maintenance reviewed Diet and exercise encouraged RTO 3 months  Evelina Dun, FNP

## 2016-05-25 LAB — CMP14+EGFR
ALT: 127 IU/L — AB (ref 0–44)
AST: 43 IU/L — AB (ref 0–40)
Albumin/Globulin Ratio: 1.3 (ref 1.2–2.2)
Albumin: 4 g/dL (ref 3.5–5.5)
Alkaline Phosphatase: 349 IU/L — ABNORMAL HIGH (ref 39–117)
BUN/Creatinine Ratio: 10 (ref 9–20)
BUN: 9 mg/dL (ref 6–24)
Bilirubin Total: 0.8 mg/dL (ref 0.0–1.2)
CALCIUM: 9.4 mg/dL (ref 8.7–10.2)
CO2: 24 mmol/L (ref 18–29)
CREATININE: 0.86 mg/dL (ref 0.76–1.27)
Chloride: 94 mmol/L — ABNORMAL LOW (ref 96–106)
GFR calc Af Amer: 122 mL/min/{1.73_m2} (ref >59)
GFR, EST NON AFRICAN AMERICAN: 105 mL/min/{1.73_m2} (ref >59)
GLOBULIN, TOTAL: 3.2 g/dL (ref 1.5–4.5)
GLUCOSE: 171 mg/dL — AB (ref 65–99)
Potassium: 4.8 mmol/L (ref 3.5–5.2)
Sodium: 138 mmol/L (ref 134–144)
Total Protein: 7.2 g/dL (ref 6.0–8.5)

## 2016-05-25 LAB — LIPID PANEL
Chol/HDL Ratio: 5.7 ratio units — ABNORMAL HIGH (ref 0.0–5.0)
Cholesterol, Total: 159 mg/dL (ref 100–199)
HDL: 28 mg/dL — AB (ref >39)
LDL CALC: 101 mg/dL — AB (ref 0–99)
Triglycerides: 152 mg/dL — ABNORMAL HIGH (ref 0–149)
VLDL CHOLESTEROL CAL: 30 mg/dL (ref 5–40)

## 2016-05-26 ENCOUNTER — Other Ambulatory Visit: Payer: Self-pay | Admitting: Family

## 2016-05-26 DIAGNOSIS — R748 Abnormal levels of other serum enzymes: Secondary | ICD-10-CM

## 2016-06-01 ENCOUNTER — Other Ambulatory Visit: Payer: Self-pay | Admitting: Physician Assistant

## 2016-06-01 ENCOUNTER — Ambulatory Visit (HOSPITAL_COMMUNITY)
Admission: RE | Admit: 2016-06-01 | Discharge: 2016-06-01 | Disposition: A | Discharge status: Home / Self Care | Payer: BLUE CROSS/BLUE SHIELD | Source: Ambulatory Visit | Attending: Family | Admitting: Family

## 2016-06-01 DIAGNOSIS — R748 Abnormal levels of other serum enzymes: Secondary | ICD-10-CM

## 2016-06-01 DIAGNOSIS — K760 Fatty (change of) liver, not elsewhere classified: Secondary | ICD-10-CM | POA: Insufficient documentation

## 2016-06-01 DIAGNOSIS — Z9049 Acquired absence of other specified parts of digestive tract: Secondary | ICD-10-CM | POA: Insufficient documentation

## 2016-07-14 ENCOUNTER — Other Ambulatory Visit: Payer: Self-pay | Admitting: Family

## 2016-07-14 ENCOUNTER — Other Ambulatory Visit: Payer: Self-pay | Admitting: Pharmacist

## 2016-08-25 ENCOUNTER — Ambulatory Visit: Payer: BLUE CROSS/BLUE SHIELD | Admitting: Family

## 2016-09-06 ENCOUNTER — Ambulatory Visit: Payer: Self-pay | Admitting: Cardiology

## 2016-09-12 ENCOUNTER — Ambulatory Visit (INDEPENDENT_AMBULATORY_CARE_PROVIDER_SITE_OTHER): Payer: BLUE CROSS/BLUE SHIELD | Admitting: Family

## 2016-09-12 ENCOUNTER — Encounter: Payer: Self-pay | Admitting: Family

## 2016-09-12 VITALS — BP 139/87 | HR 102 | Temp 98.5°F | Ht 71.0 in | Wt 355.6 lb

## 2016-09-12 DIAGNOSIS — Z794 Long term (current) use of insulin: Secondary | ICD-10-CM

## 2016-09-12 DIAGNOSIS — B351 Tinea unguium: Secondary | ICD-10-CM | POA: Diagnosis not present

## 2016-09-12 DIAGNOSIS — G4733 Obstructive sleep apnea (adult) (pediatric): Secondary | ICD-10-CM

## 2016-09-12 DIAGNOSIS — I5043 Acute on chronic combined systolic (congestive) and diastolic (congestive) heart failure: Secondary | ICD-10-CM

## 2016-09-12 DIAGNOSIS — Z951 Presence of aortocoronary bypass graft: Secondary | ICD-10-CM

## 2016-09-12 DIAGNOSIS — M159 Polyosteoarthritis, unspecified: Secondary | ICD-10-CM

## 2016-09-12 DIAGNOSIS — M150 Primary generalized (osteo)arthritis: Secondary | ICD-10-CM

## 2016-09-12 DIAGNOSIS — E559 Vitamin D deficiency, unspecified: Secondary | ICD-10-CM | POA: Diagnosis not present

## 2016-09-12 DIAGNOSIS — E118 Type 2 diabetes mellitus with unspecified complications: Secondary | ICD-10-CM

## 2016-09-12 DIAGNOSIS — F4323 Adjustment disorder with mixed anxiety and depressed mood: Secondary | ICD-10-CM | POA: Diagnosis not present

## 2016-09-12 DIAGNOSIS — E1165 Type 2 diabetes mellitus with hyperglycemia: Secondary | ICD-10-CM

## 2016-09-12 DIAGNOSIS — Z23 Encounter for immunization: Secondary | ICD-10-CM

## 2016-09-12 DIAGNOSIS — R600 Localized edema: Secondary | ICD-10-CM

## 2016-09-12 DIAGNOSIS — I2510 Atherosclerotic heart disease of native coronary artery without angina pectoris: Secondary | ICD-10-CM

## 2016-09-12 DIAGNOSIS — E785 Hyperlipidemia, unspecified: Secondary | ICD-10-CM | POA: Diagnosis not present

## 2016-09-12 DIAGNOSIS — R609 Edema, unspecified: Secondary | ICD-10-CM

## 2016-09-12 DIAGNOSIS — IMO0002 Reserved for concepts with insufficient information to code with codable children: Secondary | ICD-10-CM

## 2016-09-12 DIAGNOSIS — I10 Essential (primary) hypertension: Secondary | ICD-10-CM

## 2016-09-12 LAB — BAYER DCA HB A1C WAIVED: HB A1C (BAYER DCA - WAIVED): 7.6 % — ABNORMAL HIGH (ref <7.0)

## 2016-09-12 NOTE — Progress Notes (Addendum)
Subjective:    Patient ID: Mark Leach, male    DOB: 1971/04/21, 45 y.o.   MRN: [id number]   Pt presents to the office today for chronic follow up. Pt is followed by his Cardiologists every  3 month for CHF, HX of CABG, and HX of stroke and MI. Pt was placed on Entresto.  Pt states he is suppose to weight himself daily, but he is noncompliant and states he stopped his "fluid pill" for a few days because he was "peeing too much".    Diabetes  He presents for his follow-up diabetic visit. He has type 2 diabetes mellitus. His disease course has been worsening. Hypoglycemia symptoms include nervousness/anxiousness. Pertinent negatives for hypoglycemia include no confusion, headaches or mood changes. Pertinent negatives for diabetes include no blurred vision, no foot paresthesias, no foot ulcerations and no visual change. Pertinent negatives for hypoglycemia complications include no blackouts and no hospitalization. Symptoms are worsening. Diabetic complications include a CVA and heart disease. Pertinent negatives for diabetic complications include no nephropathy or peripheral neuropathy. Risk factors for coronary artery disease include diabetes mellitus, dyslipidemia, family history, hypertension, male sex, obesity and sedentary lifestyle. Current diabetic treatment includes insulin injections and oral agent (monotherapy). He is compliant with treatment all of the time. His weight is stable. He is following a generally unhealthy diet. His breakfast blood glucose range is generally 140-180 mg/dl. ( ) An ACE inhibitor/angiotensin II receptor blocker is being taken. Eye exam is current.  Hypertension  This is a chronic problem. The current episode started more than 1 year ago. The problem has been resolved since onset. The problem is controlled. Associated symptoms include anxiety and peripheral edema ("just a little"). Pertinent negatives include no blurred vision, headaches, palpitations or shortness of  breath. Risk factors for coronary artery disease include diabetes mellitus, dyslipidemia, male gender and obesity. Past treatments include ACE inhibitors, beta blockers and diuretics. The current treatment provides mild improvement. Hypertensive end-organ damage includes CAD/MI, CVA and heart failure. There is no history of kidney disease or a thyroid problem. Identifiable causes of hypertension include sleep apnea.  Hyperlipidemia  This is a chronic problem. The current episode started more than 1 year ago. The problem is controlled. Recent lipid tests were reviewed and are normal. Exacerbating diseases include diabetes and obesity. He has no history of hypothyroidism. Pertinent negatives include no leg pain, myalgias or shortness of breath. Current antihyperlipidemic treatment includes statins. The current treatment provides significant improvement of lipids. Risk factors for coronary artery disease include diabetes mellitus, dyslipidemia, family history, hypertension, male sex, obesity and a sedentary lifestyle.  Anxiety  Presents for follow-up visit. Symptoms include depressed mood, excessive worry and nervous/anxious behavior. Patient reports no confusion, dry mouth, insomnia, irritability, palpitations, panic or shortness of breath. Symptoms occur occasionally. The severity of symptoms is moderate. The symptoms are aggravated by family issues. The quality of sleep is good.   His past medical history is significant for anxiety/panic attacks and depression. There is no history of CAD. Past treatments include SSRIs. Compliance with prior treatments has been good.  Arthritis  Presents for follow-up visit. The disease course has been fluctuating. He complains of pain and joint swelling. The symptoms have been improving. Affected locations include the left elbow, left knee and right knee. His pain is at a severity of 2/10 (comes and goes). Pertinent negatives include no dry eyes, dry mouth, dysuria or pain  while resting. His past medical history is significant for osteoarthritis.  [facility] PT  states he wears a CPA nightly. This is stable.  Peripheral Edema Pt is having "weeping edema" in right lower leg that started several weeks ago. Pt is not taking his lasix. Has swelling in bilateral legs.   Review of Systems  Constitutional: Negative.  Negative for irritability.  HENT: Negative.   Eyes: Negative for blurred vision.  Respiratory: Negative for shortness of breath.   Cardiovascular: Negative.  Negative for palpitations.  Gastrointestinal: Negative.   Endocrine: Negative.   Genitourinary: Negative.  Negative for dysuria.  Musculoskeletal: Positive for arthritis and joint swelling. Negative for myalgias.  Neurological: Negative.  Negative for headaches.  Hematological: Negative.   Psychiatric/Behavioral: Negative for confusion. The patient is nervous/anxious. The patient does not have insomnia.   All other systems reviewed and are negative.      Objective:   Physical Exam  Constitutional: He is oriented to person, place, and time. He appears well-developed and well-nourished. No distress.  Pt morbid obese   HENT:  Head: Normocephalic.  Right Ear: External ear normal.  Left Ear: External ear normal.  Mouth/Throat: Oropharynx is clear and moist.  Eyes: Pupils are equal, round, and reactive to light. Right eye exhibits no discharge. Left eye exhibits no discharge.  Neck: Normal range of motion. Neck supple. No thyromegaly present.  Cardiovascular: Normal rate, regular rhythm, normal heart sounds and intact distal pulses.   No murmur heard. Pulmonary/Chest: Effort normal and breath sounds normal. No respiratory distress. He has no wheezes.  Abdominal: Soft. Bowel sounds are normal. He exhibits no distension. There is no tenderness.  Musculoskeletal: Normal range of motion. He exhibits edema (3+ in BLE  ). He exhibits no tenderness.  Neurological: He is alert and oriented to person, place,  and time. He has normal reflexes. No cranial nerve deficit.  Skin: Skin is warm and dry. No rash noted. No erythema.  Psychiatric: He has a normal mood and affect. His behavior is normal. Judgment and thought content normal.  Vitals reviewed.    BP (!) 148/85   Pulse (!) 104   Temp 98.5 F (36.9 C) (Oral)   Ht _0  (1.803 m)   Wt (!) 355 lb 9.6 oz (161.3 kg)   BMI 49.60 kg/m      Assessment & Plan:  1. Coronary artery disease involving native coronary artery of native heart without angina pectoris - CMP14+EGFR  2. Essential hypertension - CMP14+EGFR  3. Acute on chronic combined systolic and diastolic heart failure (HCC) - CMP14+EGFR  4. Obstructive sleep apnea - CMP14+EGFR  5. Vitamin D deficiency - CMP14+EGFR  6. S/P CABG x 4 - CMP14+EGFR  7. Morbid obesity (HCC) - CMP14+EGFR  8. Hyperlipidemia, unspecified hyperlipidemia type - CMP14+EGFR - Lipid panel  9. Primary osteoarthritis involving multiple joints - CMP14+EGFR  10. Uncontrolled type 2 diabetes mellitus with complication, with long-term current use of insulin ([place]) - Bayer DCA Hb A1c Waived - CMP14+EGFR - Ambulatory referral to Ophthalmology - Ambulatory referral to Podiatry  11. Adjustment disorder with mixed anxiety and depressed mood - CMP14+EGFR  12. Toenail fungus - CMP14+EGFR - Ambulatory referral to Podiatry  13. Peripheral edema -Pt to restart Demadex 40 mg BID! Low salt diet Feet elevated when possible Compression hose   Continue all meds Labs pending Health Maintenance reviewed Diet and exercise encouraged RTO 3 months   Evelina Dun, FNP

## 2016-09-12 NOTE — Patient Instructions (Signed)

## 2016-09-13 LAB — CMP14+EGFR
A/G RATIO: 1.4 (ref 1.2–2.2)
ALT: 25 IU/L (ref 0–44)
AST: 19 IU/L (ref 0–40)
Albumin: 4.2 g/dL (ref 3.5–5.5)
Alkaline Phosphatase: 191 IU/L — ABNORMAL HIGH (ref 39–117)
BUN/Creatinine Ratio: 14 (ref 9–20)
BUN: 14 mg/dL (ref 6–24)
Bilirubin Total: 0.8 mg/dL (ref 0.0–1.2)
CALCIUM: 9.3 mg/dL (ref 8.7–10.2)
CO2: 24 mmol/L (ref 18–29)
CREATININE: 0.97 mg/dL (ref 0.76–1.27)
Chloride: 99 mmol/L (ref 96–106)
GFR, EST AFRICAN AMERICAN: 109 mL/min/{1.73_m2} (ref >59)
GFR, EST NON AFRICAN AMERICAN: 95 mL/min/{1.73_m2} (ref >59)
GLUCOSE: 142 mg/dL — AB (ref 65–99)
Globulin, Total: 2.9 g/dL (ref 1.5–4.5)
Potassium: 4.8 mmol/L (ref 3.5–5.2)
Sodium: 139 mmol/L (ref 134–144)
TOTAL PROTEIN: 7.1 g/dL (ref 6.0–8.5)

## 2016-09-13 LAB — LIPID PANEL
CHOL/HDL RATIO: 5.9 ratio — AB (ref 0.0–5.0)
Cholesterol, Total: 171 mg/dL (ref 100–199)
HDL: 29 mg/dL — AB (ref >39)
LDL CALC: 118 mg/dL — AB (ref 0–99)
Triglycerides: 118 mg/dL (ref 0–149)
VLDL CHOLESTEROL CAL: 24 mg/dL (ref 5–40)

## 2016-09-22 LAB — HM DIABETES EYE EXAM

## 2016-10-18 ENCOUNTER — Ambulatory Visit: Payer: Self-pay | Admitting: Cardiology

## 2016-10-25 ENCOUNTER — Ambulatory Visit: Payer: Self-pay | Admitting: Internal Medicine

## 2016-10-28 ENCOUNTER — Ambulatory Visit: Payer: Self-pay | Admitting: Internal Medicine

## 2016-12-05 ENCOUNTER — Other Ambulatory Visit: Payer: Self-pay | Admitting: *Deleted

## 2016-12-06 ENCOUNTER — Telehealth: Payer: Self-pay | Admitting: Family

## 2016-12-07 ENCOUNTER — Other Ambulatory Visit: Payer: Self-pay | Admitting: Family

## 2016-12-07 ENCOUNTER — Ambulatory Visit (INDEPENDENT_AMBULATORY_CARE_PROVIDER_SITE_OTHER): Payer: BLUE CROSS/BLUE SHIELD | Admitting: Cardiology

## 2016-12-07 ENCOUNTER — Encounter: Payer: Self-pay | Admitting: Cardiology

## 2016-12-07 VITALS — BP 161/106 | HR 101 | Ht 71.0 in | Wt 365.0 lb

## 2016-12-07 DIAGNOSIS — I10 Essential (primary) hypertension: Secondary | ICD-10-CM | POA: Diagnosis not present

## 2016-12-07 DIAGNOSIS — I2510 Atherosclerotic heart disease of native coronary artery without angina pectoris: Secondary | ICD-10-CM

## 2016-12-07 DIAGNOSIS — I5022 Chronic systolic (congestive) heart failure: Secondary | ICD-10-CM

## 2016-12-07 NOTE — Progress Notes (Signed)
Clinical Summary Mark Leach is a 46 y.o.male seen today for follow up of the following medical problems.   1. CAD/Chronic systolic HF/ICM - [facility], received stent to LAD - admit Jan 2016 with MI, cath severe CAD as described below with LVEF by LV gram 25% referred for CABG - s/p CABG Jan 2016 (LIMA-LAD, SVG-diag, SVG-PDA,SVG-OM) - 02/2015 echo: LVEF 30-35% - seen by EP 02/2015 for ICD evaluation, patient requested to hold off at that time.   - LE edema is stable. Does not weight at home - mixed compliance with meds. Has not taken torsemide in some time -  Past Medical History:  Diagnosis Date  . Chronic systolic CHF (congestive heart failure) (HCC)   . Coronary artery disease 01/2013   anterior MI with cath showing 95% pLAD, 90% diag, 30% OM1, 70% mRCA, 60% dRCA s/p PCI with DES to LAD and diag and EF 40%  . Diabetes mellitus type 2, uncontrolled, with complications (HCC)   . DJD (degenerative joint disease)    knees  . Hypertension   . Ischemic dilated cardiomyopathy (HCC)   . Morbid obesity (HCC)   . 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 Stroke [facility](HCC) 10/12/2014   Patient with several month h/o left sided visual field deficit and MRI of brain revealing:  1. Medial right occipital lobe subacute infarct. 2. At least 3 punctate areas of acute nonhemorrhagic infarct are present involving the left thalamus, high posterior right frontal lobe and high a medial posterior left frontal or parietal lobe. 3. Additional white matter changes are noted beyond the areas of  . Visual field defect 10/12/2014     Allergies  Allergen Reactions  . Nitroglycerin Other (See Comments)    Hypotension, syncope, bradycardia     Current Outpatient Prescriptions  Medication Sig Dispense Refill  . amLODipine (NORVASC) 5 MG tablet Take 1 tablet (5 mg total) by mouth daily. 90 tablet 3  . aspirin  81 MG tablet Take 81 mg by mouth daily.    Marland Kitchen. atorvastatin (LIPITOR) 80 MG tablet TAKE 1 TABLET DAILY AT 6 PM. 30 tablet 6  . citalopram (CELEXA) 40 MG tablet TAKE ONE TABLET BY MOUTH AT BEDTIME 30 tablet 1  . glucose blood (BAYER CONTOUR NEXT TEST) test strip Use to check BG up to TID. 100 each 4  . Insulin Pen Needle (EASY TOUCH PEN NEEDLES) 31G X 8 MM MISC Use to administer insulin q hs. DX E11.9 50 each PRN  . Insulin Syringes, Disposable, U-100 1 ML MISC Use once daily 60 each 11  . LEVEMIR FLEXTOUCH 100 UNIT/ML Pen INJECT 50 UNITS SUBCUTANEOUSLY ONCE A DAY AT 10PM. 15 mL 1  . Magnesium Oxide 400 (240 Mg) MG TABS TAKE (1) TABLET BY MOUTH ONCE DAILY. 30 tablet 6  . metFORMIN (GLUCOPHAGE) 1000 MG tablet Take 1 tablet (1,000 mg total) by mouth 2 (two) times daily with a meal. 180 tablet 1  . metoprolol (TOPROL-XL) 200 MG 24 hr tablet TAKE 1 TABLET ONCE DAILY. 30 tablet 6  . Misc Natural Products (BLACK CHERRY CONCENTRATE PO) Take 1 tablet by mouth daily as needed (gout flare-up).     . sacubitril-valsartan (ENTRESTO) 97-103 MG Take 1 tablet by mouth 2 (two) times daily. 180 tablet 3  . spironolactone (ALDACTONE) 25 MG tablet TAKE (1/2) TABLET BY  MOUTH DAILY. 15 tablet 6  . torsemide (DEMADEX) 20 MG tablet Take 2 tablets (40 mg total) by mouth 2 (two) times daily. 120 tablet 6  . traMADol (ULTRAM) 50 MG tablet Take 1-2 tablets (50-100 mg total) by mouth every 12 (twelve) hours as needed. 90 tablet 3  . VICTOZA 18 MG/3ML SOPN INJECT 0.6MG  DAILY FOR 2 WEEKS THEN INCREASE TO 1.2MG  DAILY THEREAFTER. 6 mL 1   No current facility-administered medications for this visit.      Past Surgical History:  Procedure Laterality Date  . CARDIAC CATHETERIZATION  01/2013   [facility] in [place]  . CHOLECYSTECTOMY    . CORONARY ANGIOPLASTY  01/2013   PCI with stenting of LAD  . CORONARY ARTERY BYPASS GRAFT N/A 10/13/2014   Procedure: CORONARY ARTERY BYPASS GRAFTING (CABG), ON PUMP, TIMES FOUR, USING LEFT  INTERNAL MAMMARY ARTERY, BILATERAL GREATER SAPHENOUS VEINS HARVESTED ENDOSCOPICALLY;  Surgeon: Purcell Nails, MD;  Location: [facility] OR;  Service: Open Heart Surgery;  Laterality: N/A;  . LEFT HEART CATH N/A 10/10/2014   Procedure: LEFT HEART CATH;  Surgeon: Lennette Bihari, MD;  Location: [facility] CATH LAB;  Service: Cardiovascular;  Laterality: N/A;  . MULTIPLE EXTRACTIONS WITH ALVEOLOPLASTY N/A 10/22/2014   Procedure: Extraction of tooth #'s 1,3,4,5,6,7,8,9,10,11,12,13,14,15,16,20,21,22,24, 25,26,27,28,29,30,32 with alveoloplasty.;  Surgeon: Charlynne Pander, DDS;  Location: [facility] OR;  Service: Oral Surgery;  Laterality: N/A;     Allergies  Allergen Reactions  . Nitroglycerin Other (See Comments)    Hypotension, syncope, bradycardia      Family History  Problem Relation Age of Onset  . Arthritis Mother   . Asthma Mother   . Heart disease Maternal Uncle   . Heart disease Maternal Grandfather   . Alcohol abuse Maternal Grandfather   . Heart attack Brother 37  . Diabetes Brother      Social History Mr. Mittman reports that he quit smoking about 9 years ago. His smoking use included Cigarettes. He started smoking about 26 years ago. He has a 18.00 pack-year smoking history. He has never used smokeless tobacco. Mr. Oki reports that he does not drink alcohol.   Review of Systems CONSTITUTIONAL: No weight loss, fever, chills, weakness or fatigue.  HEENT: Eyes: No visual loss, blurred vision, double vision or yellow sclerae.No hearing loss, sneezing, congestion, runny nose or sore throat.  SKIN: No rash or itching.  CARDIOVASCULAR: per hpi RESPIRATORY: No shortness of breath, cough or sputum.  GASTROINTESTINAL: No anorexia, nausea, vomiting or diarrhea. No abdominal pain or blood.  GENITOURINARY: No burning on urination, no polyuria NEUROLOGICAL: No headache, dizziness, syncope, paralysis, ataxia, numbness or tingling in the extremities. No change in bowel or bladder control.    MUSCULOSKELETAL: No muscle, back pain, joint pain or stiffness.  LYMPHATICS: No enlarged nodes. No history of splenectomy.  PSYCHIATRIC: No history of depression or anxiety.  ENDOCRINOLOGIC: No reports of sweating, cold or heat intolerance. No polyuria or polydipsia.  Marland Kitchen   Physical Examination Vitals:   12/07/16 1508  BP: (!) 161/106  Pulse: (!) 101   Vitals:   12/07/16 1508  Weight: (!) 365 lb (165.6 kg)  Height: 5\' 11"  (1.803 m)    Gen: resting comfortably, no acute distress HEENT: no scleral icterus, pupils equal round and reactive, no palptable cervical adenopathy,  CV: RRR, no m/r/g, nojvd Resp: Clear to auscultation bilaterally GI: abdomen is soft, non-tender, non-distended, normal bowel sounds, no hepatosplenomegaly MSK: extremities are warm, 1+ bilateral LE edema.  Skin: warm, no rash Neuro:  no focal deficits Psych: appropriate affect   Diagnostic Studies 2016 Cath ANGIOGRAPHY:  Left main: Moderate size vessel which trifurcated into an LAD and intermediate vessel and left circumflex coronary artery.  LAD: The LAD was subtotally occluded at its ostium and there was diffuse 95-99% ostial proximal stenosis and then was totally occluded in the region of the first diagonal Cael Worth. There was a gap and then faint filling of a second diagonal Dajiah Kooi which had a stent and there was faint filling of the LAD beyond the diagonal vessel via collaterals.  [place] Intermediate: Small caliber vessel free of significant disease.  Left circumflex: Large caliber vessel that gave rise to 2 major marginal branches and then in the posterior lateral like coronary artery. The first marginal Jesua Tamblyn was moderate size and had proximal 90% followed by 80% stenoses. A distal superior Seon Gaertner had 95% stenosis.  Right coronary artery: Moderate size vessel that had 95% mid stenosis and 80% distal stenosis in the region of the acute margin. The vessel supplied the PDA. There were septal  collaterals to the LAD.   Left ventriculography revealed dilated ventricle with an ejection fraction of 25% with diffuse global hypokinesis   IMPRESSION:  Severe ischemic dilated cardiomyopathy with an ejection fraction approximately 25%.  Severe multivessel coronary obstructive disease with diffusely diseased subtotal occlusion of the ostial and proximal LAD with faint collaterals to the first and second diagonal vessel and more distal LAD; left circumflex coronary artery with tandem 90 and 80% obtuse marginal 1 stenosis with distal 95% stenosis in this distal superior Meggen Spaziani of this marginal vessel; and 95% mid RCA stenosis with 80% stenosis in the region of the acute margin with evidence for septal collaterals from the PDA to the LAD.  RECOMMENDATION:  The patient has surgical anatomy and CABG revascularization surgery will be recommended with surgical consultation in the morning. The patient was started on Aggrastat post procedure to reduce likelihood of progressive thrombosis. An attempt was made to initiate low-dose IV nitroglycerin, but the patient transiently dropped his blood pressure and this was discontinued.  10/2014 TEE Study Conclusions  - Left ventricle: Septal apical and anterior wall hypokinesis The cavity size was severely dilated. Systolic function was severely reduced. The estimated ejection fraction was in the range of 25% to 30%. - Aortic valve: There was trivial regurgitation. - Left atrium: The atrium was mildly dilated. - Atrial septum: No defect or patent foramen ovale was identified. - Pericardium, extracardiac: Small pericardial effusion IVC flat no evidence of tamponade.    02/2015 Echo Study Conclusions  - Left ventricle: The cavity size was mildly dilated. There was mild concentric hypertrophy. Systolic function was moderately to severely reduced. The estimated ejection fraction was in the range of 30% to 35%. Contrast  enhancement utilized. Doppler parameters are consistent with abnormal left ventricular relaxation (grade 1 diastolic dysfunction). Doppler parameters are consistent with high ventricular filling pressure. - Regional wall motion abnormality: Akinesis of the mid anterior, basal-mid anteroseptal, apical septal, and apical myocardium; severe hypokinesis of the mid inferoseptal myocardium; moderate hypokinesis of the apical lateral myocardium; mild hypokinesis of the mid anterolateral myocardium. - Aortic valve: Mildly to moderately calcified annulus. - Aorta: Mild to moderate aortic root dilatation. Aortic root dimension: 44 mm (ED). - Mitral valve: Mildly calcified annulus. Mildly thickened leaflets . - Left atrium: The atrium was mildly dilated. Volume/bsa, ES, (1-plane Simpson&'s, A2C): 33.2 ml/m^2. - Right ventricle: Systolic function was mildly reduced.  5/3/1/7 Clinic EKG (performed and reviewed in clinic)  Sinus tach      Assessment and Plan   1. CAD/Acute on Chronic systolic HF/ICM - previous MI, found to have severe multivessel disease, s/p 4 vessel CABG Jan 2016. Echo 02/2015 LVEF 30-35% - he has elected to hold off on ICD for now - he remains poorly compliant with his medications including his diuretics. Does not check his weights regularly at home - I reiterated to him today my concern, he has severe CHF at a young age and poor compliance will likely lead to progression of disease.  - continue current meds, strongly encouraged compliance.     2. HTN - elevated, encouraged medication compliance.     F/u 3 months     Antoine Poche, M.D.

## 2016-12-07 NOTE — Patient Instructions (Signed)
Your physician recommends that you schedule a follow-up appointment in: 3 MONTHS WITH DR BRANCH  Your physician recommends that you continue on your current medications as directed. Please refer to the Current Medication list given to you today.  Thank you for choosing [facility]!!    

## 2016-12-12 ENCOUNTER — Encounter: Payer: Self-pay | Admitting: Family

## 2016-12-12 ENCOUNTER — Ambulatory Visit (INDEPENDENT_AMBULATORY_CARE_PROVIDER_SITE_OTHER): Payer: BLUE CROSS/BLUE SHIELD | Admitting: Family

## 2016-12-12 VITALS — BP 127/88 | HR 95 | Temp 97.2°F | Ht 71.0 in | Wt 356.0 lb

## 2016-12-12 DIAGNOSIS — Z951 Presence of aortocoronary bypass graft: Secondary | ICD-10-CM | POA: Diagnosis not present

## 2016-12-12 DIAGNOSIS — I2510 Atherosclerotic heart disease of native coronary artery without angina pectoris: Secondary | ICD-10-CM | POA: Diagnosis not present

## 2016-12-12 DIAGNOSIS — Z794 Long term (current) use of insulin: Secondary | ICD-10-CM

## 2016-12-12 DIAGNOSIS — M159 Polyosteoarthritis, unspecified: Secondary | ICD-10-CM

## 2016-12-12 DIAGNOSIS — E559 Vitamin D deficiency, unspecified: Secondary | ICD-10-CM

## 2016-12-12 DIAGNOSIS — I10 Essential (primary) hypertension: Secondary | ICD-10-CM

## 2016-12-12 DIAGNOSIS — E118 Type 2 diabetes mellitus with unspecified complications: Secondary | ICD-10-CM

## 2016-12-12 DIAGNOSIS — IMO0002 Reserved for concepts with insufficient information to code with codable children: Secondary | ICD-10-CM

## 2016-12-12 DIAGNOSIS — E1165 Type 2 diabetes mellitus with hyperglycemia: Secondary | ICD-10-CM

## 2016-12-12 DIAGNOSIS — G4733 Obstructive sleep apnea (adult) (pediatric): Secondary | ICD-10-CM | POA: Diagnosis not present

## 2016-12-12 DIAGNOSIS — M150 Primary generalized (osteo)arthritis: Secondary | ICD-10-CM | POA: Diagnosis not present

## 2016-12-12 DIAGNOSIS — E785 Hyperlipidemia, unspecified: Secondary | ICD-10-CM | POA: Diagnosis not present

## 2016-12-12 DIAGNOSIS — I5043 Acute on chronic combined systolic (congestive) and diastolic (congestive) heart failure: Secondary | ICD-10-CM

## 2016-12-12 DIAGNOSIS — F4323 Adjustment disorder with mixed anxiety and depressed mood: Secondary | ICD-10-CM | POA: Diagnosis not present

## 2016-12-12 LAB — BAYER DCA HB A1C WAIVED: HB A1C: 9.8 % — AB (ref <7.0)

## 2016-12-12 MED ORDER — TRAMADOL HCL 50 MG PO TABS: 50.0000 mg | ORAL_TABLET | Freq: Two times a day (BID) | ORAL | 3 refills | Status: DC | PRN

## 2016-12-12 NOTE — Progress Notes (Signed)
Subjective:    Patient ID: Mark Leach, male    DOB: 11-15-1970, 46 y.o.   MRN: [id number]   Pt presents to the office today for chronic follow up. Pt is followed by his Cardiologists every  3 month for CHF, HX of CABG, and HX of stroke and MI. Pt was placed on Entresto.  Pt states he is suppose to weight himself daily, but he is noncompliant.  Diabetes  He presents for his follow-up diabetic visit. He has type 2 diabetes mellitus. His disease course has been worsening. Hypoglycemia symptoms include nervousness/anxiousness. Pertinent negatives for hypoglycemia include no confusion, headaches or mood changes. Pertinent negatives for diabetes include no blurred vision, no foot paresthesias, no foot ulcerations and no visual change. Pertinent negatives for hypoglycemia complications include no blackouts and no hospitalization. Symptoms are worsening. Diabetic complications include a CVA and heart disease. Pertinent negatives for diabetic complications include no nephropathy or peripheral neuropathy. Risk factors for coronary artery disease include diabetes mellitus, dyslipidemia, family history, hypertension, male sex, obesity and sedentary lifestyle. Current diabetic treatment includes insulin injections and oral agent (monotherapy). He is compliant with treatment all of the time. His weight is stable. He is following a generally unhealthy diet. His breakfast blood glucose range is generally 140-180 mg/dl. (Does not check BS regularly  ) An ACE inhibitor/angiotensin II receptor blocker is being taken. Eye exam is current.  Hypertension  This is a chronic problem. The current episode started more than 1 year ago. The problem has been resolved since onset. The problem is controlled. Associated symptoms include anxiety and peripheral edema. Pertinent negatives include no blurred vision, headaches, palpitations or shortness of breath. Risk factors for coronary artery disease include diabetes mellitus,  dyslipidemia, male gender and obesity. Past treatments include ACE inhibitors, beta blockers and diuretics. The current treatment provides mild improvement. Hypertensive end-organ damage includes CAD/MI, CVA and heart failure. There is no history of kidney disease. Identifiable causes of hypertension include sleep apnea. There is no history of a thyroid problem.  Hyperlipidemia  This is a chronic problem. The current episode started more than 1 year ago. The problem is uncontrolled. Recent lipid tests were reviewed and are high. Exacerbating diseases include diabetes and obesity. He has no history of hypothyroidism. Pertinent negatives include no leg pain, myalgias or shortness of breath. Current antihyperlipidemic treatment includes statins. The current treatment provides significant improvement of lipids. Risk factors for coronary artery disease include diabetes mellitus, dyslipidemia, family history, hypertension, male sex, obesity and a sedentary lifestyle.  Anxiety  Presents for follow-up visit. Symptoms include depressed mood, excessive worry and nervous/anxious behavior. Patient reports no confusion, dry mouth, insomnia, irritability, palpitations, panic or shortness of breath. Symptoms occur occasionally. The severity of symptoms is moderate. The symptoms are aggravated by family issues. The quality of sleep is good.   His past medical history is significant for anxiety/panic attacks and depression. There is no history of CAD. Past treatments include SSRIs. Compliance with prior treatments has been good.  Arthritis  Presents for follow-up visit. The disease course has been fluctuating. He complains of pain and joint swelling. The symptoms have been improving. Affected locations include the left elbow, left knee and right knee. His pain is at a severity of 1/10 (comes and goes). Pertinent negatives include no dry eyes, dry mouth, dysuria or pain while resting. His past medical history is significant  for osteoarthritis.  [facility] PT states he wears a CPA nightly. This is stable.  Peripheral Edema Improved  since restarting his lasix.  Has swelling in bilateral legs.   Review of Systems  Constitutional: Negative.  Negative for irritability.  HENT: Negative.   Eyes: Negative for blurred vision.  Respiratory: Negative for shortness of breath.   Cardiovascular: Negative.  Negative for palpitations.  Gastrointestinal: Negative.   Endocrine: Negative.   Genitourinary: Negative.  Negative for dysuria.  Musculoskeletal: Positive for arthritis and joint swelling. Negative for myalgias.  Neurological: Negative.  Negative for headaches.  Hematological: Negative.   Psychiatric/Behavioral: Negative for confusion. The patient is nervous/anxious. The patient does not have insomnia.   All other systems reviewed and are negative.      Objective:   Physical Exam  Constitutional: He is oriented to person, place, and time. He appears well-developed and well-nourished. No distress.  Pt morbid obese   HENT:  Head: Normocephalic.  Right Ear: External ear normal.  Left Ear: External ear normal.  Nose: Nose normal.  Mouth/Throat: Oropharynx is clear and moist.  Eyes: Pupils are equal, round, and reactive to light. Right eye exhibits no discharge. Left eye exhibits no discharge.  Neck: Normal range of motion. Neck supple. No thyromegaly present.  Cardiovascular: Normal rate, regular rhythm, normal heart sounds and intact distal pulses.   No murmur heard. Pulmonary/Chest: Effort normal and breath sounds normal. No respiratory distress. He has no wheezes.  Abdominal: Soft. Bowel sounds are normal. He exhibits no distension. There is no tenderness.  Musculoskeletal: Normal range of motion. He exhibits no tenderness. Edema: 4+ in BLE     Neurological: He is alert and oriented to person, place, and time. He has normal reflexes. No cranial nerve deficit.  Skin: Skin is warm and dry. No rash noted. No  erythema.  Psychiatric: He has a normal mood and affect. His behavior is normal. Judgment and thought content normal.  Vitals reviewed.  Diabetic Foot Exam - Simple   Simple Foot Form Diabetic Foot exam was performed with the following findings:  Yes 12/12/2016 11:35 AM  Visual Inspection No deformities, no ulcerations, no other skin breakdown bilaterally:  Yes Sensation Testing Intact to touch and monofilament testing bilaterally:  Yes Pulse Check Posterior Tibialis and Dorsalis pulse intact bilaterally:  Yes Comments      BP 127/88   Pulse 95   Temp 97.2 F (36.2 C) (Oral)   Ht _0  (1.803 m)   Wt (!) 356 lb (161.5 kg)   BMI 49.65 kg/m      Assessment & Plan:  1. Acute on chronic combined systolic and diastolic heart failure (HCC) - CMP14+EGFR  2. Coronary artery disease involving native coronary artery of native heart without angina pectoris - CMP14+EGFR  3. Essential hypertension - CMP14+EGFR  4. Obstructive sleep apnea - CMP14+EGFR  5. Uncontrolled type 2 diabetes mellitus with complication, with long-term current use of insulin (HCC)  - CMP14+EGFR - Microalbumin / creatinine urine ratio - Bayer DCA Hb A1c Waived  6. Primary osteoarthritis involving multiple joints - CMP14+EGFR - traMADol (ULTRAM) 50 MG tablet; Take 1-2 tablets (50-100 mg total) by mouth every 12 (twelve) hours as needed.  Dispense: 90 tablet; Refill: 3  7. Adjustment disorder with mixed anxiety and depressed mood  - CMP14+EGFR  8. Hyperlipidemia, unspecified hyperlipidemia type  - CMP14+EGFR - Lipid panel  9. Morbid obesity ([place]) - CMP14+EGFR  10. S/P CABG x 4  - CMP14+EGFR  11. Vitamin D deficiency  - CMP14+EGFR   Continue all meds Labs pending Health Maintenance reviewed Diet and exercise encouraged RTO  3 months   Evelina Dun, FNP

## 2016-12-12 NOTE — Patient Instructions (Signed)
Fat and Cholesterol Restricted Diet High levels of fat and cholesterol in your blood may lead to various health problems, such as diseases of the heart, blood vessels, gallbladder, liver, and pancreas. Fats are concentrated sources of energy that come in various forms. Certain types of fat, including saturated fat, may be harmful in excess. Cholesterol is a substance needed by your body in small amounts. Your body makes all the cholesterol it needs. Excess cholesterol comes from the food you eat. When you have high levels of cholesterol and saturated fat in your blood, health problems can develop because the excess fat and cholesterol will gather along the walls of your blood vessels, causing them to narrow. Choosing the right foods will help you control your intake of fat and cholesterol. This will help keep the levels of these substances in your blood within normal limits and reduce your risk of disease. What is my plan? Your health care provider recommends that you:  Limit your fat intake to ______% or less of your total calories per day.  Limit the amount of cholesterol in your diet to less than _________mg per day.  Eat 20-30 grams of fiber each day.  What types of fat should I choose?  Choose healthy fats more often. Choose monounsaturated and polyunsaturated fats, such as olive and canola oil, flaxseeds, walnuts, almonds, and seeds.  Eat more omega-3 fats. Good choices include salmon, mackerel, sardines, tuna, flaxseed oil, and ground flaxseeds. Aim to eat fish at least two times a week.  Limit saturated fats. Saturated fats are primarily found in animal products, such as meats, butter, and cream. Plant sources of saturated fats include palm oil, palm kernel oil, and coconut oil.  Avoid foods with partially hydrogenated oils in them. These contain trans fats. Examples of foods that contain trans fats are stick margarine, some tub margarines, cookies, crackers, and other baked goods. What  general guidelines do I need to follow? These guidelines for healthy eating will help you control your intake of fat and cholesterol:  Check food labels carefully to identify foods with trans fats or high amounts of saturated fat.  Fill one half of your plate with vegetables and green salads.  Fill one fourth of your plate with whole grains. Look for the word "whole" as the first word in the ingredient list.  Fill one fourth of your plate with lean protein foods.  Limit fruit to two servings a day. Choose fruit instead of juice.  Eat more foods that contain fiber, such as apples, broccoli, carrots, beans, peas, and barley.  Eat more home-cooked food and less restaurant, buffet, and fast food.  Limit or avoid alcohol.  Limit foods high in starch and sugar.  Limit fried foods.  Cook foods using methods other than frying. Baking, boiling, grilling, and broiling are all great options.  Lose weight if you are overweight. Losing just 5-10% of your initial body weight can help your overall health and prevent diseases such as diabetes and heart disease.  What foods can I eat? Grains  Whole grains, such as whole wheat or whole grain breads, crackers, cereals, and pasta. Unsweetened oatmeal, bulgur, barley, quinoa, or brown rice. Corn or whole wheat flour tortillas. Vegetables  Fresh or frozen vegetables (raw, steamed, roasted, or grilled). Green salads. Fruits  All fresh, canned (in natural juice), or frozen fruits. Meats and other protein foods  Ground beef (85% or leaner), grass-fed beef, or beef trimmed of fat. Skinless chicken or turkey. Ground chicken or turkey.   Pork trimmed of fat. All fish and seafood. Eggs. Dried beans, peas, or lentils. Unsalted nuts or seeds. Unsalted canned or dry beans. Dairy  Low-fat dairy products, such as skim or 1% milk, 2% or reduced-fat cheeses, low-fat ricotta or cottage cheese, or plain low-fat yo Fats and oils  Tub margarines without trans  fats. Light or reduced-fat mayonnaise and salad dressings. Avocado. Olive, canola, sesame, or safflower oils. Natural peanut or almond butter (choose ones without added sugar and oil). The items listed above may not be a complete list of recommended foods or beverages. Contact your dietitian for more options. Foods to avoid Grains  White bread. White pasta. White rice. Cornbread. Bagels, pastries, and croissants. Crackers that contain trans fat. Vegetables  White potatoes. Corn. Creamed or fried vegetables. Vegetables in a cheese sauce. Fruits  Dried fruits. Canned fruit in light or heavy syrup. Fruit juice. Meats and other protein foods  Fatty cuts of meat. Ribs, chicken wings, bacon, sausage, bologna, salami, chitterlings, fatback, hot dogs, bratwurst, and packaged luncheon meats. Liver and organ meats. Dairy  Whole or 2% milk, cream, half-and-half, and cream cheese. Whole milk cheeses. Whole-fat or sweetened yogurt. Full-fat cheeses. Nondairy creamers and whipped toppings. Processed cheese, cheese spreads, or cheese curds. Beverages  Alcohol. Sweetened drinks (such as sodas, lemonade, and fruit drinks or punches). Fats and oils  Butter, stick margarine, lard, shortening, ghee, or bacon fat. Coconut, palm kernel, or palm oils. Sweets and desserts  Corn syrup, sugars, honey, and molasses. Candy. Jam and jelly. Syrup. Sweetened cereals. Cookies, pies, cakes, donuts, muffins, and ice cream. The items listed above may not be a complete list of foods and beverages to avoid. Contact your dietitian for more information. This information is not intended to replace advice given to you by your health care provider. Make sure you discuss any questions you have with your health care provider. Document Released: 09/26/2005 Document Revised: 10/17/2014 Document Reviewed: 12/25/2013 [employer] Interactive Patient Education  2017 [employer]  

## 2016-12-13 ENCOUNTER — Other Ambulatory Visit: Payer: Self-pay | Admitting: Family

## 2016-12-13 LAB — LIPID PANEL
Chol/HDL Ratio: 4.7 ratio units (ref 0.0–5.0)
Cholesterol, Total: 128 mg/dL (ref 100–199)
HDL: 27 mg/dL — ABNORMAL LOW (ref >39)
LDL Calculated: 76 mg/dL (ref 0–99)
Triglycerides: 127 mg/dL (ref 0–149)
VLDL Cholesterol Cal: 25 mg/dL (ref 5–40)

## 2016-12-13 LAB — CMP14+EGFR
ALK PHOS: 173 IU/L — AB (ref 39–117)
ALT: 26 IU/L (ref 0–44)
AST: 18 IU/L (ref 0–40)
Albumin/Globulin Ratio: 1.6 (ref 1.2–2.2)
Albumin: 4.4 g/dL (ref 3.5–5.5)
BILIRUBIN TOTAL: 1 mg/dL (ref 0.0–1.2)
BUN/Creatinine Ratio: 25 — ABNORMAL HIGH (ref 9–20)
BUN: 26 mg/dL — AB (ref 6–24)
CO2: 31 mmol/L — AB (ref 18–29)
CREATININE: 1.05 mg/dL (ref 0.76–1.27)
Calcium: 9.3 mg/dL (ref 8.7–10.2)
Chloride: 94 mmol/L — ABNORMAL LOW (ref 96–106)
GFR calc Af Amer: 99 mL/min/{1.73_m2} (ref >59)
GFR calc non Af Amer: 86 mL/min/{1.73_m2} (ref >59)
GLUCOSE: 191 mg/dL — AB (ref 65–99)
Globulin, Total: 2.8 g/dL (ref 1.5–4.5)
Potassium: 4.4 mmol/L (ref 3.5–5.2)
Sodium: 139 mmol/L (ref 134–144)
Total Protein: 7.2 g/dL (ref 6.0–8.5)

## 2016-12-13 LAB — MICROALBUMIN / CREATININE URINE RATIO
Creatinine, Urine: 121.9 mg/dL
MICROALB/CREAT RATIO: 19.9 mg/g{creat} (ref 0.0–30.0)
MICROALBUM., U, RANDOM: 24.3 ug/mL

## 2016-12-20 ENCOUNTER — Ambulatory Visit: Payer: Self-pay | Admitting: Internal Medicine

## 2016-12-24 ENCOUNTER — Other Ambulatory Visit: Payer: Self-pay | Admitting: Family

## 2016-12-24 DIAGNOSIS — I10 Essential (primary) hypertension: Secondary | ICD-10-CM

## 2017-01-14 ENCOUNTER — Other Ambulatory Visit: Payer: Self-pay | Admitting: Family

## 2017-01-14 ENCOUNTER — Other Ambulatory Visit: Payer: Self-pay | Admitting: Cardiology

## 2017-01-18 ENCOUNTER — Other Ambulatory Visit: Payer: Self-pay | Admitting: Family

## 2017-01-18 MED ORDER — LIRAGLUTIDE 18 MG/3ML ~~LOC~~ SOPN: 1.2000 mg | PEN_INJECTOR | Freq: Every day | SUBCUTANEOUS | 5 refills | Status: DC

## 2017-01-30 ENCOUNTER — Other Ambulatory Visit: Payer: Self-pay | Admitting: Family

## 2017-02-14 ENCOUNTER — Other Ambulatory Visit: Payer: Self-pay | Admitting: Cardiology

## 2017-03-14 ENCOUNTER — Ambulatory Visit (INDEPENDENT_AMBULATORY_CARE_PROVIDER_SITE_OTHER): Payer: BLUE CROSS/BLUE SHIELD | Admitting: Cardiology

## 2017-03-14 ENCOUNTER — Encounter: Payer: Self-pay | Admitting: Family

## 2017-03-14 ENCOUNTER — Ambulatory Visit (INDEPENDENT_AMBULATORY_CARE_PROVIDER_SITE_OTHER): Payer: BLUE CROSS/BLUE SHIELD | Admitting: Family

## 2017-03-14 ENCOUNTER — Encounter: Payer: Self-pay | Admitting: Cardiology

## 2017-03-14 VITALS — BP 143/92 | HR 94 | Temp 97.9°F | Ht 71.0 in | Wt 363.2 lb

## 2017-03-14 VITALS — BP 141/93 | HR 99 | Ht 71.0 in | Wt 366.2 lb

## 2017-03-14 DIAGNOSIS — I2510 Atherosclerotic heart disease of native coronary artery without angina pectoris: Secondary | ICD-10-CM | POA: Diagnosis not present

## 2017-03-14 DIAGNOSIS — M150 Primary generalized (osteo)arthritis: Secondary | ICD-10-CM

## 2017-03-14 DIAGNOSIS — M159 Polyosteoarthritis, unspecified: Secondary | ICD-10-CM

## 2017-03-14 DIAGNOSIS — I5043 Acute on chronic combined systolic (congestive) and diastolic (congestive) heart failure: Secondary | ICD-10-CM | POA: Diagnosis not present

## 2017-03-14 DIAGNOSIS — Z794 Long term (current) use of insulin: Secondary | ICD-10-CM

## 2017-03-14 DIAGNOSIS — I5022 Chronic systolic (congestive) heart failure: Secondary | ICD-10-CM

## 2017-03-14 DIAGNOSIS — Z951 Presence of aortocoronary bypass graft: Secondary | ICD-10-CM

## 2017-03-14 DIAGNOSIS — E785 Hyperlipidemia, unspecified: Secondary | ICD-10-CM | POA: Diagnosis not present

## 2017-03-14 DIAGNOSIS — B359 Dermatophytosis, unspecified: Secondary | ICD-10-CM

## 2017-03-14 DIAGNOSIS — E1165 Type 2 diabetes mellitus with hyperglycemia: Secondary | ICD-10-CM

## 2017-03-14 DIAGNOSIS — I10 Essential (primary) hypertension: Secondary | ICD-10-CM | POA: Diagnosis not present

## 2017-03-14 DIAGNOSIS — G4733 Obstructive sleep apnea (adult) (pediatric): Secondary | ICD-10-CM

## 2017-03-14 DIAGNOSIS — E118 Type 2 diabetes mellitus with unspecified complications: Secondary | ICD-10-CM | POA: Diagnosis not present

## 2017-03-14 DIAGNOSIS — IMO0002 Reserved for concepts with insufficient information to code with codable children: Secondary | ICD-10-CM

## 2017-03-14 DIAGNOSIS — F4323 Adjustment disorder with mixed anxiety and depressed mood: Secondary | ICD-10-CM

## 2017-03-14 LAB — BAYER DCA HB A1C WAIVED: HB A1C: 9.2 % — AB (ref <7.0)

## 2017-03-14 MED ORDER — NAFTIFINE HCL 1 % EX CREA: TOPICAL_CREAM | Freq: Every day | CUTANEOUS | 0 refills | Status: DC

## 2017-03-14 NOTE — Patient Instructions (Signed)
Your physician recommends that you schedule a follow-up appointment in: 3 MONTHS WITH DR BRANCH  Your physician recommends that you continue on your current medications as directed. Please refer to the Current Medication list given to you today.  Thank you for choosing [facility]!!    

## 2017-03-14 NOTE — Progress Notes (Signed)
Clinical Summary Mark Leach is a 46 y.o.male seen today for follow up of the following medical problems.   1. CAD/Chronic systolic HF/ICM - hx of prior anterior STEMI in April 2014 at [facility], received stent to LAD - admit Jan 2016 with MI, cath severe CAD as described below with LVEF by LV gram 25% referred for CABG - s/p CABG Jan 2016 (LIMA-LAD, SVG-diag, SVG-PDA,SVG-OM) - 02/2015 echo: LVEF 30-35% - seen by EP 02/2015 for ICD evaluation, patient requested to hold off at that time.  - no recent SOB/DOE. Denies any LE edema - mixed compliance with meds. Mixed compliance with diuretics.  - entresto is expensive.  - no home weights. Limiting sodium intake. Avoiding NSAIDs.     2. OSA - compliant with cpap Past Medical History:  Diagnosis Date  . Chronic systolic CHF (congestive heart failure) (HCC)   . Coronary artery disease 01/2013   anterior MI with cath showing 95% pLAD, 90% diag, 30% OM1, 70% mRCA, 60% dRCA s/p PCI with DES to LAD and diag and EF 40%  . Diabetes mellitus type 2, uncontrolled, with complications (HCC)   . DJD (degenerative joint disease)    knees  . Hypertension   . Ischemic dilated cardiomyopathy (HCC)   . Morbid obesity (HCC)   . 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 Stroke [facility]) 10/12/2014   Patient with several month h/o left sided visual field deficit and MRI of brain revealing:  1. Medial right occipital lobe subacute infarct. 2. At least 3 punctate areas of acute nonhemorrhagic infarct are present involving the left thalamus, high posterior right frontal lobe and high a medial posterior left frontal or parietal lobe. 3. Additional white matter changes are noted beyond the areas of  . Visual field defect 10/12/2014     Allergies  Allergen Reactions  . Nitroglycerin Other (See Comments)    Hypotension, syncope, bradycardia     Current Outpatient Prescriptions  Medication Sig Dispense  Refill  . amLODipine (NORVASC) 5 MG tablet TAKE 1 TABLET ONCE DAILY. 30 tablet 5  . aspirin 81 MG tablet Take 81 mg by mouth daily.    Marland Kitchen atorvastatin (LIPITOR) 80 MG tablet TAKE 1 TABLET DAILY AT 6 PM. 30 tablet 3  . citalopram (CELEXA) 40 MG tablet TAKE ONE TABLET BY MOUTH AT BEDTIME 30 tablet 1  . GLOBAL EASE INJECT PEN NEEDLES 31G X 8 MM MISC USE TO ADMINISTER INSULIN AT BEDTIME. 50 each 1  . glucose blood (BAYER CONTOUR NEXT TEST) test strip Use to check BG up to TID. 100 each 4  . Insulin Admin Supplies MISC   98  . Insulin Syringes, Disposable, U-100 1 ML MISC Use once daily 60 each 11  . LEVEMIR FLEXTOUCH 100 UNIT/ML Pen INJECT 50 UNITS SUBCUTANEOUSLY ONCE A DAY AT 10PM. 15 mL 1  . Magnesium Oxide 400 (240 Mg) MG TABS TAKE (1) TABLET BY MOUTH ONCE DAILY. 30 tablet 6  . metFORMIN (GLUCOPHAGE) 1000 MG tablet Take 1 tablet (1,000 mg total) by mouth 2 (two) times daily with a meal. 180 tablet 1  . metoprolol (TOPROL-XL) 200 MG 24 hr tablet TAKE (1) TABLET BY MOUTH ONCE DAILY. 30 tablet 6  . Misc Natural Products (BLACK CHERRY CONCENTRATE PO) Take 1 tablet by mouth daily as needed (gout flare-up).     . naftifine (NAFTIN) 1 % cream Apply topically  daily. 30 g 0  . sacubitril-valsartan (ENTRESTO) 97-103 MG Take 1 tablet by mouth 2 (two) times daily. 180 tablet 3  . spironolactone (ALDACTONE) 25 MG tablet TAKE (1/2) TABLET BY MOUTH DAILY. 15 tablet 6  . torsemide (DEMADEX) 20 MG tablet Take 2 tablets (40 mg total) by mouth 2 (two) times daily. 120 tablet 6  . traMADol (ULTRAM) 50 MG tablet Take 1-2 tablets (50-100 mg total) by mouth every 12 (twelve) hours as needed. 90 tablet 3  . VICTOZA 18 MG/3ML SOPN INJECT 0.2ML (1.2MG ) INTO THE SKIN DAILY 6 mL 2   No current facility-administered medications for this visit.      Past Surgical History:  Procedure Laterality Date  . CARDIAC CATHETERIZATION  01/2013   [facility] in [place]  . CHOLECYSTECTOMY    . CORONARY ANGIOPLASTY  01/2013   PCI  with stenting of LAD  . CORONARY ARTERY BYPASS GRAFT N/A 10/13/2014   Procedure: CORONARY ARTERY BYPASS GRAFTING (CABG), ON PUMP, TIMES FOUR, USING LEFT INTERNAL MAMMARY ARTERY, BILATERAL GREATER SAPHENOUS VEINS HARVESTED ENDOSCOPICALLY;  Surgeon: Purcell Nails, MD;  Location: [facility] OR;  Service: Open Heart Surgery;  Laterality: N/A;  . LEFT HEART CATH N/A 10/10/2014   Procedure: LEFT HEART CATH;  Surgeon: Lennette Bihari, MD;  Location: [facility] CATH LAB;  Service: Cardiovascular;  Laterality: N/A;  . MULTIPLE EXTRACTIONS WITH ALVEOLOPLASTY N/A 10/22/2014   Procedure: Extraction of tooth #'s 1,3,4,5,6,7,8,9,10,11,12,13,14,15,16,20,21,22,24, 25,26,27,28,29,30,32 with alveoloplasty.;  Surgeon: Charlynne Pander, DDS;  Location: [facility] OR;  Service: Oral Surgery;  Laterality: N/A;     Allergies  Allergen Reactions  . Nitroglycerin Other (See Comments)    Hypotension, syncope, bradycardia      Family History  Problem Relation Age of Onset  . Arthritis Mother   . Asthma Mother   . Heart disease Maternal Uncle   . Heart disease Maternal Grandfather   . Alcohol abuse Maternal Grandfather   . Heart attack Brother 37  . Diabetes Brother      Social History Mark Leach reports that he quit smoking about 9 years ago. His smoking use included Cigarettes. He started smoking about 27 years ago. He has a 18.00 pack-year smoking history. He has never used smokeless tobacco. Mark Leach reports that he does not drink alcohol.   Review of Systems CONSTITUTIONAL: No weight loss, fever, chills, weakness or fatigue.  HEENT: Eyes: No visual loss, blurred vision, double vision or yellow sclerae.No hearing loss, sneezing, congestion, runny nose or sore throat.  SKIN: No rash or itching.  CARDIOVASCULAR: per hpi RESPIRATORY: No shortness of breath, cough or sputum.  GASTROINTESTINAL: No anorexia, nausea, vomiting or diarrhea. No abdominal pain or blood.  GENITOURINARY: No burning on urination, no  polyuria NEUROLOGICAL: No headache, dizziness, syncope, paralysis, ataxia, numbness or tingling in the extremities. No change in bowel or bladder control.  MUSCULOSKELETAL: No muscle, back pain, joint pain or stiffness.  LYMPHATICS: No enlarged nodes. No history of splenectomy.  PSYCHIATRIC: No history of depression or anxiety.  ENDOCRINOLOGIC: No reports of sweating, cold or heat intolerance. No polyuria or polydipsia.  Marland Kitchen   Physical Examination Vitals:   03/14/17 1354  BP: (!) 141/93  Pulse: 99   Filed Weights   03/14/17 1354  Weight: (!) 366 lb 3.2 oz (166.1 kg)    Gen: resting comfortably, no acute distress HEENT: no scleral icterus, pupils equal round and reactive, no palptable cervical adenopathy,  CV: RRR, no m/r/g, no jvd Resp: Clear to auscultation bilaterally GI: abdomen  is soft, non-tender, non-distended, normal bowel sounds, no hepatosplenomegaly MSK: extremities are warm, 1+ bilateral LE edema Skin: warm, no rash Neuro:  no focal deficits Psych: appropriate affect   Diagnostic Studies 2016 Cath ANGIOGRAPHY:  Left main: Moderate size vessel which trifurcated into an LAD and intermediate vessel and left circumflex coronary artery.  LAD: The LAD was subtotally occluded at its ostium and there was diffuse 95-99% ostial proximal stenosis and then was totally occluded in the region of the first diagonal Mark Leach. There was a gap and then faint filling of a second diagonal Mark Leach which had a stent and there was faint filling of the LAD beyond the diagonal vessel via collaterals.  [place]amus Intermediate: Small caliber vessel free of significant disease.  Left circumflex: Large caliber vessel that gave rise to 2 major marginal branches and then in the posterior lateral like coronary artery. The first marginal Mark Leach was moderate size and had proximal 90% followed by 80% stenoses. A distal superior Mark Leach had 95% stenosis.  Right coronary artery: Moderate size vessel  that had 95% mid stenosis and 80% distal stenosis in the region of the acute margin. The vessel supplied the PDA. There were septal collaterals to the LAD.   Left ventriculography revealed dilated ventricle with an ejection fraction of 25% with diffuse global hypokinesis   IMPRESSION:  Severe ischemic dilated cardiomyopathy with an ejection fraction approximately 25%.  Severe multivessel coronary obstructive disease with diffusely diseased subtotal occlusion of the ostial and proximal LAD with faint collaterals to the first and second diagonal vessel and more distal LAD; left circumflex coronary artery with tandem 90 and 80% obtuse marginal 1 stenosis with distal 95% stenosis in this distal superior Mark Leach of this marginal vessel; and 95% mid RCA stenosis with 80% stenosis in the region of the acute margin with evidence for septal collaterals from the PDA to the LAD.  RECOMMENDATION:  The patient has surgical anatomy and CABG revascularization surgery will be recommended with surgical consultation in the morning. The patient was started on Aggrastat post procedure to reduce likelihood of progressive thrombosis. An attempt was made to initiate low-dose IV nitroglycerin, but the patient transiently dropped his blood pressure and this was discontinued.  10/2014 TEE Study Conclusions  - Left ventricle: Septal apical and anterior wall hypokinesis The cavity size was severely dilated. Systolic function was severely reduced. The estimated ejection fraction was in the range of 25% to 30%. - Aortic valve: There was trivial regurgitation. - Left atrium: The atrium was mildly dilated. - Atrial septum: No defect or patent foramen ovale was identified. - Pericardium, extracardiac: Small pericardial effusion IVC flat no evidence of tamponade.    02/2015 Echo Study Conclusions  - Left ventricle: The cavity size was mildly dilated. There was mild concentric hypertrophy.  Systolic function was moderately to severely reduced. The estimated ejection fraction was in the range of 30% to 35%. Contrast enhancement utilized. Doppler parameters are consistent with abnormal left ventricular relaxation (grade 1 diastolic dysfunction). Doppler parameters are consistent with high ventricular filling pressure. - Regional wall motion abnormality: Akinesis of the mid anterior, basal-mid anteroseptal, apical septal, and apical myocardium; severe hypokinesis of the mid inferoseptal myocardium; moderate hypokinesis of the apical lateral myocardium; mild hypokinesis of the mid anterolateral myocardium. - Aortic valve: Mildly to moderately calcified annulus. - Aorta: Mild to moderate aortic root dilatation. Aortic root dimension: 44 mm (ED). - Mitral valve: Mildly calcified annulus. Mildly thickened leaflets . - Left atrium: The atrium was mildly dilated. Volume/bsa,  ES, (1-plane Simpson&'s, A2C): 33.2 ml/m^2. - Right ventricle: Systolic function was mildly reduced.  5/3/1/7 Clinic EKG (performed and reviewed in clinic) Sinus tach      Assessment and Plan  1. CAD/ Chronic systolic HF/ICM - previous MI, found to have severe multivessel disease, s/p 4 vessel CABG Jan 2016. Echo 02/2015 LVEF 30-35% - he has elected to hold off on ICD for now - poor compliance with meds, encouraged increased compliance with home meds and daily weights - ekg shows SR, no acute ischemic changes  - continue current meds         Antoine Poche, M.D.

## 2017-03-14 NOTE — Progress Notes (Signed)
Subjective:    Patient ID: Mark Leach, male    DOB: May 17, 1971, 46 y.o.   MRN: [id number]  Pt presents to the office today for chronic follow up. Pt is followed by his Cardiologists every  3 month for CHF, HX of CABG, and HX of stroke and MI. Pt was placed on Entresto.  Pt states he is suppose to weight himself daily, but he is noncompliant. Diabetes  He presents for his follow-up diabetic visit. He has type 2 diabetes mellitus. There are no hypoglycemic associated symptoms. Pertinent negatives for diabetes include no blurred vision, no foot paresthesias, no foot ulcerations and no visual change. Symptoms are stable. Diabetic complications include a CVA and heart disease. He is compliant with treatment some of the time. He is following a generally unhealthy diet. His breakfast blood glucose range is generally >200 mg/dl. An ACE inhibitor/angiotensin II receptor blocker is being taken. Eye exam is current (09/22/16).  Hypertension  This is a chronic problem. The current episode started more than 1 year ago. The problem has been waxing and waning since onset. The problem is uncontrolled. Associated symptoms include peripheral edema and shortness of breath. Pertinent negatives include no blurred vision. Risk factors for coronary artery disease include dyslipidemia, diabetes mellitus, male gender, obesity, sedentary lifestyle and family history. The current treatment provides mild improvement. Hypertensive end-organ damage includes CAD/MI, CVA and heart failure.  Hyperlipidemia  This is a chronic problem. The current episode started more than 1 year ago. The problem is controlled. Recent lipid tests were reviewed and are normal. Exacerbating diseases include obesity. Associated symptoms include shortness of breath. Current antihyperlipidemic treatment includes statins. Risk factors for coronary artery disease include a sedentary lifestyle.  Depression         This is a chronic problem.  The current  episode started more than 1 year ago.   The onset quality is gradual.   The problem occurs intermittently.  Associated symptoms include irritable, restlessness and sad.  Associated symptoms include no helplessness, no hopelessness and no decreased interest.  Past treatments include SSRIs - Selective serotonin reuptake inhibitors. Arthritis  Presents for follow-up visit. He complains of pain and stiffness. The symptoms have been worsening. Affected locations include the right knee, left knee, right MCP and left MCP. His pain is at a severity of 2/10. Associated symptoms include rash.  Rash  This is a new problem. The current episode started more than 1 month ago. The problem is unchanged. The affected locations include the chest. The rash is characterized by dryness and redness. He was exposed to nothing. Associated symptoms include shortness of breath.  OSA Pt uses CPAP every night. Stable.     Review of Systems  Eyes: Negative for blurred vision.  Respiratory: Positive for shortness of breath.   Musculoskeletal: Positive for arthritis and stiffness.  Skin: Positive for rash.  Psychiatric/Behavioral: Positive for depression.  All other systems reviewed and are negative.      Objective:   Physical Exam  Constitutional: He is oriented to person, place, and time. He appears well-developed and well-nourished. He is irritable. No distress.  Morbid obese   HENT:  Head: Normocephalic.  Right Ear: External ear normal.  Left Ear: External ear normal.  Nose: Nose normal.  Mouth/Throat: Oropharynx is clear and moist.  Eyes: Pupils are equal, round, and reactive to light. Right eye exhibits no discharge. Left eye exhibits no discharge.  Neck: Normal range of motion. Neck supple. No thyromegaly present.  Cardiovascular:  Normal rate, regular rhythm, normal heart sounds and intact distal pulses.   No murmur heard. Pulmonary/Chest: Effort normal and breath sounds normal. No respiratory distress.  He has no wheezes.  Abdominal: Soft. Bowel sounds are normal. He exhibits no distension. There is no tenderness.  Musculoskeletal: Normal range of motion. He exhibits edema (2+ BLE, discoloration of bilateral legs). He exhibits no tenderness.  Neurological: He is alert and oriented to person, place, and time.  Skin: Skin is warm and dry. No rash noted. There is erythema (rash on face).  Small erythemas circular  rash on right upper chest  Psychiatric: He has a normal mood and affect. His behavior is normal. Judgment and thought content normal.  Vitals reviewed.   BP (!) 143/92   Pulse 94   Temp 97.9 F (36.6 C) (Oral)   Ht '5\' 11"'$  (1.803 m)   Wt (!) 363 lb 3.2 oz (164.7 kg)   BMI 50.66 kg/m        Assessment & Plan:  1. Essential hypertension - CMP14+EGFR  2. Coronary artery disease involving native coronary artery of native heart without angina pectoris - CMP14+EGFR - Lipid panel  3. Acute on chronic combined systolic and diastolic heart failure (HCC) - CMP14+EGFR  4. Uncontrolled type 2 diabetes mellitus with complication, with long-term current use of insulin (HCC) - CMP14+EGFR - Bayer DCA Hb A1c Waived  5. Primary osteoarthritis involving multiple joints - CMP14+EGFR  6. Obstructive sleep apnea - CMP14+EGFR  7. Hyperlipidemia, unspecified hyperlipidemia type - CMP14+EGFR - Lipid panel  8. Morbid obesity ([place]) - CMP14+EGFR  9. S/P CABG x 4 - CMP14+EGFR  10. 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 Ringworm - naftifine (NAFTIN) 1 % cream; Apply topically daily.  Dispense: 30 g; Refill: 0   Continue all meds, keep follow up appt with Cardiologists  Labs pending Health Maintenance reviewed Diet and exercise encouraged RTO 3 months   Evelina Dun, FNP

## 2017-03-14 NOTE — Patient Instructions (Signed)

## 2017-03-15 ENCOUNTER — Other Ambulatory Visit: Payer: Self-pay | Admitting: Family

## 2017-03-15 LAB — CMP14+EGFR
A/G RATIO: 1.4 (ref 1.2–2.2)
ALT: 49 IU/L — AB (ref 0–44)
AST: 22 IU/L (ref 0–40)
Albumin: 4 g/dL (ref 3.5–5.5)
Alkaline Phosphatase: 191 IU/L — ABNORMAL HIGH (ref 39–117)
BILIRUBIN TOTAL: 0.7 mg/dL (ref 0.0–1.2)
BUN/Creatinine Ratio: 20 (ref 9–20)
BUN: 22 mg/dL (ref 6–24)
CALCIUM: 9.4 mg/dL (ref 8.7–10.2)
CHLORIDE: 97 mmol/L (ref 96–106)
CO2: 25 mmol/L (ref 18–29)
Creatinine, Ser: 1.1 mg/dL (ref 0.76–1.27)
GFR calc Af Amer: 93 mL/min/{1.73_m2} (ref >59)
GFR calc non Af Amer: 81 mL/min/{1.73_m2} (ref >59)
GLUCOSE: 186 mg/dL — AB (ref 65–99)
Globulin, Total: 2.8 g/dL (ref 1.5–4.5)
POTASSIUM: 5.1 mmol/L (ref 3.5–5.2)
Sodium: 140 mmol/L (ref 134–144)
Total Protein: 6.8 g/dL (ref 6.0–8.5)

## 2017-03-15 LAB — LIPID PANEL
CHOLESTEROL TOTAL: 194 mg/dL (ref 100–199)
Chol/HDL Ratio: 6.7 ratio — ABNORMAL HIGH (ref 0.0–5.0)
HDL: 29 mg/dL — ABNORMAL LOW (ref >39)
LDL Calculated: 143 mg/dL — ABNORMAL HIGH (ref 0–99)
Triglycerides: 108 mg/dL (ref 0–149)
VLDL CHOLESTEROL CAL: 22 mg/dL (ref 5–40)

## 2017-03-24 ENCOUNTER — Encounter: Payer: Self-pay | Admitting: *Deleted

## 2017-05-06 ENCOUNTER — Other Ambulatory Visit: Payer: Self-pay | Admitting: Family

## 2017-06-15 ENCOUNTER — Ambulatory Visit: Payer: BLUE CROSS/BLUE SHIELD | Admitting: Family

## 2017-06-21 ENCOUNTER — Ambulatory Visit: Payer: Self-pay | Admitting: Cardiology

## 2017-07-03 ENCOUNTER — Telehealth: Payer: Self-pay | Admitting: Cardiology

## 2017-07-03 NOTE — Telephone Encounter (Signed)
Needs to know when he started certain medications

## 2017-07-03 NOTE — Telephone Encounter (Signed)
Patient called wanting to confirm date he started aldactone. Advised patient it was started on 04/01/15. Patient verbalized understanding.

## 2017-07-14 ENCOUNTER — Ambulatory Visit (INDEPENDENT_AMBULATORY_CARE_PROVIDER_SITE_OTHER): Payer: BLUE CROSS/BLUE SHIELD | Admitting: Family

## 2017-07-14 ENCOUNTER — Encounter: Payer: Self-pay | Admitting: Family

## 2017-07-14 VITALS — BP 139/96 | HR 90 | Temp 97.9°F | Ht 70.0 in | Wt 361.0 lb

## 2017-07-14 DIAGNOSIS — E1165 Type 2 diabetes mellitus with hyperglycemia: Secondary | ICD-10-CM

## 2017-07-14 DIAGNOSIS — Z951 Presence of aortocoronary bypass graft: Secondary | ICD-10-CM | POA: Diagnosis not present

## 2017-07-14 DIAGNOSIS — I2510 Atherosclerotic heart disease of native coronary artery without angina pectoris: Secondary | ICD-10-CM

## 2017-07-14 DIAGNOSIS — Z91199 Patient's noncompliance with other medical treatment and regimen due to unspecified reason: Secondary | ICD-10-CM | POA: Insufficient documentation

## 2017-07-14 DIAGNOSIS — M150 Primary generalized (osteo)arthritis: Secondary | ICD-10-CM | POA: Diagnosis not present

## 2017-07-14 DIAGNOSIS — F331 Major depressive disorder, recurrent, moderate: Secondary | ICD-10-CM | POA: Diagnosis not present

## 2017-07-14 DIAGNOSIS — M159 Polyosteoarthritis, unspecified: Secondary | ICD-10-CM

## 2017-07-14 DIAGNOSIS — E785 Hyperlipidemia, unspecified: Secondary | ICD-10-CM

## 2017-07-14 DIAGNOSIS — Z23 Encounter for immunization: Secondary | ICD-10-CM | POA: Diagnosis not present

## 2017-07-14 DIAGNOSIS — E118 Type 2 diabetes mellitus with unspecified complications: Secondary | ICD-10-CM | POA: Diagnosis not present

## 2017-07-14 DIAGNOSIS — Z9119 Patient's noncompliance with other medical treatment and regimen: Secondary | ICD-10-CM

## 2017-07-14 DIAGNOSIS — IMO0002 Reserved for concepts with insufficient information to code with codable children: Secondary | ICD-10-CM

## 2017-07-14 LAB — HEMOGLOBIN A1C: Hemoglobin A1C: 14

## 2017-07-14 LAB — BAYER DCA HB A1C WAIVED

## 2017-07-14 NOTE — Patient Instructions (Signed)
Diabetes Mellitus and Food It is important for you to manage your blood sugar (glucose) level. Your blood glucose level can be greatly affected by what you eat. Eating healthier foods in the appropriate amounts throughout the day at about the same time each day will help you control your blood glucose level. It can also help slow or prevent worsening of your diabetes mellitus. Healthy eating may even help you improve the level of your blood pressure and reach or maintain a healthy weight. General recommendations for healthful eating and cooking habits include:  Eating meals and snacks regularly. Avoid going long periods of time without eating to lose weight.  Eating a diet that consists mainly of plant-based foods, such as fruits, vegetables, nuts, legumes, and whole grains.  Using low-heat cooking methods, such as baking, instead of high-heat cooking methods, such as deep frying.  Work with your dietitian to make sure you understand how to use the Nutrition Facts information on food labels. How can food affect me? Carbohydrates Carbohydrates affect your blood glucose level more than any other type of food. Your dietitian will help you determine how many carbohydrates to eat at each meal and teach you how to count carbohydrates. Counting carbohydrates is important to keep your blood glucose at a healthy level, especially if you are using insulin or taking certain medicines for diabetes mellitus. Alcohol Alcohol can cause sudden decreases in blood glucose (hypoglycemia), especially if you use insulin or take certain medicines for diabetes mellitus. Hypoglycemia can be a life-threatening condition. Symptoms of hypoglycemia (sleepiness, dizziness, and disorientation) are similar to symptoms of having too much alcohol. If your health care provider has given you approval to drink alcohol, do so in moderation and use the following guidelines:  Women should not have more than one drink per day, and men  should not have more than two drinks per day. One drink is equal to: ? 12 oz of beer. ? 5 oz of wine. ? 1 oz of hard liquor.  Do not drink on an empty stomach.  Keep yourself hydrated. Have water, diet soda, or unsweetened iced tea.  Regular soda, juice, and other mixers might contain a lot of carbohydrates and should be counted.  What foods are not recommended? As you make food choices, it is important to remember that all foods are not the same. Some foods have fewer nutrients per serving than other foods, even though they might have the same number of calories or carbohydrates. It is difficult to get your body what it needs when you eat foods with fewer nutrients. Examples of foods that you should avoid that are high in calories and carbohydrates but low in nutrients include:  Trans fats (most processed foods list trans fats on the Nutrition Facts label).  Regular soda.  Juice.  Candy.  Sweets, such as cake, pie, doughnuts, and cookies.  Fried foods.  What foods can I eat? Eat nutrient-rich foods, which will nourish your body and keep you healthy. The food you should eat also will depend on several factors, including:  The calories you need.  The medicines you take.  Your weight.  Your blood glucose level.  Your blood pressure level.  Your cholesterol level.  You should eat a variety of foods, including:  Protein. ? Lean cuts of meat. ? Proteins low in saturated fats, such as fish, egg whites, and beans. Avoid processed meats.  Fruits and vegetables. ? Fruits and vegetables that may help control blood glucose levels, such as apples,   mangoes, and yams.  Dairy products. ? Choose fat-free or low-fat dairy products, such as milk, yogurt, and cheese.  Grains, bread, pasta, and rice. ? Choose whole grain products, such as multigrain bread, whole oats, and brown rice. These foods may help control blood pressure.  Fats. ? Foods containing healthful fats, such as  nuts, avocado, olive oil, canola oil, and fish.  Does everyone with diabetes mellitus have the same meal plan? Because every person with diabetes mellitus is different, there is not one meal plan that works for everyone. It is very important that you meet with a dietitian who will help you create a meal plan that is just right for you. This information is not intended to replace advice given to you by your health care provider. Make sure you discuss any questions you have with your health care provider. Document Released: 06/23/2005 Document Revised: 03/03/2016 Document Reviewed: 08/23/2013 [employer] Interactive Patient Education  2017 [employer]  

## 2017-07-14 NOTE — Progress Notes (Signed)
Subjective:    Patient ID: Mark Leach, male    DOB: Feb 14, 1971, 46 y.o.   MRN: [id number]  Pt presents to the office today for chronic follow up. Pt is followed by his Cardiologists every 3 month for CHF, HX of CABG, and HX of stroke and MI. Pt was placed on Entresto, but states this is expensive.  Pt states he is suppose to weight himself daily, but he is noncompliant.  Pt states he has only been taking his medications "some days'. His A1C was 9.2 his LDL was 143. Pt is noncompliant and states he can not afford his medications.  Diabetes  He presents for his follow-up diabetic visit. He has type 2 diabetes mellitus. His disease course has been worsening. Pertinent negatives for hypoglycemia include no nervousness/anxiousness. Associated symptoms include fatigue. Pertinent negatives for diabetes include no blurred vision, no foot paresthesias and no visual change. There are no hypoglycemic complications. Symptoms are stable. Diabetic complications include heart disease. Pertinent negatives for diabetic complications include no CVA or peripheral neuropathy. Risk factors for coronary artery disease include diabetes mellitus, dyslipidemia, obesity, male sex, hypertension and sedentary lifestyle. He is following a generally unhealthy diet. His breakfast blood glucose range is generally >200 mg/dl. (Does not check BS regularly) Eye exam is current.  Hypertension  This is a chronic problem. The current episode started more than 1 year ago. The problem has been waxing and waning since onset. The problem is uncontrolled. Associated symptoms include anxiety and peripheral edema. Pertinent negatives include no blurred vision, malaise/fatigue or shortness of breath. Risk factors for coronary artery disease include diabetes mellitus, dyslipidemia, obesity, male gender, sedentary lifestyle and family history. Hypertensive end-organ damage includes CAD/MI and heart failure. There is no history of CVA.    Hyperlipidemia  This is a chronic problem. The current episode started more than 1 year ago. The problem is uncontrolled. Recent lipid tests were reviewed and are high. Exacerbating diseases include obesity. Pertinent negatives include no shortness of breath. Current antihyperlipidemic treatment includes statins. The current treatment provides mild improvement of lipids. Compliance problems include adherence to diet.  Risk factors for coronary artery disease include dyslipidemia, diabetes mellitus, family history, male sex, hypertension, obesity and post-menopausal.  Arthritis  Presents for follow-up visit. He complains of pain and stiffness. The symptoms have been stable. Affected locations include the right knee, left knee and left elbow. His pain is at a severity of 9/10. Associated symptoms include fatigue.  Depression         This is a chronic problem.  The current episode started more than 1 year ago.   The onset quality is gradual.   The problem occurs intermittently.  The problem has been waxing and waning since onset.  Associated symptoms include fatigue, helplessness, hopelessness, irritable and sad.  Compliance with treatment is good and poor.  Past medical history includes anxiety.   Anxiety  Presents for follow-up visit. Symptoms include depressed mood. Patient reports no excessive worry, irritability, nervous/anxious behavior or shortness of breath. Symptoms occur occasionally.    Congestive Heart Failure  Presents for follow-up visit. Associated symptoms include edema and fatigue. Pertinent negatives include no orthopnea or shortness of breath. The symptoms have been stable.      Review of Systems  Constitutional: Positive for fatigue. Negative for irritability and malaise/fatigue.  Eyes: Negative for blurred vision.  Respiratory: Negative for shortness of breath.   Cardiovascular: Positive for leg swelling.  Musculoskeletal: Positive for arthralgias, arthritis and stiffness.  Psychiatric/Behavioral: Positive for depression. The patient is not nervous/anxious.   All other systems reviewed and are negative.      Objective:   Physical Exam  Constitutional: He is oriented to person, place, and time. He appears well-developed and well-nourished. He is irritable. No distress.  HENT:  Head: Normocephalic.  Right Ear: External ear normal.  Left Ear: External ear normal.  Mouth/Throat: Oropharynx is clear and moist.  Eyes: Pupils are equal, round, and reactive to light. Right eye exhibits no discharge. Left eye exhibits no discharge.  Neck: Normal range of motion. Neck supple. No thyromegaly present.  Cardiovascular: Normal rate, regular rhythm, normal heart sounds and intact distal pulses.   No murmur heard. Pulmonary/Chest: Effort normal and breath sounds normal. No respiratory distress. He has no wheezes.  Abdominal: Soft. Bowel sounds are normal. He exhibits no distension. There is no tenderness.  Musculoskeletal: Normal range of motion. He exhibits edema (3+ in BLE, discoloration present). He exhibits no tenderness.  Neurological: He is alert and oriented to person, place, and time.  Skin: Skin is warm and dry. No rash noted. No erythema.  Psychiatric: He has a normal mood and affect. His behavior is normal. Judgment and thought content normal.  Vitals reviewed.   BP (!) 139/96   Pulse 90   Temp 97.9 F (36.6 C) (Oral)   Ht _0  (1.778 m)   Wt (!) 361 lb (163.7 kg)   BMI 51.80 kg/m      Assessment & Plan:  1. Primary osteoarthritis involving multiple joints - CMP14+EGFR  2. S/P CABG x 4 - CMP14+EGFR  3. Moderate episode of recurrent major depressive disorder (HCC) - CMP14+EGFR  4. Coronary artery disease involving native coronary artery of native heart without angina pectoris  - CMP14+EGFR  5. Diabetes mellitus type 2, uncontrolled, with complications ([place]) Coupon card given for victoza - CMP14+EGFR - Bayer [place] Hb A1c Waived -  Ambulatory referral to Endocrinology  6. Hyperlipidemia, unspecified hyperlipidemia type - CMP14+EGFR - Lipid panel  7. Morbid obesity ([name])  - CMP14+EGFR  8. Noncompliance - CMP14+EGFR - Ambulatory referral to Endocrinology   Continue all meds Labs pending Health Maintenance reviewed Diet and exercise encouraged RTO 3 months   Evelina Dun, FNP

## 2017-07-15 LAB — CMP14+EGFR
A/G RATIO: 1.4 (ref 1.2–2.2)
ALT: 21 IU/L (ref 0–44)
AST: 17 IU/L (ref 0–40)
Albumin: 4.2 g/dL (ref 3.5–5.5)
Alkaline Phosphatase: 159 IU/L — ABNORMAL HIGH (ref 39–117)
BUN/Creatinine Ratio: 17 (ref 9–20)
BUN: 20 mg/dL (ref 6–24)
Bilirubin Total: 0.8 mg/dL (ref 0.0–1.2)
CALCIUM: 9.4 mg/dL (ref 8.7–10.2)
CO2: 22 mmol/L (ref 20–29)
Chloride: 97 mmol/L (ref 96–106)
Creatinine, Ser: 1.17 mg/dL (ref 0.76–1.27)
GFR, EST AFRICAN AMERICAN: 87 mL/min/{1.73_m2} (ref >59)
GFR, EST NON AFRICAN AMERICAN: 75 mL/min/{1.73_m2} (ref >59)
GLOBULIN, TOTAL: 3.1 g/dL (ref 1.5–4.5)
Glucose: 316 mg/dL — ABNORMAL HIGH (ref 65–99)
POTASSIUM: 5.1 mmol/L (ref 3.5–5.2)
Sodium: 137 mmol/L (ref 134–144)
TOTAL PROTEIN: 7.3 g/dL (ref 6.0–8.5)

## 2017-07-15 LAB — LIPID PANEL
CHOL/HDL RATIO: 5.6 ratio — AB (ref 0.0–5.0)
Cholesterol, Total: 145 mg/dL (ref 100–199)
HDL: 26 mg/dL — AB (ref >39)
LDL CALC: 95 mg/dL (ref 0–99)
Triglycerides: 122 mg/dL (ref 0–149)
VLDL Cholesterol Cal: 24 mg/dL (ref 5–40)

## 2017-08-03 ENCOUNTER — Ambulatory Visit: Payer: Self-pay | Admitting: Cardiology

## 2017-08-14 ENCOUNTER — Ambulatory Visit: Payer: BLUE CROSS/BLUE SHIELD | Admitting: "Endocrinology

## 2017-08-23 ENCOUNTER — Other Ambulatory Visit: Payer: Self-pay | Admitting: Cardiology

## 2017-08-23 ENCOUNTER — Other Ambulatory Visit: Payer: Self-pay | Admitting: Family

## 2017-09-11 ENCOUNTER — Other Ambulatory Visit: Payer: Self-pay | Admitting: Internal Medicine

## 2017-09-11 DIAGNOSIS — G4733 Obstructive sleep apnea (adult) (pediatric): Secondary | ICD-10-CM

## 2017-09-12 ENCOUNTER — Ambulatory Visit: Payer: BLUE CROSS/BLUE SHIELD | Admitting: Cardiology

## 2017-09-12 ENCOUNTER — Encounter: Payer: Self-pay | Admitting: Cardiology

## 2017-09-12 VITALS — BP 148/92 | HR 101 | Ht 71.0 in | Wt 364.4 lb

## 2017-09-12 DIAGNOSIS — I5022 Chronic systolic (congestive) heart failure: Secondary | ICD-10-CM | POA: Diagnosis not present

## 2017-09-12 DIAGNOSIS — G4730 Sleep apnea, unspecified: Secondary | ICD-10-CM

## 2017-09-12 DIAGNOSIS — I2510 Atherosclerotic heart disease of native coronary artery without angina pectoris: Secondary | ICD-10-CM | POA: Diagnosis not present

## 2017-09-12 MED ORDER — LOSARTAN POTASSIUM 100 MG PO TABS: 100.0000 mg | ORAL_TABLET | Freq: Every day | ORAL | 1 refills | Status: DC

## 2017-09-12 NOTE — Progress Notes (Signed)
Clinical Summary Mr. Debroah Looprnold is a 46 y.o.male  seen today for follow up of the following medical problems.   1. CAD/Chronic systolic HF/ICM - [facility], received stent to LAD - admit Jan 2016 with MI, cath severe CAD as described below with LVEF by LV gram 25% referred for CABG - s/p CABG Jan 2016 (LIMA-LAD, SVG-diag, SVG-PDA,SVG-OM) - 02/2015 echo: LVEF 30-35% - seen by EP 02/2015 for ICD evaluation, patient requested to hold off at that time.    - still with some swelling at times, comes and goes. No orthopnea/SOB/DOE - takes medicines about 3-4/7 days per week. - not doing daily weights.   2. OSA - compliant with cpap - has not followed up with pulmonary in quite some time.    Past Medical History:  Diagnosis Date  . Chronic systolic CHF (congestive heart failure) (HCC)   . Coronary artery disease 01/2013   anterior MI with cath showing 95% pLAD, 90% diag, 30% OM1, 70% mRCA, 60% dRCA s/p PCI with DES to LAD and diag and EF 40%  . Diabetes mellitus type 2, uncontrolled, with complications (HCC)   . DJD (degenerative joint disease)    knees  . Hypertension   . Ischemic dilated cardiomyopathy (HCC)   . Morbid obesity (HCC)   . 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 Stroke [facility](HCC) 10/12/2014   Patient with several month h/o left sided visual field deficit and MRI of brain revealing:  1. Medial right occipital lobe subacute infarct. 2. At least 3 punctate areas of acute nonhemorrhagic infarct are present involving the left thalamus, high posterior right frontal lobe and high a medial posterior left frontal or parietal lobe. 3. Additional white matter changes are noted beyond the areas of  . Visual field defect 10/12/2014     Allergies  Allergen Reactions  . Nitroglycerin Other (See Comments)    Hypotension, syncope, bradycardia     Current Outpatient Medications    Medication Sig Dispense Refill  . amLODipine (NORVASC) 5 MG tablet TAKE 1 TABLET ONCE DAILY. 30 tablet 5  . aspirin 81 MG tablet Take 81 mg by mouth daily.    Marland Kitchen. atorvastatin (LIPITOR) 80 MG tablet TAKE 1 TABLET DAILY AT 6 PM. 30 tablet 3  . citalopram (CELEXA) 40 MG tablet TAKE ONE TABLET BY MOUTH AT BEDTIME 30 tablet 1  . ENTRESTO 97-103 MG TAKE (1) TABLET TWICE DAILY. 60 tablet 0  . GLOBAL EASE INJECT PEN NEEDLES 31G X 8 MM MISC USE TO ADMINISTER INSULIN AT BEDTIME. 50 each 1  . glucose blood (BAYER CONTOUR NEXT TEST) test strip Use to check BG up to TID. 100 each 4  . Insulin Admin Supplies MISC   98  . Insulin Syringes, Disposable, U-100 1 ML MISC Use once daily 60 each 11  . LEVEMIR FLEXTOUCH 100 UNIT/ML Pen INJECT 50 UNITS SUBCUTANEOUSLY ONCE A DAY AT 10PM. 15 mL 0  . Magnesium Oxide 400 (240 Mg) MG TABS TAKE (1) TABLET BY MOUTH ONCE DAILY. 30 tablet 6  . metFORMIN (GLUCOPHAGE) 1000 MG tablet Take 1 tablet (1,000 mg total) by mouth 2 (two) times daily with a meal. 180 tablet 1  . metFORMIN (GLUCOPHAGE) 500 MG tablet TAKE 1 TABLET BY MOUTH TWICE DAILY WITH FOOD. 60 tablet 0  . metoprolol (TOPROL-XL) 200 MG 24 hr tablet  TAKE (1) TABLET BY MOUTH ONCE DAILY. 30 tablet 6  . Misc Natural Products (BLACK CHERRY CONCENTRATE PO) Take 1 tablet by mouth daily as needed (gout flare-up).     . naftifine (NAFTIN) 1 % cream Apply topically daily. 30 g 0  . spironolactone (ALDACTONE) 25 MG tablet TAKE (1/2) TABLET BY MOUTH DAILY. 15 tablet 0  . torsemide (DEMADEX) 20 MG tablet TAKE 2 TABLETS BY MOUTH TWICE DAILY. 120 tablet 0  . traMADol (ULTRAM) 50 MG tablet Take 1-2 tablets (50-100 mg total) by mouth every 12 (twelve) hours as needed. 90 tablet 3  . VICTOZA 18 MG/3ML SOPN INJECT 0.2ML (1.2MG ) INTO THE SKIN DAILY 6 mL 2   No current facility-administered medications for this visit.      Past Surgical History:  Procedure Laterality Date  . CARDIAC CATHETERIZATION  01/2013   [facility] in [place]   . CHOLECYSTECTOMY    . CORONARY ANGIOPLASTY  01/2013   PCI with stenting of LAD  . CORONARY ARTERY BYPASS GRAFT N/A 10/13/2014   Procedure: CORONARY ARTERY BYPASS GRAFTING (CABG), ON PUMP, TIMES FOUR, USING LEFT INTERNAL MAMMARY ARTERY, BILATERAL GREATER SAPHENOUS VEINS HARVESTED ENDOSCOPICALLY;  Surgeon: Purcell Nails, MD;  Location: [facility] OR;  Service: Open Heart Surgery;  Laterality: N/A;  . LEFT HEART CATH N/A 10/10/2014   Procedure: LEFT HEART CATH;  Surgeon: Lennette Bihari, MD;  Location: [facility] CATH LAB;  Service: Cardiovascular;  Laterality: N/A;  . MULTIPLE EXTRACTIONS WITH ALVEOLOPLASTY N/A 10/22/2014   Procedure: Extraction of tooth #'s 1,3,4,5,6,7,8,9,10,11,12,13,14,15,16,20,21,22,24, 25,26,27,28,29,30,32 with alveoloplasty.;  Surgeon: Charlynne Pander, DDS;  Location: [facility] OR;  Service: Oral Surgery;  Laterality: N/A;     Allergies  Allergen Reactions  . Nitroglycerin Other (See Comments)    Hypotension, syncope, bradycardia      Family History  Problem Relation Age of Onset  . Arthritis Mother   . Asthma Mother   . Heart disease Maternal Uncle   . Heart disease Maternal Grandfather   . Alcohol abuse Maternal Grandfather   . Heart attack Brother 37  . Diabetes Brother      Social History Mr. Blanford reports that he quit smoking about 9 years ago. His smoking use included cigarettes. He started smoking about 27 years ago. He has a 18.00 pack-year smoking history. he has never used smokeless tobacco. Mr. Patel reports that he does not drink alcohol.   Review of Systems CONSTITUTIONAL: No weight loss, fever, chills, weakness or fatigue.  HEENT: Eyes: No visual loss, blurred vision, double vision or yellow sclerae.No hearing loss, sneezing, congestion, runny nose or sore throat.  SKIN: No rash or itching.  CARDIOVASCULAR: per hpi RESPIRATORY:per hpi GASTROINTESTINAL: No anorexia, nausea, vomiting or diarrhea. No abdominal pain or blood.  GENITOURINARY: No burning on  urination, no polyuria NEUROLOGICAL: No headache, dizziness, syncope, paralysis, ataxia, numbness or tingling in the extremities. No change in bowel or bladder control.  MUSCULOSKELETAL: No muscle, back pain, joint pain or stiffness.  LYMPHATICS: No enlarged nodes. No history of splenectomy.  PSYCHIATRIC: No history of depression or anxiety.  ENDOCRINOLOGIC: No reports of sweating, cold or heat intolerance. No polyuria or polydipsia.  Marland Kitchen   Physical Examination Vitals:   09/12/17 1620  BP: (!) 148/92  Pulse: (!) 101  SpO2: 98%   Vitals:   09/12/17 1620  Weight: (!) 364 lb 6.4 oz (165.3 kg)  Height: 5\' 11"  (1.803 m)     Gen: resting comfortably, no acute distress HEENT: no scleral icterus, pupils equal  round and reactive, no palptable cervical adenopathy,  CV: RRR, no m/r/,g no jvd Resp: Clear to auscultation bilaterally GI: abdomen is soft, non-tender, non-distended, normal bowel sounds, no hepatosplenomegaly MSK: extremities are warm, 1+ bilateral LE edema Skin: warm, no rash Neuro:  no focal deficits Psych: appropriate affect   Diagnostic Studies  2016 Cath ANGIOGRAPHY:  Left main: Moderate size vessel which trifurcated into an LAD and intermediate vessel and left circumflex coronary artery.  LAD: The LAD was subtotally occluded at its ostium and there was diffuse 95-99% ostial proximal stenosis and then was totally occluded in the region of the first diagonal Shanah Guimaraes. There was a gap and then faint filling of a second diagonal Dariana Garbett which had a stent and there was faint filling of the LAD beyond the diagonal vessel via collaterals.  [place] Intermediate: Small caliber vessel free of significant disease.  Left circumflex: Large caliber vessel that gave rise to 2 major marginal branches and then in the posterior lateral like coronary artery. The first marginal Missi Mcmackin was moderate size and had proximal 90% followed by 80% stenoses. A distal superior Dilpreet Faires had 95%  stenosis.  Right coronary artery: Moderate size vessel that had 95% mid stenosis and 80% distal stenosis in the region of the acute margin. The vessel supplied the PDA. There were septal collaterals to the LAD.   Left ventriculography revealed dilated ventricle with an ejection fraction of 25% with diffuse global hypokinesis   IMPRESSION:  Severe ischemic dilated cardiomyopathy with an ejection fraction approximately 25%.  Severe multivessel coronary obstructive disease with diffusely diseased subtotal occlusion of the ostial and proximal LAD with faint collaterals to the first and second diagonal vessel and more distal LAD; left circumflex coronary artery with tandem 90 and 80% obtuse marginal 1 stenosis with distal 95% stenosis in this distal superior Glanda Spanbauer of this marginal vessel; and 95% mid RCA stenosis with 80% stenosis in the region of the acute margin with evidence for septal collaterals from the PDA to the LAD.  RECOMMENDATION:  The patient has surgical anatomy and CABG revascularization surgery will be recommended with surgical consultation in the morning. The patient was started on Aggrastat post procedure to reduce likelihood of progressive thrombosis. An attempt was made to initiate low-dose IV nitroglycerin, but the patient transiently dropped his blood pressure and this was discontinued.  10/2014 TEE Study Conclusions  - Left ventricle: Septal apical and anterior wall hypokinesis The cavity size was severely dilated. Systolic function was severely reduced. The estimated ejection fraction was in the range of 25% to 30%. - Aortic valve: There was trivial regurgitation. - Left atrium: The atrium was mildly dilated. - Atrial septum: No defect or patent foramen ovale was identified. - Pericardium, extracardiac: Small pericardial effusion IVC flat no evidence of tamponade.    02/2015 Echo Study Conclusions  - Left ventricle: The cavity size was  mildly dilated. There was mild concentric hypertrophy. Systolic function was moderately to severely reduced. The estimated ejection fraction was in the range of 30% to 35%. Contrast enhancement utilized. Doppler parameters are consistent with abnormal left ventricular relaxation (grade 1 diastolic dysfunction). Doppler parameters are consistent with high ventricular filling pressure. - Regional wall motion abnormality: Akinesis of the mid anterior, basal-mid anteroseptal, apical septal, and apical myocardium; severe hypokinesis of the mid inferoseptal myocardium; moderate hypokinesis of the apical lateral myocardium; mild hypokinesis of the mid anterolateral myocardium. - Aortic valve: Mildly to moderately calcified annulus. - Aorta: Mild to moderate aortic root dilatation. Aortic root dimension:  44 mm (ED). - Mitral valve: Mildly calcified annulus. Mildly thickened leaflets . - Left atrium: The atrium was mildly dilated. Volume/bsa, ES, (1-plane Simpson&'s, A2C): 33.2 ml/m^2. - Right ventricle: Systolic function was mildly reduced.  5/3/1/7 Clinic EKG (performed and reviewed in clinic) Sinus tach   Assessment and Plan   1. CAD/ Chronic systolic HF/ICM - previous MI, found to have severe multivessel disease, s/p 4 vessel CABG Jan 2016. Echo 02/2015 LVEF 30-35% - he has elected to hold off on ICD for now - poor compliance with meds  - encouraged again for strict medication compliance - entresto is too expensive, we will change back to losartan 100mg  daily.   2. OSA - reestablish with pulmonary, we will place referral  F/u 4 months   Antoine Poche, M.D.

## 2017-09-12 NOTE — Patient Instructions (Signed)
Your physician recommends that you schedule a follow-up appointment in: 4 MONTHS [facility]  Your physician has recommended you make the following change in your medication:   STOP ENTRESTO   START LOSARTAN 100 MG DAILY   [facility]   [facility]!!

## 2017-09-15 ENCOUNTER — Encounter: Payer: Self-pay | Admitting: Cardiology

## 2017-10-23 ENCOUNTER — Ambulatory Visit: Payer: BLUE CROSS/BLUE SHIELD | Admitting: Family

## 2017-11-06 ENCOUNTER — Ambulatory Visit: Payer: BLUE CROSS/BLUE SHIELD | Admitting: Family

## 2017-11-06 ENCOUNTER — Encounter: Payer: Self-pay | Admitting: Family

## 2017-11-06 VITALS — BP 146/89 | HR 98 | Temp 97.4°F | Ht 71.0 in | Wt 355.2 lb

## 2017-11-06 DIAGNOSIS — I5043 Acute on chronic combined systolic (congestive) and diastolic (congestive) heart failure: Secondary | ICD-10-CM

## 2017-11-06 DIAGNOSIS — Z951 Presence of aortocoronary bypass graft: Secondary | ICD-10-CM

## 2017-11-06 DIAGNOSIS — E785 Hyperlipidemia, unspecified: Secondary | ICD-10-CM | POA: Diagnosis not present

## 2017-11-06 DIAGNOSIS — E1169 Type 2 diabetes mellitus with other specified complication: Secondary | ICD-10-CM

## 2017-11-06 DIAGNOSIS — Z9119 Patient's noncompliance with other medical treatment and regimen: Secondary | ICD-10-CM

## 2017-11-06 DIAGNOSIS — E1159 Type 2 diabetes mellitus with other circulatory complications: Secondary | ICD-10-CM

## 2017-11-06 DIAGNOSIS — E118 Type 2 diabetes mellitus with unspecified complications: Secondary | ICD-10-CM | POA: Diagnosis not present

## 2017-11-06 DIAGNOSIS — E1165 Type 2 diabetes mellitus with hyperglycemia: Secondary | ICD-10-CM

## 2017-11-06 DIAGNOSIS — I2510 Atherosclerotic heart disease of native coronary artery without angina pectoris: Secondary | ICD-10-CM | POA: Diagnosis not present

## 2017-11-06 DIAGNOSIS — M150 Primary generalized (osteo)arthritis: Secondary | ICD-10-CM

## 2017-11-06 DIAGNOSIS — M159 Polyosteoarthritis, unspecified: Secondary | ICD-10-CM

## 2017-11-06 DIAGNOSIS — I10 Essential (primary) hypertension: Secondary | ICD-10-CM | POA: Diagnosis not present

## 2017-11-06 DIAGNOSIS — IMO0002 Reserved for concepts with insufficient information to code with codable children: Secondary | ICD-10-CM

## 2017-11-06 DIAGNOSIS — Z91199 Patient's noncompliance with other medical treatment and regimen due to unspecified reason: Secondary | ICD-10-CM

## 2017-11-06 LAB — HEMOGLOBIN A1C: Hemoglobin A1C: 14

## 2017-11-06 LAB — BAYER DCA HB A1C WAIVED

## 2017-11-06 NOTE — Progress Notes (Signed)
Subjective:    Patient ID: [name], male    DOB: November 01, 1970, 47 y.o.   MRN: [id number]  Pt presents to the office today for chronic follow up. Pt is followed by his Cardiologists every 3 month for CHF, HX of CABG, and HX of stroke and MI. Pt states he is suppose to weight himself daily, but he is noncompliant.  Pt states he has only been taking his medications "some days'. His A1C was 9.2 his LDL was 95. Pt is noncompliant and states he can not afford his medications.  Diabetes  He presents for his follow-up diabetic visit. He has type 2 diabetes mellitus. His disease course has been worsening. There are no hypoglycemic associated symptoms. Pertinent negatives for diabetes include no blurred vision, no foot paresthesias, no foot ulcerations and no visual change. Symptoms are stable. Diabetic complications include a CVA, heart disease and nephropathy. Pertinent negatives for diabetic complications include no peripheral neuropathy. Risk factors for coronary artery disease include dyslipidemia, diabetes mellitus, family history, obesity, male sex, hypertension and sedentary lifestyle. He is following a generally unhealthy diet. His breakfast blood glucose range is generally 130-140 mg/dl. Eye exam is not current.  Hypertension  This is a chronic problem. The current episode started more than 1 year ago. The problem has been waxing and waning since onset. The problem is uncontrolled. Associated symptoms include malaise/fatigue and peripheral edema. Pertinent negatives include no blurred vision or shortness of breath. Risk factors for coronary artery disease include diabetes mellitus, dyslipidemia, family history and obesity. The current treatment provides mild improvement. Hypertensive end-organ damage includes CAD/MI and CVA. There is no history of kidney disease.  Hyperlipidemia  This is a chronic problem. The current episode started more than 1 year ago. The problem is uncontrolled. Recent  lipid tests were reviewed and are high. Exacerbating diseases include obesity. Pertinent negatives include no shortness of breath. Current antihyperlipidemic treatment includes statins. The current treatment provides mild improvement of lipids. Compliance problems include adherence to diet and adherence to exercise.  Risk factors for coronary artery disease include dyslipidemia, diabetes mellitus, obesity, hypertension, male sex and a sedentary lifestyle.  Arthritis  Presents for follow-up visit. He complains of pain, stiffness and joint warmth. The symptoms have been stable. Affected locations include the right elbow. His pain is at a severity of 8/10.      Review of Systems  Constitutional: Positive for malaise/fatigue.  Eyes: Negative for blurred vision.  Respiratory: Negative for shortness of breath.   Musculoskeletal: Positive for arthralgias, arthritis and stiffness.  All other systems reviewed and are negative.      Objective:   Physical Exam  Constitutional: He is oriented to person, place, and time. He appears well-developed and well-nourished. No distress.  Morbid obese  HENT:  Head: Normocephalic.  Right Ear: External ear normal.  Left Ear: External ear normal.  Mouth/Throat: Oropharynx is clear and moist.  Eyes: Pupils are equal, round, and reactive to light. Right eye exhibits no discharge. Left eye exhibits no discharge.  Neck: Normal range of motion. Neck supple. No thyromegaly present.  Cardiovascular: Normal rate, regular rhythm, normal heart sounds and intact distal pulses.  No murmur heard. Pulmonary/Chest: Effort normal and breath sounds normal. No respiratory distress. He has no wheezes.  Abdominal: Soft. Bowel sounds are normal. He exhibits no distension. There is no tenderness.  Musculoskeletal: Normal range of motion. He exhibits edema (3+ BLE). He exhibits no tenderness.  Neurological: He is alert and oriented to person,  place, and time.  Skin: Skin is warm  and dry. No rash noted. No erythema.  Psychiatric: He has a normal mood and affect. His behavior is normal. Judgment and thought content normal.  Vitals reviewed.    BP (!) 146/89   Pulse 98   Temp (!) 97.4 F (36.3 C) (Oral)   Ht '5\' 11"'$  (1.803 m)   Wt (!) 355 lb 3.2 oz (161.1 kg)   BMI 49.54 kg/m      Assessment & Plan:  1. Diabetes mellitus type 2, uncontrolled, with complications ([place]) - Bayer DCA Hb A1c Waived - CMP14+EGFR   2. Hypertension associated with diabetes ([place]) Will not add medications at this time, since patient had not been taking his medications regularly. Discussed importance of taking everyday - CMP14+EGFR  3. Primary osteoarthritis involving multiple joints - CMP14+EGFR  4. Noncompliance - CMP14+EGFR  5. Morbid obesity ([place]) - CMP14+EGFR  6. Coronary artery disease involving native coronary artery of native heart without angina pectoris - CMP14+EGFR - Lipid panel  7. Acute on chronic combined systolic and diastolic heart failure (HCC) - CMP14+EGFR  8. S/P CABG x 4 - CMP14+EGFR  9. Hyperlipidemia associated with type 2 diabetes mellitus ([place]) - CMP14+EGFR - Lipid panel   Continue all meds & keep follow up with Cardiologists  Labs pending Health Maintenance reviewed Diet and exercise encouraged RTO 3 months   Evelina Dun, FNP

## 2017-11-06 NOTE — Patient Instructions (Signed)

## 2017-11-07 ENCOUNTER — Other Ambulatory Visit: Payer: Self-pay | Admitting: Family

## 2017-11-07 DIAGNOSIS — E118 Type 2 diabetes mellitus with unspecified complications: Principal | ICD-10-CM

## 2017-11-07 DIAGNOSIS — Z91199 Patient's noncompliance with other medical treatment and regimen due to unspecified reason: Secondary | ICD-10-CM

## 2017-11-07 DIAGNOSIS — Z9119 Patient's noncompliance with other medical treatment and regimen: Secondary | ICD-10-CM

## 2017-11-07 DIAGNOSIS — IMO0002 Reserved for concepts with insufficient information to code with codable children: Secondary | ICD-10-CM

## 2017-11-07 DIAGNOSIS — E1165 Type 2 diabetes mellitus with hyperglycemia: Secondary | ICD-10-CM

## 2017-11-07 LAB — LIPID PANEL
CHOL/HDL RATIO: 4.9 ratio (ref 0.0–5.0)
Cholesterol, Total: 156 mg/dL (ref 100–199)
HDL: 32 mg/dL — AB (ref >39)
LDL Calculated: 95 mg/dL (ref 0–99)
Triglycerides: 146 mg/dL (ref 0–149)
VLDL Cholesterol Cal: 29 mg/dL (ref 5–40)

## 2017-11-07 LAB — CMP14+EGFR
A/G RATIO: 1.3 (ref 1.2–2.2)
ALT: 39 IU/L (ref 0–44)
AST: 31 IU/L (ref 0–40)
Albumin: 4.1 g/dL (ref 3.5–5.5)
Alkaline Phosphatase: 213 IU/L — ABNORMAL HIGH (ref 39–117)
BILIRUBIN TOTAL: 1 mg/dL (ref 0.0–1.2)
BUN / CREAT RATIO: 15 (ref 9–20)
BUN: 15 mg/dL (ref 6–24)
CALCIUM: 9.3 mg/dL (ref 8.7–10.2)
CHLORIDE: 95 mmol/L — AB (ref 96–106)
CO2: 26 mmol/L (ref 20–29)
Creatinine, Ser: 1.02 mg/dL (ref 0.76–1.27)
GFR calc Af Amer: 102 mL/min/{1.73_m2} (ref >59)
GFR, EST NON AFRICAN AMERICAN: 88 mL/min/{1.73_m2} (ref >59)
GLOBULIN, TOTAL: 3.1 g/dL (ref 1.5–4.5)
Glucose: 346 mg/dL — ABNORMAL HIGH (ref 65–99)
POTASSIUM: 5.3 mmol/L — AB (ref 3.5–5.2)
Sodium: 138 mmol/L (ref 134–144)
TOTAL PROTEIN: 7.2 g/dL (ref 6.0–8.5)

## 2017-11-24 ENCOUNTER — Other Ambulatory Visit: Payer: Self-pay | Admitting: Family

## 2017-12-01 ENCOUNTER — Ambulatory Visit (INDEPENDENT_AMBULATORY_CARE_PROVIDER_SITE_OTHER): Payer: BLUE CROSS/BLUE SHIELD | Admitting: Physician Assistant

## 2017-12-01 ENCOUNTER — Encounter: Payer: Self-pay | Admitting: Physician Assistant

## 2017-12-01 DIAGNOSIS — J329 Chronic sinusitis, unspecified: Secondary | ICD-10-CM | POA: Insufficient documentation

## 2017-12-01 DIAGNOSIS — J0100 Acute maxillary sinusitis, unspecified: Secondary | ICD-10-CM | POA: Diagnosis not present

## 2017-12-01 MED ORDER — AMOXICILLIN-POT CLAVULANATE 875-125 MG PO TABS
1.0000 | ORAL_TABLET | Freq: Two times a day (BID) | ORAL | 0 refills | Status: DC
Start: 2017-12-01 — End: 2017-12-25

## 2017-12-01 NOTE — Patient Instructions (Signed)
In a few days you may receive a survey in the mail or online from [employer] regarding your visit with us today. Please take a moment to fill this out. Your feedback is very important to our whole office. It can help us better understand your needs as well as improve your experience and satisfaction. Thank you for taking your time to complete it. We care about you.  Anushka Hartinger, PA-C  

## 2017-12-04 NOTE — Progress Notes (Signed)
BP 130/80   Pulse 92   Temp 98.6 F (37 C) (Oral)   Resp 20   Ht 5\' 11"  (1.803 m)   Wt (!) 359 lb 3.2 oz (162.9 kg)   SpO2 97%   BMI 50.10 kg/m    Subjective:    Patient ID: Mark Leach, male    DOB: January 13, 1971, 47 y.o.   MRN: [id number]  HPI: Mark Leach is a 47 y.o. male presenting on 12/01/2017 for No chief complaint on file.  This patient has had many days of sinus headache and postnasal drainage. There is copious drainage at times. Denies any fever at this time. There has been a history of sinus infections in the past.  No history of sinus surgery. There is cough at night. It has become more prevalent in recent days.   Past Medical History:  Diagnosis Date  . Chronic systolic CHF (congestive heart failure) (HCC)   . Coronary artery disease 01/2013   anterior MI with cath showing 95% pLAD, 90% diag, 30% OM1, 70% mRCA, 60% dRCA s/p PCI with DES to LAD and diag and EF 40%  . Diabetes mellitus type 2, uncontrolled, with complications (HCC)   . DJD (degenerative joint disease)    knees  . Hypertension   . Ischemic dilated cardiomyopathy (HCC)   . Morbid obesity (HCC)   . 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 Stroke [facility]) 10/12/2014   Patient with several month h/o left sided visual field deficit and MRI of brain revealing:  1. Medial right occipital lobe subacute infarct. 2. At least 3 punctate areas of acute nonhemorrhagic infarct are present involving the left thalamus, high posterior right frontal lobe and high a medial posterior left frontal or parietal lobe. 3. Additional white matter changes are noted beyond the areas of  . Visual field defect 10/12/2014   Relevant past medical, surgical, family and social history reviewed and updated as indicated. Interim medical history since our last visit reviewed. Allergies and medications reviewed and updated. DATA REVIEWED: CHART IN EPIC  Family History reviewed for  pertinent findings.  Review of Systems  Constitutional: Positive for fatigue and fever. Negative for appetite change.  HENT: Positive for congestion, sinus pressure, sinus pain and sore throat.   Eyes: Negative.  Negative for pain and visual disturbance.  Respiratory: Positive for shortness of breath and wheezing. Negative for cough and chest tightness.   Cardiovascular: Negative.  Negative for chest pain, palpitations and leg swelling.  Gastrointestinal: Negative.  Negative for abdominal pain, diarrhea, nausea and vomiting.  Endocrine: Negative.   Genitourinary: Negative.   Musculoskeletal: Positive for back pain and myalgias.  Skin: Negative.  Negative for color change and rash.  Neurological: Positive for headaches. Negative for weakness and numbness.  Psychiatric/Behavioral: Negative.     Allergies as of 12/01/2017      Reactions   Nitroglycerin Other (See Comments)   Hypotension, syncope, bradycardia      Medication List        Accurate as of 12/01/17 11:59 PM. Always use your most recent med list.          amLODipine 5 MG tablet Commonly known as:  NORVASC TAKE 1 TABLET ONCE DAILY.   amoxicillin-clavulanate 875-125 MG tablet Commonly known as:  AUGMENTIN Take 1 tablet by mouth 2 (two) times daily.   aspirin 81 MG tablet Take 81 mg by  mouth daily.   atorvastatin 80 MG tablet Commonly known as:  LIPITOR TAKE 1 TABLET DAILY AT 6 PM.   BLACK CHERRY CONCENTRATE PO Take 1 tablet by mouth daily as needed (gout flare-up).   citalopram 40 MG tablet Commonly known as:  CELEXA TAKE ONE TABLET BY MOUTH AT BEDTIME   GLOBAL EASE INJECT PEN NEEDLES 31G X 8 MM Misc Generic drug:  Insulin Pen Needle USE TO ADMINISTER INSULIN AT BEDTIME.   glucose blood test strip Commonly known as:  BAYER CONTOUR NEXT TEST Use to check BG up to TID.   Insulin Admin Supplies Misc   Insulin Syringes (Disposable) U-100 1 ML Misc Use once daily   LEVEMIR FLEXTOUCH 100 UNIT/ML  Pen Generic drug:  Insulin Detemir INJECT 50 UNITS SUBCUTANEOUSLY ONCE A DAY AT 10PM.   losartan 100 MG tablet Commonly known as:  COZAAR Take 1 tablet (100 mg total) by mouth daily.   Magnesium Oxide 400 (240 Mg) MG Tabs TAKE (1) TABLET BY MOUTH ONCE DAILY.   metFORMIN 1000 MG tablet Commonly known as:  GLUCOPHAGE Take 1 tablet (1,000 mg total) by mouth 2 (two) times daily with a meal.   metoprolol 200 MG 24 hr tablet Commonly known as:  TOPROL-XL TAKE (1) TABLET BY MOUTH ONCE DAILY.   spironolactone 25 MG tablet Commonly known as:  ALDACTONE TAKE (1/2) TABLET BY MOUTH DAILY.   torsemide 20 MG tablet Commonly known as:  DEMADEX TAKE 2 TABLETS BY MOUTH TWICE DAILY.   traMADol 50 MG tablet Commonly known as:  ULTRAM Take 1-2 tablets (50-100 mg total) by mouth every 12 (twelve) hours as needed.   VICTOZA 18 MG/3ML Sopn Generic drug:  liraglutide INJECT 0.2ML (1.2MG ) INTO THE SKIN DAILY          Objective:    BP 130/80   Pulse 92   Temp 98.6 F (37 C) (Oral)   Resp 20   Ht 5\' 11"  (1.803 m)   Wt (!) 359 lb 3.2 oz (162.9 kg)   SpO2 97%   BMI 50.10 kg/m   Allergies  Allergen Reactions  . Nitroglycerin Other (See Comments)    Hypotension, syncope, bradycardia    Wt Readings from Last 3 Encounters:  12/01/17 (!) 359 lb 3.2 oz (162.9 kg)  11/06/17 (!) 355 lb 3.2 oz (161.1 kg)  09/12/17 (!) 364 lb 6.4 oz (165.3 kg)    Physical Exam  Constitutional: He is oriented to person, place, and time. He appears well-developed and well-nourished.  HENT:  Head: Normocephalic and atraumatic.  Right Ear: Tympanic membrane and external ear normal. No middle ear effusion.  Left Ear: Tympanic membrane and external ear normal.  No middle ear effusion.  Nose: Mucosal edema and rhinorrhea present. Right sinus exhibits no maxillary sinus tenderness. Left sinus exhibits no maxillary sinus tenderness.  Mouth/Throat: Uvula is midline. Posterior oropharyngeal erythema present.   Eyes: Conjunctivae and EOM are normal. Pupils are equal, round, and reactive to light. Right eye exhibits no discharge. Left eye exhibits no discharge.  Neck: Normal range of motion.  Cardiovascular: Normal rate, regular rhythm and normal heart sounds.  Pulmonary/Chest: Effort normal and breath sounds normal. No respiratory distress. He has no wheezes.  Abdominal: Soft.  Lymphadenopathy:    He has no cervical adenopathy.  Neurological: He is alert and oriented to person, place, and time.  Skin: Skin is warm and dry.  Psychiatric: He has a normal mood and affect.        Assessment &  Plan:   1. Acute non-recurrent maxillary sinusitis - amoxicillin-clavulanate (AUGMENTIN) 875-125 MG tablet; Take 1 tablet by mouth 2 (two) times daily.  Dispense: 20 tablet; Refill: 0   Continue all other maintenance medications as listed above.  Follow up plan: No Follow-up on file.  Educational handout given for sinusitis  Remus LofflerAngel S. Chantia Amalfitano PA-C [facility] [address]  [place], [place] [address] [phone]   12/04/2017, 9:50 PM

## 2017-12-06 ENCOUNTER — Ambulatory Visit: Payer: BLUE CROSS/BLUE SHIELD | Admitting: "Endocrinology

## 2017-12-11 ENCOUNTER — Other Ambulatory Visit: Payer: Self-pay | Admitting: *Deleted

## 2017-12-11 MED ORDER — METFORMIN HCL 1000 MG PO TABS: 1000.0000 mg | ORAL_TABLET | Freq: Two times a day (BID) | ORAL | 0 refills | Status: DC

## 2017-12-25 ENCOUNTER — Encounter: Payer: Self-pay | Admitting: "Endocrinology

## 2017-12-25 ENCOUNTER — Ambulatory Visit: Payer: BLUE CROSS/BLUE SHIELD | Admitting: "Endocrinology

## 2017-12-25 VITALS — BP 145/82 | HR 70 | Ht 71.0 in | Wt 358.0 lb

## 2017-12-25 DIAGNOSIS — E782 Mixed hyperlipidemia: Secondary | ICD-10-CM | POA: Insufficient documentation

## 2017-12-25 DIAGNOSIS — I10 Essential (primary) hypertension: Secondary | ICD-10-CM | POA: Diagnosis not present

## 2017-12-25 DIAGNOSIS — Z9119 Patient's noncompliance with other medical treatment and regimen: Secondary | ICD-10-CM | POA: Diagnosis not present

## 2017-12-25 DIAGNOSIS — E1159 Type 2 diabetes mellitus with other circulatory complications: Secondary | ICD-10-CM

## 2017-12-25 DIAGNOSIS — Z91199 Patient's noncompliance with other medical treatment and regimen due to unspecified reason: Secondary | ICD-10-CM

## 2017-12-25 MED ORDER — LIRAGLUTIDE 18 MG/3ML ~~LOC~~ SOPN: 1.8000 mg | PEN_INJECTOR | Freq: Every day | SUBCUTANEOUS | 3 refills | Status: DC

## 2017-12-25 MED ORDER — INSULIN DETEMIR 100 UNIT/ML FLEXPEN: PEN_INJECTOR | SUBCUTANEOUS | 0 refills | Status: DC

## 2017-12-25 NOTE — Progress Notes (Signed)
Endocrinology Consult Note       12/25/2017, 5:24 PM   Subjective:    Patient ID: Mark Leach, male    DOB: 06-11-1971.  Mark Leach is being seen in consultation for management of currently uncontrolled symptomatic diabetes requested by  Junie Spencer, FNP.   Past Medical History:  Diagnosis Date  . Chronic systolic CHF (congestive heart failure) (HCC)   . Coronary artery disease 01/2013   anterior MI with cath showing 95% pLAD, 90% diag, 30% OM1, 70% mRCA, 60% dRCA s/p PCI with DES to LAD and diag and EF 40%  . Diabetes mellitus type 2, uncontrolled, with complications (HCC)   . DJD (degenerative joint disease)    knees  . Hypertension   . Ischemic dilated cardiomyopathy (HCC)   . Morbid obesity (HCC)   . 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 Stroke [facility]) 10/12/2014   Patient with several month h/o left sided visual field deficit and MRI of brain revealing:  1. Medial right occipital lobe subacute infarct. 2. At least 3 punctate areas of acute nonhemorrhagic infarct are present involving the left thalamus, high posterior right frontal lobe and high a medial posterior left frontal or parietal lobe. 3. Additional white matter changes are noted beyond the areas of  . Visual field defect 10/12/2014   Past Surgical History:  Procedure Laterality Date  . CARDIAC CATHETERIZATION  01/2013   [facility] in [place]  . CHOLECYSTECTOMY    . CORONARY ANGIOPLASTY  01/2013   PCI with stenting of LAD  . CORONARY ARTERY BYPASS GRAFT N/A 10/13/2014   Procedure: CORONARY ARTERY BYPASS GRAFTING (CABG), ON PUMP, TIMES FOUR, USING LEFT INTERNAL MAMMARY ARTERY, BILATERAL GREATER SAPHENOUS VEINS HARVESTED ENDOSCOPICALLY;  Surgeon: Purcell Nails, MD;  Location: [facility] OR;  Service: Open Heart Surgery;  Laterality: N/A;  . LEFT HEART CATH N/A 10/10/2014   Procedure: LEFT  HEART CATH;  Surgeon: Lennette Bihari, MD;  Location: [facility] CATH LAB;  Service: Cardiovascular;  Laterality: N/A;  . MULTIPLE EXTRACTIONS WITH ALVEOLOPLASTY N/A 10/22/2014   Procedure: Extraction of tooth #'s 1,3,4,5,6,7,8,9,10,11,12,13,14,15,16,20,21,22,24, 25,26,27,28,29,30,32 with alveoloplasty.;  Surgeon: Charlynne Pander, DDS;  Location: [facility] OR;  Service: Oral Surgery;  Laterality: N/A;   Social History   Socioeconomic History  . Marital status: Married    Spouse name: None  . Number of children: None  . Years of education: None  . Highest education level: None  Social Needs  . Financial resource strain: None  . Food insecurity - worry: None  . Food insecurity - inability: None  . Transportation needs - medical: None  . Transportation needs - non-medical: None  Occupational History  . None  Tobacco Use  . Smoking status: Former Smoker    Packs/day: 1.00    Years: 18.00    Pack years: 18.00    Types: Cigarettes    Start date: 01/04/1990    Last attempt to quit: 10/12/2007    Years since quitting: 10.2  . Smokeless tobacco: Never Used  Substance and Sexual Activity  .  Alcohol use: No    Alcohol/week: 0.0 oz  . Drug use: No  . Sexual activity: Yes  Other Topics Concern  . None  Social History Narrative   Lives w/ wife and 46 yr old daughter in [place].  Unemployed   Outpatient Encounter Medications as of 12/25/2017  Medication Sig  . amLODipine (NORVASC) 5 MG tablet TAKE 1 TABLET ONCE DAILY.  Marland Kitchen. aspirin 81 MG tablet Take 81 mg by mouth daily.  Marland Kitchen. atorvastatin (LIPITOR) 80 MG tablet TAKE 1 TABLET DAILY AT 6 PM.  . citalopram (CELEXA) 40 MG tablet TAKE ONE TABLET BY MOUTH AT BEDTIME  . Insulin Detemir (LEVEMIR FLEXTOUCH) 100 UNIT/ML Pen INJECT 60 UNITS SUBCUTANEOUSLY ONCE A DAY AT 10PM.  . losartan (COZAAR) 100 MG tablet Take 100 mg by mouth daily.  . metFORMIN (GLUCOPHAGE) 1000 MG tablet Take 1 tablet (1,000 mg total) by mouth 2 (two) times daily with a meal.  . metoprolol  (TOPROL-XL) 200 MG 24 hr tablet TAKE (1) TABLET BY MOUTH ONCE DAILY.  Marland Kitchen. Misc Natural Products (BLACK CHERRY CONCENTRATE PO) Take 1 tablet by mouth daily as needed (gout flare-up).   Marland Kitchen. spironolactone (ALDACTONE) 25 MG tablet TAKE (1/2) TABLET BY MOUTH DAILY.  Marland Kitchen. torsemide (DEMADEX) 20 MG tablet TAKE 2 TABLETS BY MOUTH TWICE DAILY.  . traMADol (ULTRAM) 50 MG tablet Take 1-2 tablets (50-100 mg total) by mouth every 12 (twelve) hours as needed.  Marland Kitchen. VICTOZA 18 MG/3ML SOPN INJECT 0.2ML (1.2MG ) INTO THE SKIN DAILY  . [DISCONTINUED] LEVEMIR FLEXTOUCH 100 UNIT/ML Pen INJECT 50 UNITS SUBCUTANEOUSLY ONCE A DAY AT 10PM.  . losartan (COZAAR) 100 MG tablet Take 1 tablet (100 mg total) by mouth daily.  . Magnesium Oxide 400 (240 Mg) MG TABS TAKE (1) TABLET BY MOUTH ONCE DAILY.  . [DISCONTINUED] amoxicillin-clavulanate (AUGMENTIN) 875-125 MG tablet Take 1 tablet by mouth 2 (two) times daily.  . [DISCONTINUED] GLOBAL EASE INJECT PEN NEEDLES 31G X 8 MM MISC USE TO ADMINISTER INSULIN AT BEDTIME.  . [DISCONTINUED] glucose blood (BAYER CONTOUR NEXT TEST) test strip Use to check BG up to TID.  . [DISCONTINUED] Insulin Admin Supplies MISC   . [DISCONTINUED] Insulin Syringes, Disposable, U-100 1 ML MISC Use once daily   No facility-administered encounter medications on file as of 12/25/2017.     ALLERGIES: Allergies  Allergen Reactions  . Nitroglycerin Other (See Comments)    Hypotension, syncope, bradycardia    VACCINATION STATUS: Immunization History  Administered Date(s) Administered  . Influenza,inj,Quad PF,6+ Mos 08/05/2015, 09/12/2016, 07/14/2017  . Pneumococcal Conjugate-13 08/07/2015  . Tdap 02/22/2016    Diabetes  He presents for his initial diabetic visit. He has type 2 diabetes mellitus. Onset time: He was diagnosed at approximate age of 47. His disease course has been worsening. There are no hypoglycemic associated symptoms. Pertinent negatives for hypoglycemia include no confusion, headaches,  pallor or seizures. Associated symptoms include blurred vision, foot paresthesias, polydipsia and polyuria. Pertinent negatives for diabetes include no chest pain, no fatigue, no polyphagia and no weakness. There are no hypoglycemic complications. Symptoms are worsening. Diabetic complications include heart disease. Risk factors for coronary artery disease include dyslipidemia, family history, obesity, male sex, hypertension, sedentary lifestyle and tobacco exposure. Current diabetic treatment includes insulin injections and oral agent (monotherapy) (He is currently on Levemir 50 units nightly, Victoza 1.2 mg daily, and metformin 1000 mg p.o. twice daily.). His weight is fluctuating minimally. He is following a generally unhealthy diet. When asked about meal planning, he reported none.  He has not had a previous visit with a dietitian. He never participates in exercise. (Patient did not bring any meter nor logs to review today.  His recent A1c was greater than 14% on November 06, 2017.) An ACE inhibitor/angiotensin II receptor blocker is being taken. He does not see a podiatrist.Eye exam is not current.  Hyperlipidemia  This is a chronic problem. The current episode started more than 1 year ago. The problem is controlled. Exacerbating diseases include diabetes and obesity. Pertinent negatives include no chest pain, myalgias or shortness of breath. Current antihyperlipidemic treatment includes statins. Risk factors for coronary artery disease include diabetes mellitus, dyslipidemia, family history, obesity, male sex and a sedentary lifestyle.      Review of Systems  Constitutional: Negative for chills, fatigue, fever and unexpected weight change.  HENT: Negative for dental problem, mouth sores and trouble swallowing.   Eyes: Positive for blurred vision. Negative for visual disturbance.  Respiratory: Negative for cough, choking, chest tightness, shortness of breath and wheezing.   Cardiovascular: Negative  for chest pain, palpitations and leg swelling.  Gastrointestinal: Negative for abdominal distention, abdominal pain, constipation, diarrhea, nausea and vomiting.  Endocrine: Positive for polydipsia and polyuria. Negative for polyphagia.  Genitourinary: Negative for dysuria, flank pain, hematuria and urgency.  Musculoskeletal: Negative for back pain, myalgias and neck pain.  Skin: Negative for pallor, rash and wound.  Neurological: Negative for seizures, syncope, weakness, numbness and headaches.  Psychiatric/Behavioral: Negative for confusion and dysphoric mood.    Objective:    BP (!) 145/82   Pulse 70   Ht 5\' 11"  (1.803 m)   Wt (!) 358 lb (162.4 kg)   BMI 49.93 kg/m   Wt Readings from Last 3 Encounters:  12/25/17 (!) 358 lb (162.4 kg)  12/01/17 (!) 359 lb 3.2 oz (162.9 kg)  11/06/17 (!) 355 lb 3.2 oz (161.1 kg)     Physical Exam  Constitutional: He is oriented to person, place, and time. He appears well-developed. He is cooperative. No distress.  + Obese.  HENT:  Head: Normocephalic and atraumatic.  Eyes: EOM are normal.  Neck: Normal range of motion. Neck supple. No tracheal deviation present. No thyromegaly present.  Cardiovascular: Normal rate, S1 normal and S2 normal. Exam reveals no gallop.  No murmur heard. Pulses:      Dorsalis pedis pulses are 1+ on the right side, and 1+ on the left side.       Posterior tibial pulses are 1+ on the right side, and 1+ on the left side.  Pulmonary/Chest: Effort normal. No respiratory distress. He has no wheezes.  Abdominal: Soft. Bowel sounds are normal. He exhibits distension. There is no tenderness. There is no guarding and no CVA tenderness.  + obese.  Musculoskeletal: He exhibits no edema.       Right shoulder: He exhibits no swelling and no deformity.  Large extremities with no significant pedal and pretibial pitting edema.  Neurological: He is alert and oriented to person, place, and time. He has normal strength and normal  reflexes. No cranial nerve deficit or sensory deficit. Gait normal.  Skin: Skin is warm and dry. No rash noted. No cyanosis. Nails show no clubbing.  Psychiatric: His speech is normal. Cognition and memory are normal.  Reluctant affect.      CMP ( most recent) CMP     Component Value Date/Time   NA 138 11/06/2017 1248   K 5.3 (H) 11/06/2017 1248   CL 95 (L) 11/06/2017 1248  CO2 26 11/06/2017 1248   GLUCOSE 346 (H) 11/06/2017 1248   GLUCOSE 192 (H) 10/26/2014 1233   BUN 15 11/06/2017 1248   CREATININE 1.02 11/06/2017 1248   CALCIUM 9.3 11/06/2017 1248   PROT 7.2 11/06/2017 1248   ALBUMIN 4.1 11/06/2017 1248   AST 31 11/06/2017 1248   ALT 39 11/06/2017 1248   ALKPHOS 213 (H) 11/06/2017 1248   BILITOT 1.0 11/06/2017 1248   GFRNONAA 88 11/06/2017 1248   GFRAA 102 11/06/2017 1248     Diabetic Labs (most recent): Lab Results  Component Value Date   HGBA1C 14 11/06/2017   HGBA1C 14 07/14/2017   HGBA1C 9.3 11/10/2015     Lipid Panel ( most recent) Lipid Panel     Component Value Date/Time   CHOL 156 11/06/2017 1248   TRIG 146 11/06/2017 1248   HDL 32 (L) 11/06/2017 1248   CHOLHDL 4.9 11/06/2017 1248   CHOLHDL 7.0 10/12/2014 1630   VLDL 44 (H) 10/12/2014 1630   LDLCALC 95 11/06/2017 1248      Lab Results  Component Value Date   TSH 4.36 10/26/2015   TSH 3.047 10/11/2014      Assessment & Plan:   1. DM type 2 causing vascular disease (HCC)  - Mark Leach has currently uncontrolled symptomatic type 2 DM since 47 years of age when he was diagnosed with multiple vessel coronary artery disease requiring quadruple bypass on October 13, 2014.  In April 2014 he required stent placement in some of those arteries.   -His most recent 2 measurements of A1c are >14%.   Recent labs reviewed.  -his diabetes is complicated by recurrent coronary artery disease, CHF, morbid obesity/sedentary life and Mark Leach at a high risk for more acute and chronic  complications which include CAD, CVA, CKD, retinopathy, and neuropathy. These are all discussed in detail with the patient.  - I have counseled him on diet management and weight loss, by adopting a carbohydrate restricted/protein rich diet.  - Suggestion is made for him to avoid simple carbohydrates  from his diet including Cakes, Sweet Desserts, Ice Cream, Soda (diet and regular), Sweet Tea, Candies, Chips, Cookies, Store Bought Juices, Alcohol in Excess of  1-2 drinks a day, Artificial Sweeteners, and "Sugar-free" Products. This will help patient to have stable blood glucose profile and potentially avoid unintended weight gain.  - I encouraged him to switch to  unprocessed or minimally processed complex starch and increased protein intake (animal or plant source), fruits, and vegetables.  - he is advised to stick to a routine mealtimes to eat 3 meals  a day and avoid unnecessary snacks ( to snack only to correct hypoglycemia).   - he will be scheduled with Norm Salt, RDN, CDE for individualized diabetes education.  - I have approached him with the following individualized plan to manage diabetes and patient agrees:   -This patient is a good candidate for bariatric surgery given his significant comorbidities and morbid obesity with a BMI of 50.    -For immediate glycemic control he will need intensive treatment with basal/bolus insulin.   -It is essential to assure his commitment for proper monitoring of glucose for safe use of insulin.   -In preparation, I approached him to start monitoring blood glucose 4 times a day-before meals and at bedtime and return in 1 week with his meter and logs.   -The meantime, I have advised him to increase his Levemir to 60 units nightly. - Patient  is warned not to take insulin without proper monitoring per orders.  -Patient is encouraged to call clinic for blood glucose levels less than 70 or above 300 mg /dl. -I have advised him to increase Victoza to  1.8 mg subcutaneously daily, and continue metformin 1000 mg p.o. twice daily. - Patient specific target  A1c;  LDL, HDL, Triglycerides, and  Waist Circumference were discussed in detail.  2) BP/HTN: His blood pressure is not controlled to target at 145/82.  He is advised to continue his current medications including Cozaar, Spironolactone, torsemide, metoprolol, amlodipine, and salt restrictions.   3) Lipids/HPL: Uncontrolled with recent LDL of 95, he is advised to continue atorvastatin 80 mg p.o. nightly.  Side effects and precautions discussed with him.  He will need fasting lipid panel on subsequent visits.  4)  Weight/Diet: CDE Consult will be initiated , exercise, and detailed carbohydrates information provided.  He will be considered for bariatric surgery based on his commitment.  5) Chronic Care/Health Maintenance:  -he  is on ACEI/ARB and Statin medications and  is encouraged to continue to follow up with Ophthalmology, Dentist,  Podiatrist at least yearly or according to recommendations, and advised to  stay away from smoking. I have recommended yearly flu vaccine and pneumonia vaccination at least every 5 years; moderate intensity exercise for up to 150 minutes weekly; and  sleep for at least 7 hours a day.  - I advised patient to maintain close follow up with Junie Spencer, FNP for primary care needs.  - Time spent with the patient: 45 minutes, of which >50% was spent in obtaining information about his symptoms, reviewing his previous labs, evaluations, and treatments, counseling him about his currently uncontrolled, complicated type 2 diabetes, obesity, hypertension, hyperlipidemia, and developing a plan to confirm the diagnosis and long term treatment as necessary.  Mark Leach participated in the discussions, expressed understanding, and voiced agreement with the above plans.  All questions were answered to his satisfaction. he is encouraged to contact clinic should he have any  questions or concerns prior to his return visit.  Follow up plan: - Return in about 1 week (around 01/01/2018) for follow up with meter and logs- no labs.  Marquis Lunch, MD [facility] [address] [place], [place] [address] Phone: [phone]  Fax: [phone]    12/25/2017, 5:24 PM  This note was partially dictated with voice recognition software. Similar sounding words can be transcribed inadequately or may not  be corrected upon review.

## 2017-12-25 NOTE — Patient Instructions (Signed)

## 2017-12-27 ENCOUNTER — Other Ambulatory Visit: Payer: Self-pay | Admitting: Cardiology

## 2018-01-01 ENCOUNTER — Ambulatory Visit: Payer: BLUE CROSS/BLUE SHIELD | Admitting: Internal Medicine

## 2018-01-01 ENCOUNTER — Ambulatory Visit: Payer: BLUE CROSS/BLUE SHIELD | Admitting: "Endocrinology

## 2018-01-01 ENCOUNTER — Encounter: Payer: Self-pay | Admitting: Internal Medicine

## 2018-01-01 DIAGNOSIS — G4733 Obstructive sleep apnea (adult) (pediatric): Secondary | ICD-10-CM

## 2018-01-01 NOTE — Patient Instructions (Signed)
We can continue CPAP auto 5-20, mask of choice, humidifier, supplies, AirView  Please try to put your CPAP on all night, every night. It helps your brain and heart to breathe normally while you sleep.  Please call if we can help

## 2018-01-01 NOTE — Progress Notes (Signed)
HPI M former smoker followed for OSA, complicated by CAD/ MI/ CABG/ CHF, DM2 Unattended Home Sleep study 03/25/15 Mild OSA, AHI 6.1/ hr, desat to 79%, weight 333 lbs  ------------------------------------------------------------------------------------------------- 10/26/2015-47 year old male former smoker followed for OSA, complicated by CAD/MI, DM 2 CPAP  [facility] Follows for: OSA. Pt states that he does have a CPAP machine and uses it 6-8 hours nightly. Pt states that he does still feel tired during the day. Pt has been using the CPAP for a little over 2 months.  Weight on arrival today 360 pounds  01/01/18- 47 year old male former smoker followed for OSA, complicated by CAD/MI, DM 2 CPAP 5-20/ [facility] Admits to using CPAP "on and off" with no real explanation except that he does not bother to put it on. Download compliance 36%, AHI 0.9/hour. Bedtime between 10 PM and midnight, up around 5 AM, "sometimes" naps.  Denies sleeping medicines or caffeine. Son drove him today "he is my transportation". He denies any acute medical problems or breathing discomfort.  ROS-see HPI   + = positive Constitutional:    weight loss, night sweats, fevers, chills, fatigue, lassitude. HEENT:    headaches, difficulty swallowing, tooth/dental problems, sore throat,       sneezing, itching, ear ache, nasal congestion, post nasal drip, snoring CV:    chest pain, orthopnea, PND, swelling in lower extremities, anasarca,                                                  dizziness, palpitations Resp:  + shortness of breath with exertion or at rest.                productive cough,   non-productive cough, coughing up of blood.              change in color of mucus.  wheezing.   Skin:    rash or lesions. GI:  No-   heartburn, indigestion, abdominal pain, nausea, vomiting,  GU: . MS:  + joint pain, stiffness,  Neuro-     nothing unusual Psych:  change in mood or affect.  +depression or anxiety.   memory loss.  OBJ-  Physical Exam   +obese, +unkempt, + poor personal hygiene General- Alert, Oriented, Affect-appropriate, Distress- none acute,  +passive affect Skin- rash-none, lesions- none, excoriation- none Lymphadenopathy- none Head- atraumatic            Eyes- Gross vision intact, PERRLA, conjunctivae and secretions clear            Ears- Hearing, canals-normal            Nose- Clear, no-Septal dev, mucus, polyps, erosion, perforation             Throat- Mallampati II , mucosa clear , drainage- none, tonsils- atrophic Neck- flexible , trachea midline, no stridor , thyroid nl, carotid no bruit Chest - symmetrical excursion , unlabored           Heart/CV- RRR , no murmur , no gallop  , no rub, nl s1 s2                           - JVD- none , edema+2, stasis changes- none, varices- none           Lung- clear to P&A, wheeze- none, cough- none ,  dullness-none, rub- none           Chest wall-  Abd-  Br/ Gen/ Rectal- Not done, not indicated Extrem- + stasis changes and scars on lower extremities, heavy legs Neuro- grossly intact to observation

## 2018-01-02 ENCOUNTER — Encounter: Payer: Self-pay | Admitting: "Endocrinology

## 2018-01-02 ENCOUNTER — Ambulatory Visit: Payer: BLUE CROSS/BLUE SHIELD | Admitting: "Endocrinology

## 2018-01-02 VITALS — BP 161/92 | HR 87 | Ht 71.0 in | Wt 358.0 lb

## 2018-01-02 DIAGNOSIS — E1159 Type 2 diabetes mellitus with other circulatory complications: Secondary | ICD-10-CM | POA: Diagnosis not present

## 2018-01-02 DIAGNOSIS — E782 Mixed hyperlipidemia: Secondary | ICD-10-CM

## 2018-01-02 DIAGNOSIS — I10 Essential (primary) hypertension: Secondary | ICD-10-CM | POA: Diagnosis not present

## 2018-01-02 MED ORDER — INSULIN DETEMIR 100 UNIT/ML FLEXPEN: PEN_INJECTOR | SUBCUTANEOUS | 2 refills | Status: DC

## 2018-01-02 NOTE — Assessment & Plan Note (Signed)
I try to encourage some attention to diet and exercise.

## 2018-01-02 NOTE — Assessment & Plan Note (Signed)
Noncompliant and unconcerned.  I reemphasized the importance of treating his sleep apnea, compliance goals and medical concerns-making the points particularly so his son could hear this. Plan-continue CPAP auto 5-20

## 2018-01-02 NOTE — Progress Notes (Signed)
Endocrinology follow-up note       01/02/2018, 5:34 PM   Subjective:    Patient ID: Mark Leach, male    DOB: 05-Jun-1971.  Mark Leach is being seen in follow-up for management of currently uncontrolled symptomatic diabetes requested by  Junie SpencerHawks, Christy A, FNP.   Past Medical History:  Diagnosis Date  . Chronic systolic CHF (congestive heart failure) (HCC)   . Coronary artery disease 01/2013   anterior MI with cath showing 95% pLAD, 90% diag, 30% OM1, 70% mRCA, 60% dRCA s/p PCI with DES to LAD and diag and EF 40%  . Diabetes mellitus type 2, uncontrolled, with complications (HCC)   . DJD (degenerative joint disease)    knees  . Hypertension   . Ischemic dilated cardiomyopathy (HCC)   . Morbid obesity (HCC)   . 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 Stroke [facility](HCC) 10/12/2014   Patient with several month h/o left sided visual field deficit and MRI of brain revealing:  1. Medial right occipital lobe subacute infarct. 2. At least 3 punctate areas of acute nonhemorrhagic infarct are present involving the left thalamus, high posterior right frontal lobe and high a medial posterior left frontal or parietal lobe. 3. Additional white matter changes are noted beyond the areas of  . Visual field defect 10/12/2014   Past Surgical History:  Procedure Laterality Date  . CARDIAC CATHETERIZATION  01/2013   [facility] [place]  . CHOLECYSTECTOMY    . CORONARY ANGIOPLASTY  01/2013   PCI with stenting of LAD  . CORONARY ARTERY BYPASS GRAFT N/A 10/13/2014   Procedure: CORONARY ARTERY BYPASS GRAFTING (CABG), ON PUMP, TIMES FOUR, USING LEFT INTERNAL MAMMARY ARTERY, BILATERAL GREATER SAPHENOUS VEINS HARVESTED ENDOSCOPICALLY;  Surgeon: Purcell Nailslarence H Owen, MD;  Location: [facility] OR;  Service: Open Heart Surgery;  Laterality: N/A;  . LEFT HEART CATH N/A 10/10/2014   Procedure: LEFT  HEART CATH;  Surgeon: Lennette Biharihomas A Kelly, MD;  [facility];  Service: Cardiovascular;  Laterality: N/A;  . MULTIPLE EXTRACTIONS WITH ALVEOLOPLASTY N/A 10/22/2014   Procedure: Extraction of tooth #'s 1,3,4,5,6,7,8,9,10,11,12,13,14,15,16,20,21,22,24, 25,26,27,28,29,30,32 with alveoloplasty.;  Surgeon: Charlynne Panderonald F Kulinski, DDS;  [facility];  Service: Oral Surgery;  Laterality: N/A;   Social History   Socioeconomic History  . Marital status: Married    Spouse name: Not on file  . Number of children: Not on file  . Years of education: Not on file  . Highest education level: Not on file  Occupational History  . Not on file  Social Needs  . Financial resource strain: Not on file  . Food insecurity:    Worry: Not on file    Inability: Not on file  . Transportation needs:    Medical: Not on file    Non-medical: Not on file  Tobacco Use  . Smoking status: Former Smoker    Packs/day: 1.00    Years: 18.00    Pack years: 18.00    Types: Cigarettes    Start date: 01/04/1990    Last attempt to  quit: 10/12/2007    Years since quitting: 10.2  . Smokeless tobacco: Never Used  Substance and Sexual Activity  . Alcohol use: No    Alcohol/week: 0.0 oz  . Drug use: No  . Sexual activity: Yes  Lifestyle  . Physical activity:    Days per week: Not on file    Minutes per session: Not on file  . Stress: Not on file  Relationships  . Social connections:    Talks on phone: Not on file    Gets together: Not on file    Attends religious service: Not on file    Active member of club or organization: Not on file    Attends meetings of clubs or organizations: Not on file    Relationship status: Not on file  Other Topics Concern  . Not on file  Social History Narrative   Lives w/ wife and 97 yr old daughter in [place].  Unemployed   Outpatient Encounter Medications as of 01/02/2018  Medication Sig  . amLODipine (NORVASC) 5 MG tablet TAKE 1 TABLET ONCE DAILY.  Marland Kitchen aspirin 81 MG tablet Take 81 mg  by mouth daily.  Marland Kitchen atorvastatin (LIPITOR) 80 MG tablet TAKE 1 TABLET DAILY AT 6 PM.  . citalopram (CELEXA) 40 MG tablet TAKE ONE TABLET BY MOUTH AT BEDTIME  . Insulin Detemir (LEVEMIR FLEXTOUCH) 100 UNIT/ML Pen INJECT 80 UNITS SUBCUTANEOUSLY ONCE A DAY AT 10PM.  . liraglutide (VICTOZA) 18 MG/3ML SOPN Inject 0.3 mLs (1.8 mg total) into the skin daily.  Marland Kitchen losartan (COZAAR) 100 MG tablet Take 100 mg by mouth daily.  Marland Kitchen losartan (COZAAR) 100 MG tablet TAKE 1 TABLET DAILY. STOP ENTRESTO  . Magnesium Oxide 400 (240 Mg) MG TABS TAKE (1) TABLET BY MOUTH ONCE DAILY.  . metFORMIN (GLUCOPHAGE) 1000 MG tablet Take 1 tablet (1,000 mg total) by mouth 2 (two) times daily with a meal.  . metoprolol (TOPROL-XL) 200 MG 24 hr tablet TAKE (1) TABLET BY MOUTH ONCE DAILY.  Marland Kitchen Misc Natural Products (BLACK CHERRY CONCENTRATE PO) Take 1 tablet by mouth daily as needed (gout flare-up).   Marland Kitchen spironolactone (ALDACTONE) 25 MG tablet TAKE (1/2) TABLET BY MOUTH DAILY.  Marland Kitchen torsemide (DEMADEX) 20 MG tablet TAKE 2 TABLETS BY MOUTH TWICE DAILY.  . traMADol (ULTRAM) 50 MG tablet Take 1-2 tablets (50-100 mg total) by mouth every 12 (twelve) hours as needed.  . [DISCONTINUED] Insulin Detemir (LEVEMIR FLEXTOUCH) 100 UNIT/ML Pen INJECT 60 UNITS SUBCUTANEOUSLY ONCE A DAY AT 10PM.   No facility-administered encounter medications on file as of 01/02/2018.     ALLERGIES: Allergies  Allergen Reactions  . Nitroglycerin Other (See Comments)    Hypotension, syncope, bradycardia    VACCINATION STATUS: Immunization History  Administered Date(s) Administered  . Influenza,inj,Quad PF,6+ Mos 08/05/2015, 09/12/2016, 07/14/2017  . Pneumococcal Conjugate-13 08/07/2015  . Tdap 02/22/2016    Diabetes  He presents for his follow-up diabetic visit. He has type 2 diabetes mellitus. Onset time: He was diagnosed at approximate age of 47. His disease course has been worsening. There are no hypoglycemic associated symptoms. Pertinent negatives for  hypoglycemia include no confusion, headaches, pallor or seizures. Associated symptoms include blurred vision, foot paresthesias, polydipsia and polyuria. Pertinent negatives for diabetes include no chest pain, no fatigue, no polyphagia and no weakness. There are no hypoglycemic complications. Symptoms are worsening. Diabetic complications include heart disease. Risk factors for coronary artery disease include dyslipidemia, family history, obesity, male sex, hypertension, sedentary lifestyle and tobacco exposure. Current diabetic treatment  includes insulin injections and oral agent (monotherapy). His weight is stable. He is following a generally unhealthy diet. When asked about meal planning, he reported none. He has not had a previous visit with a dietitian. He never participates in exercise. His breakfast blood glucose range is generally >200 mg/dl. His lunch blood glucose range is generally >200 mg/dl. His dinner blood glucose range is generally >200 mg/dl. His bedtime blood glucose range is generally >200 mg/dl. His overall blood glucose range is >200 mg/dl. ( His recent A1c was greater than 14% on November 06, 2017.) An ACE inhibitor/angiotensin II receptor blocker is being taken. He does not see a podiatrist.Eye exam is not current.  Hyperlipidemia  This is a chronic problem. The current episode started more than 1 year ago. The problem is controlled. Exacerbating diseases include diabetes and obesity. Pertinent negatives include no chest pain, myalgias or shortness of breath. Current antihyperlipidemic treatment includes statins. Risk factors for coronary artery disease include diabetes mellitus, dyslipidemia, family history, obesity, male sex and a sedentary lifestyle.    Review of Systems  Constitutional: Negative for chills, fatigue, fever and unexpected weight change.  HENT: Negative for dental problem, mouth sores and trouble swallowing.   Eyes: Positive for blurred vision. Negative for visual  disturbance.  Respiratory: Negative for cough, choking, chest tightness, shortness of breath and wheezing.   Cardiovascular: Negative for chest pain, palpitations and leg swelling.  Gastrointestinal: Negative for abdominal distention, abdominal pain, constipation, diarrhea, nausea and vomiting.  Endocrine: Positive for polydipsia and polyuria. Negative for polyphagia.  Genitourinary: Negative for dysuria, flank pain, hematuria and urgency.  Musculoskeletal: Negative for back pain, myalgias and neck pain.  Skin: Negative for pallor, rash and wound.  Neurological: Negative for seizures, syncope, weakness, numbness and headaches.  Psychiatric/Behavioral: Negative for confusion and dysphoric mood.    Objective:    BP (!) 161/92   Pulse 87   Ht 5\' 11"  (1.803 m)   Wt (!) 358 lb (162.4 kg)   BMI 49.93 kg/m   Wt Readings from Last 3 Encounters:  01/02/18 (!) 358 lb (162.4 kg)  01/01/18 (!) 360 lb (163.3 kg)  12/25/17 (!) 358 lb (162.4 kg)     Physical Exam  Constitutional: He is oriented to person, place, and time. He appears well-developed. He is cooperative. No distress.  + Obese.  HENT:  Head: Normocephalic and atraumatic.  Eyes: EOM are normal.  Neck: Normal range of motion. Neck supple. No tracheal deviation present. No thyromegaly present.  Cardiovascular: Normal rate, S1 normal and S2 normal. Exam reveals no gallop.  No murmur heard. Pulses:      Dorsalis pedis pulses are 1+ on the right side, and 1+ on the left side.       Posterior tibial pulses are 1+ on the right side, and 1+ on the left side.  Pulmonary/Chest: Effort normal. No respiratory distress. He has no wheezes.  Abdominal: Soft. He exhibits distension. There is no tenderness. There is no guarding and no CVA tenderness.  + obese.  Musculoskeletal: He exhibits no edema.       Right shoulder: He exhibits no swelling and no deformity.  Large extremities with no significant pedal and pretibial pitting edema.   Neurological: He is alert and oriented to person, place, and time. He has normal strength and normal reflexes. No cranial nerve deficit or sensory deficit. Gait normal.  Skin: Skin is warm and dry. No rash noted. No cyanosis. Nails show no clubbing.  Psychiatric: His speech  is normal. Cognition and memory are normal.  Reluctant affect.    CMP     Component Value Date/Time   NA 138 11/06/2017 1248   K 5.3 (H) 11/06/2017 1248   CL 95 (L) 11/06/2017 1248   CO2 26 11/06/2017 1248   GLUCOSE 346 (H) 11/06/2017 1248   GLUCOSE 192 (H) 10/26/2014 1233   BUN 15 11/06/2017 1248   CREATININE 1.02 11/06/2017 1248   CALCIUM 9.3 11/06/2017 1248   PROT 7.2 11/06/2017 1248   ALBUMIN 4.1 11/06/2017 1248   AST 31 11/06/2017 1248   ALT 39 11/06/2017 1248   ALKPHOS 213 (H) 11/06/2017 1248   BILITOT 1.0 11/06/2017 1248   GFRNONAA 88 11/06/2017 1248   GFRAA 102 11/06/2017 1248     Diabetic Labs (most recent): Lab Results  Component Value Date   HGBA1C 14 11/06/2017   HGBA1C 14 07/14/2017   HGBA1C 9.3 11/10/2015     Lipid Panel ( most recent) Lipid Panel     Component Value Date/Time   CHOL 156 11/06/2017 1248   TRIG 146 11/06/2017 1248   HDL 32 (L) 11/06/2017 1248   CHOLHDL 4.9 11/06/2017 1248   CHOLHDL 7.0 10/12/2014 1630   VLDL 44 (H) 10/12/2014 1630   LDLCALC 95 11/06/2017 1248      Lab Results  Component Value Date   TSH 4.36 10/26/2015   TSH 3.047 10/11/2014      Assessment & Plan:   1. DM type 2 causing vascular disease (HCC)  - Mark Salk has currently uncontrolled symptomatic type 2 DM since 47 years of age when he was diagnosed with multiple vessel coronary artery disease requiring quadruple bypass on October 13, 2014.  In April 2014 he required stent placement in some of those arteries.   -He returns with significantly above target blood glucose profile averaging 258 over the last week.   - His most recent 2 measurements of A1c are >14%.   Recent labs  reviewed.  -his diabetes is complicated by recurrent coronary artery disease, CHF, morbid obesity/sedentary life and Cornelio A Mahon remains at a high risk for more acute and chronic complications which include CAD, CVA, CKD, retinopathy, and neuropathy. These are all discussed in detail with the patient.  - I have counseled him on diet management and weight loss, by adopting a carbohydrate restricted/protein rich diet.  -  Suggestion is made for him to avoid simple carbohydrates  from his diet including Cakes, Sweet Desserts / Pastries, Ice Cream, Soda (diet and regular), Sweet Tea, Candies, Chips, Cookies, Store Bought Juices, Alcohol in Excess of  1-2 drinks a day, Artificial Sweeteners, and "Sugar-free" Products. This will help patient to have stable blood glucose profile and potentially avoid unintended weight gain.  - I encouraged him to switch to  unprocessed or minimally processed complex starch and increased protein intake (animal or plant source), fruits, and vegetables.  - he is advised to stick to a routine mealtimes to eat 3 meals  a day and avoid unnecessary snacks ( to snack only to correct hypoglycemia).   - he will be scheduled with Norm Salt, RDN, CDE for individualized diabetes education.  - I have approached him with the following individualized plan to manage diabetes and patient agrees:   -This patient is a good candidate for bariatric surgery given his significant comorbidities and morbid obesity with a BMI of 50.  Information brochure and contact number given to him.  -For immediate glycemic control he will likely require  intensive treatment with basal/bolus insulin.    -There seems to be a room to maximize basal insulin first. -I approached him to increase his Levemir to 80 units nightly, associated with monitoring of blood glucose 2 times a day-before fasting at bedtime and patient will return in 5 weeks with repeat labs , meter, and logs. meals and  - Patient is  warned not to take insulin without proper monitoring per orders.  -Patient is encouraged to call clinic for blood glucose levels less than 70 or above 200 mg /dl. -I have advised him to continue Victoza 1.8 mg subcutaneously daily, and metformin 1000 mg p.o. twice daily with meals.   - Patient specific target  A1c;  LDL, HDL, Triglycerides, and  Waist Circumference were discussed in detail.  2) BP/HTN: His blood pressure is not controlled to target .  He is advised to continue his current medications including Cozaar, Spironolactone, torsemide, metoprolol, amlodipine, and salt restrictions.   3) Lipids/HPL: Uncontrolled with recent LDL of 95, he is advised to continue atorvastatin 80 mg p.o. nightly.  Side effects and precautions discussed with him.  He will need fasting lipid panel on subsequent visits.  4)  Weight/Diet: CDE Consult will be initiated , exercise, and detailed carbohydrates information provided.  He is an ideal candidate for bariatric surgery, he is given information brochure and contact number.   5) Chronic Care/Health Maintenance:  -he  is on ACEI/ARB and Statin medications and  is encouraged to continue to follow up with Ophthalmology, Dentist,  Podiatrist at least yearly or according to recommendations, and advised to  stay away from smoking. I have recommended yearly flu vaccine and pneumonia vaccination at least every 5 years; moderate intensity exercise for up to 150 minutes weekly; and  sleep for at least 7 hours a day.  - I advised patient to maintain close follow up with Junie Spencer, FNP for primary care needs.  - Time spent with the patient: 25 min, of which >50% was spent in reviewing his blood glucose logs , discussing his hypo- and hyper-glycemic episodes, reviewing his current and  previous labs and insulin doses and developing a plan to avoid hypo- and hyper-glycemia. Please refer to Patient Instructions for Blood Glucose Monitoring and Insulin/Medications  Dosing Guide"  in media tab for additional information. Mark Salk participated in the discussions, expressed understanding, and voiced agreement with the above plans.  All questions were answered to his satisfaction. he is encouraged to contact clinic should he have any questions or concerns prior to his return visit.   Follow up plan: - Return in about 5 weeks (around 02/06/2018) for meter, and logs.  Marquis Lunch, MD [facility] [address] [place], [place] [address] Phone: [phone]  Fax: [phone]    01/02/2018, 5:34 PM  This note was partially dictated with voice recognition software. Similar sounding words can be transcribed inadequately or may not  be corrected upon review.

## 2018-01-02 NOTE — Patient Instructions (Signed)

## 2018-01-03 ENCOUNTER — Other Ambulatory Visit: Payer: Self-pay

## 2018-01-03 MED ORDER — GLUCOSE BLOOD VI STRP: 1.0000 | ORAL_STRIP | 5 refills | Status: AC | PRN

## 2018-01-05 ENCOUNTER — Encounter: Payer: Self-pay | Admitting: "Endocrinology

## 2018-01-08 ENCOUNTER — Other Ambulatory Visit: Payer: Self-pay | Admitting: "Endocrinology

## 2018-01-08 MED ORDER — BLOOD GLUCOSE MONITOR KIT: PACK | 2 refills | Status: AC

## 2018-01-17 ENCOUNTER — Other Ambulatory Visit: Payer: Self-pay | Admitting: Cardiology

## 2018-01-17 ENCOUNTER — Other Ambulatory Visit: Payer: Self-pay | Admitting: Family

## 2018-01-17 DIAGNOSIS — I10 Essential (primary) hypertension: Secondary | ICD-10-CM

## 2018-02-05 ENCOUNTER — Ambulatory Visit (INDEPENDENT_AMBULATORY_CARE_PROVIDER_SITE_OTHER): Payer: Medicare Other | Admitting: Family

## 2018-02-05 ENCOUNTER — Encounter: Payer: Self-pay | Admitting: Family

## 2018-02-05 VITALS — BP 159/96 | HR 88 | Temp 96.8°F | Ht 71.0 in | Wt 370.0 lb

## 2018-02-05 DIAGNOSIS — E785 Hyperlipidemia, unspecified: Secondary | ICD-10-CM | POA: Diagnosis not present

## 2018-02-05 DIAGNOSIS — M159 Polyosteoarthritis, unspecified: Secondary | ICD-10-CM

## 2018-02-05 DIAGNOSIS — F331 Major depressive disorder, recurrent, moderate: Secondary | ICD-10-CM | POA: Diagnosis not present

## 2018-02-05 DIAGNOSIS — E1159 Type 2 diabetes mellitus with other circulatory complications: Secondary | ICD-10-CM | POA: Diagnosis not present

## 2018-02-05 DIAGNOSIS — M150 Primary generalized (osteo)arthritis: Secondary | ICD-10-CM

## 2018-02-05 DIAGNOSIS — E1169 Type 2 diabetes mellitus with other specified complication: Secondary | ICD-10-CM | POA: Diagnosis not present

## 2018-02-05 DIAGNOSIS — G4733 Obstructive sleep apnea (adult) (pediatric): Secondary | ICD-10-CM

## 2018-02-05 DIAGNOSIS — I2510 Atherosclerotic heart disease of native coronary artery without angina pectoris: Secondary | ICD-10-CM

## 2018-02-05 DIAGNOSIS — IMO0002 Reserved for concepts with insufficient information to code with codable children: Secondary | ICD-10-CM

## 2018-02-05 DIAGNOSIS — Z951 Presence of aortocoronary bypass graft: Secondary | ICD-10-CM | POA: Diagnosis not present

## 2018-02-05 DIAGNOSIS — E1165 Type 2 diabetes mellitus with hyperglycemia: Secondary | ICD-10-CM | POA: Diagnosis not present

## 2018-02-05 DIAGNOSIS — I5043 Acute on chronic combined systolic (congestive) and diastolic (congestive) heart failure: Secondary | ICD-10-CM | POA: Diagnosis not present

## 2018-02-05 DIAGNOSIS — E118 Type 2 diabetes mellitus with unspecified complications: Secondary | ICD-10-CM | POA: Diagnosis not present

## 2018-02-05 DIAGNOSIS — Z9119 Patient's noncompliance with other medical treatment and regimen: Secondary | ICD-10-CM

## 2018-02-05 DIAGNOSIS — Z91199 Patient's noncompliance with other medical treatment and regimen due to unspecified reason: Secondary | ICD-10-CM

## 2018-02-05 DIAGNOSIS — I10 Essential (primary) hypertension: Secondary | ICD-10-CM | POA: Diagnosis not present

## 2018-02-05 LAB — LIPID PANEL
Chol/HDL Ratio: 3.6 ratio (ref 0.0–5.0)
Cholesterol, Total: 109 mg/dL (ref 100–199)
HDL: 30 mg/dL — ABNORMAL LOW
LDL Calculated: 62 mg/dL (ref 0–99)
Triglycerides: 84 mg/dL (ref 0–149)
VLDL Cholesterol Cal: 17 mg/dL (ref 5–40)

## 2018-02-05 LAB — CMP14+EGFR
ALT: 34 IU/L (ref 0–44)
AST: 21 IU/L (ref 0–40)
Albumin/Globulin Ratio: 1.4 (ref 1.2–2.2)
Albumin: 4 g/dL (ref 3.5–5.5)
Alkaline Phosphatase: 222 IU/L — ABNORMAL HIGH (ref 39–117)
BUN/Creatinine Ratio: 16 (ref 9–20)
BUN: 16 mg/dL (ref 6–24)
Bilirubin Total: 0.8 mg/dL (ref 0.0–1.2)
CO2: 26 mmol/L (ref 20–29)
Calcium: 9.3 mg/dL (ref 8.7–10.2)
Chloride: 99 mmol/L (ref 96–106)
Creatinine, Ser: 1 mg/dL (ref 0.76–1.27)
GFR calc Af Amer: 104 mL/min/1.73
GFR calc non Af Amer: 90 mL/min/1.73
Globulin, Total: 2.8 g/dL (ref 1.5–4.5)
Glucose: 164 mg/dL — ABNORMAL HIGH (ref 65–99)
Potassium: 5 mmol/L (ref 3.5–5.2)
Sodium: 140 mmol/L (ref 134–144)
Total Protein: 6.8 g/dL (ref 6.0–8.5)

## 2018-02-05 MED ORDER — TRAMADOL HCL 50 MG PO TABS: 50.0000 mg | ORAL_TABLET | Freq: Two times a day (BID) | ORAL | 2 refills | Status: DC | PRN

## 2018-02-05 NOTE — Patient Instructions (Signed)
Heart Failure °Heart failure means your heart has trouble pumping blood. This makes it hard for your body to work well. Heart failure is usually a long-term (chronic) condition. You must take good care of yourself and follow your doctor's treatment plan. °Follow these instructions at home: °· Take your heart medicine as told by your doctor. °? Do not stop taking medicine unless your doctor tells you to. °? Do not skip any dose of medicine. °? Refill your medicines before they run out. °? Take other medicines only as told by your doctor or pharmacist. °· Stay active if told by your doctor. The elderly and people with severe heart failure should talk with a doctor about physical activity. °· Eat heart-healthy foods. Choose foods that are without trans fat and are low in saturated fat, cholesterol, and salt (sodium). This includes fresh or frozen fruits and vegetables, fish, lean meats, fat-free or low-fat dairy foods, whole grains, and high-fiber foods. Lentils and dried peas and beans (legumes) are also good choices. °· Limit salt if told by your doctor. °· Cook in a healthy way. Roast, grill, broil, bake, poach, steam, or stir-fry foods. °· Limit fluids as told by your doctor. °· Weigh yourself every morning. Do this after you pee (urinate) and before you eat breakfast. Write down your weight to give to your doctor. °· Take your blood pressure and write it down if your doctor tells you to. °· Ask your doctor how to check your pulse. Check your pulse as told. °· Lose weight if told by your doctor. °· Stop smoking or chewing tobacco. Do not use gum or patches that help you quit without your doctor's approval. °· Schedule and go to doctor visits as told. °· Nonpregnant women should have no more than 1 drink a day. Men should have no more than 2 drinks a day. Talk to your doctor about drinking alcohol. °· Stop illegal drug use. °· Stay current with shots (immunizations). °· Manage your health conditions as told by your  doctor. °· Learn to manage your stress. °· Rest when you are tired. °· If it is really hot outside: °? Avoid intense activities. °? Use air conditioning or fans, or get in a cooler place. °? Avoid caffeine and alcohol. °? Wear loose-fitting, lightweight, and light-colored clothing. °· If it is really cold outside: °? Avoid intense activities. °? Layer your clothing. °? Wear mittens or gloves, a hat, and a scarf when going outside. °? Avoid alcohol. °· Learn about heart failure and get support as needed. °· Get help to maintain or improve your quality of life and your ability to care for yourself as needed. °Contact a doctor if: °· You gain weight quickly. °· You are more short of breath than usual. °· You cannot do your normal activities. °· You tire easily. °· You cough more than normal, especially with activity. °· You have any or more puffiness (swelling) in areas such as your hands, feet, ankles, or belly (abdomen). °· You cannot sleep because it is hard to breathe. °· You feel like your heart is beating fast (palpitations). °· You get dizzy or light-headed when you stand up. °Get help right away if: °· You have trouble breathing. °· There is a change in mental status, such as becoming less alert or not being able to focus. °· You have chest pain or discomfort. °· You faint. °This information is not intended to replace advice given to you by your health care provider. Make sure you   discuss any questions you have with your health care provider. °Document Released: 07/05/2008 Document Revised: 03/03/2016 Document Reviewed: 11/12/2012 °Elsevier Interactive Patient Education © 2017 [employer] ° °

## 2018-02-05 NOTE — Progress Notes (Signed)
Subjective:    Patient ID: Mark Leach, male    DOB: Aug 07, 1971, 47 y.o.   MRN: [id number]  Pt presents to the office today for chronic follow up. Pt is followed by his Cardiologists every 3 month for CHF, HX of CABG, and HX of stroke and MI. Pt states he is suppose to weight himself daily, but he is noncompliant.  PT is followed by Endo for uncontrolled DM2. Pt is noncompliant with his diet and medications.  Hypertension  This is a chronic problem. The current episode started more than 1 year ago. The problem has been waxing and waning since onset. The problem is uncontrolled. Associated symptoms include malaise/fatigue and peripheral edema. Pertinent negatives include no shortness of breath. Risk factors for coronary artery disease include dyslipidemia. Past treatments include angiotensin blockers and diuretics. Hypertensive end-organ damage includes CAD/MI and heart failure.  Hyperlipidemia  This is a chronic problem. The problem is uncontrolled. Recent lipid tests were reviewed and are high. Exacerbating diseases include obesity. Pertinent negatives include no shortness of breath. Current antihyperlipidemic treatment includes statins. The current treatment provides mild improvement of lipids. Risk factors for coronary artery disease include diabetes mellitus, dyslipidemia, hypertension, male sex and a sedentary lifestyle.  Depression         This is a chronic problem.  The current episode started more than 1 year ago.   The onset quality is gradual.   The problem occurs intermittently.The problem is unchanged.  Associated symptoms include helplessness, hopelessness, irritable, decreased interest and sad.  Past treatments include nothing. Arthritis  Presents for follow-up visit. He complains of pain and stiffness. The symptoms have been worsening. Affected locations include the right knee and left knee. His pain is at a severity of 3/10.  OSA PT states he sleeps with his CPAP nightly.      Review of Systems  Constitutional: Positive for malaise/fatigue.  Respiratory: Negative for shortness of breath.   Musculoskeletal: Positive for arthritis and stiffness.  Psychiatric/Behavioral: Positive for depression.  All other systems reviewed and are negative.      Objective:   Physical Exam  Constitutional: He is oriented to person, place, and time. He appears well-developed and well-nourished. He is irritable. No distress.  Morbid obese   HENT:  Head: Normocephalic.  Right Ear: External ear normal.  Left Ear: External ear normal.  Eyes: Pupils are equal, round, and reactive to light. Right eye exhibits no discharge. Left eye exhibits no discharge.  Neck: No thyromegaly present.  Cardiovascular: Normal rate, regular rhythm, normal heart sounds and intact distal pulses.  No murmur heard. Pulmonary/Chest: Effort normal and breath sounds normal. No respiratory distress. He has no wheezes.  Abdominal: Soft. Bowel sounds are normal. He exhibits no distension. There is no tenderness.  Musculoskeletal: He exhibits edema (2+ BLE). He exhibits no tenderness.  Generalized weakness, using cane   Neurological: He is alert and oriented to person, place, and time. He has normal reflexes. No cranial nerve deficit.  Skin: Skin is warm and dry. No rash noted. No erythema.  Psychiatric: He has a normal mood and affect. His behavior is normal. Judgment and thought content normal.  Vitals reviewed.     BP (!) 159/96   Pulse 88   Temp (!) 96.8 F (36 C) (Oral)   Ht '5\' 11"'$  (1.803 m)   Wt (!) 370 lb (167.8 kg)   BMI 51.60 kg/m      Assessment & Plan:  1. Coronary artery disease involving native  coronary artery of native heart without angina pectoris - CMP14+EGFR  2. Hypertension associated with diabetes ([place]) Pt states he has not been taking his Torsemide 20 mgcorrectly. States he takes it "maybe 1-2 tablets a day". Encouraged patient to start taking 2 tablets (40 gm)  BID. Once he starts this and his BP is still elevated we will increase Norvasc to 10 mg Low salt diet discussed - CMP14+EGFR  3. DM type 2 causing vascular disease (HCC) - CMP14+EGFR  4. Essential hypertension, benign - CMP14+EGFR  5. Acute on chronic combined systolic and diastolic heart failure (HCC) - CMP14+EGFR  6. Diabetes mellitus type 2, uncontrolled, with complications (HCC) - YWV14+CJAR  7. Hyperlipidemia associated with type 2 diabetes mellitus (HCC) - CMP14+EGFR - Lipid panel  8. Morbid obesity ([place]) - CMP14+EGFR  9. S/P CABG x 4 - CMP14+EGFR  10. Moderate episode of recurrent major depressive disorder (HCC) - CMP14+EGFR  11. Noncompliance - CMP14+EGFR  12. Obstructive sleep apnea - CMP14+EGFR  13. Primary osteoarthritis involving multiple joints Will increase Ultram to 120 tablets a month from 90 tablets a month - traMADol (ULTRAM) 50 MG tablet; Take 1-2 tablets (50-100 mg total) by mouth every 12 (twelve) hours as needed.  Dispense: 120 tablet; Refill: 2   Continue all meds Labs pending Health Maintenance reviewed Diet and exercise encouraged RTO 3 month, Keep Endocrinologists and Cardiologists appts!!!   Evelina Dun, FNP

## 2018-02-06 ENCOUNTER — Other Ambulatory Visit: Payer: Self-pay | Admitting: Family

## 2018-02-09 ENCOUNTER — Other Ambulatory Visit: Payer: Self-pay | Admitting: Family

## 2018-02-10 ENCOUNTER — Other Ambulatory Visit: Payer: Self-pay | Admitting: Family

## 2018-02-10 ENCOUNTER — Encounter: Payer: Self-pay | Admitting: "Endocrinology

## 2018-02-12 ENCOUNTER — Other Ambulatory Visit: Payer: Self-pay | Admitting: "Endocrinology

## 2018-02-12 MED ORDER — LIRAGLUTIDE 18 MG/3ML ~~LOC~~ SOPN: PEN_INJECTOR | SUBCUTANEOUS | 0 refills | Status: DC

## 2018-02-12 MED ORDER — INSULIN DETEMIR 100 UNIT/ML FLEXPEN: PEN_INJECTOR | SUBCUTANEOUS | 0 refills | Status: DC

## 2018-02-28 ENCOUNTER — Ambulatory Visit: Payer: BLUE CROSS/BLUE SHIELD | Admitting: "Endocrinology

## 2018-02-28 ENCOUNTER — Ambulatory Visit: Payer: Self-pay | Admitting: Nutrition

## 2018-03-12 ENCOUNTER — Other Ambulatory Visit: Payer: Self-pay | Admitting: Cardiology

## 2018-03-13 ENCOUNTER — Ambulatory Visit: Payer: Medicare Other | Admitting: "Endocrinology

## 2018-03-31 ENCOUNTER — Other Ambulatory Visit: Payer: Self-pay | Admitting: Family

## 2018-04-18 ENCOUNTER — Other Ambulatory Visit: Payer: Self-pay | Admitting: Cardiology

## 2018-04-27 ENCOUNTER — Ambulatory Visit: Payer: Medicare Other | Admitting: "Endocrinology

## 2018-05-07 ENCOUNTER — Ambulatory Visit: Payer: Self-pay | Admitting: Nutrition

## 2018-05-08 ENCOUNTER — Ambulatory Visit: Payer: Medicare Other | Admitting: Family

## 2018-05-14 ENCOUNTER — Ambulatory Visit: Payer: Medicare Other | Admitting: Family

## 2018-05-25 ENCOUNTER — Ambulatory Visit: Payer: Medicare Other | Admitting: Family

## 2018-06-05 ENCOUNTER — Encounter: Payer: Self-pay | Admitting: "Endocrinology

## 2018-06-05 ENCOUNTER — Ambulatory Visit: Payer: Medicare Other | Admitting: "Endocrinology

## 2018-06-15 ENCOUNTER — Ambulatory Visit: Payer: Medicare Other | Admitting: Family

## 2018-07-16 ENCOUNTER — Ambulatory Visit: Payer: Medicare Other | Admitting: Family

## 2018-07-17 ENCOUNTER — Other Ambulatory Visit: Payer: Self-pay | Admitting: Cardiology

## 2018-07-24 ENCOUNTER — Encounter: Payer: Self-pay | Admitting: Family

## 2018-07-24 ENCOUNTER — Other Ambulatory Visit: Payer: Self-pay | Admitting: Family

## 2018-07-24 ENCOUNTER — Ambulatory Visit (INDEPENDENT_AMBULATORY_CARE_PROVIDER_SITE_OTHER): Payer: Medicare Other | Admitting: Family

## 2018-07-24 VITALS — BP 165/93 | HR 91 | Temp 98.5°F | Ht 71.0 in | Wt 354.4 lb

## 2018-07-24 DIAGNOSIS — M159 Polyosteoarthritis, unspecified: Secondary | ICD-10-CM

## 2018-07-24 DIAGNOSIS — I5022 Chronic systolic (congestive) heart failure: Secondary | ICD-10-CM

## 2018-07-24 DIAGNOSIS — I10 Essential (primary) hypertension: Secondary | ICD-10-CM | POA: Diagnosis not present

## 2018-07-24 DIAGNOSIS — I2510 Atherosclerotic heart disease of native coronary artery without angina pectoris: Secondary | ICD-10-CM | POA: Diagnosis not present

## 2018-07-24 DIAGNOSIS — M150 Primary generalized (osteo)arthritis: Secondary | ICD-10-CM

## 2018-07-24 DIAGNOSIS — E785 Hyperlipidemia, unspecified: Secondary | ICD-10-CM

## 2018-07-24 DIAGNOSIS — Z951 Presence of aortocoronary bypass graft: Secondary | ICD-10-CM

## 2018-07-24 DIAGNOSIS — E1159 Type 2 diabetes mellitus with other circulatory complications: Secondary | ICD-10-CM | POA: Diagnosis not present

## 2018-07-24 DIAGNOSIS — G4733 Obstructive sleep apnea (adult) (pediatric): Secondary | ICD-10-CM

## 2018-07-24 DIAGNOSIS — E1169 Type 2 diabetes mellitus with other specified complication: Secondary | ICD-10-CM | POA: Diagnosis not present

## 2018-07-24 DIAGNOSIS — F1120 Opioid dependence, uncomplicated: Secondary | ICD-10-CM | POA: Insufficient documentation

## 2018-07-24 DIAGNOSIS — Z0289 Encounter for other administrative examinations: Secondary | ICD-10-CM | POA: Insufficient documentation

## 2018-07-24 DIAGNOSIS — E1165 Type 2 diabetes mellitus with hyperglycemia: Secondary | ICD-10-CM

## 2018-07-24 DIAGNOSIS — Z91199 Patient's noncompliance with other medical treatment and regimen due to unspecified reason: Secondary | ICD-10-CM

## 2018-07-24 DIAGNOSIS — IMO0002 Reserved for concepts with insufficient information to code with codable children: Secondary | ICD-10-CM

## 2018-07-24 DIAGNOSIS — E118 Type 2 diabetes mellitus with unspecified complications: Secondary | ICD-10-CM | POA: Diagnosis not present

## 2018-07-24 DIAGNOSIS — Z9119 Patient's noncompliance with other medical treatment and regimen: Secondary | ICD-10-CM

## 2018-07-24 LAB — CBC WITH DIFFERENTIAL/PLATELET
Basophils Absolute: 0 10*3/uL (ref 0.0–0.2)
Basos: 0 %
EOS (ABSOLUTE): 0.2 10*3/uL (ref 0.0–0.4)
EOS: 3 %
HEMATOCRIT: 46.5 % (ref 37.5–51.0)
Hemoglobin: 15.9 g/dL (ref 13.0–17.7)
IMMATURE GRANULOCYTES: 1 %
Immature Grans (Abs): 0.1 10*3/uL (ref 0.0–0.1)
Lymphocytes Absolute: 0.9 10*3/uL (ref 0.7–3.1)
Lymphs: 14 %
MCH: 29.1 pg (ref 26.6–33.0)
MCHC: 34.2 g/dL (ref 31.5–35.7)
MCV: 85 fL (ref 79–97)
MONOS ABS: 0.4 10*3/uL (ref 0.1–0.9)
Monocytes: 6 %
NEUTROS PCT: 76 %
Neutrophils Absolute: 5.1 10*3/uL (ref 1.4–7.0)
Platelets: 193 10*3/uL (ref 150–450)
RBC: 5.46 x10E6/uL (ref 4.14–5.80)
RDW: 12 % — AB (ref 12.3–15.4)
WBC: 6.7 10*3/uL (ref 3.4–10.8)

## 2018-07-24 LAB — LIPID PANEL
CHOL/HDL RATIO: 6.5 ratio — AB (ref 0.0–5.0)
Cholesterol, Total: 175 mg/dL (ref 100–199)
HDL: 27 mg/dL — AB (ref >39)
LDL CALC: 115 mg/dL — AB (ref 0–99)
Triglycerides: 165 mg/dL — ABNORMAL HIGH (ref 0–149)
VLDL CHOLESTEROL CAL: 33 mg/dL (ref 5–40)

## 2018-07-24 LAB — CMP14+EGFR
ALT: 32 IU/L (ref 0–44)
AST: 20 IU/L (ref 0–40)
Albumin/Globulin Ratio: 1.3 (ref 1.2–2.2)
Albumin: 3.8 g/dL (ref 3.5–5.5)
Alkaline Phosphatase: 248 IU/L — ABNORMAL HIGH (ref 39–117)
BUN/Creatinine Ratio: 17 (ref 9–20)
BUN: 16 mg/dL (ref 6–24)
Bilirubin Total: 0.7 mg/dL (ref 0.0–1.2)
CO2: 24 mmol/L (ref 20–29)
Calcium: 9.3 mg/dL (ref 8.7–10.2)
Chloride: 94 mmol/L — ABNORMAL LOW (ref 96–106)
Creatinine, Ser: 0.95 mg/dL (ref 0.76–1.27)
GFR calc Af Amer: 111 mL/min/{1.73_m2} (ref >59)
GFR calc non Af Amer: 96 mL/min/{1.73_m2} (ref >59)
GLUCOSE: 348 mg/dL — AB (ref 65–99)
Globulin, Total: 2.9 g/dL (ref 1.5–4.5)
Potassium: 4.6 mmol/L (ref 3.5–5.2)
SODIUM: 135 mmol/L (ref 134–144)
TOTAL PROTEIN: 6.7 g/dL (ref 6.0–8.5)

## 2018-07-24 LAB — BAYER DCA HB A1C WAIVED

## 2018-07-24 MED ORDER — TRAMADOL HCL 50 MG PO TABS: 50.0000 mg | ORAL_TABLET | Freq: Two times a day (BID) | ORAL | 2 refills | Status: DC | PRN

## 2018-07-24 NOTE — Progress Notes (Signed)
Subjective:    Patient ID: Mark Leach, male    DOB: Mar 03, 1971, 47 y.o.   MRN: [id number]  Chief Complaint  Patient presents with  . Medical Management of Chronic Issues    three month recheck  . Diabetes   Pt presents to the office today for chronic follow up. Pt is followed by his Cardiologists every 3 month for CHF, HX of CABG, and HX of stroke and MI. Pt states he is suppose to weight himself daily, but he is noncompliant.  PT is followed by Endo for uncontrolled DM2. Pt is noncompliant with his diet and medications.  States he has missed both his Cardiologists and Endocrinologists appointments because of transportation issues. He states he has not been taking his medications regularly, but did take his blood pressure medications this morning.  Diabetes  He presents for his follow-up diabetic visit. He has type 2 diabetes mellitus. His disease course has been worsening. There are no hypoglycemic associated symptoms. Pertinent negatives for diabetes include no blurred vision, no foot paresthesias and no visual change. Symptoms are worsening. Diabetic complications include a CVA and heart disease. Risk factors for coronary artery disease include dyslipidemia, diabetes mellitus, male sex, hypertension and sedentary lifestyle. He is following a generally unhealthy diet. His overall blood glucose range is 180-200 mg/dl. Eye exam is not current.  Hypertension  This is a chronic problem. The current episode started more than 1 year ago. The problem has been waxing and waning since onset. The problem is uncontrolled. Associated symptoms include peripheral edema and shortness of breath. Pertinent negatives include no blurred vision. Risk factors for coronary artery disease include dyslipidemia, diabetes mellitus and male gender. The current treatment provides mild improvement. Hypertensive end-organ damage includes CAD/MI and CVA.  Hyperlipidemia  This is a chronic problem. The current  episode started more than 1 year ago. The problem is controlled. Recent lipid tests were reviewed and are normal. Exacerbating diseases include obesity. Associated symptoms include shortness of breath. Current antihyperlipidemic treatment includes statins. The current treatment provides moderate improvement of lipids. Risk factors for coronary artery disease include diabetes mellitus, dyslipidemia, male sex, hypertension and a sedentary lifestyle.  Arthritis  Presents for follow-up visit. He complains of pain and stiffness. Affected locations include the right knee and left knee. His pain is at a severity of 10/10.  Depression         This is a chronic problem.  The current episode started more than 1 year ago.   The onset quality is gradual.   The problem occurs intermittently.  The problem has been waxing and waning since onset.  Associated symptoms include irritable, restlessness, decreased interest and sad.  Associated symptoms include no helplessness and no hopelessness.  Past treatments include nothing. OSA Pt sleeps with CPAP nightly.     Review of Systems  Eyes: Negative for blurred vision.  Respiratory: Positive for shortness of breath.   Cardiovascular: Positive for leg swelling.  Musculoskeletal: Positive for arthritis and stiffness.  Psychiatric/Behavioral: Positive for depression.  All other systems reviewed and are negative.      Objective:   Physical Exam  Constitutional: He is oriented to person, place, and time. He appears well-developed and well-nourished. He is irritable. No distress.  Morbid obese  HENT:  Head: Normocephalic.  Right Ear: External ear normal.  Left Ear: External ear normal.  Mouth/Throat: Oropharynx is clear and moist.  Eyes: Pupils are equal, round, and reactive to light. Right eye exhibits no discharge. Left  eye exhibits no discharge.  Neck: Normal range of motion. Neck supple. No thyromegaly present.  Cardiovascular: Normal rate, regular rhythm,  normal heart sounds and intact distal pulses.  No murmur heard. Pulmonary/Chest: Effort normal and breath sounds normal. No respiratory distress. He has no wheezes.  Abdominal: Soft. Bowel sounds are normal. He exhibits no distension. There is no tenderness.  Musculoskeletal: He exhibits edema (3+ BLe). He exhibits no tenderness.  generalized weakness, using cane   Neurological: He is alert and oriented to person, place, and time. He has normal reflexes. No cranial nerve deficit.  Skin: Skin is warm and dry. No rash noted. No erythema.  Psychiatric: He has a normal mood and affect. His behavior is normal. Judgment and thought content normal.  Vitals reviewed.     BP (!) 165/93   Pulse 91   Temp 98.5 F (36.9 C) (Oral)   Ht 5' 11" (1.803 m)   Wt (!) 354 lb 6.4 oz (160.8 kg)   BMI 49.43 kg/m      Assessment & Plan:  Mark Leach comes in today with chief complaint of Medical Management of Chronic Issues (three month recheck) and Diabetes   Diagnosis and orders addressed:  1. Hypertension associated with diabetes ([place]) - CMP14+EGFR - CBC with Differential/Platelet  2. DM type 2 causing vascular disease ([place]) - Bayer DCA Hb A1c Waived - CMP14+EGFR - CBC with Differential/Platelet  3. Diabetes mellitus type 2, uncontrolled, with complications (HCC) - NLZ76+BHAL - CBC with Differential/Platelet  4. Hyperlipidemia associated with type 2 diabetes mellitus ([place]) - CMP14+EGFR - CBC with Differential/Platelet - Lipid panel  5. Coronary artery disease involving native coronary artery of native heart without angina pectoris - CMP14+EGFR - CBC with Differential/Platelet  6. Noncompliance - CMP14+EGFR - CBC with Differential/Platelet  7. S/P CABG x 4 - CMP14+EGFR - CBC with Differential/Platelet  8. Morbid obesity ([place]) - CMP14+EGFR - CBC with Differential/Platelet  9. Primary osteoarthritis involving multiple joints - CMP14+EGFR - CBC with  Differential/Platelet - traMADol (ULTRAM) 50 MG tablet; Take 1-2 tablets (50-100 mg total) by mouth every 12 (twelve) hours as needed.  Dispense: 120 tablet; Refill: 2 - ToxASSURE Select 13 (MW), Urine  10. Obstructive sleep apnea - CMP14+EGFR - CBC with Differential/Platelet  11. Chronic systolic heart failure (HCC) - CMP14+EGFR - CBC with Differential/Platelet  12. Uncomplicated opioid dependence (HCC) - traMADol (ULTRAM) 50 MG tablet; Take 1-2 tablets (50-100 mg total) by mouth every 12 (twelve) hours as needed.  Dispense: 120 tablet; Refill: 2 - ToxASSURE Select 13 (MW), Urine  13. Pain management contract signed - traMADol (ULTRAM) 50 MG tablet; Take 1-2 tablets (50-100 mg total) by mouth every 12 (twelve) hours as needed.  Dispense: 120 tablet; Refill: 2 - ToxASSURE Select 13 (MW), Urine  Long discussion with patient about his compliance. I told him he continued down the path he is currently going I estimate he would not live longer than 5 years. We discussed that he will start taking medications daily and follow up with his Cardiologists and Endocrinologists.    Labs pending Health Maintenance reviewed Diet and exercise encouraged  Follow up plan: 1 month  Evelina Dun, FNP

## 2018-07-24 NOTE — Patient Instructions (Signed)
Diabetes Mellitus and Nutrition When you have diabetes (diabetes mellitus), it is very important to have healthy eating habits because your blood sugar (glucose) levels are greatly affected by what you eat and drink. Eating healthy foods in the appropriate amounts, at about the same times every day, can help you:  Control your blood glucose.  Lower your risk of heart disease.  Improve your blood pressure.  Reach or maintain a healthy weight.  Every person with diabetes is different, and each person has different needs for a meal plan. Your health care provider may recommend that you work with a diet and nutrition specialist (dietitian) to make a meal plan that is best for you. Your meal plan may vary depending on factors such as:  The calories you need.  The medicines you take.  Your weight.  Your blood glucose, blood pressure, and cholesterol levels.  Your activity level.  Other health conditions you have, such as heart or kidney disease.  How do carbohydrates affect me? Carbohydrates affect your blood glucose level more than any other type of food. Eating carbohydrates naturally increases the amount of glucose in your blood. Carbohydrate counting is a method for keeping track of how many carbohydrates you eat. Counting carbohydrates is important to keep your blood glucose at a healthy level, especially if you use insulin or take certain oral diabetes medicines. It is important to know how many carbohydrates you can safely have in each meal. This is different for every person. Your dietitian can help you calculate how many carbohydrates you should have at each meal and for snack. Foods that contain carbohydrates include:  Bread, cereal, rice, pasta, and crackers.  Potatoes and corn.  Peas, beans, and lentils.  Milk and yogurt.  Fruit and juice.  Desserts, such as cakes, cookies, ice cream, and candy.  How does alcohol affect me? Alcohol can cause a sudden decrease in blood  glucose (hypoglycemia), especially if you use insulin or take certain oral diabetes medicines. Hypoglycemia can be a life-threatening condition. Symptoms of hypoglycemia (sleepiness, dizziness, and confusion) are similar to symptoms of having too much alcohol. If your health care provider says that alcohol is safe for you, follow these guidelines:  Limit alcohol intake to no more than 1 drink per day for nonpregnant women and 2 drinks per day for men. One drink equals 12 oz of beer, 5 oz of wine, or 1 oz of hard liquor.  Do not drink on an empty stomach.  Keep yourself hydrated with water, diet soda, or unsweetened iced tea.  Keep in mind that regular soda, juice, and other mixers may contain a lot of sugar and must be counted as carbohydrates.  What are tips for following this plan? Reading food labels  Start by checking the serving size on the label. The amount of calories, carbohydrates, fats, and other nutrients listed on the label are based on one serving of the food. Many foods contain more than one serving per package.  Check the total grams (g) of carbohydrates in one serving. You can calculate the number of servings of carbohydrates in one serving by dividing the total carbohydrates by 15. For example, if a food has 30 g of total carbohydrates, it would be equal to 2 servings of carbohydrates.  Check the number of grams (g) of saturated and trans fats in one serving. Choose foods that have low or no amount of these fats.  Check the number of milligrams (mg) of sodium in one serving. Most people   should limit total sodium intake to less than 2,300 mg per day.  Always check the nutrition information of foods labeled as "low-fat" or "nonfat". These foods may be higher in added sugar or refined carbohydrates and should be avoided.  Talk to your dietitian to identify your daily goals for nutrients listed on the label. Shopping  Avoid buying canned, premade, or processed foods. These  foods tend to be high in fat, sodium, and added sugar.  Shop around the outside edge of the grocery store. This includes fresh fruits and vegetables, bulk grains, fresh meats, and fresh dairy. Cooking  Use low-heat cooking methods, such as baking, instead of high-heat cooking methods like deep frying.  Cook using healthy oils, such as olive, canola, or sunflower oil.  Avoid cooking with butter, cream, or high-fat meats. Meal planning  Eat meals and snacks regularly, preferably at the same times every day. Avoid going long periods of time without eating.  Eat foods high in fiber, such as fresh fruits, vegetables, beans, and whole grains. Talk to your dietitian about how many servings of carbohydrates you can eat at each meal.  Eat 4-6 ounces of lean protein each day, such as lean meat, chicken, fish, eggs, or tofu. 1 ounce is equal to 1 ounce of meat, chicken, or fish, 1 egg, or 1/4 cup of tofu.  Eat some foods each day that contain healthy fats, such as avocado, nuts, seeds, and fish. Lifestyle   Check your blood glucose regularly.  Exercise at least 30 minutes 5 or more days each week, or as told by your health care provider.  Take medicines as told by your health care provider.  Do not use any products that contain nicotine or tobacco, such as cigarettes and e-cigarettes. If you need help quitting, ask your health care provider.  Work with a counselor or diabetes educator to identify strategies to manage stress and any emotional and social challenges. What are some questions to ask my health care provider?  Do I need to meet with a diabetes educator?  Do I need to meet with a dietitian?  What number can I call if I have questions?  When are the best times to check my blood glucose? Where to find more information:  American Diabetes Association: diabetes.org/food-and-fitness/food  Academy of Nutrition and Dietetics:  www.eatright.org/resources/health/diseases-and-conditions/diabetes  National Institute of Diabetes and Digestive and Kidney Diseases (NIH): www.niddk.nih.gov/health-information/diabetes/overview/diet-eating-physical-activity Summary  A healthy meal plan will help you control your blood glucose and maintain a healthy lifestyle.  Working with a diet and nutrition specialist (dietitian) can help you make a meal plan that is best for you.  Keep in mind that carbohydrates and alcohol have immediate effects on your blood glucose levels. It is important to count carbohydrates and to use alcohol carefully. This information is not intended to replace advice given to you by your health care provider. Make sure you discuss any questions you have with your health care provider. Document Released: 06/23/2005 Document Revised: 10/31/2016 Document Reviewed: 10/31/2016 Elsevier Interactive Patient Education  2018 [employer]  

## 2018-07-29 LAB — TOXASSURE SELECT 13 (MW), URINE

## 2018-08-03 ENCOUNTER — Other Ambulatory Visit: Payer: Self-pay | Admitting: Cardiology

## 2018-08-15 ENCOUNTER — Encounter: Payer: Self-pay | Admitting: Physician Assistant

## 2018-08-15 ENCOUNTER — Ambulatory Visit (INDEPENDENT_AMBULATORY_CARE_PROVIDER_SITE_OTHER): Payer: Medicare Other | Admitting: Physician Assistant

## 2018-08-15 VITALS — BP 193/98 | HR 102 | Temp 97.9°F | Ht 71.0 in | Wt 355.6 lb

## 2018-08-15 DIAGNOSIS — M7989 Other specified soft tissue disorders: Secondary | ICD-10-CM | POA: Diagnosis not present

## 2018-08-15 DIAGNOSIS — I152 Hypertension secondary to endocrine disorders: Secondary | ICD-10-CM

## 2018-08-15 DIAGNOSIS — I10 Essential (primary) hypertension: Secondary | ICD-10-CM | POA: Diagnosis not present

## 2018-08-15 DIAGNOSIS — E1159 Type 2 diabetes mellitus with other circulatory complications: Secondary | ICD-10-CM

## 2018-08-15 DIAGNOSIS — M109 Gout, unspecified: Secondary | ICD-10-CM | POA: Diagnosis not present

## 2018-08-15 MED ORDER — COLCHICINE 0.6 MG PO TABS: 0.6000 mg | ORAL_TABLET | Freq: Every day | ORAL | 0 refills | Status: DC

## 2018-08-15 MED ORDER — HYDROCODONE-ACETAMINOPHEN 10-325 MG PO TABS: 1.0000 | ORAL_TABLET | ORAL | 0 refills | Status: DC | PRN

## 2018-08-15 MED ORDER — PREDNISONE 10 MG PO TABS: 10.0000 mg | ORAL_TABLET | Freq: Every day | ORAL | 0 refills | Status: DC

## 2018-08-16 NOTE — Progress Notes (Signed)
BP (!) 193/98   Pulse (!) 102   Temp 97.9 F (36.6 C) (Oral)   Ht '5\' 11"'$  (1.803 m)   Wt (!) 355 lb 9.6 oz (161.3 kg)   BMI 49.60 kg/m    Subjective:    Patient ID: Mark Leach, male    DOB: Mar 04, 1971, 47 y.o.   MRN: [id number]  HPI: Mark Leach is a 47 y.o. male presenting on 08/15/2018 for right arm down to hand, swelling  This patient comes in with increasing right arm to wrist pain and swelling.  It has gone on for about a week.  Nothing he is taken has helped.  He has a history of gout.  He is not taking anything for this.  He is in a lot of pain.  He denies any specific injury or laceration or skin break in the area.  His blood pressure has also been quite elevated in the last few days related to the pain.  I have encouraged him to check his blood pressure regularly over the next week.  And to report back to his PCP if it continues to be this elevated.  Past Medical History:  Diagnosis Date  . Chronic systolic CHF (congestive heart failure) ([place])   . Coronary artery disease 01/2013   anterior MI with cath showing 95% pLAD, 90% diag, 30% OM1, 70% mRCA, 60% dRCA s/p PCI with DES to LAD and diag and EF 40%  . Diabetes mellitus type 2, uncontrolled, with complications ([place])   . DJD (degenerative joint disease)    knees  . Hypertension   . Ischemic dilated cardiomyopathy ([place])   . Morbid obesity ([place])   . 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 Stroke [facility]) 10/12/2014   Patient with several month h/o left sided visual field deficit and MRI of brain revealing:  1. Medial right occipital lobe subacute infarct. 2. At least 3 punctate areas of acute nonhemorrhagic infarct are present involving the left thalamus, high posterior right frontal lobe and high a medial posterior left frontal or parietal lobe. 3. Additional white matter changes are noted beyond the areas of  . Visual field defect 10/12/2014   Relevant past  medical, surgical, family and social history reviewed and updated as indicated. Interim medical history since our last visit reviewed. Allergies and medications reviewed and updated. DATA REVIEWED: CHART IN EPIC  Family History reviewed for pertinent findings.  Review of Systems  Constitutional: Negative.  Negative for appetite change and fatigue.  Eyes: Negative for pain and visual disturbance.  Respiratory: Negative.  Negative for cough, chest tightness, shortness of breath and wheezing.   Cardiovascular: Negative.  Negative for chest pain, palpitations and leg swelling.  Gastrointestinal: Negative.  Negative for abdominal pain, diarrhea, nausea and vomiting.  Genitourinary: Negative.   Skin: Negative.  Negative for color change and rash.  Neurological: Negative.  Negative for weakness, numbness and headaches.  Psychiatric/Behavioral: Negative.     Allergies as of 08/15/2018      Reactions   Nitroglycerin Other (See Comments)   Hypotension, syncope, bradycardia      Medication List        Accurate as of 08/15/18 11:59 PM. Always use your most recent med list.          amLODipine 5 MG tablet Commonly known as:  NORVASC TAKE 1 TABLET ONCE DAILY.   aspirin 81  MG tablet Take 81 mg by mouth daily.   atorvastatin 80 MG tablet Commonly known as:  LIPITOR TAKE 1 TABLET AT 6PM.   BLACK CHERRY CONCENTRATE PO Take 1 tablet by mouth daily as needed (gout flare-up).   blood glucose meter kit and supplies Kit Dispense based on patient and insurance preference. Use up to four times daily as directed. (FOR ICD-10 E11.65)   colchicine 0.6 MG tablet Take 1 tablet (0.6 mg total) by mouth daily.   glucose blood test strip 1 each by Other route as needed. Use as instructed 2 x daily. E11.65 One Touch Verio   HYDROcodone-acetaminophen 10-325 MG tablet Commonly known as:  NORCO Take 1 tablet by mouth every 4 (four) hours as needed for moderate pain.   LEVEMIR FLEXTOUCH 100 UNIT/ML  Pen Generic drug:  Insulin Detemir INJECT 50 UNITS SUBCUTANEOUSLY ONCE A DAY AT 10PM.   liraglutide 18 MG/3ML Sopn Commonly known as:  VICTOZA INJECT 0.2ML (1.'2MG'$ ) INTO THE SKIN DAILY   losartan 100 MG tablet Commonly known as:  COZAAR Take 100 mg by mouth daily.   metFORMIN 1000 MG tablet Commonly known as:  GLUCOPHAGE TAKE 1 TABLET BY MOUTH TWICE DAILY WITH FOOD.   metoprolol 200 MG 24 hr tablet Commonly known as:  TOPROL-XL TAKE 1 TABLET ONCE DAILY.   predniSONE 10 MG tablet Commonly known as:  DELTASONE Take 1 tablet (10 mg total) by mouth daily with breakfast. For gout flare up   spironolactone 25 MG tablet Commonly known as:  ALDACTONE TAKE (1/2) TABLET BY MOUTH DAILY.   torsemide 20 MG tablet Commonly known as:  DEMADEX TAKE 2 TABLETS BY MOUTH TWICE DAILY.   traMADol 50 MG tablet Commonly known as:  ULTRAM Take 1-2 tablets (50-100 mg total) by mouth every 12 (twelve) hours as needed.          Objective:    BP (!) 193/98   Pulse (!) 102   Temp 97.9 F (36.6 C) (Oral)   Ht '5\' 11"'$  (1.803 m)   Wt (!) 355 lb 9.6 oz (161.3 kg)   BMI 49.60 kg/m   Allergies  Allergen Reactions  . Nitroglycerin Other (See Comments)    Hypotension, syncope, bradycardia    Wt Readings from Last 3 Encounters:  08/15/18 (!) 355 lb 9.6 oz (161.3 kg)  07/24/18 (!) 354 lb 6.4 oz (160.8 kg)  02/05/18 (!) 370 lb (167.8 kg)    Physical Exam  Constitutional: He appears well-developed and well-nourished. No distress.  HENT:  Head: Normocephalic and atraumatic.  Eyes: Pupils are equal, round, and reactive to light. Conjunctivae and EOM are normal.  Cardiovascular: Normal rate, regular rhythm and normal heart sounds.  Pulmonary/Chest: Effort normal and breath sounds normal. No respiratory distress.  Musculoskeletal:       Right wrist: He exhibits decreased range of motion, swelling and deformity.       Arms: Skin: Skin is warm and dry.  Psychiatric: He has a normal mood and  affect. His behavior is normal.  Nursing note and vitals reviewed.   Results for orders placed or performed in visit on 07/24/18  Bayer DCA Hb A1c Waived  Result Value Ref Range   HB A1C (BAYER DCA - WAIVED) >14.0 (H) <7.0 %  CMP14+EGFR  Result Value Ref Range   Glucose 348 (H) 65 - 99 mg/dL   BUN 16 6 - 24 mg/dL   Creatinine, Ser 0.95 0.76 - 1.27 mg/dL   GFR calc non Af Amer 96 >59  mL/min/1.73   GFR calc Af Amer 111 >59 mL/min/1.73   BUN/Creatinine Ratio 17 9 - 20   Sodium 135 134 - 144 mmol/L   Potassium 4.6 3.5 - 5.2 mmol/L   Chloride 94 (L) 96 - 106 mmol/L   CO2 24 20 - 29 mmol/L   Calcium 9.3 8.7 - 10.2 mg/dL   Total Protein 6.7 6.0 - 8.5 g/dL   Albumin 3.8 3.5 - 5.5 g/dL   Globulin, Total 2.9 1.5 - 4.5 g/dL   Albumin/Globulin Ratio 1.3 1.2 - 2.2   Bilirubin Total 0.7 0.0 - 1.2 mg/dL   Alkaline Phosphatase 248 (H) 39 - 117 IU/L   AST 20 0 - 40 IU/L   ALT 32 0 - 44 IU/L  CBC with Differential/Platelet  Result Value Ref Range   WBC 6.7 3.4 - 10.8 x10E3/uL   RBC 5.46 4.14 - 5.80 x10E6/uL   Hemoglobin 15.9 13.0 - 17.7 g/dL   Hematocrit 46.5 37.5 - 51.0 %   MCV 85 79 - 97 fL   MCH 29.1 26.6 - 33.0 pg   MCHC 34.2 31.5 - 35.7 g/dL   RDW 12.0 (L) 12.3 - 15.4 %   Platelets 193 150 - 450 x10E3/uL   Neutrophils 76 Not Estab. %   Lymphs 14 Not Estab. %   Monocytes 6 Not Estab. %   Eos 3 Not Estab. %   Basos 0 Not Estab. %   Neutrophils Absolute 5.1 1.4 - 7.0 x10E3/uL   Lymphocytes Absolute 0.9 0.7 - 3.1 x10E3/uL   Monocytes Absolute 0.4 0.1 - 0.9 x10E3/uL   EOS (ABSOLUTE) 0.2 0.0 - 0.4 x10E3/uL   Basophils Absolute 0.0 0.0 - 0.2 x10E3/uL   Immature Granulocytes 1 Not Estab. %   Immature Grans (Abs) 0.1 0.0 - 0.1 x10E3/uL  Lipid panel  Result Value Ref Range   Cholesterol, Total 175 100 - 199 mg/dL   Triglycerides 165 (H) 0 - 149 mg/dL   HDL 27 (L) >39 mg/dL   VLDL Cholesterol Cal 33 5 - 40 mg/dL   LDL Calculated 115 (H) 0 - 99 mg/dL   Chol/HDL Ratio 6.5 (H) 0.0 -  5.0 ratio  ToxASSURE Select 13 (MW), Urine  Result Value Ref Range   Summary FINAL       Assessment & Plan:   1. Hypertension associated with diabetes ([place]) Follow-up PCP Recheck blood pressure daily  2. Arm swelling - predniSONE (DELTASONE) 10 MG tablet; Take 1 tablet (10 mg total) by mouth daily with breakfast. For gout flare up  Dispense: 30 tablet; Refill: 0 - colchicine 0.6 MG tablet; Take 1 tablet (0.6 mg total) by mouth daily.  Dispense: 20 tablet; Refill: 0 - HYDROcodone-acetaminophen (NORCO) 10-325 MG tablet; Take 1 tablet by mouth every 4 (four) hours as needed for moderate pain.  Dispense: 30 tablet; Refill: 0  3. Acute gout of right wrist, unspecified cause - predniSONE (DELTASONE) 10 MG tablet; Take 1 tablet (10 mg total) by mouth daily with breakfast. For gout flare up  Dispense: 30 tablet; Refill: 0 - colchicine 0.6 MG tablet; Take 1 tablet (0.6 mg total) by mouth daily.  Dispense: 20 tablet; Refill: 0 - HYDROcodone-acetaminophen (NORCO) 10-325 MG tablet; Take 1 tablet by mouth every 4 (four) hours as needed for moderate pain.  Dispense: 30 tablet; Refill: 0    Continue all other maintenance medications as listed above.  Follow up plan: No follow-ups on file.  Educational handout given for survey  Terald Sleeper PA-C [facility] [address]  [address], [place] [address] [phone]   08/16/2018, 1:29 PM

## 2018-09-18 ENCOUNTER — Other Ambulatory Visit: Payer: Self-pay | Admitting: Cardiology

## 2018-09-22 ENCOUNTER — Other Ambulatory Visit: Payer: Self-pay | Admitting: Physician Assistant

## 2018-09-22 DIAGNOSIS — M109 Gout, unspecified: Secondary | ICD-10-CM

## 2018-09-22 DIAGNOSIS — M7989 Other specified soft tissue disorders: Secondary | ICD-10-CM

## 2018-10-24 ENCOUNTER — Encounter: Payer: Self-pay | Admitting: Family

## 2018-10-24 ENCOUNTER — Ambulatory Visit (INDEPENDENT_AMBULATORY_CARE_PROVIDER_SITE_OTHER): Payer: Medicare Other | Admitting: Family

## 2018-10-24 VITALS — BP 178/106 | HR 101 | Temp 98.4°F | Ht 71.0 in | Wt 352.4 lb

## 2018-10-24 DIAGNOSIS — Z9119 Patient's noncompliance with other medical treatment and regimen: Secondary | ICD-10-CM

## 2018-10-24 DIAGNOSIS — M7989 Other specified soft tissue disorders: Secondary | ICD-10-CM

## 2018-10-24 DIAGNOSIS — M159 Polyosteoarthritis, unspecified: Secondary | ICD-10-CM

## 2018-10-24 DIAGNOSIS — Z0289 Encounter for other administrative examinations: Secondary | ICD-10-CM

## 2018-10-24 DIAGNOSIS — M150 Primary generalized (osteo)arthritis: Secondary | ICD-10-CM

## 2018-10-24 DIAGNOSIS — E118 Type 2 diabetes mellitus with unspecified complications: Secondary | ICD-10-CM | POA: Diagnosis not present

## 2018-10-24 DIAGNOSIS — Z951 Presence of aortocoronary bypass graft: Secondary | ICD-10-CM

## 2018-10-24 DIAGNOSIS — F1120 Opioid dependence, uncomplicated: Secondary | ICD-10-CM

## 2018-10-24 DIAGNOSIS — E1159 Type 2 diabetes mellitus with other circulatory complications: Secondary | ICD-10-CM | POA: Diagnosis not present

## 2018-10-24 DIAGNOSIS — E785 Hyperlipidemia, unspecified: Secondary | ICD-10-CM

## 2018-10-24 DIAGNOSIS — I10 Essential (primary) hypertension: Secondary | ICD-10-CM

## 2018-10-24 DIAGNOSIS — I5043 Acute on chronic combined systolic (congestive) and diastolic (congestive) heart failure: Secondary | ICD-10-CM | POA: Diagnosis not present

## 2018-10-24 DIAGNOSIS — J019 Acute sinusitis, unspecified: Secondary | ICD-10-CM

## 2018-10-24 DIAGNOSIS — E1169 Type 2 diabetes mellitus with other specified complication: Secondary | ICD-10-CM

## 2018-10-24 DIAGNOSIS — M109 Gout, unspecified: Secondary | ICD-10-CM

## 2018-10-24 DIAGNOSIS — I152 Hypertension secondary to endocrine disorders: Secondary | ICD-10-CM

## 2018-10-24 DIAGNOSIS — IMO0002 Reserved for concepts with insufficient information to code with codable children: Secondary | ICD-10-CM

## 2018-10-24 DIAGNOSIS — E1165 Type 2 diabetes mellitus with hyperglycemia: Secondary | ICD-10-CM

## 2018-10-24 DIAGNOSIS — Z91199 Patient's noncompliance with other medical treatment and regimen due to unspecified reason: Secondary | ICD-10-CM

## 2018-10-24 LAB — BAYER DCA HB A1C WAIVED: HB A1C (BAYER DCA - WAIVED): >14 % — ABNORMAL HIGH (ref <7.0)

## 2018-10-24 MED ORDER — COLCHICINE 0.6 MG PO TABS: 0.6000 mg | ORAL_TABLET | Freq: Every day | ORAL | 0 refills | Status: DC

## 2018-10-24 MED ORDER — INSULIN DETEMIR 100 UNIT/ML FLEXPEN: PEN_INJECTOR | SUBCUTANEOUS | 3 refills | Status: DC

## 2018-10-24 MED ORDER — AMOXICILLIN-POT CLAVULANATE 875-125 MG PO TABS: 1.0000 | ORAL_TABLET | Freq: Two times a day (BID) | ORAL | 0 refills | Status: DC

## 2018-10-24 MED ORDER — LIRAGLUTIDE 18 MG/3ML ~~LOC~~ SOPN: PEN_INJECTOR | SUBCUTANEOUS | 3 refills | Status: DC

## 2018-10-24 MED ORDER — TRAMADOL HCL 50 MG PO TABS: 50.0000 mg | ORAL_TABLET | Freq: Two times a day (BID) | ORAL | 2 refills | Status: DC | PRN

## 2018-10-24 MED ORDER — ALLOPURINOL 100 MG PO TABS: 100.0000 mg | ORAL_TABLET | Freq: Every day | ORAL | 6 refills | Status: DC

## 2018-10-24 NOTE — Progress Notes (Signed)
Subjective:    Patient ID: Mark Leach, male    DOB: 23-May-1971, 48 y.o.   MRN: [id number] Chief Complaint  Patient presents with  . Medical Management of Chronic Issues    diabetes   Pt presents to the office today for chronic follow up. Pt is followed by his Cardiologists every 3 month for CHF, HX of CABG, and HX of stroke and MI. Pt states he is suppose to weight himself daily, but he is noncompliant.  PT is followed by Endo for uncontrolled DM2.States he missed his last appt and never rescheduled.  Pt is noncompliant with his diet and medications.  States he has missed both his Cardiologists and Endocrinologists appointments because of transportation issues. He states he has not been taking his medications regularly, but did take his blood pressure medications this morning.  Diabetes  He presents for his follow-up diabetic visit. He has type 2 diabetes mellitus. His disease course has been worsening. Hypoglycemia symptoms include headaches. Pertinent negatives for diabetes include no blurred vision, no foot paresthesias and no visual change. Symptoms are stable. Diabetic complications include a CVA and heart disease. Risk factors for coronary artery disease include diabetes mellitus, dyslipidemia, male sex, hypertension and sedentary lifestyle. He is following a generally unhealthy diet. (Does not check Blood glucose )  Hypertension  This is a chronic problem. The current episode started more than 1 year ago. The problem has been waxing and waning since onset. The problem is uncontrolled. Associated symptoms include headaches, malaise/fatigue, peripheral edema and shortness of breath. Pertinent negatives include no blurred vision. Risk factors for coronary artery disease include dyslipidemia, diabetes mellitus, obesity and male gender. Treatments tried: has not taken BP medications regularly, states it "has been awhile" The current treatment provides no improvement. Hypertensive  end-organ damage includes CVA.  Sinusitis  This is a new problem. The current episode started 1 to 4 weeks ago. The problem has been gradually worsening since onset. There has been no fever. His pain is at a severity of 5/10. The pain is moderate. Associated symptoms include congestion, headaches, a hoarse voice, shortness of breath and sinus pressure. Pertinent negatives include no ear pain. Past treatments include oral decongestants. The treatment provided mild relief.  Arthritis  Presents for follow-up visit. He complains of pain and stiffness. The symptoms have been worsening. Affected locations include the right knee and left knee. His pain is at a severity of 1/10.  Hyperlipidemia  This is a chronic problem. The current episode started more than 1 year ago. The problem is uncontrolled. Recent lipid tests were reviewed and are high. Exacerbating diseases include obesity. Associated symptoms include shortness of breath. Treatments tried: not taking statin. The current treatment provides no improvement of lipids. Risk factors for coronary artery disease include diabetes mellitus, dyslipidemia, family history, male sex, hypertension and obesity.      Review of Systems  Constitutional: Positive for malaise/fatigue.  HENT: Positive for congestion, hoarse voice and sinus pressure. Negative for ear pain.   Eyes: Negative for blurred vision.  Respiratory: Positive for shortness of breath.   Musculoskeletal: Positive for arthritis and stiffness.  Neurological: Positive for headaches.  All other systems reviewed and are negative.      Objective:   Physical Exam Vitals signs reviewed.  Constitutional:      General: He is not in acute distress.    Appearance: He is well-developed. He is ill-appearing.  HENT:     Head: Normocephalic.     Nose: Mucosal  edema present.     Right Sinus: Maxillary sinus tenderness present.     Left Sinus: Maxillary sinus tenderness present.     Mouth/Throat:      Pharynx: Posterior oropharyngeal erythema present.  Eyes:     General:        Right eye: No discharge.        Left eye: No discharge.     Pupils: Pupils are equal, round, and reactive to light.  Neck:     Musculoskeletal: Normal range of motion and neck supple.     Thyroid: No thyromegaly.  Cardiovascular:     Rate and Rhythm: Normal rate and regular rhythm.     Heart sounds: Normal heart sounds. No murmur.  Pulmonary:     Effort: Pulmonary effort is normal. No respiratory distress.     Breath sounds: Wheezing present.  Abdominal:     General: Bowel sounds are normal. There is no distension.     Palpations: Abdomen is soft.     Tenderness: There is no abdominal tenderness.  Musculoskeletal:        General: Swelling present. No tenderness.     Right lower leg: Edema (3+) present.     Left lower leg: Edema (3+) present.  Skin:    General: Skin is warm and dry.     Findings: No erythema or rash.  Neurological:     Mental Status: He is alert and oriented to person, place, and time.     Cranial Nerves: No cranial nerve deficit.     Deep Tendon Reflexes: Reflexes are normal and symmetric.  Psychiatric:        Behavior: Behavior normal.        Thought Content: Thought content normal.        Judgment: Judgment normal.       BP (!) 178/106 Comment: Not taking medication regularly.  Pulse (!) 101   Temp 98.4 F (36.9 C) (Oral)   Ht '5\' 11"'$  (1.803 m)   Wt (!) 352 lb 6.4 oz (159.8 kg)   BMI 49.15 kg/m      Assessment & Plan:  Mark Leach comes in today with chief complaint of Medical Management of Chronic Issues (diabetes)   Diagnosis and orders addressed:  1. Hypertension associated with diabetes ([place]) - CMP14+EGFR - CBC with Differential/Platelet - Referral to Chronic Care Management Services  2. Acute on chronic combined systolic and diastolic heart failure (H[facility]) - CMP14+EGFR - CBC with Differential/Platelet - Referral to Chronic Care Management Services  3.  DM type 2 causing vascular disease ([place]) - Bayer DCA Hb A1c Waived - CMP14+EGFR - CBC with Differential/Platelet - Microalbumin / creatinine urine ratio - Referral to Chronic Care Management Services  4. Essential hypertension, benign - CMP14+EGFR - CBC with Differential/Platelet - Referral to Chronic Care Management Services  5. Diabetes mellitus type 2, uncontrolled, with complications ([place]) - SWF09+NATF - CBC with Differential/Platelet - Referral to Chronic Care Management Services  6. Primary osteoarthritis involving multiple joints - traMADol (ULTRAM) 50 MG tablet; Take 1-2 tablets (50-100 mg total) by mouth every 12 (twelve) hours as needed.  Dispense: 120 tablet; Refill: 2 - CMP14+EGFR - CBC with Differential/Platelet - Referral to Chronic Care Management Services  7. Hyperlipidemia associated with type 2 diabetes mellitus ([place]) - CMP14+EGFR - CBC with Differential/Platelet - Referral to Chronic Care Management Services  8. Morbid obesity ([place]) - CMP14+EGFR - CBC with Differential/Platelet - Referral to Chronic Care Management Services  9. S/P CABG x  4 - CMP14+EGFR - CBC with Differential/Platelet - Referral to Chronic Care Management Services  10. Noncompliance - CMP14+EGFR - CBC with Differential/Platelet - Referral to Chronic Care Management Services  11. Uncomplicated opioid dependence ([facility]) - traMADol (ULTRAM) 50 MG tablet; Take 1-2 tablets (50-100 mg total) by mouth every 12 (twelve) hours as needed.  Dispense: 120 tablet; Refill: 2 - CMP14+EGFR - CBC with Differential/Platelet - Referral to Chronic Care Management Services  12. Pain management contract signed - traMADol (ULTRAM) 50 MG tablet; Take 1-2 tablets (50-100 mg total) by mouth every 12 (twelve) hours as needed.  Dispense: 120 tablet; Refill: 2 - CMP14+EGFR - CBC with Differential/Platelet - Referral to Chronic Care Management Services  13. Acute sinusitis, recurrence not specified,  unspecified location - amoxicillin-clavulanate (AUGMENTIN) 875-125 MG tablet; Take 1 tablet by mouth 2 (two) times daily.  Dispense: 14 tablet; Refill: 0 - Referral to Chronic Care Management Services   Labs pending Health Maintenance reviewed Diet and exercise encouraged  Follow up plan: 1 month, he will restart his medications and follow up.    Evelina Dun, FNP

## 2018-10-24 NOTE — Patient Instructions (Signed)
Sinusitis, Adult  Sinusitis is inflammation of your sinuses. Sinuses are hollow spaces in the bones around your face. Your sinuses are located:   Around your eyes.   In the middle of your forehead.   Behind your nose.   In your cheekbones.  Mucus normally drains out of your sinuses. When your nasal tissues become inflamed or swollen, mucus can become trapped or blocked. This allows bacteria, viruses, and fungi to grow, which leads to infection. Most infections of the sinuses are caused by a virus.  Sinusitis can develop quickly. It can last for up to 4 weeks (acute) or for more than 12 weeks (chronic). Sinusitis often develops after a cold.  What are the causes?  This condition is caused by anything that creates swelling in the sinuses or stops mucus from draining. This includes:   Allergies.   Asthma.   Infection from bacteria or viruses.   Deformities or blockages in your nose or sinuses.   Abnormal growths in the nose (nasal polyps).   Pollutants, such as chemicals or irritants in the air.   Infection from fungi (rare).  What increases the risk?  You are more likely to develop this condition if you:   Have a weak body defense system (immune system).   Do a lot of swimming or diving.   Overuse nasal sprays.   Smoke.  What are the signs or symptoms?  The main symptoms of this condition are pain and a feeling of pressure around the affected sinuses. Other symptoms include:   Stuffy nose or congestion.   Thick drainage from your nose.   Swelling and warmth over the affected sinuses.   Headache.   Upper toothache.   A cough that may get worse at night.   Extra mucus that collects in the throat or the back of the nose (postnasal drip).   Decreased sense of smell and taste.   Fatigue.   A fever.   Sore throat.   Bad breath.  How is this diagnosed?  This condition is diagnosed based on:   Your symptoms.   Your medical history.   A physical exam.   Tests to find out if your condition is  acute or chronic. This may include:  ? Checking your nose for nasal polyps.  ? Viewing your sinuses using a device that has a light (endoscope).  ? Testing for allergies or bacteria.  ? Imaging tests, such as an MRI or CT scan.  In rare cases, a bone biopsy may be done to rule out more serious types of fungal sinus disease.  How is this treated?  Treatment for sinusitis depends on the cause and whether your condition is chronic or acute.   If caused by a virus, your symptoms should go away on their own within 10 days. You may be given medicines to relieve symptoms. They include:  ? Medicines that shrink swollen nasal passages (topical intranasal decongestants).  ? Medicines that treat allergies (antihistamines).  ? A spray that eases inflammation of the nostrils (topical intranasal corticosteroids).  ? Rinses that help get rid of thick mucus in your nose (nasal saline washes).   If caused by bacteria, your health care provider may recommend waiting to see if your symptoms improve. Most bacterial infections will get better without antibiotic medicine. You may be given antibiotics if you have:  ? A severe infection.  ? A weak immune system.   If caused by narrow nasal passages or nasal polyps, you may need   to have surgery.  Follow these instructions at home:  Medicines   Take, use, or apply over-the-counter and prescription medicines only as told by your health care provider. These may include nasal sprays.   If you were prescribed an antibiotic medicine, take it as told by your health care provider. Do not stop taking the antibiotic even if you start to feel better.  Hydrate and humidify     Drink enough fluid to keep your urine pale yellow. Staying hydrated will help to thin your mucus.   Use a cool mist humidifier to keep the humidity level in your home above 50%.   Inhale steam for 10-15 minutes, 3-4 times a day, or as told by your health care provider. You can do this in the bathroom while a hot shower is  running.   Limit your exposure to cool or dry air.  Rest   Rest as much as possible.   Sleep with your head raised (elevated).   Make sure you get enough sleep each night.  General instructions     Apply a warm, moist washcloth to your face 3-4 times a day or as told by your health care provider. This will help with discomfort.   Wash your hands often with soap and water to reduce your exposure to germs. If soap and water are not available, use hand sanitizer.   Do not smoke. Avoid being around people who are smoking (secondhand smoke).   Keep all follow-up visits as told by your health care provider. This is important.  Contact a health care provider if:   You have a fever.   Your symptoms get worse.   Your symptoms do not improve within 10 days.  Get help right away if:   You have a severe headache.   You have persistent vomiting.   You have severe pain or swelling around your face or eyes.   You have vision problems.   You develop confusion.   Your neck is stiff.   You have trouble breathing.  Summary   Sinusitis is soreness and inflammation of your sinuses. Sinuses are hollow spaces in the bones around your face.   This condition is caused by nasal tissues that become inflamed or swollen. The swelling traps or blocks the flow of mucus. This allows bacteria, viruses, and fungi to grow, which leads to infection.   If you were prescribed an antibiotic medicine, take it as told by your health care provider. Do not stop taking the antibiotic even if you start to feel better.   Keep all follow-up visits as told by your health care provider. This is important.  This information is not intended to replace advice given to you by your health care provider. Make sure you discuss any questions you have with your health care provider.  Document Released: 09/26/2005 Document Revised: 02/26/2018 Document Reviewed: 02/26/2018  [employer]vier Interactive Patient Education  2019 [employer]

## 2018-10-25 LAB — CMP14+EGFR
A/G RATIO: 1.3 (ref 1.2–2.2)
ALT: 59 IU/L — ABNORMAL HIGH (ref 0–44)
AST: 33 IU/L (ref 0–40)
Albumin: 3.9 g/dL (ref 3.5–5.5)
Alkaline Phosphatase: 309 IU/L — ABNORMAL HIGH (ref 39–117)
BUN/Creatinine Ratio: 18 (ref 9–20)
BUN: 19 mg/dL (ref 6–24)
Bilirubin Total: 0.8 mg/dL (ref 0.0–1.2)
CALCIUM: 9.6 mg/dL (ref 8.7–10.2)
CO2: 23 mmol/L (ref 20–29)
CREATININE: 1.05 mg/dL (ref 0.76–1.27)
Chloride: 93 mmol/L — ABNORMAL LOW (ref 96–106)
GFR calc Af Amer: 98 mL/min/{1.73_m2} (ref >59)
GFR calc non Af Amer: 85 mL/min/{1.73_m2} (ref >59)
Globulin, Total: 3.1 g/dL (ref 1.5–4.5)
Glucose: 380 mg/dL — ABNORMAL HIGH (ref 65–99)
Potassium: 4.6 mmol/L (ref 3.5–5.2)
Sodium: 134 mmol/L (ref 134–144)
Total Protein: 7 g/dL (ref 6.0–8.5)

## 2018-10-25 LAB — CBC WITH DIFFERENTIAL/PLATELET
Basophils Absolute: 0.1 10*3/uL (ref 0.0–0.2)
Basos: 1 %
EOS (ABSOLUTE): 0.2 10*3/uL (ref 0.0–0.4)
Eos: 2 %
HEMATOCRIT: 48.1 % (ref 37.5–51.0)
Hemoglobin: 16.8 g/dL (ref 13.0–17.7)
Immature Grans (Abs): 0.1 10*3/uL (ref 0.0–0.1)
Immature Granulocytes: 1 %
Lymphocytes Absolute: 1.1 10*3/uL (ref 0.7–3.1)
Lymphs: 14 %
MCH: 30.8 pg (ref 26.6–33.0)
MCHC: 34.9 g/dL (ref 31.5–35.7)
MCV: 88 fL (ref 79–97)
Monocytes Absolute: 0.7 10*3/uL (ref 0.1–0.9)
Monocytes: 9 %
NEUTROS ABS: 5.7 10*3/uL (ref 1.4–7.0)
Neutrophils: 73 %
Platelets: 162 10*3/uL (ref 150–450)
RBC: 5.46 x10E6/uL (ref 4.14–5.80)
RDW: 12.9 % (ref 11.6–15.4)
WBC: 7.8 10*3/uL (ref 3.4–10.8)

## 2018-10-25 LAB — MICROALBUMIN / CREATININE URINE RATIO
Creatinine, Urine: 64 mg/dL
Microalb/Creat Ratio: 1409.5 mg/g creat — ABNORMAL HIGH (ref 0.0–30.0)
Microalbumin, Urine: 902.1 ug/mL

## 2018-10-26 ENCOUNTER — Ambulatory Visit: Payer: Self-pay | Admitting: Licensed Clinical Social Worker

## 2018-10-26 DIAGNOSIS — I10 Essential (primary) hypertension: Secondary | ICD-10-CM

## 2018-10-26 DIAGNOSIS — E1169 Type 2 diabetes mellitus with other specified complication: Secondary | ICD-10-CM

## 2018-10-26 DIAGNOSIS — E785 Hyperlipidemia, unspecified: Secondary | ICD-10-CM

## 2018-10-26 DIAGNOSIS — IMO0002 Reserved for concepts with insufficient information to code with codable children: Secondary | ICD-10-CM

## 2018-10-26 DIAGNOSIS — E1165 Type 2 diabetes mellitus with hyperglycemia: Secondary | ICD-10-CM

## 2018-10-26 DIAGNOSIS — E118 Type 2 diabetes mellitus with unspecified complications: Secondary | ICD-10-CM

## 2018-10-26 DIAGNOSIS — F331 Major depressive disorder, recurrent, moderate: Secondary | ICD-10-CM

## 2018-10-26 DIAGNOSIS — G4733 Obstructive sleep apnea (adult) (pediatric): Secondary | ICD-10-CM

## 2018-10-26 NOTE — Chronic Care Management (AMB) (Addendum)
  Chronic Care Management    Clinical Social Work General Note  10/26/2018 Name: Mark Leach MRN: [id number] DOB: 1971-03-04   Referred by: Evelina Dun, FNP  for assessment of psychosocial needs and support.   Mr. Doescher was given information about Chronic Care Management services today including:  1. CCM service includes personalized support from designated clinical staff supervised by his physician, including individualized plan of care and coordination with other care providers 2. 24/7 contact phone numbers for assistance for urgent and routine care needs. 3. Service will only be billed when office clinical staff spend 20 minutes or more in a month to coordinate care. 4. Only one practitioner may furnish and bill the service in a calendar month. 5. The patient may stop CCM services at any time (effective at the end of the month) by phone call to the office staff. 6. The patient will be responsible for cost sharing (co-pay) of up to 20% of the service fee (after annual deductible is met).  Patient did not agree to [facility] services but is considering. Patient agreed for LCSW to call patient in 2 weeks to further discuss CCM program services.    Review of patient status, including review of consultants reports, relevant laboratory and other test results, and collaboration with appropriate care team members and the patient's provider was performed as part of comprehensive patient evaluation and provision of chronic care management services.    Last CCM Appointment:  10/26/2018  Depression screen [facility] 2/9 10/24/2018 08/15/2018 02/05/2018  Decreased Interest 1 0 1  Down, Depressed, Hopeless '1 1 1  '$ PHQ - 2 Score '2 1 2  '$ Altered sleeping 1 - 1  Tired, decreased energy 1 - 1  Change in appetite 0 - 0  Feeling bad or failure about yourself  0 - 0  Trouble concentrating 0 - 0  Moving slowly or fidgety/restless 0 - 0  Suicidal thoughts 0 - 0  PHQ-9 Score 4 - 4  Difficult doing work/chores - - -        Follow Up Plan:   LCSW to call client in 2 weeks to further discuss CCM program services.       Norva Riffle.Tex Conroy MSW, LCSW Licensed Clinical Social Worker [facility] [phone]  I have reviewed and agree with the above documentation.   Evelina Dun, FNP

## 2018-10-26 NOTE — Patient Instructions (Signed)
Mark Leach was given information about Chronic Care Management services today including:  1. CCM service includes personalized support from designated clinical staff supervised by his physician, including individualized plan of care and coordination with other care providers 2. 24/7 contact phone numbers for assistance for urgent and routine care needs. 3. Service will only be billed when office clinical staff spend 20 minutes or more in a month to coordinate care. 4. Only one practitioner may furnish and bill the service in a calendar month. 5. The patient may stop CCM services at any time (effective at the end of the month) by phone call to the office staff. 6. The patient will be responsible for cost sharing (co-pay) of up to 20% of the service fee (after annual deductible is met).  Patient did not agree to [facility] services but is considering. Patient agreed for LCSW to call client in 2 weeks to further discuss CCM services.  Plan:  LCSW to call client in 2 weeks to discuss CCM program services.  Client to call LCSW as needed to further discuss CCM program services provision  The patient verbalized understanding of instructions provided today and declined a print copy of patient instruction materials.   Mark Leach.Mark Leach MSW, LCSW Licensed Clinical Social Worker [facility] Care [facility] [phone]

## 2018-10-30 ENCOUNTER — Ambulatory Visit: Payer: Self-pay | Admitting: *Deleted

## 2018-11-05 ENCOUNTER — Telehealth: Payer: Self-pay

## 2018-11-07 ENCOUNTER — Ambulatory Visit: Payer: Medicare Other | Admitting: Licensed Clinical Social Worker

## 2018-11-07 DIAGNOSIS — E1165 Type 2 diabetes mellitus with hyperglycemia: Secondary | ICD-10-CM

## 2018-11-07 DIAGNOSIS — E1159 Type 2 diabetes mellitus with other circulatory complications: Secondary | ICD-10-CM

## 2018-11-07 DIAGNOSIS — IMO0002 Reserved for concepts with insufficient information to code with codable children: Secondary | ICD-10-CM

## 2018-11-07 DIAGNOSIS — I10 Essential (primary) hypertension: Secondary | ICD-10-CM

## 2018-11-07 DIAGNOSIS — F331 Major depressive disorder, recurrent, moderate: Secondary | ICD-10-CM

## 2018-11-07 DIAGNOSIS — E118 Type 2 diabetes mellitus with unspecified complications: Secondary | ICD-10-CM

## 2018-11-07 NOTE — Chronic Care Management (AMB) (Signed)
  Chronic Care Management    Clinical Social Work General Note  11/07/2018 Name: Mark Leach MRN: [id number] DOB: Aug 29, 1971   Referred by: PCP, Sharion Balloon FNP for psychosocial assessment.    Mark Leach was given information about Chronic Care Management services today including:  1. CCM service includes personalized support from designated clinical staff supervised by his physician, including individualized plan of care and coordination with other care providers 2. 24/7 contact phone numbers for assistance for urgent and routine care needs. 3. Service will only be billed when office clinical staff spend 20 minutes or more in a month to coordinate care. 4. Only one practitioner may furnish and bill the service in a calendar month. 5. The patient may stop CCM services at any time (effective at the end of the month) by phone call to the office staff. 6. The patient will be responsible for cost sharing (co-pay) of up to 20% of the service fee (after annual deductible is met).  Patient did not agree to services and does not wish to consider at this time.  Review of patient status, including review of consultants reports, relevant laboratory and other test results, and collaboration with appropriate care team members and the patient's provider was performed as part of comprehensive patient evaluation and provision of chronic care management services.    Last CCM Appointment: 11/07/2018  Depression screen [facility] 2/9 10/24/2018 08/15/2018 02/05/2018  Decreased Interest 1 0 1  Down, Depressed, Hopeless '1 1 1  '$ PHQ - 2 Score '2 1 2  '$ Altered sleeping 1 - 1  Tired, decreased energy 1 - 1  Change in appetite 0 - 0  Feeling bad or failure about yourself  0 - 0  Trouble concentrating 0 - 0  Moving slowly or fidgety/restless 0 - 0  Suicidal thoughts 0 - 0  PHQ-9 Score 4 - 4  Difficult doing work/chores - - -     Follow Up Plan: Client to attend medical appointments with Sharion Balloon, FNP as  scheduled.       Norva Riffle.Mark Leach MSW, LCSW Licensed Clinical Social Worker [facility] [phone]

## 2018-11-07 NOTE — Patient Instructions (Signed)
Mark Leach was given information about Chronic Care Management services today including:  1. CCM service includes personalized support from designated clinical staff supervised by his physician, including individualized plan of care and coordination with other care providers 2. 24/7 contact phone numbers for assistance for urgent and routine care needs. 3. Service will only be billed when office clinical staff spend 20 minutes or more in a month to coordinate care. 4. Only one practitioner may furnish and bill the service in a calendar month. 5. The patient may stop CCM services at any time (effective at the end of the month) by phone call to the office staff. 6. The patient will be responsible for cost sharing (co-pay) of up to 20% of the service fee (after annual deductible is met).  Patient did not agree to services and does not wish to consider at this time.   Follow Up Plan: Client to attend medical appointments as scheduled with Sharion Balloon, FNP.  The patient verbalized understanding of instructions provided today and declined a print copy of patient instruction materials.   Norva Riffle.Ugochi Henzler MSW, LCSW Licensed Clinical Social Worker [facility] [phone]

## 2018-11-08 ENCOUNTER — Telehealth: Payer: Medicare Other

## 2018-11-09 NOTE — Progress Notes (Signed)
Erroneous encounter

## 2018-11-25 ENCOUNTER — Other Ambulatory Visit: Payer: Self-pay | Admitting: Family

## 2018-11-25 ENCOUNTER — Other Ambulatory Visit: Payer: Self-pay | Admitting: Cardiology

## 2018-11-25 DIAGNOSIS — I10 Essential (primary) hypertension: Secondary | ICD-10-CM

## 2018-12-04 ENCOUNTER — Encounter: Payer: Self-pay | Admitting: Family

## 2018-12-04 ENCOUNTER — Ambulatory Visit (INDEPENDENT_AMBULATORY_CARE_PROVIDER_SITE_OTHER): Payer: Medicare Other | Admitting: Family

## 2018-12-04 VITALS — BP 149/90 | HR 87 | Temp 98.0°F | Ht 71.0 in | Wt 350.6 lb

## 2018-12-04 DIAGNOSIS — E1159 Type 2 diabetes mellitus with other circulatory complications: Secondary | ICD-10-CM

## 2018-12-04 DIAGNOSIS — M150 Primary generalized (osteo)arthritis: Secondary | ICD-10-CM

## 2018-12-04 DIAGNOSIS — Z91199 Patient's noncompliance with other medical treatment and regimen due to unspecified reason: Secondary | ICD-10-CM

## 2018-12-04 DIAGNOSIS — F331 Major depressive disorder, recurrent, moderate: Secondary | ICD-10-CM | POA: Diagnosis not present

## 2018-12-04 DIAGNOSIS — Z9119 Patient's noncompliance with other medical treatment and regimen: Secondary | ICD-10-CM

## 2018-12-04 DIAGNOSIS — G4733 Obstructive sleep apnea (adult) (pediatric): Secondary | ICD-10-CM | POA: Diagnosis not present

## 2018-12-04 DIAGNOSIS — M159 Polyosteoarthritis, unspecified: Secondary | ICD-10-CM

## 2018-12-04 DIAGNOSIS — Z23 Encounter for immunization: Secondary | ICD-10-CM | POA: Diagnosis not present

## 2018-12-04 LAB — CMP14+EGFR
ALT: 28 IU/L (ref 0–44)
AST: 20 IU/L (ref 0–40)
Albumin/Globulin Ratio: 1.4 (ref 1.2–2.2)
Albumin: 3.9 g/dL — ABNORMAL LOW (ref 4.0–5.0)
Alkaline Phosphatase: 162 IU/L — ABNORMAL HIGH (ref 39–117)
BUN/Creatinine Ratio: 14 (ref 9–20)
BUN: 16 mg/dL (ref 6–24)
Bilirubin Total: 0.8 mg/dL (ref 0.0–1.2)
CO2: 23 mmol/L (ref 20–29)
Calcium: 9.4 mg/dL (ref 8.7–10.2)
Chloride: 98 mmol/L (ref 96–106)
Creatinine, Ser: 1.13 mg/dL (ref 0.76–1.27)
GFR calc Af Amer: 90 mL/min/{1.73_m2} (ref >59)
GFR calc non Af Amer: 78 mL/min/{1.73_m2} (ref >59)
Globulin, Total: 2.7 g/dL (ref 1.5–4.5)
Glucose: 295 mg/dL — ABNORMAL HIGH (ref 65–99)
Potassium: 5 mmol/L (ref 3.5–5.2)
Sodium: 136 mmol/L (ref 134–144)
Total Protein: 6.6 g/dL (ref 6.0–8.5)

## 2018-12-04 NOTE — Progress Notes (Signed)
Subjective:    Patient ID: Mark Leach, male    DOB: [birth year], 48 y.o.   MRN: [id number]  Chief Complaint  Patient presents with  . Hypertension    one month recheck   PT presents to the office today to recheck HTN. Pt was seen 10/24/18 and had not taken any of his medications for several weeks. He was told to restart all of his medications and follow up. He is noncompliant.    He has not followed up with Cardiologists or Endocrinologists yet. States he will.  Hypertension  This is a chronic problem. The current episode started more than 1 year ago. The problem has been waxing and waning since onset. The problem is uncontrolled. Associated symptoms include peripheral edema and shortness of breath. Pertinent negatives include no blurred vision, headaches or malaise/fatigue. Risk factors for coronary artery disease include diabetes mellitus, dyslipidemia, obesity, male gender and family history. Hypertensive end-organ damage includes kidney disease, CAD/MI and heart failure.  Diabetes  He presents for his follow-up diabetic visit. He has type 2 diabetes mellitus. His disease course has been stable. There are no hypoglycemic associated symptoms. Pertinent negatives for hypoglycemia include no headaches. Associated symptoms include fatigue and foot paresthesias. Pertinent negatives for diabetes include no blurred vision and no visual change. There are no hypoglycemic complications. Symptoms are stable. Risk factors for coronary artery disease include dyslipidemia, diabetes mellitus, male sex, hypertension, obesity and family history. He is following a generally unhealthy diet. (Does not check BS at home ) Eye exam is not current.  Arthritis  Presents for follow-up visit. He complains of pain and stiffness. The symptoms have been stable. Affected locations include the right knee and left knee. Associated symptoms include fatigue.  Depression         This is a chronic problem.  The current  episode started more than 1 year ago.   The onset quality is gradual.   The problem occurs intermittently.  The problem has been waxing and waning since onset.  Associated symptoms include fatigue, helplessness, hopelessness, irritable, restlessness, decreased interest and sad.  Associated symptoms include no headaches. OSA States he uses his CPAP nightly. Stable.     Review of Systems  Constitutional: Positive for fatigue. Negative for malaise/fatigue.  Eyes: Negative for blurred vision.  Respiratory: Positive for shortness of breath.   Musculoskeletal: Positive for arthritis and stiffness.  Neurological: Negative for headaches.  Psychiatric/Behavioral: Positive for depression.  All other systems reviewed and are negative.      Objective:   Physical Exam Vitals signs reviewed.  Constitutional:      General: He is irritable. He is not in acute distress.    Appearance: He is well-developed. He is obese.  HENT:     Head: Normocephalic.     Right Ear: Tympanic membrane normal.     Left Ear: Tympanic membrane normal.  Eyes:     General:        Right eye: No discharge.        Left eye: No discharge.     Pupils: Pupils are equal, round, and reactive to light.  Neck:     Musculoskeletal: Normal range of motion and neck supple.     Thyroid: No thyromegaly.  Cardiovascular:     Rate and Rhythm: Regular rhythm.     Heart sounds: Normal heart sounds. No murmur.  Pulmonary:     Effort: Pulmonary effort is normal. No respiratory distress.     Breath sounds: Normal  breath sounds. No wheezing.  Abdominal:     General: Bowel sounds are normal. There is no distension.     Palpations: Abdomen is soft.     Tenderness: There is no abdominal tenderness.  Musculoskeletal:        General: No tenderness.     Right lower leg: Edema (3+) present.     Left lower leg: Edema present.  Skin:    General: Skin is warm and dry.     Findings: No erythema or rash.  Neurological:     Mental Status:  He is alert and oriented to person, place, and time.     Cranial Nerves: No cranial nerve deficit.     Deep Tendon Reflexes: Reflexes are normal and symmetric.  Psychiatric:        Behavior: Behavior normal.        Thought Content: Thought content normal.        Judgment: Judgment normal.       BP (!) 149/90   Pulse 87   Temp 98 F (36.7 C) (Oral)   Ht _0  (1.803 m)   Wt (!) 350 lb 9.6 oz (159 kg)   BMI 48.90 kg/m      Assessment & Plan:  Mark Leach comes in today with chief complaint of Hypertension (one month recheck)   Diagnosis and orders addressed:  1. DM type 2 causing vascular disease ([place]) - CMP14+EGFR  2. Obstructive sleep apnea - CMP14+EGFR  3. Primary osteoarthritis involving multiple joints - CMP14+EGFR  4. Moderate episode of recurrent major depressive disorder (HCC) - CMP14+EGFR  5. Morbid obesity ([place]) - CMP14+EGFR  6. Noncompliance - CMP14+EGFR   Labs pending Health Maintenance reviewed Diet and exercise encouraged  Follow up plan: 1 months, and he will call Endocrinologists and Cardiologists    Evelina Dun, FNP

## 2018-12-04 NOTE — Addendum Note (Signed)
Addended by: Almeta Monas on: 12/04/2018 09:42 AM   Modules accepted: Orders

## 2018-12-04 NOTE — Patient Instructions (Signed)

## 2018-12-06 ENCOUNTER — Other Ambulatory Visit: Payer: Self-pay | Admitting: Cardiology

## 2018-12-06 ENCOUNTER — Other Ambulatory Visit: Payer: Self-pay | Admitting: *Deleted

## 2018-12-06 MED ORDER — TORSEMIDE 20 MG PO TABS: 40.0000 mg | ORAL_TABLET | Freq: Two times a day (BID) | ORAL | 0 refills | Status: DC

## 2018-12-12 ENCOUNTER — Other Ambulatory Visit: Payer: Self-pay | Admitting: Cardiology

## 2018-12-20 DIAGNOSIS — E113293 Type 2 diabetes mellitus with mild nonproliferative diabetic retinopathy without macular edema, bilateral: Secondary | ICD-10-CM | POA: Diagnosis not present

## 2018-12-20 LAB — HM DIABETES EYE EXAM

## 2018-12-28 ENCOUNTER — Other Ambulatory Visit: Payer: Self-pay | Admitting: Family

## 2018-12-28 DIAGNOSIS — I10 Essential (primary) hypertension: Secondary | ICD-10-CM

## 2019-01-01 ENCOUNTER — Encounter: Payer: Medicare Other | Admitting: Family

## 2019-01-03 ENCOUNTER — Ambulatory Visit: Payer: Self-pay | Admitting: Internal Medicine

## 2019-01-14 ENCOUNTER — Ambulatory Visit: Payer: Self-pay | Admitting: Cardiology

## 2019-01-18 ENCOUNTER — Other Ambulatory Visit: Payer: Self-pay | Admitting: Cardiology

## 2019-01-30 ENCOUNTER — Other Ambulatory Visit: Payer: Self-pay | Admitting: *Deleted

## 2019-01-30 MED ORDER — LIRAGLUTIDE 18 MG/3ML ~~LOC~~ SOPN: PEN_INJECTOR | SUBCUTANEOUS | 0 refills | Status: DC

## 2019-02-07 ENCOUNTER — Ambulatory Visit: Payer: Medicare Other | Admitting: Family

## 2019-02-08 ENCOUNTER — Other Ambulatory Visit: Payer: Self-pay

## 2019-02-13 ENCOUNTER — Ambulatory Visit: Payer: Medicare Other | Admitting: Family

## 2019-02-20 ENCOUNTER — Other Ambulatory Visit: Payer: Self-pay | Admitting: Family

## 2019-02-20 ENCOUNTER — Other Ambulatory Visit: Payer: Self-pay | Admitting: Cardiology

## 2019-02-27 ENCOUNTER — Other Ambulatory Visit: Payer: Self-pay | Admitting: Family

## 2019-02-28 ENCOUNTER — Other Ambulatory Visit: Payer: Self-pay | Admitting: Family

## 2019-02-28 ENCOUNTER — Other Ambulatory Visit: Payer: Self-pay | Admitting: Cardiology

## 2019-02-28 DIAGNOSIS — I10 Essential (primary) hypertension: Secondary | ICD-10-CM

## 2019-03-13 ENCOUNTER — Ambulatory Visit (INDEPENDENT_AMBULATORY_CARE_PROVIDER_SITE_OTHER): Payer: Medicare Other | Admitting: *Deleted

## 2019-03-13 ENCOUNTER — Telehealth: Payer: Self-pay | Admitting: Cardiology

## 2019-03-13 ENCOUNTER — Other Ambulatory Visit: Payer: Self-pay

## 2019-03-13 ENCOUNTER — Telehealth: Payer: Self-pay | Admitting: Family

## 2019-03-13 ENCOUNTER — Encounter: Payer: Self-pay | Admitting: *Deleted

## 2019-03-13 DIAGNOSIS — Z0000 Encounter for general adult medical examination without abnormal findings: Secondary | ICD-10-CM

## 2019-03-13 NOTE — Patient Instructions (Signed)
  Mark Leach , Thank you for taking time to talk with me for your Medicare Wellness Visit. I appreciate your ongoing commitment to your health goals. Please review the following plan we discussed and let me know if I can assist you in the future.   These are the goals we discussed: Goals    . Patient Stated     Work on controlling diabetes and gout by eating a low carbohydrate and low purine diet.      Please review the information given on Advance Directives.  If you complete the paperwork, please bring a copy to our office to be filed in your medical record.   This is a list of the screening recommended for you and due dates:  Health Maintenance  Topic Date Due  . Hemoglobin A1C  04/24/2019  . Flu Shot  05/11/2019  . Complete foot exam   12/05/2019  . Eye exam for diabetics  12/20/2019  . Tetanus Vaccine  02/21/2026  . Pneumococcal vaccine  Completed  . HIV Screening  Completed

## 2019-03-13 NOTE — Telephone Encounter (Signed)
Virtual Visit Pre-Appointment Phone Call  "(Name), I am calling you today to discuss your upcoming appointment. We are currently trying to limit exposure to the virus that causes COVID-19 by seeing patients at home rather than in the office."  1. "What is the BEST phone number to call the day of the visit?" - include this in appointment notes  2. Do you have or have access to (through a family member/friend) a smartphone with video capability that we can use for your visit?" a. If yes - list this number in appt notes as cell (if different from BEST phone #) and list the appointment type as a VIDEO visit in appointment notes b. If no - list the appointment type as a PHONE visit in appointment notes  3. Confirm consent - "In the setting of the current Covid19 crisis, you are scheduled for a (phone or video) visit with your provider on (date) at (time).  Just as we do with many in-office visits, in order for you to participate in this visit, we must obtain consent.  If you'd like, I can send this to your mychart (if signed up) or email for you to review.  Otherwise, I can obtain your verbal consent now.  All virtual visits are billed to your insurance company just like a normal visit would be.  By agreeing to a virtual visit, we'd like you to understand that the technology does not allow for your provider to perform an examination, and thus may limit your provider's ability to fully assess your condition. If your provider identifies any concerns that need to be evaluated in person, we will make arrangements to do so.  Finally, though the technology is pretty good, we cannot assure that it will always work on either your or our end, and in the setting of a video visit, we may have to convert it to a phone-only visit.  In either situation, we cannot ensure that we have a secure connection.  Are you willing to proceed?" STAFF: Did the patient verbally acknowledge consent to telehealth visit? Document  YES/NO here: yes  4. Advise patient to be prepared - "Two hours prior to your appointment, go ahead and check your blood pressure, pulse, oxygen saturation, and your weight (if you have the equipment to check those) and write them all down. When your visit starts, your provider will ask you for this information. If you have an Apple Watch or Kardia device, please plan to have heart rate information ready on the day of your appointment. Please have a pen and paper handy nearby the day of the visit as well."  5. Give patient instructions for MyChart download to smartphone OR Doximity/Doxy.me as below if video visit (depending on what platform provider is using)  6. Inform patient they will receive a phone call 15 minutes prior to their appointment time (may be from unknown caller ID) so they should be prepared to answer    TELEPHONE CALL NOTE  Mark Leach has been deemed a candidate for a follow-up tele-health visit to limit community exposure during the Covid-19 pandemic. I spoke with the patient via phone to ensure availability of phone/video source, confirm preferred email & phone number, and discuss instructions and expectations.  I reminded Mark Leach to be prepared with any vital sign and/or heart rhythm information that could potentially be obtained via home monitoring, at the time of his visit. I reminded Mark Leach to expect a phone call prior to  his visit.  [name] 03/13/2019 1:32 PM   INSTRUCTIONS FOR DOWNLOADING THE MYCHART APP TO SMARTPHONE  - The patient must first make sure to have activated MyChart and know their login information - If Apple, go to [employer] and type in MyChart in the search bar and download the app. If Android, ask patient to go to [employer] and type in [place] in the search bar and download the app. The app is free but as with any other app downloads, their phone may require them to verify saved payment information or Apple/Android  password.  - The patient will need to then log into the app with their MyChart username and password, and select [place] as their healthcare provider to link the account. When it is time for your visit, go to the MyChart app, find appointments, and click Begin Video Visit. Be sure to Select Allow for your device to access the Microphone and Camera for your visit. You will then be connected, and your provider will be with you shortly.  **If they have any issues connecting, or need assistance please contact MyChart service desk [phone])**  **If using a computer, in order to ensure the best quality for their visit they will need to use either of the following Internet Browsers: [employer] or Google Chrome**  IF USING DOXIMITY or DOXY.ME - The patient will receive a link just prior to their visit by text.     FULL LENGTH CONSENT FOR TELE-HEALTH VISIT   I hereby voluntarily request, consent and authorize [facility] and its employed or contracted physicians, physician assistants, nurse practitioners or other licensed health care professionals (the Practitioner), to provide me with telemedicine health care services (the Services") as deemed necessary by the treating Practitioner. I acknowledge and consent to receive the Services by the Practitioner via telemedicine. I understand that the telemedicine visit will involve communicating with the Practitioner through live audiovisual communication technology and the disclosure of certain medical information by electronic transmission. I acknowledge that I have been given the opportunity to request an in-person assessment or other available alternative prior to the telemedicine visit and am voluntarily participating in the telemedicine visit.  I understand that I have the right to withhold or withdraw my consent to the use of telemedicine in the course of my care at any time, without affecting my right to future care or treatment,  and that the Practitioner or I may terminate the telemedicine visit at any time. I understand that I have the right to inspect all information obtained and/or recorded in the course of the telemedicine visit and may receive copies of available information for a reasonable fee.  I understand that some of the potential risks of receiving the Services via telemedicine include:   Delay or interruption in medical evaluation due to technological equipment failure or disruption;  Information transmitted may not be sufficient (e.g. poor resolution of images) to allow for appropriate medical decision making by the Practitioner; and/or   In rare instances, security protocols could fail, causing a breach of personal health information.  Furthermore, I acknowledge that it is my responsibility to provide information about my medical history, conditions and care that is complete and accurate to the best of my ability. I acknowledge that Practitioner's advice, recommendations, and/or decision may be based on factors not within their control, such as incomplete or inaccurate data provided by me or distortions of diagnostic images or specimens that may result from electronic transmissions. I  understand that the practice of medicine is not an exact science and that Practitioner makes no warranties or guarantees regarding treatment outcomes. I acknowledge that I will receive a copy of this consent concurrently upon execution via email to the email address I last provided but may also request a printed copy by calling the office of [place].    I understand that my insurance will be billed for this visit.   I have read or had this consent read to me.  I understand the contents of this consent, which adequately explains the benefits and risks of the Services being provided via telemedicine.   I have been provided ample opportunity to ask questions regarding this consent and the Services and have had my questions  answered to my satisfaction.  I give my informed consent for the services to be provided through the use of telemedicine in my medical care  By participating in this telemedicine visit I agree to the above.

## 2019-03-13 NOTE — Progress Notes (Addendum)
MEDICARE ANNUAL WELLNESS VISIT  03/13/2019  Telephone Visit Disclaimer This Medicare AWV was conducted by telephone due to national recommendations for restrictions regarding the COVID-19 Pandemic (e.g. social distancing).  I verified, using two identifiers, that I am speaking with Mark Leach or their authorized healthcare agent. I discussed the limitations, risks, security, and privacy concerns of performing an evaluation and management service by telephone and the potential availability of an in-person appointment in the future. The patient expressed understanding and agreed to proceed.   Subjective:  Mark Leach is a 48 y.o. male patient of Hawks, Theador Hawthorne, FNP who had a Medicare Annual Wellness Visit today via telephone. Mark Leach is disabled and lives with his wife, step daughter, step son, and step daughter in [place]. He had one biological child that passed away at 45 months of age.  He reports that he is somewhat socially active and does interact with friends/family regularly. He is minimally physically active due to knee pain.  He enjoys reading and playing video games.  Patient Care Team: Sharion Balloon, FNP as PCP - General (Nurse Practitioner) Shea Evans, Norva Riffle, LCSW as Social Worker (Licensed Clinical Social Worker) Annamaria Boots, Kasandra Knudsen, MD as Consulting Physician (Pulmonary Disease) Branch, Alphonse Guild, MD as Consulting Physician (Cardiology) Cassandria Anger, MD as Consulting Physician (Endocrinology)  Advanced Directives 03/13/2019 04/06/2015 12/03/2014 10/11/2014  Does Patient Have a Medical Advance Directive? No No No No  Would patient like information on creating a medical advance directive? Yes (MAU/Ambulatory/Procedural Areas - Information given) No - patient declined information No - patient declined information No - patient declined information    Hospital Utilization Over the Past 12 Months: # of hospitalizations or ER visits: 0 # of surgeries: 0  Review of  Systems    Patient reports that his overall health is unchanged compared to last year.     Review of Systems:  Musculoskeletal - positive for bilateral knee and left wrist pain  All other systems negative.  Pain Assessment Pain Score: 4      Current Medications & Allergies (verified) Allergies as of 03/13/2019      Reactions   Nitroglycerin Other (See Comments)   Hypotension, syncope, bradycardia      Medication List       Accurate as of March 13, 2019  4:09 PM. If you have any questions, ask your nurse or doctor.        allopurinol 100 MG tablet Commonly known as:  ZYLOPRIM Take 1 tablet (100 mg total) by mouth daily.   amLODipine 5 MG tablet Commonly known as:  NORVASC TAKE 1 TABLET ONCE DAILY.   aspirin 81 MG tablet Take 81 mg by mouth daily.   atorvastatin 80 MG tablet Commonly known as:  LIPITOR Take 1 tablet (80 mg total) by mouth daily at 6 PM. Pt must keep upcoming appt in June to receive further refills   BLACK CHERRY CONCENTRATE PO Take 1 tablet by mouth daily as needed (gout flare-up).   blood glucose meter kit and supplies Kit Dispense based on patient and insurance preference. Use up to four times daily as directed. (FOR ICD-10 E11.65)   colchicine 0.6 MG tablet Take 1 tablet (0.6 mg total) by mouth daily. What changed:    when to take this  reasons to take this   Global Ease Inject Pen Needles 31G X 8 MM Misc Generic drug:  Insulin Pen Needle   glucose blood test strip 1 each by Other  route as needed. Use as instructed 2 x daily. E11.65 One Touch Verio   Insulin Detemir 100 UNIT/ML Pen Commonly known as:  Levemir FlexTouch INJECT 50 UNITS SUBCUTANEOUSLY ONCE A DAY AT 10PM.   liraglutide 18 MG/3ML Sopn Commonly known as:  Victoza INJECT 0.2ML (1.2MG) INTO THE SKIN ONCE DAILY. What changed:  additional instructions   losartan 100 MG tablet Commonly known as:  COZAAR Take 100 mg by mouth daily.   metFORMIN 1000 MG tablet Commonly  known as:  GLUCOPHAGE TAKE 1 TABLET BY MOUTH TWICE DAILY WITH FOOD.   metoprolol 200 MG 24 hr tablet Commonly known as:  TOPROL-XL TAKE 1 TABLET BY MOUTH ONCE DAILY.   spironolactone 25 MG tablet Commonly known as:  ALDACTONE TAKE (1/2) TABLET BY MOUTH DAILY.   torsemide 20 MG tablet Commonly known as:  DEMADEX TAKE 2 TABLETS BY MOUTH TWICE DAILY.   traMADol 50 MG tablet Commonly known as:  ULTRAM Take 1-2 tablets (50-100 mg total) by mouth every 12 (twelve) hours as needed.       History (reviewed): Past Medical History:  Diagnosis Date  . Chronic systolic CHF (congestive heart failure) ([place])   . Coronary artery disease 01/2013   anterior MI with cath showing 95% pLAD, 90% diag, 30% OM1, 70% mRCA, 60% dRCA s/p PCI with DES to LAD and diag and EF 40%  . Diabetes mellitus type 2, uncontrolled, with complications ([place])   . DJD (degenerative joint disease)    knees  . Hypertension   . Ischemic dilated cardiomyopathy ([place])   . Morbid obesity ([place])   . 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 Stroke [facility]) 10/12/2014   Patient with several month h/o left sided visual field deficit and MRI of brain revealing:  1. Medial right occipital lobe subacute infarct. 2. At least 3 punctate areas of acute nonhemorrhagic infarct are present involving the left thalamus, high posterior right frontal lobe and high a medial posterior left frontal or parietal lobe. 3. Additional white matter changes are noted beyond the areas of  . Visual field defect 10/12/2014   Past Surgical History:  Procedure Laterality Date  . CARDIAC CATHETERIZATION  01/2013   [facility] in [place]  . CHOLECYSTECTOMY    . CORONARY ANGIOPLASTY  01/2013   PCI with stenting of LAD  . CORONARY ARTERY BYPASS GRAFT N/A 10/13/2014   Procedure: CORONARY ARTERY BYPASS GRAFTING (CABG), ON PUMP, TIMES FOUR, USING LEFT INTERNAL MAMMARY ARTERY, BILATERAL GREATER SAPHENOUS VEINS  HARVESTED ENDOSCOPICALLY;  Surgeon: Rexene Alberts, MD;  Location: [place];  Service: Open Heart Surgery;  Laterality: N/A;  . LEFT HEART CATH N/A 10/10/2014   Procedure: LEFT HEART CATH;  Surgeon: Troy Sine, MD;  Location: [facility] CATH LAB;  Service: Cardiovascular;  Laterality: N/A;  . MULTIPLE EXTRACTIONS WITH ALVEOLOPLASTY N/A 10/22/2014   Procedure: Extraction of tooth #'s 1,3,4,5,6,7,8,9,10,11,12,13,14,15,16,20,21,22,24, 25,26,27,28,29,30,32 with alveoloplasty.;  Surgeon: Lenn Cal, DDS;  Location: [place];  Service: Oral Surgery;  Laterality: N/A;   Family History  Problem Relation Age of Onset  . Arthritis Mother   . Asthma Mother   . Heart disease Maternal Uncle   . Heart disease Maternal Grandfather   . Alcohol abuse Maternal Grandfather   . Heart attack Brother 20  . Diabetes Brother    Social History   Socioeconomic History  . Marital status: Married    Spouse name:  Not on file  . Number of children: 1  . Years of education: 67  . Highest education level: Some college, no degree  Occupational History  . Occupation: disabled  Social Needs  . Financial resource strain: Not very hard  . Food insecurity:    Worry: Never true    Inability: Never true  . Transportation needs:    Medical: No    Non-medical: No  Tobacco Use  . Smoking status: Former Smoker    Packs/day: 1.00    Years: 18.00    Pack years: 18.00    Types: Cigarettes    Start date: 01/04/1990    Last attempt to quit: 10/12/2007    Years since quitting: 11.4  . Smokeless tobacco: Never Used  Substance and Sexual Activity  . Alcohol use: No    Alcohol/week: 0.0 standard drinks  . Drug use: No  . Sexual activity: Yes  Lifestyle  . Physical activity:    Days per week: 0 days    Minutes per session: 0 min  . Stress: To some extent  Relationships  . Social connections:    Talks on phone: Once a week    Gets together: Once a week    Attends religious service: More than 4 times per year    Active  member of club or organization: No    Attends meetings of clubs or organizations: Never    Relationship status: Married  Other Topics Concern  . Not on file  Social History Narrative   Lives w/ wife, step daughter, step son, and daughter in [place].  Disabled    Activities of Daily Living In your present state of health, do you have any difficulty performing the following activities: 03/13/2019  Hearing? N  Vision? Y  Comment Left peripheral vision greatly diminished   Difficulty concentrating or making decisions? Y  Comment Trouble with short term memory at times  Walking or climbing stairs? Y  Comment Due to knee pain  Dressing or bathing? Y  Comment Uses shower chair, sometimes has help due to knee pain  Doing errands, shopping? Y  Comment Uses public transporation or family provides Copywriter, advertising and eating ? N  Using the Toilet? N  In the past six months, have you accidently leaked urine? N  Do you have problems with loss of bowel control? N  Managing your Medications? Y  Comment Forgets unless he sets an alarm  Managing your Finances? N  Housekeeping or managing your Housekeeping? N  Some recent data might be hidden    Exercise Current Exercise Habits: The patient does not participate in regular exercise at present, Exercise limited by: orthopedic condition(s);cardiac condition(s)  Diet Patient reports consuming 3 meals a day and 1 snack(s) a day Patient reports that his primary diet is: Diabetic Patient reports that she does have regular access to food.   Depression Screen PHQ 2/9 Scores 03/13/2019 12/04/2018 10/24/2018 08/15/2018 02/05/2018 12/25/2017 12/01/2017  PHQ - 2 Score 0 _0 0 0  PHQ- 9 Score - - 4 - 4 - -     Fall Risk Fall Risk  03/13/2019 02/05/2018 12/25/2017 12/01/2017 11/06/2017  Falls in the past year? 1 No No No No  Number falls in past yr: 0 - - - -  Injury with Fall? 0 - - - -  Risk for fall due to : Impaired mobility;Impaired  balance/gait - Impaired balance/gait;Impaired mobility - -  Follow up Education provided;Falls prevention discussed - - - -  Objective:  Mark Leach seemed alert and oriented and he participated appropriately during our telephone visit.  Blood Pressure Weight BMI  BP Readings from Last 3 Encounters:  12/04/18 (!) 149/90  10/24/18 (!) 178/106  08/15/18 (!) 193/98   Wt Readings from Last 3 Encounters:  12/04/18 (!) 350 lb 9.6 oz (159 kg)  10/24/18 (!) 352 lb 6.4 oz (159.8 kg)  08/15/18 (!) 355 lb 9.6 oz (161.3 kg)   BMI Readings from Last 1 Encounters:  12/04/18 48.90 kg/m    *Unable to obtain current vital signs, weight, and BMI due to telephone visit type  Hearing/Vision  . Griffey did not seem to have difficulty with hearing/understanding during the telephone conversation . Reports that he has had a formal eye exam by an eye care professional within the past year . Reports that he has not had a formal hearing evaluation within the past year *Unable to fully assess hearing and vision during telephone visit type  Cognitive Function: 6CIT Screen 03/13/2019  What Year? 0 points  What month? 0 points  What time? 0 points  Count back from 20 0 points  Months in reverse 0 points  Repeat phrase 2 points  Total Score 2    Normal Cognitive Function Screening: Yes (Normal:0-7, Significant for Dysfunction: >8)  Immunization & Health Maintenance Record Immunization History  Administered Date(s) Administered  . Influenza,inj,Quad PF,6+ Mos 08/05/2015, 09/12/2016, 07/14/2017  . Influenza-Unspecified 07/24/2018  . Pneumococcal Conjugate-13 08/07/2015  . Pneumococcal Polysaccharide-23 12/04/2018  . Tdap 02/22/2016    Health Maintenance  Topic Date Due  . HEMOGLOBIN A1C  04/24/2019  . INFLUENZA VACCINE  05/11/2019  . FOOT EXAM  12/05/2019  . OPHTHALMOLOGY EXAM  12/20/2019  . TETANUS/TDAP  02/21/2026  . PNEUMOCOCCAL POLYSACCHARIDE VACCINE AGE 61-64 HIGH RISK  Completed  .  HIV Screening  Completed       Assessment  This is a routine wellness examination for Mark Leach.  Health Maintenance: Due or Overdue There are no preventive care reminders to display for this patient.  Mark Leach is currently enrolled in the CCM program at [facility].    Plan:  Personalized Goals Goals Addressed            This Visit's Progress   . Patient Stated       Work on controlling diabetes and gout by eating a low carbohydrate and low purine diet.      Personalized Health Maintenance & Screening Recommendations  Advanced directives: has NO advanced directive  - add't info requested. Referral to SW: no  Lung Cancer Screening Recommended: not applicable (Low Dose CT Chest recommended if Age 47-80 years, 30 pack-year currently smoking OR have quit w/in past 15 years) Hepatitis C Screening recommended: not applicable   Advanced Directives: Written information was prepared per patient's request.  Referrals & Orders No orders of the defined types were placed in this encounter.   Follow-up Plan . Follow-up with Sharion Balloon, FNP as planned . Please review the information given on Advance Directives.  If you complete the paperwork, please bring a copy to our office to be filed in your medical record.  . Work on your goal of getting better control of your diabetes and gout by eating a low carbohydrate and low purine diet.  . Work on increasing exercise as tolerated.  Walking is a great option.   I have personally reviewed and noted the following in the patient's chart:   . Medical and social  history . Use of alcohol, tobacco or illicit drugs  . Current medications and supplements . Functional ability and status . Nutritional status . Physical activity . Advanced directives . List of other physicians . Hospitalizations, surgeries, and ER visits in previous 12 months . Vitals . Screenings to include cognitive, depression, and falls . Referrals and  appointments  In addition, I have reviewed and discussed with Mark Leach certain preventive protocols, quality metrics, and best practice recommendations. A written personalized care plan for preventive services as well as general preventive health recommendations is available and can be mailed to the patient at his request.      ,  M  03/13/2019    I have reviewed and agree with the above AWV documentation.   Evelina Dun, FNP

## 2019-03-14 ENCOUNTER — Ambulatory Visit (INDEPENDENT_AMBULATORY_CARE_PROVIDER_SITE_OTHER): Payer: Medicare Other | Admitting: Family

## 2019-03-14 ENCOUNTER — Encounter: Payer: Self-pay | Admitting: Family

## 2019-03-14 VITALS — BP 148/95 | HR 104 | Temp 97.2°F | Ht 71.0 in | Wt 345.8 lb

## 2019-03-14 DIAGNOSIS — M150 Primary generalized (osteo)arthritis: Secondary | ICD-10-CM

## 2019-03-14 DIAGNOSIS — E118 Type 2 diabetes mellitus with unspecified complications: Secondary | ICD-10-CM

## 2019-03-14 DIAGNOSIS — E1169 Type 2 diabetes mellitus with other specified complication: Secondary | ICD-10-CM | POA: Diagnosis not present

## 2019-03-14 DIAGNOSIS — F331 Major depressive disorder, recurrent, moderate: Secondary | ICD-10-CM

## 2019-03-14 DIAGNOSIS — Z9119 Patient's noncompliance with other medical treatment and regimen: Secondary | ICD-10-CM | POA: Diagnosis not present

## 2019-03-14 DIAGNOSIS — E785 Hyperlipidemia, unspecified: Secondary | ICD-10-CM

## 2019-03-14 DIAGNOSIS — Z0289 Encounter for other administrative examinations: Secondary | ICD-10-CM

## 2019-03-14 DIAGNOSIS — I10 Essential (primary) hypertension: Secondary | ICD-10-CM | POA: Diagnosis not present

## 2019-03-14 DIAGNOSIS — I2510 Atherosclerotic heart disease of native coronary artery without angina pectoris: Secondary | ICD-10-CM

## 2019-03-14 DIAGNOSIS — I5043 Acute on chronic combined systolic (congestive) and diastolic (congestive) heart failure: Secondary | ICD-10-CM | POA: Diagnosis not present

## 2019-03-14 DIAGNOSIS — G4733 Obstructive sleep apnea (adult) (pediatric): Secondary | ICD-10-CM | POA: Diagnosis not present

## 2019-03-14 DIAGNOSIS — Z91199 Patient's noncompliance with other medical treatment and regimen due to unspecified reason: Secondary | ICD-10-CM

## 2019-03-14 DIAGNOSIS — F1120 Opioid dependence, uncomplicated: Secondary | ICD-10-CM

## 2019-03-14 DIAGNOSIS — E1159 Type 2 diabetes mellitus with other circulatory complications: Secondary | ICD-10-CM

## 2019-03-14 DIAGNOSIS — E1165 Type 2 diabetes mellitus with hyperglycemia: Secondary | ICD-10-CM

## 2019-03-14 DIAGNOSIS — IMO0002 Reserved for concepts with insufficient information to code with codable children: Secondary | ICD-10-CM

## 2019-03-14 DIAGNOSIS — M159 Polyosteoarthritis, unspecified: Secondary | ICD-10-CM

## 2019-03-14 DIAGNOSIS — Z951 Presence of aortocoronary bypass graft: Secondary | ICD-10-CM

## 2019-03-14 LAB — LIPID PANEL

## 2019-03-14 LAB — BAYER DCA HB A1C WAIVED: HB A1C (BAYER DCA - WAIVED): >14 % — ABNORMAL HIGH (ref <7.0)

## 2019-03-14 MED ORDER — TRAMADOL HCL 50 MG PO TABS: 50.0000 mg | ORAL_TABLET | Freq: Two times a day (BID) | ORAL | 2 refills | Status: DC | PRN

## 2019-03-14 NOTE — Progress Notes (Signed)
Subjective:    Patient ID: Mark Leach, male    DOB: [birth year], 48 y.o.   MRN: [id number]  Chief Complaint  Patient presents with  . Medical Management of Chronic Issues  . Diabetes   Pt presents to the office today for chronic follow up. Pt is followed by his Cardiologists every 3 month for CHF, HX of CABG, and HX of stroke and MI. Pt states he is suppose to weight himself daily, but he is noncompliant.   PT is followed by Endo for uncontrolled DM2.States he missed his last appt and never rescheduled.  Pt is noncompliant with his diet and medications.  He states he has tried taking his medications "more regularly", but still misses some days. He has a telephone visit with his Cardiologists 03/18/19. He will call his Endocrinologists this week for an appointment.   Diabetes  He presents for his follow-up diabetic visit. He has type 2 diabetes mellitus. Associated symptoms include blurred vision. Pertinent negatives for diabetes include no foot paresthesias. Symptoms are worsening. Diabetic complications include a CVA and heart disease. Risk factors for coronary artery disease include dyslipidemia, diabetes mellitus, male sex, hypertension and sedentary lifestyle. His overall blood glucose range is >200 mg/dl. Eye exam is current.  Hypertension  This is a chronic problem. The current episode started more than 1 year ago. The problem has been resolved since onset. The problem is uncontrolled. Associated symptoms include blurred vision, malaise/fatigue, peripheral edema and shortness of breath. Risk factors for coronary artery disease include diabetes mellitus, dyslipidemia, obesity and sedentary lifestyle. Hypertensive end-organ damage includes CAD/MI, CVA and heart failure.  Hyperlipidemia  This is a chronic problem. The current episode started more than 1 year ago. The problem is uncontrolled. Recent lipid tests were reviewed and are high. Exacerbating diseases include obesity.  Associated symptoms include shortness of breath. Current antihyperlipidemic treatment includes statins. The current treatment provides mild improvement of lipids. Risk factors for coronary artery disease include dyslipidemia, diabetes mellitus, male sex, hypertension and a sedentary lifestyle.  Congestive Heart Failure  Presents for follow-up visit. Associated symptoms include edema and shortness of breath.  Arthritis  Presents for follow-up visit. He complains of pain and stiffness. The symptoms have been worsening. Affected locations include the right knee, left knee and left wrist (back). His pain is at a severity of 3/10.     Current opioids rx- Ultram 100 mg BID as needed for pain # meds rx- 120 Effectiveness of current meds-Uses only as needed, one rx usually lasts 4 months Adverse reactions form pain meds-None Morphine equivalent- 54me/day  Pill count performed-No Last drug screen - 07/24/18 ( high risk q391mmoderate risk [id number] risk yearly ) Urine drug screen today- No [facility] [place]  If yes were their any concerning findings? - none  No flowsheet data found.   Pain contract signed on: 07/27/18   Review of Systems  Constitutional: Positive for malaise/fatigue.  Eyes: Positive for blurred vision.  Respiratory: Positive for shortness of breath.   Musculoskeletal: Positive for arthritis and stiffness.  All other systems reviewed and are negative.      Objective:   Physical Exam Vitals signs reviewed.  Constitutional:      General: He is not in acute distress.    Appearance: He is well-developed. He is obese.  HENT:     Head: Normocephalic.     Right Ear: Tympanic membrane normal.     Left Ear: Tympanic membrane normal.  Eyes:  General:        Right eye: No discharge.        Left eye: No discharge.     Pupils: Pupils are equal, round, and reactive to light.  Neck:     Musculoskeletal: Normal range of motion and neck supple.     Thyroid: No  thyromegaly.  Cardiovascular:     Rate and Rhythm: Normal rate and regular rhythm.     Heart sounds: Normal heart sounds. No murmur.  Pulmonary:     Effort: Pulmonary effort is normal. No respiratory distress.     Breath sounds: Normal breath sounds. No wheezing.  Abdominal:     General: Bowel sounds are normal. There is no distension.     Palpations: Abdomen is soft.     Tenderness: There is no abdominal tenderness.  Musculoskeletal:        General: No tenderness.     Right lower leg: Edema (2+) present.     Left lower leg: Edema (2+) present.     Comments: Generalized weakness, using cane   Skin:    General: Skin is warm and dry.     Findings: No erythema or rash.  Neurological:     Mental Status: He is alert and oriented to person, place, and time.     Cranial Nerves: No cranial nerve deficit.     Deep Tendon Reflexes: Reflexes are normal and symmetric.  Psychiatric:        Behavior: Behavior normal.        Thought Content: Thought content normal.        Judgment: Judgment normal.       BP (!) 148/95   Pulse (!) 104   Temp (!) 97.2 F (36.2 C) (Oral)   Ht '5\' 11"'$  (1.803 m)   Wt (!) 345 lb 12.8 oz (156.9 kg)   BMI 48.23 kg/m      Assessment & Plan:  Mark Leach comes in today with chief complaint of Medical Management of Chronic Issues and Diabetes   Diagnosis and orders addressed:  1. Acute on chronic combined systolic and diastolic heart failure (HCC) - CMP14+EGFR - CBC with Differential/Platelet  2. Coronary artery disease involving native coronary artery of native heart without angina pectoris - CMP14+EGFR - CBC with Differential/Platelet  3. Diabetes mellitus type 2, uncontrolled, with complications (HCC) - [id number] - CBC with Differential/Platelet  4. DM type 2 causing vascular disease ([place]) - Bayer DCA Hb A1c Waived - CMP14+EGFR - CBC with Differential/Platelet  5. Hyperlipidemia associated with type 2 diabetes mellitus (HCC) -  CMP14+EGFR - CBC with Differential/Platelet - Lipid panel  6. Hypertension associated with diabetes ([place]) - CMP14+EGFR - CBC with Differential/Platelet  7. Moderate episode of recurrent major depressive disorder (HCC) - CMP14+EGFR - CBC with Differential/Platelet  8. Morbid obesity ([place]) - CMP14+EGFR - CBC with Differential/Platelet  9. Noncompliance - CMP14+EGFR - CBC with Differential/Platelet  10. Obstructive sleep apnea - CMP14+EGFR - CBC with Differential/Platelet  11. Uncomplicated opioid dependence (HCC) - traMADol (ULTRAM) 50 MG tablet; Take 1-2 tablets (50-100 mg total) by mouth every 12 (twelve) hours as needed.  Dispense: 90 tablet; Refill: 2 - CMP14+EGFR - CBC with Differential/Platelet  12. Primary osteoarthritis involving multiple joints - traMADol (ULTRAM) 50 MG tablet; Take 1-2 tablets (50-100 mg total) by mouth every 12 (twelve) hours as needed.  Dispense: 90 tablet; Refill: 2 - CMP14+EGFR - CBC with Differential/Platelet  13. Pain management contract signed - traMADol (ULTRAM) 50 MG tablet; Take  1-2 tablets (50-100 mg total) by mouth every 12 (twelve) hours as needed.  Dispense: 90 tablet; Refill: 2 - CMP14+EGFR - CBC with Differential/Platelet  14. S/P CABG x 4 - CMP14+EGFR - CBC with Differential/Platelet   Labs pending Health Maintenance reviewed Diet and exercise encouraged  Follow up plan: 2 months   Evelina Dun, FNP

## 2019-03-14 NOTE — Patient Instructions (Signed)
Diabetes Mellitus and Nutrition, Adult  When you have diabetes (diabetes mellitus), it is very important to have healthy eating habits because your blood sugar (glucose) levels are greatly affected by what you eat and drink. Eating healthy foods in the appropriate amounts, at about the same times every day, can help you:  · Control your blood glucose.  · Lower your risk of heart disease.  · Improve your blood pressure.  · Reach or maintain a healthy weight.  Every person with diabetes is different, and each person has different needs for a meal plan. Your health care provider may recommend that you work with a diet and nutrition specialist (dietitian) to make a meal plan that is best for you. Your meal plan may vary depending on factors such as:  · The calories you need.  · The medicines you take.  · Your weight.  · Your blood glucose, blood pressure, and cholesterol levels.  · Your activity level.  · Other health conditions you have, such as heart or kidney disease.  How do carbohydrates affect me?  Carbohydrates, also called carbs, affect your blood glucose level more than any other type of food. Eating carbs naturally raises the amount of glucose in your blood. Carb counting is a method for keeping track of how many carbs you eat. Counting carbs is important to keep your blood glucose at a healthy level, especially if you use insulin or take certain oral diabetes medicines.  It is important to know how many carbs you can safely have in each meal. This is different for every person. Your dietitian can help you calculate how many carbs you should have at each meal and for each snack.  Foods that contain carbs include:  · Bread, cereal, rice, pasta, and crackers.  · Potatoes and corn.  · Peas, beans, and lentils.  · Milk and yogurt.  · Fruit and juice.  · Desserts, such as cakes, cookies, ice cream, and candy.  How does alcohol affect me?  Alcohol can cause a sudden decrease in blood glucose (hypoglycemia),  especially if you use insulin or take certain oral diabetes medicines. Hypoglycemia can be a life-threatening condition. Symptoms of hypoglycemia (sleepiness, dizziness, and confusion) are similar to symptoms of having too much alcohol.  If your health care provider says that alcohol is safe for you, follow these guidelines:  · Limit alcohol intake to no more than 1 drink per day for nonpregnant women and 2 drinks per day for men. One drink equals 12 oz of beer, 5 oz of wine, or 1½ oz of hard liquor.  · Do not drink on an empty stomach.  · Keep yourself hydrated with water, diet soda, or unsweetened iced tea.  · Keep in mind that regular soda, juice, and other mixers may contain a lot of sugar and must be counted as carbs.  What are tips for following this plan?    Reading food labels  · Start by checking the serving size on the "Nutrition Facts" label of packaged foods and drinks. The amount of calories, carbs, fats, and other nutrients listed on the label is based on one serving of the item. Many items contain more than one serving per package.  · Check the total grams (g) of carbs in one serving. You can calculate the number of servings of carbs in one serving by dividing the total carbs by 15. For example, if a food has 30 g of total carbs, it would be equal to 2   servings of carbs.  · Check the number of grams (g) of saturated and trans fats in one serving. Choose foods that have low or no amount of these fats.  · Check the number of milligrams (mg) of salt (sodium) in one serving. Most people should limit total sodium intake to less than 2,300 mg per day.  · Always check the nutrition information of foods labeled as "low-fat" or "nonfat". These foods may be higher in added sugar or refined carbs and should be avoided.  · Talk to your dietitian to identify your daily goals for nutrients listed on the label.  Shopping  · Avoid buying canned, premade, or processed foods. These foods tend to be high in fat, sodium,  and added sugar.  · Shop around the outside edge of the grocery store. This includes fresh fruits and vegetables, bulk grains, fresh meats, and fresh dairy.  Cooking  · Use low-heat cooking methods, such as baking, instead of high-heat cooking methods like deep frying.  · Cook using healthy oils, such as olive, canola, or sunflower oil.  · Avoid cooking with butter, cream, or high-fat meats.  Meal planning  · Eat meals and snacks regularly, preferably at the same times every day. Avoid going long periods of time without eating.  · Eat foods high in fiber, such as fresh fruits, vegetables, beans, and whole grains. Talk to your dietitian about how many servings of carbs you can eat at each meal.  · Eat 4-6 ounces (oz) of lean protein each day, such as lean meat, chicken, fish, eggs, or tofu. One oz of lean protein is equal to:  ? 1 oz of meat, chicken, or fish.  ? 1 egg.  ? ¼ cup of tofu.  · Eat some foods each day that contain healthy fats, such as avocado, nuts, seeds, and fish.  Lifestyle  · Check your blood glucose regularly.  · Exercise regularly as told by your health care provider. This may include:  ? 150 minutes of moderate-intensity or vigorous-intensity exercise each week. This could be brisk walking, biking, or water aerobics.  ? Stretching and doing strength exercises, such as yoga or weightlifting, at least 2 times a week.  · Take medicines as told by your health care provider.  · Do not use any products that contain nicotine or tobacco, such as cigarettes and e-cigarettes. If you need help quitting, ask your health care provider.  · Work with a counselor or diabetes educator to identify strategies to manage stress and any emotional and social challenges.  Questions to ask a health care provider  · Do I need to meet with a diabetes educator?  · Do I need to meet with a dietitian?  · What number can I call if I have questions?  · When are the best times to check my blood glucose?  Where to find more  information:  · American Diabetes Association: diabetes.org  · Academy of Nutrition and Dietetics: www.eatright.org  · National Institute of Diabetes and Digestive and Kidney Diseases (NIH): www.niddk.nih.gov  Summary  · A healthy meal plan will help you control your blood glucose and maintain a healthy lifestyle.  · Working with a diet and nutrition specialist (dietitian) can help you make a meal plan that is best for you.  · Keep in mind that carbohydrates (carbs) and alcohol have immediate effects on your blood glucose levels. It is important to count carbs and to use alcohol carefully.  This information is not intended to   replace advice given to you by your health care provider. Make sure you discuss any questions you have with your health care provider.  Document Released: 06/23/2005 Document Revised: 04/26/2017 Document Reviewed: 10/31/2016  Elsevier Interactive Patient Education © 2019 Elsevier Inc.

## 2019-03-15 ENCOUNTER — Telehealth: Payer: Self-pay | Admitting: *Deleted

## 2019-03-15 LAB — CBC WITH DIFFERENTIAL/PLATELET
Basophils Absolute: 0.1 10*3/uL (ref 0.0–0.2)
Basos: 1 %
EOS (ABSOLUTE): 0.2 10*3/uL (ref 0.0–0.4)
Eos: 2 %
Hematocrit: 49.1 % (ref 37.5–51.0)
Hemoglobin: 16 g/dL (ref 13.0–17.7)
Immature Grans (Abs): 0.1 10*3/uL (ref 0.0–0.1)
Immature Granulocytes: 1 %
Lymphocytes Absolute: 0.8 10*3/uL (ref 0.7–3.1)
Lymphs: 10 %
MCH: 29.5 pg (ref 26.6–33.0)
MCHC: 32.6 g/dL (ref 31.5–35.7)
MCV: 90 fL (ref 79–97)
Monocytes Absolute: 0.5 10*3/uL (ref 0.1–0.9)
Monocytes: 6 %
Neutrophils Absolute: 6.2 10*3/uL (ref 1.4–7.0)
Neutrophils: 80 %
Platelets: 174 10*3/uL (ref 150–450)
RBC: 5.43 x10E6/uL (ref 4.14–5.80)
RDW: 11.5 % — ABNORMAL LOW (ref 11.6–15.4)
WBC: 7.8 10*3/uL (ref 3.4–10.8)

## 2019-03-15 LAB — CMP14+EGFR
ALT: 43 IU/L (ref 0–44)
AST: 24 IU/L (ref 0–40)
Albumin/Globulin Ratio: 1.6 (ref 1.2–2.2)
Albumin: 4.4 g/dL (ref 4.0–5.0)
Alkaline Phosphatase: 232 IU/L — ABNORMAL HIGH (ref 39–117)
BUN/Creatinine Ratio: 16 (ref 9–20)
BUN: 21 mg/dL (ref 6–24)
Bilirubin Total: 0.7 mg/dL (ref 0.0–1.2)
CO2: 25 mmol/L (ref 20–29)
Calcium: 9.6 mg/dL (ref 8.7–10.2)
Chloride: 89 mmol/L — ABNORMAL LOW (ref 96–106)
Creatinine, Ser: 1.29 mg/dL — ABNORMAL HIGH (ref 0.76–1.27)
GFR calc Af Amer: 76 mL/min/{1.73_m2} (ref >59)
GFR calc non Af Amer: 66 mL/min/{1.73_m2} (ref >59)
Globulin, Total: 2.8 g/dL (ref 1.5–4.5)
Glucose: 514 mg/dL (ref 65–99)
Potassium: 5 mmol/L (ref 3.5–5.2)
Sodium: 132 mmol/L — ABNORMAL LOW (ref 134–144)
Total Protein: 7.2 g/dL (ref 6.0–8.5)

## 2019-03-15 LAB — LIPID PANEL
Chol/HDL Ratio: 6.4 ratio — ABNORMAL HIGH (ref 0.0–5.0)
Cholesterol, Total: 186 mg/dL (ref 100–199)
HDL: 29 mg/dL — ABNORMAL LOW (ref >39)
LDL Calculated: 107 mg/dL — ABNORMAL HIGH (ref 0–99)
Triglycerides: 249 mg/dL — ABNORMAL HIGH (ref 0–149)
VLDL Cholesterol Cal: 50 mg/dL — ABNORMAL HIGH (ref 5–40)

## 2019-03-15 NOTE — Telephone Encounter (Signed)
[email or web address]

## 2019-03-15 NOTE — Telephone Encounter (Signed)
Call from [facility] regarding pt's recent labs BS [phone] Called pt and he checked while on phone BS [phone] Pt does not have transportation to come in  Please advise

## 2019-03-15 NOTE — Telephone Encounter (Signed)
I did not say I wanted him to come in I just want him to check his own blood sugars 4 times a day and call Christy with those numbers in 1 week

## 2019-03-18 ENCOUNTER — Telehealth (INDEPENDENT_AMBULATORY_CARE_PROVIDER_SITE_OTHER): Payer: Medicare Other | Admitting: Cardiology

## 2019-03-18 ENCOUNTER — Encounter: Payer: Self-pay | Admitting: Cardiology

## 2019-03-18 ENCOUNTER — Other Ambulatory Visit: Payer: Self-pay | Admitting: Family

## 2019-03-18 VITALS — BP 150/87 | Ht 71.0 in | Wt 319.0 lb

## 2019-03-18 DIAGNOSIS — E118 Type 2 diabetes mellitus with unspecified complications: Secondary | ICD-10-CM

## 2019-03-18 DIAGNOSIS — I5022 Chronic systolic (congestive) heart failure: Secondary | ICD-10-CM | POA: Diagnosis not present

## 2019-03-18 DIAGNOSIS — Z794 Long term (current) use of insulin: Secondary | ICD-10-CM

## 2019-03-18 DIAGNOSIS — I2510 Atherosclerotic heart disease of native coronary artery without angina pectoris: Secondary | ICD-10-CM

## 2019-03-18 DIAGNOSIS — E782 Mixed hyperlipidemia: Secondary | ICD-10-CM

## 2019-03-18 DIAGNOSIS — I10 Essential (primary) hypertension: Secondary | ICD-10-CM

## 2019-03-18 MED ORDER — AMLODIPINE BESYLATE 10 MG PO TABS: 10.0000 mg | ORAL_TABLET | Freq: Every day | ORAL | 3 refills | Status: DC

## 2019-03-18 NOTE — Patient Instructions (Addendum)
Medication Instructions:   Your physician has recommended you make the following change in your medication:   Increase amlodipine to 10 mg daily. You may take (2) of your 5 mg tablets daily until they are finished  Continue all other medications the same  Labwork:  NONE  Testing/Procedures:  NONE  Follow-Up:  Your physician recommends that you schedule a follow-up appointment in: 3 months.  Any Other Special Instructions Will Be Listed Below (If Applicable).  If you need a refill on your cardiac medications before your next appointment, please call your pharmacy.

## 2019-03-18 NOTE — Telephone Encounter (Signed)
Patient aware.

## 2019-03-18 NOTE — Addendum Note (Signed)
Addended by: Merlene Laughter on: 03/18/2019 09:26 AM   Modules accepted: Orders

## 2019-03-18 NOTE — Progress Notes (Signed)
Virtual Visit via Video Note   This visit type was conducted due to national recommendations for restrictions regarding the COVID-19 Pandemic (e.g. social distancing) in an effort to limit this patient's exposure and mitigate transmission in our community.  Due to his co-morbid illnesses, this patient is at least at moderate risk for complications without adequate follow up.  This format is felt to be most appropriate for this patient at this time.  All issues noted in this document were discussed and addressed.  A limited physical exam was performed with this format.  Please refer to the patient's chart for his consent to telehealth for [facility].    Technical difficulties early on in video call, converted to telephone call only   Date:  03/18/2019   ID:  Mark Leach, DOB October 23, 1970, MRN [id number]  Patient Location: Home Provider Location: Office  PCP:  Junie SpencerHawks, Christy A, FNP  Cardiologist:  Dina RichBranch, Trinady Milewski, MD  Electrophysiologist:  None   Evaluation Performed:  Follow-Up Visit  Chief Complaint:  4 month follow up (overdue)  History of Present Illness:    Mark Leach is a 48 y.o. male seen today for follow up of the following medical problems.   1. CAD/Chronic systolic HF/ICM - h[facility] of prior anterior STEM[facility] April [facility], received stent to LAD - admit Jan 2016 with MI, cath severe CAD as described below with LVEF by LV gram 25% referred for CABG - s/p CABG Jan 2016 (LIMA-LAD, SVG-diag, SVG-PDA,SVG-OM) - 02/2015 echo: LVEF 30-35% - seen by EP 02/2015 for ICD evaluation, patient requested to hold off at that time.   - no recent chest pain - no SOB/DOE. Swelling is up and down. Weight 345 lbs. Has been eating healthier, down 20 lbs since 09/2017 - mixed compliance with meds is an ongoing problem  2. OSA - compliant with cpap -  Followed by pulmonary Dr Maple HudsonYoung  3. Hyperlipidemia - mixed compliance with statin 03/2019 TC 186 TG 249 HDL 29 LDL 107  4.  HTN - mixed compliance with meds, did take this AM prior to checking his bp  5. DM2 - mixed compliance with therapy - 03/2019 HgbA1c>14  The patient does not have symptoms concerning for COVID-19 infection (fever, chills, cough, or new shortness of breath).    Past Medical History:  Diagnosis Date  . Chronic systolic CHF (congestive heart failure) (HCC)   . Coronary artery disease 01/2013   anterior MI with cath showing 95% pLAD, 90% diag, 30% OM1, 70% mRCA, 60% dRCA s/p PCI with DES to LAD and diag and EF 40%  . Diabetes mellitus type 2, uncontrolled, with complications (HCC)   . DJD (degenerative joint disease)    knees  . Hypertension   . Ischemic dilated cardiomyopathy (HCC)   . Morbid obesity (HCC)   . 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 Stroke [facility](HCC) 10/12/2014   Patient with several month h/o left sided visual field deficit and MRI of brain revealing:  1. Medial right occipital lobe subacute infarct. 2. At least 3 punctate areas of acute nonhemorrhagic infarct are present involving the left thalamus, high posterior right frontal lobe and high a medial posterior left frontal or parietal lobe. 3. Additional white matter changes are noted beyond the areas of  . Visual field defect 10/12/2014   Past Surgical History:  Procedure Laterality Date  . CARDIAC  CATHETERIZATION  01/2013   [facility] in [place]  . CHOLECYSTECTOMY    . CORONARY ANGIOPLASTY  01/2013   PCI with stenting of LAD  . CORONARY ARTERY BYPASS GRAFT N/A 10/13/2014   Procedure: CORONARY ARTERY BYPASS GRAFTING (CABG), ON PUMP, TIMES FOUR, USING LEFT INTERNAL MAMMARY ARTERY, BILATERAL GREATER SAPHENOUS VEINS HARVESTED ENDOSCOPICALLY;  Surgeon: Rexene Alberts, MD;  Location: [place];  Service: Open Heart Surgery;  Laterality: N/A;  . LEFT HEART CATH N/A 10/10/2014   Procedure: LEFT HEART CATH;  Surgeon: Troy Sine, MD;  Location: [facility] CATH LAB;  Service:  Cardiovascular;  Laterality: N/A;  . MULTIPLE EXTRACTIONS WITH ALVEOLOPLASTY N/A 10/22/2014   Procedure: Extraction of tooth #'s 1,3,4,5,6,7,8,9,10,11,12,13,14,15,16,20,21,22,24, 25,26,27,28,29,30,32 with alveoloplasty.;  Surgeon: Lenn Cal, DDS;  Location: [place];  Service: Oral Surgery;  Laterality: N/A;     Current Meds  Medication Sig  . allopurinol (ZYLOPRIM) 100 MG tablet Take 1 tablet (100 mg total) by mouth daily.  Marland Kitchen amLODipine (NORVASC) 5 MG tablet TAKE 1 TABLET ONCE DAILY.  Marland Kitchen aspirin 81 MG tablet Take 81 mg by mouth daily.  Marland Kitchen atorvastatin (LIPITOR) 80 MG tablet Take 1 tablet (80 mg total) by mouth daily at 6 PM. Pt must keep upcoming appt in June to receive further refills  . colchicine 0.6 MG tablet Take 1 tablet (0.6 mg total) by mouth daily. (Patient taking differently: Take 0.6 mg by mouth daily as needed. )  . Insulin Detemir (LEVEMIR FLEXTOUCH) 100 UNIT/ML Pen INJECT 50 UNITS SUBCUTANEOUSLY ONCE A DAY AT 10PM.  . liraglutide (VICTOZA) 18 MG/3ML SOPN INJECT 0.2ML (1.2MG ) INTO THE SKIN ONCE DAILY. (Patient taking differently: INJECT 0.18ML  INTO THE SKIN ONCE DAILY.)  . losartan (COZAAR) 100 MG tablet Take 100 mg by mouth daily.  . metFORMIN (GLUCOPHAGE) 1000 MG tablet TAKE 1 TABLET BY MOUTH TWICE DAILY WITH FOOD.  Marland Kitchen metoprolol (TOPROL-XL) 200 MG 24 hr tablet TAKE 1 TABLET BY MOUTH ONCE DAILY.  Marland Kitchen Misc Natural Products (BLACK CHERRY CONCENTRATE PO) Take 1 tablet by mouth daily as needed (gout flare-up).   Marland Kitchen OVER THE COUNTER MEDICATION Take 1 tablet by mouth daily. Osteobioflex   . spironolactone (ALDACTONE) 25 MG tablet TAKE (1/2) TABLET BY MOUTH DAILY.  Marland Kitchen torsemide (DEMADEX) 20 MG tablet TAKE 2 TABLETS BY MOUTH TWICE DAILY.  . traMADol (ULTRAM) 50 MG tablet Take 1-2 tablets (50-100 mg total) by mouth every 12 (twelve) hours as needed.     Allergies:   Nitroglycerin   Social History   Tobacco Use  . Smoking status: Former Smoker    Packs/day: 1.00    Years: 18.00     Pack years: 18.00    Types: Cigarettes    Start date: 01/04/1990    Last attempt to quit: 10/12/2007    Years since quitting: 11.4  . Smokeless tobacco: Never Used  Substance Use Topics  . Alcohol use: No    Alcohol/week: 0.0 standard drinks  . Drug use: No     Family Hx: The patient's family history includes Alcohol abuse in his maternal grandfather; Arthritis in his mother; Asthma in his mother; Diabetes in his brother; Heart attack (age of onset: 16) in his brother; Heart disease in his maternal grandfather and maternal uncle.  ROS:   Please see the history of present illness.     All other systems reviewed and are negative.   Prior CV studies:   The following studies were reviewed today:  2016 Cath ANGIOGRAPHY:  Left main:  Moderate size vessel which trifurcated into an LAD and intermediate vessel and left circumflex coronary artery.  LAD: The LAD was subtotally occluded at its ostium and there was diffuse 95-99% ostial proximal stenosis and then was totally occluded in the region of the first diagonal Willett Lefeber. There was a gap and then faint filling of a second diagonal Lakechia Nay which had a stent and there was faint filling of the LAD beyond the diagonal vessel via collaterals.  [place] Intermediate: Small caliber vessel free of significant disease.  Left circumflex: Large caliber vessel that gave rise to 2 major marginal branches and then in the posterior lateral like coronary artery. The first marginal Retina Bernardy was moderate size and had proximal 90% followed by 80% stenoses. A distal superior Katrine Radich had 95% stenosis.  Right coronary artery: Moderate size vessel that had 95% mid stenosis and 80% distal stenosis in the region of the acute margin. The vessel supplied the PDA. There were septal collaterals to the LAD.   Left ventriculography revealed dilated ventricle with an ejection fraction of 25% with diffuse global hypokinesis   IMPRESSION:  Severe ischemic  dilated cardiomyopathy with an ejection fraction approximately 25%.  Severe multivessel coronary obstructive disease with diffusely diseased subtotal occlusion of the ostial and proximal LAD with faint collaterals to the first and second diagonal vessel and more distal LAD; left circumflex coronary artery with tandem 90 and 80% obtuse marginal 1 stenosis with distal 95% stenosis in this distal superior Azula Zappia of this marginal vessel; and 95% mid RCA stenosis with 80% stenosis in the region of the acute margin with evidence for septal collaterals from the PDA to the LAD.  RECOMMENDATION:  The patient has surgical anatomy and CABG revascularization surgery will be recommended with surgical consultation in the morning. The patient was started on Aggrastat post procedure to reduce likelihood of progressive thrombosis. An attempt was made to initiate low-dose IV nitroglycerin, but the patient transiently dropped his blood pressure and this was discontinued.  10/2014 TEE Study Conclusions  - Left ventricle: Septal apical and anterior wall hypokinesis The cavity size was severely dilated. Systolic function was severely reduced. The estimated ejection fraction was in the range of 25% to 30%. - Aortic valve: There was trivial regurgitation. - Left atrium: The atrium was mildly dilated. - Atrial septum: No defect or patent foramen ovale was identified. - Pericardium, extracardiac: Small pericardial effusion IVC flat no evidence of tamponade.    02/2015 Echo Study Conclusions  - Left ventricle: The cavity size was mildly dilated. There was mild concentric hypertrophy. Systolic function was moderately to severely reduced. The estimated ejection fraction was in the range of 30% to 35%. Contrast enhancement utilized. Doppler parameters are consistent with abnormal left ventricular relaxation (grade 1 diastolic dysfunction). Doppler parameters are consistent with high  ventricular filling pressure. - Regional wall motion abnormality: Akinesis of the mid anterior, basal-mid anteroseptal, apical septal, and apical myocardium; severe hypokinesis of the mid inferoseptal myocardium; moderate hypokinesis of the apical lateral myocardium; mild hypokinesis of the mid anterolateral myocardium. - Aortic valve: Mildly to moderately calcified annulus. - Aorta: Mild to moderate aortic root dilatation. Aortic root dimension: 44 mm (ED). - Mitral valve: Mildly calcified annulus. Mildly thickened leaflets . - Left atrium: The atrium was mildly dilated. Volume/bsa, ES, (1-plane Simpson&'s, A2C): 33.2 ml/m^2. - Right ventricle: Systolic function was mildly reduced.  5/3/1/7 Clinic EKG (performed and reviewed in clinic) Sinus tach  Labs/Other Tests and Data Reviewed:    EKG:  No ECG reviewed.  Recent Labs: 03/14/2019: ALT 43; BUN 21; Creatinine, Ser 1.29; Hemoglobin 16.0; Platelets 174; Potassium 5.0; Sodium 132   Recent Lipid Panel Lab Results  Component Value Date/Time   CHOL 186 03/14/2019 10:32 AM   TRIG 249 (H) 03/14/2019 10:32 AM   HDL 29 (L) 03/14/2019 10:32 AM   CHOLHDL 6.4 (H) 03/14/2019 10:32 AM   CHOLHDL 7.0 10/12/2014 04:30 PM   LDLCALC 107 (H) 03/14/2019 10:32 AM    Wt Readings from Last 3 Encounters:  03/18/19 (!) 319 lb (144.7 kg)  03/14/19 (!) 345 lb 12.8 oz (156.9 kg)  12/04/18 (!) 350 lb 9.6 oz (159 kg)     Objective:    Vital Signs:  BP (!) 150/87   Ht 5\' 11"  (1.803 m)   Wt (!) 319 lb (144.7 kg)   BMI 44.49 kg/m     Well nourished middle aged male sitting comfortable in no apparent distress. Normal affect. Normal speech pattern and tone. No audible or visual signs of SOB or wheezing.   ASSESSMENT & PLAN:    1. CAD/ Chronic systolic HF/ICM - previous MI, found to have severe multivessel disease, s/p 4 vessel CABG Jan 2016. Echo 02/2015 LVEF 30-35% - he has elected to hold off on ICD for now  - management has  been limited due to poor medication compliance. Sherryll Burgerntresto was too expesnsive, back on ARB alone - overall stable without significant symptoms. Would like to repeat echo in near future once COVID-19 risk has further decreased  2. HTN - consistently above goal, even on days he takes his meds - increase norvasc to 10mg  daily  3. DM2 - poorly controlled, followed by pcp - from cardiovascular standpoint continue statin and ARB - of note proteinuria noted on last urine testing, he is on ARB  4. Hyperlipidemia - LDL above goal, this is due to poor compliance as opposed to insufficient dosing - continue current statin, encouraged increased compliance.      COVID-19 Education: The signs and symptoms of COVID-19 were discussed with the patient and how to seek care for testing (follow up with PCP or arrange E-visit).  The importance of social distancing was discussed today.  Time:   Today, I have spent 17 minutes with the patient with telehealth technology discussing the above problems.     Medication Adjustments/Labs and Tests Ordered: Current medicines are reviewed at length with the patient today.  Concerns regarding medicines are outlined above.   Tests Ordered: No orders of the defined types were placed in this encounter.   Medication Changes: No orders of the defined types were placed in this encounter.   Disposition:  Follow up 3 months  Signed, Dina RichBranch, Satya Bohall, MD  03/18/2019 8:19 AM    [facility]

## 2019-03-19 ENCOUNTER — Other Ambulatory Visit: Payer: Self-pay | Admitting: Cardiology

## 2019-03-22 ENCOUNTER — Telehealth: Payer: Medicare Other | Admitting: *Deleted

## 2019-03-24 ENCOUNTER — Other Ambulatory Visit: Payer: Self-pay

## 2019-03-25 MED ORDER — METOPROLOL SUCCINATE ER 200 MG PO TB24: 200.0000 mg | ORAL_TABLET | Freq: Every day | ORAL | 1 refills | Status: DC

## 2019-04-11 ENCOUNTER — Other Ambulatory Visit: Payer: Self-pay | Admitting: Family

## 2019-04-11 DIAGNOSIS — M109 Gout, unspecified: Secondary | ICD-10-CM

## 2019-04-11 DIAGNOSIS — M7989 Other specified soft tissue disorders: Secondary | ICD-10-CM

## 2019-04-18 ENCOUNTER — Other Ambulatory Visit: Payer: Self-pay

## 2019-04-18 ENCOUNTER — Encounter: Payer: Self-pay | Admitting: Internal Medicine

## 2019-04-18 ENCOUNTER — Ambulatory Visit (INDEPENDENT_AMBULATORY_CARE_PROVIDER_SITE_OTHER): Payer: Medicare Other | Admitting: Internal Medicine

## 2019-04-18 DIAGNOSIS — G4733 Obstructive sleep apnea (adult) (pediatric): Secondary | ICD-10-CM

## 2019-04-18 DIAGNOSIS — I2510 Atherosclerotic heart disease of native coronary artery without angina pectoris: Secondary | ICD-10-CM

## 2019-04-18 NOTE — Patient Instructions (Signed)
We can continue CPAP auto 5-20, mask of choice, humidifier, supplies, AirView  Please keep trying to eat less and walk more so you get your weight down- it will help you feel better.  Please call if we can help

## 2019-04-18 NOTE — Progress Notes (Signed)
HPI M former smoker followed for OSA, complicated by CAD/ MI/ CABG/ CHF, DM2 Unattended Home Sleep study 03/25/15 Mild OSA, AHI 6.1/ hr, desat to 79%, weight 333 lbs  -------------------------------------------------------------------------------------------------  01/01/18- 48 year old male former smoker followed for OSA, complicated by CAD/MI, DM 2 CPAP 5-20/ Mark Leach Admits to using CPAP "on and off" with no real explanation except that he does not bother to put it on. Download compliance 36%, AHI 0.9/hour. Bedtime between 10 PM and midnight, up around 5 AM, "sometimes" naps.  Denies sleeping medicines or caffeine. Son drove him today "he is my transportation". He denies any acute medical problems or breathing discomfort.  04/18/2019- 48 year-old male former smoker followed for OSA, complicated by CAD/MI, DM 2 CPAP 5-20/ Mark Leach Body weight today 345 lbs Download 77% compliance, AHI 1.3/ hr -----OSA on CPAP auto 5-10, DME: [facility], using CPAP nightly and during naps, no complaints Reviewed download. He didn't take CPAP on short trips out of town. Otherwise does well- stops his snoring.   ROS-see HPI   + = positive Constitutional:    weight loss, night sweats, fevers, chills, fatigue, lassitude. HEENT:    headaches, difficulty swallowing, tooth/dental problems, sore throat,       sneezing, itching, ear ache, nasal congestion, post nasal drip, snoring CV:    chest pain, orthopnea, PND, swelling in lower extremities, anasarca,                                                dizziness, palpitations Resp:  + shortness of breath with exertion or at rest.                productive cough,   non-productive cough, coughing up of blood.              change in color of mucus.  wheezing.   Skin:    rash or lesions. GI:  No-   heartburn, indigestion, abdominal pain, nausea, vomiting,  GU: . MS:  + joint pain, stiffness,  Neuro-     nothing unusual Psych:  change in mood or affect.  +depression or  anxiety.   memory loss.  OBJ- Physical Exam   + morbidly obese General- Alert, Oriented, Affect-appropriate, Distress- none acute,  +passive affect Skin- rash-none, lesions- none, excoriation- none Lymphadenopathy- none Head- atraumatic            Eyes- Gross vision intact, PERRLA, conjunctivae and secretions clear            Ears- Hearing, canals-normal            Nose- Clear, no-Septal dev, mucus, polyps, erosion, perforation             Throat- Mallampati II , mucosa clear , drainage- none, tonsils- atrophic Neck- flexible , trachea midline, no stridor , thyroid nl, carotid no bruit Chest - symmetrical excursion , unlabored           Heart/CV- RRR , no murmur , no gallop  , no rub, nl s1 s2                           - JVD- none , edema+1, stasis changes- none, varices- none           Lung- clear to P&A, wheeze- none, cough- none , dullness-none, rub- none  Chest wall-  Abd-  Br/ Gen/ Rectal- Not done, not indicated Extrem- + stasis changes and scars on lower extremities, heavy legs Neuro- grossly intact to observation

## 2019-04-19 NOTE — Assessment & Plan Note (Signed)
Benefits from CPAP. Educated on importance of strict compliance given his cardiac hx. Plan- continue CPAP auto 5-20

## 2019-04-19 NOTE — Assessment & Plan Note (Signed)
Reinforced importance of weight management, regular walking for exercise.

## 2019-04-30 ENCOUNTER — Other Ambulatory Visit: Payer: Self-pay | Admitting: Cardiology

## 2019-04-30 ENCOUNTER — Other Ambulatory Visit: Payer: Self-pay | Admitting: Family

## 2019-05-16 ENCOUNTER — Ambulatory Visit: Payer: Medicare Other | Admitting: Family

## 2019-06-20 ENCOUNTER — Ambulatory Visit: Payer: Medicare Other | Admitting: Family

## 2019-06-21 ENCOUNTER — Encounter: Payer: Self-pay | Admitting: Cardiology

## 2019-06-21 ENCOUNTER — Telehealth (INDEPENDENT_AMBULATORY_CARE_PROVIDER_SITE_OTHER): Payer: Medicare Other | Admitting: Cardiology

## 2019-06-21 VITALS — BP 153/80 | Ht 71.0 in | Wt 344.0 lb

## 2019-06-21 DIAGNOSIS — E782 Mixed hyperlipidemia: Secondary | ICD-10-CM

## 2019-06-21 DIAGNOSIS — I10 Essential (primary) hypertension: Secondary | ICD-10-CM

## 2019-06-21 DIAGNOSIS — I5022 Chronic systolic (congestive) heart failure: Secondary | ICD-10-CM

## 2019-06-21 NOTE — Progress Notes (Signed)
Virtual Visit via Telephone Note   This visit type was conducted due to national recommendations for restrictions regarding the COVID-19 Pandemic (e.g. social distancing) in an effort to limit this patient's exposure and mitigate transmission in our community.  Due to his co-morbid illnesses, this patient is at least at moderate risk for complications without adequate follow up.  This format is felt to be most appropriate for this patient at this time.  The patient did not have access to video technology/had technical difficulties with video requiring transitioning to audio format only (telephone).  All issues noted in this document were discussed and addressed.  No physical exam could be performed with this format.  Please refer to the patient's chart for his  consent to telehealth for [facility].   Date:  06/21/2019   ID:  Mark Leach, DOB 1971/02/08, MRN [id number]  Patient Location: Home Provider Location: Office  PCP:  Junie Spencer, FNP  Cardiologist:  Dina Rich, MD  Electrophysiologist:  None   Evaluation Performed:  Follow-Up Visit  Chief Complaint:  Follow up  History of Present Illness:    Mark Leach is a 48 y.o. male  seen today for follow up of the following medical problems.   1. CAD/Chronic systolic HF/ICM - hx of prior anterior STEMI in April 2014 at [facility], received stent to LAD - admit Jan 2016 with MI, cath severe CAD as described below with LVEF by LV gram 25% referred for CABG - s/p CABG Jan 2016 (LIMA-LAD, SVG-diag, SVG-PDA,SVG-OM) - 02/2015 echo: LVEF 30-35% - seen by EP 02/2015 for ICD evaluation, patient requested to hold off at that time.     - no recent SOb/DOE. Occasional swelling at times - compliant with meds. Weights are stable 340-345. He is on torsemide 40mg  bid. Reported weights at last visit of 319 appear to be an error as he was 345 just days before that visit  2. OSA - compliant with cpap -  Followed by pulmonary Dr  Maple Hudson  3. Hyperlipidemia - mixed compliance with statin,. Though taking more regularly since we last spoke 03/2019 TC 186 TG 249 HDL 29 LDL 107  4. HTN - mixed compliance with meds - has not taken meds yet today  5. DM2 - mixed compliance with therapy - 03/2019 HgbA1c>14  - has f/u with pcp later this month  The patient does not have symptoms concerning for COVID-19 infection (fever, chills, cough, or new shortness of breath).    Past Medical History:  Diagnosis Date  . Chronic systolic CHF (congestive heart failure) (HCC)   . Coronary artery disease 01/2013   anterior MI with cath showing 95% pLAD, 90% diag, 30% OM1, 70% mRCA, 60% dRCA s/p PCI with DES to LAD and diag and EF 40%  . Diabetes mellitus type 2, uncontrolled, with complications (HCC)   . DJD (degenerative joint disease)    knees  . Hypertension   . Ischemic dilated cardiomyopathy (HCC)   . Morbid obesity (HCC)   . 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 Stroke [facility]) 10/12/2014   Patient with several month h/o left sided visual field deficit and MRI of brain revealing:  1. Medial right occipital lobe subacute infarct. 2. At least 3 punctate areas of acute nonhemorrhagic infarct are present involving the left thalamus, high posterior right frontal lobe and high a medial posterior left frontal or  parietal lobe. 3. Additional white matter changes are noted beyond the areas of  . Visual field defect 10/12/2014   Past Surgical History:  Procedure Laterality Date  . CARDIAC CATHETERIZATION  01/2013   [facility] [place]  . CHOLECYSTECTOMY    . CORONARY ANGIOPLASTY  01/2013   PCI with stenting of LAD  . CORONARY ARTERY BYPASS GRAFT N/A 10/13/2014   Procedure: CORONARY ARTERY BYPASS GRAFTING (CABG), ON PUMP, TIMES FOUR, USING LEFT INTERNAL MAMMARY ARTERY, BILATERAL GREATER SAPHENOUS VEINS HARVESTED ENDOSCOPICALLY;  Surgeon: Purcell Nailslarence H Owen, MD;  Location: [facility] OR;   Service: Open Heart Surgery;  Laterality: N/A;  . LEFT HEART CATH N/A 10/10/2014   Procedure: LEFT HEART CATH;  Surgeon: Lennette Biharihomas A Kelly, MD;  [facility];  Service: Cardiovascular;  Laterality: N/A;  . MULTIPLE EXTRACTIONS WITH ALVEOLOPLASTY N/A 10/22/2014   Procedure: Extraction of tooth #'s 1,3,4,5,6,7,8,9,10,11,12,13,14,15,16,20,21,22,24, 25,26,27,28,29,30,32 with alveoloplasty.;  Surgeon: Charlynne Panderonald F Kulinski, DDS;  [facility];  Service: Oral Surgery;  Laterality: N/A;     No outpatient medications have been marked as taking for the 06/21/19 encounter (Appointment) with Antoine PocheBranch, Dyan Labarbera F, MD.     Allergies:   Nitroglycerin   Social History   Tobacco Use  . Smoking status: Former Smoker    Packs/day: 1.00    Years: 18.00    Pack years: 18.00    Types: Cigarettes    Start date: 01/04/1990    Quit date: 10/12/2007    Years since quitting: 11.6  . Smokeless tobacco: Never Used  Substance Use Topics  . Alcohol use: No    Alcohol/week: 0.0 standard drinks  . Drug use: No     Family Hx: The patient's family history includes Alcohol abuse in his maternal grandfather; Arthritis in his mother; Asthma in his mother; Diabetes in his brother; Heart attack (age of onset: 2037) in his brother; Heart disease in his maternal grandfather and maternal uncle.  ROS:   Please see the history of present illness.     All other systems reviewed and are negative.   Prior CV studies:   The following studies were reviewed today:  2016 Cath ANGIOGRAPHY:  Left main: Moderate size vessel which trifurcated into an LAD and intermediate vessel and left circumflex coronary artery.  LAD: The LAD was subtotally occluded at its ostium and there was diffuse 95-99% ostial proximal stenosis and then was totally occluded in the region of the first diagonal Francisco Ostrovsky. There was a gap and then faint filling of a second diagonal Tempest Frankland which had a stent and there was faint filling of the LAD beyond the  diagonal vessel via collaterals.  Ramus Intermediate: Small caliber vessel free of significant disease.  Left circumflex: Large caliber vessel that gave rise to 2 major marginal branches and then in the posterior lateral like coronary artery. The first marginal Bera Pinela was moderate size and had proximal 90% followed by 80% stenoses. A distal superior Delylah Stanczyk had 95% stenosis.  Right coronary artery: Moderate size vessel that had 95% mid stenosis and 80% distal stenosis in the region of the acute margin. The vessel supplied the PDA. There were septal collaterals to the LAD.   Left ventriculography revealed dilated ventricle with an ejection fraction of 25% with diffuse global hypokinesis   IMPRESSION:  Severe ischemic dilated cardiomyopathy with an ejection fraction approximately 25%.  Severe multivessel coronary obstructive disease with diffusely diseased subtotal occlusion of the ostial and proximal LAD with faint collaterals to the first and second diagonal vessel  and more distal LAD; left circumflex coronary artery with tandem 90 and 80% obtuse marginal 1 stenosis with distal 95% stenosis in this distal superior Alice Burnside of this marginal vessel; and 95% mid RCA stenosis with 80% stenosis in the region of the acute margin with evidence for septal collaterals from the PDA to the LAD.  RECOMMENDATION:  The patient has surgical anatomy and CABG revascularization surgery will be recommended with surgical consultation in the morning. The patient was started on Aggrastat post procedure to reduce likelihood of progressive thrombosis. An attempt was made to initiate low-dose IV nitroglycerin, but the patient transiently dropped his blood pressure and this was discontinued.  10/2014 TEE Study Conclusions  - Left ventricle: Septal apical and anterior wall hypokinesis The cavity size was severely dilated. Systolic function was severely reduced. The estimated ejection fraction was  in the range of 25% to 30%. - Aortic valve: There was trivial regurgitation. - Left atrium: The atrium was mildly dilated. - Atrial septum: No defect or patent foramen ovale was identified. - Pericardium, extracardiac: Small pericardial effusion IVC flat no evidence of tamponade.    02/2015 Echo Study Conclusions  - Left ventricle: The cavity size was mildly dilated. There was mild concentric hypertrophy. Systolic function was moderately to severely reduced. The estimated ejection fraction was in the range of 30% to 35%. Contrast enhancement utilized. Doppler parameters are consistent with abnormal left ventricular relaxation (grade 1 diastolic dysfunction). Doppler parameters are consistent with high ventricular filling pressure. - Regional wall motion abnormality: Akinesis of the mid anterior, basal-mid anteroseptal, apical septal, and apical myocardium; severe hypokinesis of the mid inferoseptal myocardium; moderate hypokinesis of the apical lateral myocardium; mild hypokinesis of the mid anterolateral myocardium. - Aortic valve: Mildly to moderately calcified annulus. - Aorta: Mild to moderate aortic root dilatation. Aortic root dimension: 44 mm (ED). - Mitral valve: Mildly calcified annulus. Mildly thickened leaflets . - Left atrium: The atrium was mildly dilated. Volume/bsa, ES, (1-plane Simpson&'s, A2C): 33.2 ml/m^2. - Right ventricle: Systolic function was mildly reduced.  5/3/1/7 Clinic EKG (performed and reviewed in clinic) Sinus tach  Labs/Other Tests and Data Reviewed:    EKG:  No ECG reviewed.  Recent Labs: 03/14/2019: ALT 43; BUN 21; Creatinine, Ser 1.29; Hemoglobin 16.0; Platelets 174; Potassium 5.0; Sodium 132   Recent Lipid Panel Lab Results  Component Value Date/Time   CHOL 186 03/14/2019 10:32 AM   TRIG 249 (H) 03/14/2019 10:32 AM   HDL 29 (L) 03/14/2019 10:32 AM   CHOLHDL 6.4 (H) 03/14/2019 10:32 AM   CHOLHDL 7.0  10/12/2014 04:30 PM   LDLCALC 107 (H) 03/14/2019 10:32 AM    Wt Readings from Last 3 Encounters:  04/18/19 (!) 345 lb 6.4 oz (156.7 kg)  03/18/19 (!) 319 lb (144.7 kg)  03/14/19 (!) 345 lb 12.8 oz (156.9 kg)     Objective:    Vital Signs:   Today's Vitals   06/21/19 0830  BP: (!) 153/80  Weight: (!) 344 lb (156 kg)  Height: 5\' 11"  (1.803 m)   Body mass index is 47.98 kg/m.  Normal affect. Normal speech pattern and tone. Comfortable, no apparent distress. No audible signs of SOB or wheezing.   ASSESSMENT & PLAN:    1. CAD/ Chronic systolic HF/ICM - previous MI, found to have severe multivessel disease, s/p 4 vessel CABG Jan 2016. Echo 02/2015 LVEF 30-35% - he has elected to hold off on ICD for now  - management has been limited due to poor medication compliance.  Delene Loll was too expesnsive, back on ARB alone - weights are stable, no significant symptoms. Has been a few years since we last checked his LVEF, we will repeat echo.   2. HTN - elevated today but has not taken meds yet - he will call Monday with bp log from over the weekend.    3. Hyperlipidemia - LDL above goal, this is due to poor compliance as opposed to insufficient dosing - he reports increased compliance, will continue to monitor lipids at this time.   COVID-19 Education: The signs and symptoms of COVID-19 were discussed with the patient and how to seek care for testing (follow up with PCP or arrange E-visit).  The importance of social distancing was discussed today.  Time:   Today, I have spent 15 minutes with the patient with telehealth technology discussing the above problems.     Medication Adjustments/Labs and Tests Ordered: Current medicines are reviewed at length with the patient today.  Concerns regarding medicines are outlined above.   Tests Ordered: No orders of the defined types were placed in this encounter.   Medication Changes: No orders of the defined types were placed in this  encounter.   Follow Up:  Virtual Visit in 4 month(s)  Signed, Carlyle Dolly, MD  06/21/2019 8:16 AM    [place]

## 2019-06-21 NOTE — Addendum Note (Signed)
Addended by: Julian Hy T on: 06/21/2019 09:00 AM   Modules accepted: Orders

## 2019-06-21 NOTE — Patient Instructions (Addendum)
Yes we doYour physician recommends that you schedule a follow-up appointment in: [place]  Your physician recommends that you continue on your current medications as directed. Please refer to the Current Medication list given to you today.  Your physician has requested that you have an echocardiogram. Echocardiography is a painless test that uses sound waves to create images of your heart. It provides your doctor with information about the size and shape of your heart and how well your heart's chambers and valves are working. This procedure takes approximately one hour. There are no restrictions for this procedure.  Your physician has requested that you regularly monitor and record your blood pressure readings at home DAILY AND UPDATE [place] WITH BLOOD PRESSURE ON Monday   Thank you for choosing [facility]!!

## 2019-06-22 ENCOUNTER — Other Ambulatory Visit: Payer: Self-pay | Admitting: Family

## 2019-06-23 ENCOUNTER — Other Ambulatory Visit: Payer: Self-pay | Admitting: Cardiology

## 2019-06-28 ENCOUNTER — Other Ambulatory Visit: Payer: Self-pay | Admitting: Family

## 2019-07-08 ENCOUNTER — Ambulatory Visit: Payer: Medicare Other | Admitting: Family

## 2019-07-19 ENCOUNTER — Other Ambulatory Visit: Payer: Self-pay | Admitting: *Deleted

## 2019-07-19 MED ORDER — ALLOPURINOL 100 MG PO TABS: 100.0000 mg | ORAL_TABLET | Freq: Every day | ORAL | 0 refills | Status: DC

## 2019-07-22 ENCOUNTER — Ambulatory Visit: Payer: Medicare Other | Admitting: Family

## 2019-07-26 ENCOUNTER — Other Ambulatory Visit: Payer: Self-pay | Admitting: Cardiology

## 2019-07-31 ENCOUNTER — Other Ambulatory Visit: Payer: Medicare Other

## 2019-08-08 ENCOUNTER — Encounter: Payer: Self-pay | Admitting: Family

## 2019-08-08 ENCOUNTER — Ambulatory Visit (INDEPENDENT_AMBULATORY_CARE_PROVIDER_SITE_OTHER): Payer: Medicare Other | Admitting: Family

## 2019-08-08 DIAGNOSIS — Z951 Presence of aortocoronary bypass graft: Secondary | ICD-10-CM

## 2019-08-08 DIAGNOSIS — IMO0002 Reserved for concepts with insufficient information to code with codable children: Secondary | ICD-10-CM

## 2019-08-08 DIAGNOSIS — E1165 Type 2 diabetes mellitus with hyperglycemia: Secondary | ICD-10-CM

## 2019-08-08 DIAGNOSIS — Z0289 Encounter for other administrative examinations: Secondary | ICD-10-CM

## 2019-08-08 DIAGNOSIS — E118 Type 2 diabetes mellitus with unspecified complications: Secondary | ICD-10-CM

## 2019-08-08 DIAGNOSIS — R059 Cough, unspecified: Secondary | ICD-10-CM

## 2019-08-08 DIAGNOSIS — E1169 Type 2 diabetes mellitus with other specified complication: Secondary | ICD-10-CM | POA: Diagnosis not present

## 2019-08-08 DIAGNOSIS — I152 Hypertension secondary to endocrine disorders: Secondary | ICD-10-CM

## 2019-08-08 DIAGNOSIS — I2510 Atherosclerotic heart disease of native coronary artery without angina pectoris: Secondary | ICD-10-CM

## 2019-08-08 DIAGNOSIS — M8949 Other hypertrophic osteoarthropathy, multiple sites: Secondary | ICD-10-CM

## 2019-08-08 DIAGNOSIS — I10 Essential (primary) hypertension: Secondary | ICD-10-CM | POA: Diagnosis not present

## 2019-08-08 DIAGNOSIS — R05 Cough: Secondary | ICD-10-CM

## 2019-08-08 DIAGNOSIS — G4733 Obstructive sleep apnea (adult) (pediatric): Secondary | ICD-10-CM

## 2019-08-08 DIAGNOSIS — E1159 Type 2 diabetes mellitus with other circulatory complications: Secondary | ICD-10-CM

## 2019-08-08 DIAGNOSIS — F331 Major depressive disorder, recurrent, moderate: Secondary | ICD-10-CM

## 2019-08-08 DIAGNOSIS — M150 Primary generalized (osteo)arthritis: Secondary | ICD-10-CM

## 2019-08-08 DIAGNOSIS — F1120 Opioid dependence, uncomplicated: Secondary | ICD-10-CM

## 2019-08-08 DIAGNOSIS — Z9119 Patient's noncompliance with other medical treatment and regimen: Secondary | ICD-10-CM

## 2019-08-08 DIAGNOSIS — E785 Hyperlipidemia, unspecified: Secondary | ICD-10-CM

## 2019-08-08 DIAGNOSIS — Z91199 Patient's noncompliance with other medical treatment and regimen due to unspecified reason: Secondary | ICD-10-CM

## 2019-08-08 DIAGNOSIS — M159 Polyosteoarthritis, unspecified: Secondary | ICD-10-CM

## 2019-08-08 NOTE — Progress Notes (Signed)
Virtual Visit via telephone Note Due to COVID-19 pandemic this visit was conducted virtually. This visit type was conducted due to national recommendations for restrictions regarding the COVID-19 Pandemic (e.g. social distancing, sheltering in place) in an effort to limit this patient's exposure and mitigate transmission in our community. All issues noted in this document were discussed and addressed.  A physical exam was not performed with this format.  I connected with Mark Leach on 08/08/19 at 11:55 AM by telephone and verified that I am speaking with the correct person using two identifiers. Mark Leach is currently located at home and no one is currently with him during visit. The provider, Evelina Dun, FNP is located in their office at time of visit.  I discussed the limitations, risks, security and privacy concerns of performing an evaluation and management service by telephone and the availability of in person appointments. I also discussed with the patient that there may be a patient responsible charge related to this service. The patient expressed understanding and agreed to proceed.   History and Present Illness:  Pt  Calls  the office today for chronic follow up. Pt is followed by his Cardiologists every 3 month for CHF, HX of CABG, and HX of stroke and MI. Pt states he is suppose to weight himself daily, but he is noncompliant. Has ECHO scheduled next week.  PT still has not followed up with Endo for uncontrolled DM2.States he missed his last appt and never rescheduled.Pt is noncompliant with his diet and medications. Diabetes He presents for his follow-up diabetic visit. He has type 2 diabetes mellitus. His disease course has been stable. Pertinent negatives for hypoglycemia include no headaches. Associated symptoms include fatigue and foot paresthesias. Pertinent negatives for diabetes include no visual change. Symptoms are stable. Diabetic complications include a CVA,  heart disease and nephropathy. Risk factors for coronary artery disease include dyslipidemia, diabetes mellitus, male sex, hypertension and sedentary lifestyle. He is following a generally unhealthy diet. His overall blood glucose range is 180-200 mg/dl. Eye exam is current.  Hypertension This is a chronic problem. The current episode started more than 1 year ago. The problem has been waxing and waning since onset. The problem is controlled. Associated symptoms include malaise/fatigue, peripheral edema and shortness of breath. Pertinent negatives include no headaches. Risk factors for coronary artery disease include dyslipidemia, diabetes mellitus, obesity, male gender and sedentary lifestyle. The current treatment provides moderate improvement. Hypertensive end-organ damage includes CVA.  Hyperlipidemia This is a chronic problem. The current episode started more than 1 year ago. The problem is uncontrolled. Recent lipid tests were reviewed and are high. Exacerbating diseases include obesity. Associated symptoms include shortness of breath. Current antihyperlipidemic treatment includes statins. The current treatment provides moderate improvement of lipids. Risk factors for coronary artery disease include diabetes mellitus, dyslipidemia, male sex, hypertension and a sedentary lifestyle.  Depression        This is a chronic problem.  The onset quality is gradual.   The problem occurs intermittently.  The problem has been waxing and waning since onset.  Associated symptoms include fatigue, irritable, restlessness, decreased interest and sad.  Associated symptoms include no helplessness, no hopelessness and no headaches. Congestive Heart Failure Presents for follow-up visit. Associated symptoms include edema, fatigue and shortness of breath. Pertinent negatives include no chest pressure or claudication. The symptoms have been stable.  OSA Uses CPAP every night. Stable.    Review of Systems   Constitutional: Positive for fatigue and malaise/fatigue.  Respiratory: Positive for shortness of breath.   Cardiovascular: Negative for claudication.  Neurological: Negative for headaches.  Psychiatric/Behavioral: Positive for depression.  All other systems reviewed and are negative.    Observations/Objective: No SOB or distress noted, intermittent cough noted, Pt reports 131/84  Assessment and Plan: Mark Leach comes in today with chief complaint of No chief complaint on file.   Diagnosis and orders addressed:  1. Hypertension associated with diabetes ([place]) - CMP14+EGFR; Future - CBC with Differential/Platelet; Future  2. Essential hypertension, benign - CMP14+EGFR; Future - CBC with Differential/Platelet; Future  3. Diabetes mellitus type 2, uncontrolled, with complications (HCC) - VHQ46+NGEX; Future - CBC with Differential/Platelet; Future - Bayer DCA Hb A1c Waived; Future - Microalbumin / creatinine urine ratio; Future  4. Hyperlipidemia associated with type 2 diabetes mellitus (HCC) - CMP14+EGFR; Future - CBC with Differential/Platelet; Future  5. Obstructive sleep apnea - CMP14+EGFR; Future - CBC with Differential/Platelet; Future  6. Primary osteoarthritis involving multiple joints - CMP14+EGFR; Future - CBC with Differential/Platelet; Future  7. S/P CABG x 4 - CMP14+EGFR; Future - CBC with Differential/Platelet; Future  8. Pain management contract signed - CMP14+EGFR; Future - CBC with Differential/Platelet; Future  9. Uncomplicated opioid dependence ([place]) - CMP14+EGFR; Future - CBC with Differential/Platelet; Future  10. Noncompliance - CMP14+EGFR; Future - CBC with Differential/Platelet; Future  11. Morbid obesity ([place]) - CMP14+EGFR; Future - CBC with Differential/Platelet; Future  12. Moderate episode of recurrent major depressive disorder (HCC) - CMP14+EGFR; Future - CBC with Differential/Platelet; Future  13. Cough Pt will get  COVID tested today   Labs pending, he will come in and get labs after his COVID tests results and is symptom free Health Maintenance reviewed Diet and exercise encouraged  Follow up plan: 3 months      I discussed the assessment and treatment plan with the patient. The patient was provided an opportunity to ask questions and all were answered. The patient agreed with the plan and demonstrated an understanding of the instructions.   The patient was advised to call back or seek an in-person evaluation if the symptoms worsen or if the condition fails to improve as anticipated.  The above assessment and management plan was discussed with the patient. The patient verbalized understanding of and has agreed to the management plan. Patient is aware to call the clinic if symptoms persist or worsen. Patient is aware when to return to the clinic for a follow-up visit. Patient educated on when it is appropriate to go to the emergency department.   Time call ended: 12:15 pm   I provided 20 minutes of non-face-to-face time during this encounter.    Evelina Dun, FNP

## 2019-08-09 ENCOUNTER — Other Ambulatory Visit: Payer: Self-pay

## 2019-08-09 DIAGNOSIS — Z20822 Contact with and (suspected) exposure to covid-19: Secondary | ICD-10-CM

## 2019-08-09 DIAGNOSIS — Z20828 Contact with and (suspected) exposure to other viral communicable diseases: Secondary | ICD-10-CM | POA: Diagnosis not present

## 2019-08-10 LAB — NOVEL CORONAVIRUS, NAA: SARS-CoV-2, NAA: NOT DETECTED

## 2019-08-12 ENCOUNTER — Other Ambulatory Visit: Payer: Self-pay | Admitting: Family

## 2019-08-22 ENCOUNTER — Other Ambulatory Visit: Payer: Medicare Other

## 2019-08-22 ENCOUNTER — Other Ambulatory Visit: Payer: Self-pay | Admitting: Family

## 2019-08-26 DIAGNOSIS — G4733 Obstructive sleep apnea (adult) (pediatric): Secondary | ICD-10-CM

## 2019-08-30 ENCOUNTER — Other Ambulatory Visit: Payer: Self-pay | Admitting: Family

## 2019-08-30 DIAGNOSIS — F1120 Opioid dependence, uncomplicated: Secondary | ICD-10-CM

## 2019-08-30 DIAGNOSIS — M159 Polyosteoarthritis, unspecified: Secondary | ICD-10-CM

## 2019-08-30 DIAGNOSIS — Z0289 Encounter for other administrative examinations: Secondary | ICD-10-CM

## 2019-09-03 ENCOUNTER — Other Ambulatory Visit: Payer: Self-pay

## 2019-09-03 ENCOUNTER — Ambulatory Visit (INDEPENDENT_AMBULATORY_CARE_PROVIDER_SITE_OTHER): Payer: Medicare Other

## 2019-09-03 DIAGNOSIS — I5022 Chronic systolic (congestive) heart failure: Secondary | ICD-10-CM

## 2019-09-04 ENCOUNTER — Other Ambulatory Visit: Payer: Self-pay | Admitting: *Deleted

## 2019-09-04 ENCOUNTER — Telehealth: Payer: Self-pay | Admitting: *Deleted

## 2019-09-04 ENCOUNTER — Telehealth: Payer: Self-pay | Admitting: Cardiology

## 2019-09-04 DIAGNOSIS — I5022 Chronic systolic (congestive) heart failure: Secondary | ICD-10-CM

## 2019-09-04 MED ORDER — VICTOZA 18 MG/3ML ~~LOC~~ SOPN: 1.2000 mg | PEN_INJECTOR | Freq: Every day | SUBCUTANEOUS | 0 refills | Status: DC

## 2019-09-04 NOTE — Telephone Encounter (Signed)
Pre-cert Verification for the following procedure    Limited echo with contrast scheduled for 09-13-2019 at [facility]

## 2019-09-04 NOTE — Telephone Encounter (Signed)
Pt agreeable to echo w/contrast - orders placed and will forward to schedulers

## 2019-09-04 NOTE — Telephone Encounter (Signed)
-----   Message from Herminio Commons, MD sent at 09/03/2019 12:57 PM EST ----- Well left ventricular systolic function looks decreased, EF assessment is difficult due to poor visualization.  I would recommend a follow-up limited echocardiogram with contrast.

## 2019-09-13 ENCOUNTER — Ambulatory Visit (HOSPITAL_COMMUNITY)
Admission: RE | Admit: 2019-09-13 | Discharge: 2019-09-13 | Disposition: A | Discharge status: Home / Self Care | Payer: Medicare Other | Source: Ambulatory Visit | Attending: Cardiovascular Disease | Admitting: Cardiovascular Disease

## 2019-09-13 ENCOUNTER — Other Ambulatory Visit: Payer: Self-pay

## 2019-09-13 DIAGNOSIS — I5022 Chronic systolic (congestive) heart failure: Secondary | ICD-10-CM | POA: Diagnosis not present

## 2019-09-13 MED ORDER — PERFLUTREN LIPID MICROSPHERE
1.0000 mL | INTRAVENOUS | Status: AC | PRN
  Administered 2019-09-13: 2 mL via INTRAVENOUS
  Administered 2019-09-13: 1 mL via INTRAVENOUS

## 2019-09-13 NOTE — Progress Notes (Signed)
I.V. removed @ 1610. Site was clean, dry and intact. Pressure was held and coflex bandage applied. Patient stated he felt fine and was discharged.   Patient stated he had not taken his BP meds today. BP was elevated and patient stated he felt fine. Dr. Harl Bowie stated as long as patient was asymptomatic he could leave. Patient encouraged to take meds when he got home tonight.   Alvino Chapel, RCS

## 2019-09-13 NOTE — Progress Notes (Signed)
*  PRELIMINARY RESULTS* Echocardiogram Limited 2-D Echocardiogram has been performed with Definity.  [name] 09/13/2019, 4:02 PM

## 2019-09-14 ENCOUNTER — Other Ambulatory Visit: Payer: Self-pay | Admitting: Family

## 2019-09-23 ENCOUNTER — Telehealth: Payer: Self-pay | Admitting: *Deleted

## 2019-09-23 NOTE — Telephone Encounter (Signed)
Pt voiced understanding - routed to pcp  

## 2019-09-23 NOTE — Telephone Encounter (Signed)
-----   Message from Arnoldo Lenis, MD sent at 09/23/2019 11:03 AM EST ----- Some mild improvement in heart function, now 35-40% (normal is 50-60%, he had been 30-35%). Continue current meds   Zandra Abts MD

## 2019-09-24 MED FILL — Perflutren Lipid Microsphere IV Susp 1.1 MG/ML: INTRAVENOUS | Qty: 10 | Status: AC

## 2019-09-30 ENCOUNTER — Other Ambulatory Visit: Payer: Self-pay | Admitting: Cardiology

## 2019-10-16 ENCOUNTER — Other Ambulatory Visit: Payer: Self-pay | Admitting: Family

## 2019-10-25 ENCOUNTER — Ambulatory Visit: Payer: Medicare Other | Admitting: Cardiology

## 2019-10-25 ENCOUNTER — Other Ambulatory Visit: Payer: Self-pay | Admitting: Cardiology

## 2019-10-30 ENCOUNTER — Other Ambulatory Visit: Payer: Self-pay | Admitting: Cardiology

## 2019-10-31 ENCOUNTER — Ambulatory Visit (INDEPENDENT_AMBULATORY_CARE_PROVIDER_SITE_OTHER): Payer: Medicare Other | Admitting: Cardiology

## 2019-10-31 ENCOUNTER — Encounter: Payer: Self-pay | Admitting: Cardiology

## 2019-10-31 ENCOUNTER — Other Ambulatory Visit: Payer: Self-pay

## 2019-10-31 VITALS — BP 113/77 | HR 90 | Ht 71.0 in | Wt 338.6 lb

## 2019-10-31 DIAGNOSIS — E782 Mixed hyperlipidemia: Secondary | ICD-10-CM | POA: Diagnosis not present

## 2019-10-31 DIAGNOSIS — I2510 Atherosclerotic heart disease of native coronary artery without angina pectoris: Secondary | ICD-10-CM

## 2019-10-31 DIAGNOSIS — I5022 Chronic systolic (congestive) heart failure: Secondary | ICD-10-CM | POA: Diagnosis not present

## 2019-10-31 DIAGNOSIS — I10 Essential (primary) hypertension: Secondary | ICD-10-CM | POA: Diagnosis not present

## 2019-10-31 NOTE — Patient Instructions (Signed)
Your physician recommends that you schedule a follow-up appointment in: 4 MONTHS WITH DR BRANCH  Your physician recommends that you continue on your current medications as directed. Please refer to the Current Medication list given to you today.  Thank you for choosing [facility]!!    

## 2019-10-31 NOTE — Progress Notes (Signed)
Clinical Summary Mark [place] is a 49 y.o.male seen today for follow up of the following medical problems.   1. CAD/Chronic systolic HF/ICM - hx of prior anterior STEMI in April 2014 at [facility], received stent to LAD - admit Jan 2016 with MI, cath severe CAD as described below with LVEF by LV gram 25% referred for CABG - s/p CABG Jan 2016 (LIMA-LAD, SVG-diag, SVG-PDA,SVG-OM) - 02/2015 echo: LVEF 30-35% - seen by EP 02/2015 for ICD evaluation, patient requested to hold off at that time.Has refused ICD since     09/2019 echo LVEF 35-40% - - no recent SOB. No recent edema - mixed compliant with meds     2. OSA - Followed by pulmonary Dr Annamaria Boots - using cpap    3. Hyperlipidemia 03/2019 TC 186 TG 249 HDL 29 LDL 107  - labs followed by pcp -= mixed compliant with statin    4. HTN - mixed medication compliance  5. DM2 - 03/2019 HgbA1c>14  - has f/u with pcp later this month   SH: moving to [place], [place] with his wife and kids later this year  Past Medical History:  Diagnosis Date  . Chronic systolic CHF (congestive heart failure) ([place])   . Coronary artery disease 01/2013   anterior MI with cath showing 95% pLAD, 90% diag, 30% OM1, 70% mRCA, 60% dRCA s/p PCI with DES to LAD and diag and EF 40%  . Diabetes mellitus type 2, uncontrolled, with complications ([place])   . DJD (degenerative joint disease)    knees  . Hypertension   . 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 Morbid obesity ([place])   . 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 Stroke [facility]) 10/12/2014   Patient with several month h/o left sided visual field deficit and MRI of brain revealing:  1. Medial right occipital lobe subacute infarct. 2. At least 3 punctate areas of acute nonhemorrhagic infarct are present involving the left thalamus, high posterior right frontal lobe and high a medial posterior left frontal or parietal lobe. 3. Additional  white matter changes are noted beyond the areas of  . Visual field defect 10/12/2014     Allergies  Allergen Reactions  . Nitroglycerin Other (See Comments)    Hypotension, syncope, bradycardia     Current Outpatient Medications  Medication Sig Dispense Refill  . allopurinol (ZYLOPRIM) 100 MG tablet Take 1 tablet (100 mg total) by mouth daily. 90 tablet 0  . amLODipine (NORVASC) 10 MG tablet Take 1 tablet (10 mg total) by mouth daily. 90 tablet 3  . aspirin 81 MG tablet Take 81 mg by mouth daily.    Marland Kitchen atorvastatin (LIPITOR) 80 MG tablet TAKE 1 TABLET BY MOUTH ONCE DAILY AT 6PM. MUST KEEP JUNE APPT FOR FURTHER REFILLS. 90 tablet 1  . blood glucose meter kit and supplies KIT Dispense based on patient and insurance preference. Use up to four times daily as directed. (FOR ICD-10 E11.65) 1 each 2  . colchicine 0.6 MG tablet TAKE 1 TABLET BY MOUTH ONCE DAILY. (Patient taking differently: 0.6 mg as needed. ) 20 tablet 0  . GLOBAL EASE INJECT PEN NEEDLES 31G X 8 MM MISC     . glucose blood test strip 1 each by Other route as needed. Use as instructed 2 x daily. E11.65 One Touch Verio 100 each 5  . Insulin Detemir (LEVEMIR FLEXTOUCH)  100 UNIT/ML Pen Inject 50 Units into the skin daily at 10 pm. (Please make January appt) 15 mL 0  . liraglutide (VICTOZA) 18 MG/3ML SOPN Inject 0.2 mLs (1.2 mg total) into the skin daily. 6 mL 0  . losartan (COZAAR) 100 MG tablet Take 100 mg by mouth daily.    . metFORMIN (GLUCOPHAGE) 1000 MG tablet TAKE 1 TABLET BY MOUTH TWICE DAILY WITH FOOD. 180 tablet 0  . metoprolol (TOPROL-XL) 200 MG 24 hr tablet TAKE 1 TABLET BY MOUTH ONCE DAILY. 90 tablet 1  . Misc Natural Products (BLACK CHERRY CONCENTRATE PO) Take 1 tablet by mouth daily as needed (gout flare-up).     Marland Kitchen OVER THE COUNTER MEDICATION Take 2 tablets by mouth daily. Osteobioflex     . spironolactone (ALDACTONE) 25 MG tablet TAKE (1/2) TABLET BY MOUTH DAILY. 45 tablet 1  . torsemide (DEMADEX) 20 MG tablet TAKE 2  TABLETS BY MOUTH TWICE DAILY. 120 tablet 3  . traMADol (ULTRAM) 50 MG tablet TAKE 1-2 TABLETS BY MOUTH EVERY 12 HOURS AS NEEDED. 120 tablet 2   No current facility-administered medications for this visit.     Past Surgical History:  Procedure Laterality Date  . CARDIAC CATHETERIZATION  01/2013   [facility] in [place]  . CHOLECYSTECTOMY    . CORONARY ANGIOPLASTY  01/2013   PCI with stenting of LAD  . CORONARY ARTERY BYPASS GRAFT N/A 10/13/2014   Procedure: CORONARY ARTERY BYPASS GRAFTING (CABG), ON PUMP, TIMES FOUR, USING LEFT INTERNAL MAMMARY ARTERY, BILATERAL GREATER SAPHENOUS VEINS HARVESTED ENDOSCOPICALLY;  Surgeon: Rexene Alberts, MD;  Location: [facility];  Service: Open Heart Surgery;  Laterality: N/A;  . LEFT HEART CATH N/A 10/10/2014   Procedure: LEFT HEART CATH;  Surgeon: Troy Sine, MD;  Location: [facility] CATH LAB;  Service: Cardiovascular;  Laterality: N/A;  . MULTIPLE EXTRACTIONS WITH ALVEOLOPLASTY N/A 10/22/2014   Procedure: Extraction of tooth #'s 1,3,4,5,6,7,8,9,10,11,12,13,14,15,16,20,21,22,24, 25,26,27,28,29,30,32 with alveoloplasty.;  Surgeon: Lenn Cal, DDS;  Location: [facility];  Service: Oral Surgery;  Laterality: N/A;     Allergies  Allergen Reactions  . Nitroglycerin Other (See Comments)    Hypotension, syncope, bradycardia      Family History  Problem Relation Age of Onset  . Arthritis Mother   . Asthma Mother   . Heart disease Maternal Uncle   . Heart disease Maternal Grandfather   . Alcohol abuse Maternal Grandfather   . Heart attack Brother 74  . Diabetes Brother      Social History [place] reports that he quit smoking about 12 years ago. His smoking use included cigarettes. He started smoking about 29 years ago. He has a 18.00 pack-year smoking history. He has never used smokeless tobacco. [place] reports no history of alcohol use.   Review of Systems CONSTITUTIONAL: No weight loss, fever, chills, weakness or fatigue.  HEENT: Eyes: No visual  loss, blurred vision, double vision or yellow sclerae.No hearing loss, sneezing, congestion, runny nose or sore throat.  SKIN: No rash or itching.  CARDIOVASCULAR: per hpi RESPIRATORY: No shortness of breath, cough or sputum.  GASTROINTESTINAL: No anorexia, nausea, vomiting or diarrhea. No abdominal pain or blood.  GENITOURINARY: No burning on urination, no polyuria NEUROLOGICAL: No headache, dizziness, syncope, paralysis, ataxia, numbness or tingling in the extremities. No change in bowel or bladder control.  MUSCULOSKELETAL: No muscle, back pain, joint pain or stiffness.  LYMPHATICS: No enlarged nodes. No history of splenectomy.  PSYCHIATRIC: No history of depression or anxiety.  ENDOCRINOLOGIC: No  reports of sweating, cold or heat intolerance. No polyuria or polydipsia.  Marland Kitchen   Physical Examination Today's Vitals   10/31/19 1419  BP: 113/77  Pulse: 90  SpO2: 95%  Weight: (!) 338 lb 9.6 oz (153.6 kg)  Height: '5\' 11"'$  (1.803 m)   Body mass index is 47.23 kg/m.  Gen: resting comfortably, no acute distress HEENT: no scleral icterus, pupils equal round and reactive, no palptable cervical adenopathy,  CV: RRR, no /mr/g no jvd Resp: Clear to auscultation bilaterally GI: abdomen is soft, non-tender, non-distended, normal bowel sounds, no hepatosplenomegaly MSK: extremities are warm, no edema.  Skin: warm, no rash Neuro:  no focal deficits Psych: appropriate affect   Diagnostic Studies  2016 Cath ANGIOGRAPHY:  Left main: Moderate size vessel which trifurcated into an LAD and intermediate vessel and left circumflex coronary artery.  LAD: The LAD was subtotally occluded at its ostium and there was diffuse 95-99% ostial proximal stenosis and then was totally occluded in the region of the first diagonal Moriah Loughry. There was a gap and then faint filling of a second diagonal Camran Keady which had a stent and there was faint filling of the LAD beyond the diagonal vessel via  collaterals.  [place] Intermediate: Small caliber vessel free of significant disease.  Left circumflex: Large caliber vessel that gave rise to 2 major marginal branches and then in the posterior lateral like coronary artery. The first marginal Legrand Lasser was moderate size and had proximal 90% followed by 80% stenoses. A distal superior Nickayla Mcinnis had 95% stenosis.  Right coronary artery: Moderate size vessel that had 95% mid stenosis and 80% distal stenosis in the region of the acute margin. The vessel supplied the PDA. There were septal collaterals to the LAD.   Left ventriculography revealed dilated ventricle with an ejection fraction of 25% with diffuse global hypokinesis   IMPRESSION:  Severe ischemic dilated cardiomyopathy with an ejection fraction approximately 25%.  Severe multivessel coronary obstructive disease with diffusely diseased subtotal occlusion of the ostial and proximal LAD with faint collaterals to the first and second diagonal vessel and more distal LAD; left circumflex coronary artery with tandem 90 and 80% obtuse marginal 1 stenosis with distal 95% stenosis in this distal superior Gerilynn Mccullars of this marginal vessel; and 95% mid RCA stenosis with 80% stenosis in the region of the acute margin with evidence for septal collaterals from the PDA to the LAD.  RECOMMENDATION:  The patient has surgical anatomy and CABG revascularization surgery will be recommended with surgical consultation in the morning. The patient was started on Aggrastat post procedure to reduce likelihood of progressive thrombosis. An attempt was made to initiate low-dose IV nitroglycerin, but the patient transiently dropped his blood pressure and this was discontinued.  10/2014 TEE Study Conclusions  - Left ventricle: Septal apical and anterior wall hypokinesis The cavity size was severely dilated. Systolic function was severely reduced. The estimated ejection fraction was in the range of  25% to 30%. - Aortic valve: There was trivial regurgitation. - Left atrium: The atrium was mildly dilated. - Atrial septum: No defect or patent foramen ovale was identified. - Pericardium, extracardiac: Small pericardial effusion IVC flat no evidence of tamponade.    02/2015 Echo Study Conclusions  - Left ventricle: The cavity size was mildly dilated. There was mild concentric hypertrophy. Systolic function was moderately to severely reduced. The estimated ejection fraction was in the range of 30% to 35%. Contrast enhancement utilized. Doppler parameters are consistent with abnormal left ventricular relaxation (grade 1 diastolic  dysfunction). Doppler parameters are consistent with high ventricular filling pressure. - Regional wall motion abnormality: Akinesis of the mid anterior, basal-mid anteroseptal, apical septal, and apical myocardium; severe hypokinesis of the mid inferoseptal myocardium; moderate hypokinesis of the apical lateral myocardium; mild hypokinesis of the mid anterolateral myocardium. - Aortic valve: Mildly to moderately calcified annulus. - Aorta: Mild to moderate aortic root dilatation. Aortic root dimension: 44 mm (ED). - Mitral valve: Mildly calcified annulus. Mildly thickened leaflets . - Left atrium: The atrium was mildly dilated. Volume/bsa, ES, (1-plane Simpson&'s, A2C): 33.2 ml/m^2. - Right ventricle: Systolic function was mildly reduced.  5/3/1/7 Clinic EKG (performed and reviewed in clinic) Sinus tach  08/2019 echo IMPRESSIONS    1. Left ventricular ejection fraction, by visual estimation, is 35 to 40%. The left ventricle has moderately decreased function. There is no left ventricular hypertrophy.  2. LV endocardium poorly visualized making LVEF estimation problematic. Would recommend a repeat limited study with contrast for a more accurate EF assessment.  3. Global right ventricle has mildly reduced systolic  function.The right ventricular size is normal. Right vetricular wall thickness was not assessed.  4. Left atrial size was normal.  5. Right atrial size was normal.  6. Mild aortic valve annular calcification.  7. Mild mitral annular calcification.  8. The mitral valve is degenerative. No evidence of mitral valve regurgitation.  9. The tricuspid valve is not well visualized. Tricuspid valve regurgitation is trivial. 10. The aortic valve was not well visualized. Aortic valve regurgitation is not visualized. No evidence of aortic valve sclerosis or stenosis. 11. The pulmonic valve was grossly normal. Pulmonic valve regurgitation is not visualized. 12. Aortic dilatation noted. 13. There is mild dilatation of the aortic root measuring 39 mm. 14. The inferior vena cava is normal in size with greater than 50% respiratory variability, suggesting right atrial pressure of 3 mmHg.  09/2019 echo IMPRESSIONS    1. Left ventricular ejection fraction, by visual estimation, is 35 to 40%. The left ventricle has moderately decreased function. There is no left ventricular hypertrophy.  2. The anteroseptal wall is akinetic. The apex and anterior wall are hypokinetic.  3. Global right ventricle was not assessed.The right ventricular size is not assessed. Right vetricular wall thickness was not assessed.  4. Left atrial size was not assessed.  5. Right atrial size was not assessed.  6. The pericardium was not assessed.  7. The mitral valve was not assessed. not assessed mitral valve regurgitation.  8. The tricuspid valve is not assessed. Tricuspid valve regurgitation not assessed.  9. Aortic valve regurgitation not assessed. 10. The aortic valve was not assessed. Aortic valve regurgitation not assessed. 11. The pulmonic valve was not assessed. Pulmonic valve regurgitation not assessed. 12. Not assessed. 13. The interatrial septum was not assessed.  Assessment and Plan   1. CAD/ Chronic systolic  HF/ICM - - management has been limited due to poor medication compliance. Delene Loll was too expesnsive, back on ARB. He has refused ICD, based on most recent echo above cut off - continue current meds, encouraged increased compliance. NO significant symptoms.    2. HTN - at goal, continue current mds   3. Hyperlipidemia - LDL above goal, this is due to poor compliance as opposed to insufficient dosing - continue statin, labs followed by pcp      Arnoldo Lenis, M.D.

## 2019-11-06 ENCOUNTER — Encounter: Payer: Self-pay | Admitting: Family Medicine

## 2019-11-06 ENCOUNTER — Ambulatory Visit (INDEPENDENT_AMBULATORY_CARE_PROVIDER_SITE_OTHER): Payer: Medicare Other | Admitting: Family Medicine

## 2019-11-06 DIAGNOSIS — H9202 Otalgia, left ear: Secondary | ICD-10-CM

## 2019-11-06 MED ORDER — AMOXICILLIN 500 MG PO CAPS: 1000.0000 mg | ORAL_CAPSULE | Freq: Three times a day (TID) | ORAL | 0 refills | Status: AC

## 2019-11-06 NOTE — Progress Notes (Signed)
Virtual Visit via Telephone Note  I connected with [place] on 11/06/19 at 10:26 AM by telephone and verified that I am speaking with the correct person using two identifiers. [place] is currently located at home and nobody is currently with him during this visit. The provider, Loman Brooklyn, FNP is located in their office at time of visit.  I discussed the limitations, risks, security and privacy concerns of performing an evaluation and management service by telephone and the availability of in person appointments. I also discussed with the patient that there may be a patient responsible charge related to this service. The patient expressed understanding and agreed to proceed.  Subjective: PCP: Sharion Balloon, FNP  Chief Complaint  Patient presents with  . URI   Patient complains of left ear pain and painful lymph node on left side under chin.  Onset of symptoms was 3 days ago, gradually worsening since that time. He is drinking plenty of fluids. Evaluation to date: none. Treatment to date: drinking cranberry juice and peroxide in the ear.   ROS: Per HPI  Current Outpatient Medications:  .  allopurinol (ZYLOPRIM) 100 MG tablet, Take 1 tablet (100 mg total) by mouth daily., Disp: 90 tablet, Rfl: 0 .  amLODipine (NORVASC) 10 MG tablet, Take 1 tablet (10 mg total) by mouth daily., Disp: 90 tablet, Rfl: 3 .  aspirin 81 MG tablet, Take 81 mg by mouth daily., Disp: , Rfl:  .  atorvastatin (LIPITOR) 80 MG tablet, TAKE 1 TABLET BY MOUTH ONCE DAILY AT 6PM. MUST KEEP JUNE APPT FOR FURTHER REFILLS., Disp: 90 tablet, Rfl: 1 .  blood glucose meter kit and supplies KIT, Dispense based on patient and insurance preference. Use up to four times daily as directed. (FOR ICD-10 E11.65), Disp: 1 each, Rfl: 2 .  colchicine 0.6 MG tablet, TAKE 1 TABLET BY MOUTH ONCE DAILY. (Patient taking differently: 0.6 mg as needed. ), Disp: 20 tablet, Rfl: 0 .  GLOBAL EASE INJECT PEN NEEDLES 31G X 8 MM  MISC, , Disp: , Rfl:  .  glucose blood test strip, 1 each by Other route as needed. Use as instructed 2 x daily. E11.65 One Touch Verio, Disp: 100 each, Rfl: 5 .  Insulin Detemir (LEVEMIR FLEXTOUCH) 100 UNIT/ML Pen, Inject 50 Units into the skin daily at 10 pm. (Please make January appt), Disp: 15 mL, Rfl: 0 .  liraglutide (VICTOZA) 18 MG/3ML SOPN, Inject 0.2 mLs (1.2 mg total) into the skin daily., Disp: 6 mL, Rfl: 0 .  losartan (COZAAR) 100 MG tablet, Take 100 mg by mouth daily., Disp: , Rfl:  .  metFORMIN (GLUCOPHAGE) 1000 MG tablet, TAKE 1 TABLET BY MOUTH TWICE DAILY WITH FOOD., Disp: 180 tablet, Rfl: 0 .  metoprolol (TOPROL-XL) 200 MG 24 hr tablet, TAKE 1 TABLET BY MOUTH ONCE DAILY., Disp: 90 tablet, Rfl: 1 .  Misc Natural Products (BLACK CHERRY CONCENTRATE PO), Take 1 tablet by mouth daily as needed (gout flare-up). , Disp: , Rfl:  .  OVER THE COUNTER MEDICATION, Take 2 tablets by mouth daily. Osteobioflex , Disp: , Rfl:  .  spironolactone (ALDACTONE) 25 MG tablet, TAKE (1/2) TABLET BY MOUTH DAILY., Disp: 45 tablet, Rfl: 1 .  torsemide (DEMADEX) 20 MG tablet, TAKE 2 TABLETS BY MOUTH TWICE DAILY., Disp: 120 tablet, Rfl: 3 .  traMADol (ULTRAM) 50 MG tablet, TAKE 1-2 TABLETS BY MOUTH EVERY 12 HOURS AS NEEDED., Disp: 120 tablet, Rfl: 2  Allergies  Allergen Reactions  .  Nitroglycerin Other (See Comments)    Hypotension, syncope, bradycardia   Past Medical History:  Diagnosis Date  . Chronic systolic CHF (congestive heart failure) ([place])   . Coronary artery disease 01/2013   anterior MI with cath showing 95% pLAD, 90% diag, 30% OM1, 70% mRCA, 60% dRCA s/p PCI with DES to LAD and diag and EF 40%  . Diabetes mellitus type 2, uncontrolled, with complications ([place])   . DJD (degenerative joint disease)    knees  . Hypertension   . Ischemic dilated cardiomyopathy ([place])   . Morbid obesity ([place])   . 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 Stroke [facility]) 10/12/2014   Patient with several month h/o left sided visual field deficit and MRI of brain revealing:  1. Medial right occipital lobe subacute infarct. 2. At least 3 punctate areas of acute nonhemorrhagic infarct are present involving the left thalamus, high posterior right frontal lobe and high a medial posterior left frontal or parietal lobe. 3. Additional white matter changes are noted beyond the areas of  . Visual field defect 10/12/2014    Observations/Objective: A&O  No respiratory distress or wheezing audible over the phone Mood, judgement, and thought processes all WNL  Assessment and Plan: 1. Left ear pain - Treating empirically as otitis media.  Encouraged use of Tylenol or ibuprofen for pain.  Advised patient if he is not having a significant improvement or resolution of symptoms by Monday to please call us back and let us know.  He is concerned about having an appointment with his PCP on the 11th of next month with his current symptoms. - amoxicillin (AMOXIL) 500 MG capsule; Take 2 capsules (1,000 mg total) by mouth every 8 (eight) hours for 7 days.  Dispense: 42 capsule; Refill: 0   Follow Up Instructions:  I discussed the assessment and treatment plan with the patient. The patient was provided an opportunity to ask questions and all were answered. The patient agreed with the plan and demonstrated an understanding of the instructions.   The patient was advised to call back or seek an in-person evaluation if the symptoms worsen or if the condition fails to improve as anticipated.  The above assessment and management plan was discussed with the patient. The patient verbalized understanding of and has agreed to the management plan. Patient is aware to call the clinic if symptoms persist or worsen. Patient is aware when to return to the clinic for a follow-up visit. Patient educated on when it is appropriate to go to the emergency department.   Time call ended: 10:34  AM  I provided 11 minutes of non-face-to-face time during this encounter.  Hendricks Limes, MSN, APRN, FNP-C [facility] 11/06/19

## 2019-11-20 ENCOUNTER — Other Ambulatory Visit: Payer: Self-pay

## 2019-11-21 ENCOUNTER — Ambulatory Visit (INDEPENDENT_AMBULATORY_CARE_PROVIDER_SITE_OTHER): Payer: Medicare Other | Admitting: Family

## 2019-11-21 ENCOUNTER — Encounter: Payer: Self-pay | Admitting: Family

## 2019-11-21 VITALS — BP 141/82 | HR 75 | Temp 96.6°F | Ht 71.0 in | Wt 337.0 lb

## 2019-11-21 DIAGNOSIS — E1165 Type 2 diabetes mellitus with hyperglycemia: Secondary | ICD-10-CM

## 2019-11-21 DIAGNOSIS — M8949 Other hypertrophic osteoarthropathy, multiple sites: Secondary | ICD-10-CM

## 2019-11-21 DIAGNOSIS — M109 Gout, unspecified: Secondary | ICD-10-CM | POA: Diagnosis not present

## 2019-11-21 DIAGNOSIS — M150 Primary generalized (osteo)arthritis: Secondary | ICD-10-CM

## 2019-11-21 DIAGNOSIS — Z9119 Patient's noncompliance with other medical treatment and regimen: Secondary | ICD-10-CM

## 2019-11-21 DIAGNOSIS — I2510 Atherosclerotic heart disease of native coronary artery without angina pectoris: Secondary | ICD-10-CM | POA: Diagnosis not present

## 2019-11-21 DIAGNOSIS — E1159 Type 2 diabetes mellitus with other circulatory complications: Secondary | ICD-10-CM

## 2019-11-21 DIAGNOSIS — I10 Essential (primary) hypertension: Secondary | ICD-10-CM

## 2019-11-21 DIAGNOSIS — M7989 Other specified soft tissue disorders: Secondary | ICD-10-CM

## 2019-11-21 DIAGNOSIS — G4733 Obstructive sleep apnea (adult) (pediatric): Secondary | ICD-10-CM

## 2019-11-21 DIAGNOSIS — E118 Type 2 diabetes mellitus with unspecified complications: Secondary | ICD-10-CM | POA: Diagnosis not present

## 2019-11-21 DIAGNOSIS — E785 Hyperlipidemia, unspecified: Secondary | ICD-10-CM

## 2019-11-21 DIAGNOSIS — Z0289 Encounter for other administrative examinations: Secondary | ICD-10-CM

## 2019-11-21 DIAGNOSIS — M159 Polyosteoarthritis, unspecified: Secondary | ICD-10-CM

## 2019-11-21 DIAGNOSIS — E1169 Type 2 diabetes mellitus with other specified complication: Secondary | ICD-10-CM

## 2019-11-21 DIAGNOSIS — F331 Major depressive disorder, recurrent, moderate: Secondary | ICD-10-CM

## 2019-11-21 DIAGNOSIS — IMO0002 Reserved for concepts with insufficient information to code with codable children: Secondary | ICD-10-CM

## 2019-11-21 DIAGNOSIS — F1120 Opioid dependence, uncomplicated: Secondary | ICD-10-CM

## 2019-11-21 DIAGNOSIS — Z951 Presence of aortocoronary bypass graft: Secondary | ICD-10-CM

## 2019-11-21 DIAGNOSIS — Z91199 Patient's noncompliance with other medical treatment and regimen due to unspecified reason: Secondary | ICD-10-CM

## 2019-11-21 LAB — BAYER DCA HB A1C WAIVED: HB A1C (BAYER DCA - WAIVED): >14 % — ABNORMAL HIGH (ref <7.0)

## 2019-11-21 MED ORDER — SPIRONOLACTONE 25 MG PO TABS: ORAL_TABLET | ORAL | 3 refills | Status: DC

## 2019-11-21 MED ORDER — VICTOZA 18 MG/3ML ~~LOC~~ SOPN: 1.2000 mg | PEN_INJECTOR | Freq: Every day | SUBCUTANEOUS | 4 refills | Status: AC

## 2019-11-21 MED ORDER — LOSARTAN POTASSIUM 100 MG PO TABS: 100.0000 mg | ORAL_TABLET | Freq: Every day | ORAL | 3 refills | Status: AC

## 2019-11-21 MED ORDER — TRESIBA FLEXTOUCH 200 UNIT/ML ~~LOC~~ SOPN: 45.0000 [IU] | PEN_INJECTOR | Freq: Every day | SUBCUTANEOUS | 3 refills | Status: DC

## 2019-11-21 MED ORDER — TRAMADOL HCL 50 MG PO TABS: ORAL_TABLET | ORAL | 2 refills | Status: AC

## 2019-11-21 MED ORDER — ATORVASTATIN CALCIUM 80 MG PO TABS: ORAL_TABLET | ORAL | 3 refills | Status: DC

## 2019-11-21 MED ORDER — METFORMIN HCL 1000 MG PO TABS: 1000.0000 mg | ORAL_TABLET | Freq: Two times a day (BID) | ORAL | 3 refills | Status: DC

## 2019-11-21 MED ORDER — TORSEMIDE 20 MG PO TABS: 40.0000 mg | ORAL_TABLET | Freq: Two times a day (BID) | ORAL | 3 refills | Status: DC

## 2019-11-21 MED ORDER — AMLODIPINE BESYLATE 10 MG PO TABS: 10.0000 mg | ORAL_TABLET | Freq: Every day | ORAL | 3 refills | Status: DC

## 2019-11-21 MED ORDER — ALLOPURINOL 100 MG PO TABS: 100.0000 mg | ORAL_TABLET | Freq: Every day | ORAL | 1 refills | Status: DC

## 2019-11-21 MED ORDER — METOPROLOL SUCCINATE ER 200 MG PO TB24: 200.0000 mg | ORAL_TABLET | Freq: Every day | ORAL | 1 refills | Status: DC

## 2019-11-21 NOTE — Progress Notes (Addendum)
Subjective:    Patient ID: Mark Leach, male    DOB: [birth year], 49 y.o.   MRN: [id number]  Chief Complaint  Patient presents with  . Medical Management of Chronic Issues   Pt presents to  the office today for chronic follow up. Pt is followed by his Cardiologists every 3 month for CHF, HX of CABG, and HX of stroke and MI. Pt states he is suppose to weight himself daily, but he is noncompliant.  PT still has not followed up with Endo for uncontrolled DM2. States he missed his last appt and never rescheduled.Pt is noncompliant with his diet and medications.   He reports he has not taken insulin, Lipitor, or metoprolol over the last several months because of he could not afford them.  Diabetes He presents for his follow-up diabetic visit. He has type 2 diabetes mellitus. His disease course has been worsening. There are no hypoglycemic associated symptoms. Pertinent negatives for hypoglycemia include no headaches. Associated symptoms include foot paresthesias. Pertinent negatives for diabetes include no blurred vision. Symptoms are worsening. Diabetic complications include heart disease. Pertinent negatives for diabetic complications include no peripheral neuropathy. Risk factors for coronary artery disease include diabetes mellitus, dyslipidemia, male sex, hypertension, obesity and sedentary lifestyle. He is following a generally unhealthy diet. His overall blood glucose range is >200 mg/dl. (Does not check regularly) Eye exam is not current.  Hypertension This is a chronic problem. The current episode started more than 1 year ago. The problem has been waxing and waning since onset. The problem is uncontrolled. Associated symptoms include peripheral edema. Pertinent negatives include no blurred vision, headaches or malaise/fatigue. Risk factors for coronary artery disease include obesity, male gender, dyslipidemia and diabetes mellitus. The current treatment provides moderate improvement.  Hypertensive end-organ damage includes heart failure. Identifiable causes of hypertension include sleep apnea.  Hyperlipidemia This is a chronic problem. The current episode started more than 1 year ago. The problem is uncontrolled. Recent lipid tests were reviewed and are high. Exacerbating diseases include hypothyroidism. Current antihyperlipidemic treatment includes statins. The current treatment provides no improvement of lipids. Risk factors for coronary artery disease include dyslipidemia, diabetes mellitus, male sex, hypertension and a sedentary lifestyle.  Arthritis Presents for follow-up visit. He complains of pain and stiffness. The symptoms have been stable. Affected locations include the right knee and left knee (back). His pain is at a severity of 4/10.  Depression        This is a chronic problem.  The current episode started more than 1 year ago.   The onset quality is gradual.   The problem occurs intermittently.  The problem has been waxing and waning since onset.  Associated symptoms include helplessness, decreased interest and sad.  Associated symptoms include not irritable, no restlessness and no headaches.  Past treatments include nothing.  Past medical history includes hypothyroidism.   OSA Uses CPAP nightly. Stable.    Review of Systems  Constitutional: Negative for malaise/fatigue.  Eyes: Negative for blurred vision.  Musculoskeletal: Positive for arthritis and stiffness.  Neurological: Negative for headaches.  Psychiatric/Behavioral: Positive for depression.  All other systems reviewed and are negative.      Objective:   Physical Exam Vitals reviewed.  Constitutional:      General: He is not irritable.He is not in acute distress.    Appearance: He is well-developed. He is obese.  HENT:     Head: Normocephalic.     Right Ear: Tympanic membrane normal.  Left Ear: Tympanic membrane normal.  Eyes:     General:        Right eye: No discharge.        Left eye:  No discharge.     Pupils: Pupils are equal, round, and reactive to light.  Neck:     Thyroid: No thyromegaly.  Cardiovascular:     Rate and Rhythm: Normal rate and regular rhythm.     Heart sounds: Normal heart sounds. No murmur.  Pulmonary:     Effort: Pulmonary effort is normal. No respiratory distress.     Breath sounds: Normal breath sounds. No wheezing.  Abdominal:     General: Bowel sounds are normal. There is no distension.     Palpations: Abdomen is soft.     Tenderness: There is no abdominal tenderness.  Musculoskeletal:        General: No tenderness.     Cervical back: Normal range of motion and neck supple.     Right lower leg: Edema (3+) present.     Left lower leg: Edema (3+) present.     Comments: Using cane to walk  Skin:    General: Skin is warm and dry.     Findings: No erythema or rash.  Neurological:     Mental Status: He is alert and oriented to person, place, and time.     Cranial Nerves: No cranial nerve deficit.     Deep Tendon Reflexes: Reflexes are normal and symmetric.  Psychiatric:        Behavior: Behavior normal.        Thought Content: Thought content normal.        Judgment: Judgment normal.     BP (!) 149/87   Pulse (!) 107   Temp (!) 96.6 F (35.9 C) (Temporal)   Ht '5\' 11"'$  (1.803 m)   Wt (!) 337 lb (152.9 kg)   SpO2 95%   BMI 47.00 kg/m      Assessment & Plan:  Mark Leach comes in today with chief complaint of Medical Management of Chronic Issues   Diagnosis and orders addressed:  1. Diabetes mellitus type 2, uncontrolled, with complications (HCC) - Bayer DCA Hb A1c Waived - CBC with Differential/Platelet - CMP14+EGFR - Microalbumin / creatinine urine ratio  2. Arm swelling - CBC with Differential/Platelet - CMP14+EGFR  3. Acute gout of right wrist, unspecified cause - CBC with Differential/Platelet - CMP14+EGFR  4. Primary osteoarthritis involving multiple joints - traMADol (ULTRAM) 50 MG tablet; TAKE 1-2 TABLETS  BY MOUTH EVERY 12 HOURS AS NEEDED.  Dispense: 120 tablet; Refill: 2 - CBC with Differential/Platelet - CMP14+EGFR  5. Uncomplicated opioid dependence (HCC) - traMADol (ULTRAM) 50 MG tablet; TAKE 1-2 TABLETS BY MOUTH EVERY 12 HOURS AS NEEDED.  Dispense: 120 tablet; Refill: 2 - CBC with Differential/Platelet - CMP14+EGFR  6. Pain management contract signed - traMADol (ULTRAM) 50 MG tablet; TAKE 1-2 TABLETS BY MOUTH EVERY 12 HOURS AS NEEDED.  Dispense: 120 tablet; Refill: 2 - CBC with Differential/Platelet - CMP14+EGFR  7. Coronary artery disease involving native coronary artery of native heart without angina pectoris - CBC with Differential/Platelet - CMP14+EGFR  8. DM type 2 causing vascular disease ([place]) -Will change Lantus to [place] - metFORMIN (GLUCOPHAGE) 1000 MG tablet; Take 1 tablet (1,000 mg total) by mouth 2 (two) times daily with a meal.  Dispense: 180 tablet; Refill: 3 - Insulin Degludec (TRESIBA FLEXTOUCH) 200 UNIT/ML SOPN; Inject 46 Units into the skin at bedtime.  Dispense:  12 pen; Refill: 3 - CBC with Differential/Platelet - CMP14+EGFR - Microalbumin / creatinine urine ratio  9. Essential hypertension, benign - CBC with Differential/Platelet - CMP14+EGFR  10. Hypertension associated with diabetes ([place]) - CBC with Differential/Platelet - CMP14+EGFR  11. Obstructive sleep apnea - CBC with Differential/Platelet - CMP14+EGFR  12. Hyperlipidemia associated with type 2 diabetes mellitus ([place]) - CBC with Differential/Platelet - CMP14+EGFR - Lipid panel  13. Moderate episode of recurrent major depressive disorder (HCC) - CBC with Differential/Platelet - CMP14+EGFR  14. Morbid obesity ([place]) - CBC with Differential/Platelet - CMP14+EGFR  15. Noncompliance - CBC with Differential/Platelet - CMP14+EGFR  16. S/P CABG x 4 - CBC with Differential/Platelet - CMP14+EGFR  Forms completed and all prescriptions printed and will fax to [facility] Department to get  prescription assistance.  Labs pending Health Maintenance reviewed Diet and exercise encouraged  Follow up plan: 1 months    Evelina Dun, FNP

## 2019-11-21 NOTE — Patient Instructions (Signed)

## 2019-11-22 LAB — CMP14+EGFR
ALT: 29 IU/L (ref 0–44)
AST: 21 IU/L (ref 0–40)
Albumin/Globulin Ratio: 1.5 (ref 1.2–2.2)
Albumin: 3.8 g/dL — ABNORMAL LOW (ref 4.0–5.0)
Alkaline Phosphatase: 207 IU/L — ABNORMAL HIGH (ref 39–117)
BUN/Creatinine Ratio: 14 (ref 9–20)
BUN: 18 mg/dL (ref 6–24)
Bilirubin Total: 0.8 mg/dL (ref 0.0–1.2)
CO2: 21 mmol/L (ref 20–29)
Calcium: 9.2 mg/dL (ref 8.7–10.2)
Chloride: 89 mmol/L — ABNORMAL LOW (ref 96–106)
Creatinine, Ser: 1.29 mg/dL — ABNORMAL HIGH (ref 0.76–1.27)
GFR calc Af Amer: 76 mL/min/{1.73_m2} (ref >59)
GFR calc non Af Amer: 66 mL/min/{1.73_m2} (ref >59)
Globulin, Total: 2.6 g/dL (ref 1.5–4.5)
Glucose: 466 mg/dL (ref 65–99)
Potassium: 4.9 mmol/L (ref 3.5–5.2)
Sodium: 130 mmol/L — ABNORMAL LOW (ref 134–144)
Total Protein: 6.4 g/dL (ref 6.0–8.5)

## 2019-11-22 LAB — CBC WITH DIFFERENTIAL/PLATELET
Basophils Absolute: 0 10*3/uL (ref 0.0–0.2)
Basos: 0 %
EOS (ABSOLUTE): 0.2 10*3/uL (ref 0.0–0.4)
Eos: 3 %
Hematocrit: 49.6 % (ref 37.5–51.0)
Hemoglobin: 16.2 g/dL (ref 13.0–17.7)
Immature Grans (Abs): 0.1 10*3/uL (ref 0.0–0.1)
Immature Granulocytes: 1 %
Lymphocytes Absolute: 0.9 10*3/uL (ref 0.7–3.1)
Lymphs: 13 %
MCH: 28.8 pg (ref 26.6–33.0)
MCHC: 32.7 g/dL (ref 31.5–35.7)
MCV: 88 fL (ref 79–97)
Monocytes Absolute: 0.5 10*3/uL (ref 0.1–0.9)
Monocytes: 8 %
Neutrophils Absolute: 5.4 10*3/uL (ref 1.4–7.0)
Neutrophils: 75 %
Platelets: 201 10*3/uL (ref 150–450)
RBC: 5.63 x10E6/uL (ref 4.14–5.80)
RDW: 11.7 % (ref 11.6–15.4)
WBC: 7.1 10*3/uL (ref 3.4–10.8)

## 2019-11-22 LAB — LIPID PANEL
Chol/HDL Ratio: 7.8 ratio — ABNORMAL HIGH (ref 0.0–5.0)
Cholesterol, Total: 195 mg/dL (ref 100–199)
HDL: 25 mg/dL — ABNORMAL LOW (ref >39)
LDL Chol Calc (NIH): 129 mg/dL — ABNORMAL HIGH (ref 0–99)
Triglycerides: 230 mg/dL — ABNORMAL HIGH (ref 0–149)
VLDL Cholesterol Cal: 41 mg/dL — ABNORMAL HIGH (ref 5–40)

## 2019-11-22 LAB — MICROALBUMIN / CREATININE URINE RATIO
Creatinine, Urine: 121.2 mg/dL
Microalb/Creat Ratio: 714 mg/g creat — ABNORMAL HIGH (ref 0–29)
Microalbumin, Urine: 865.1 ug/mL

## 2019-11-25 ENCOUNTER — Other Ambulatory Visit: Payer: Self-pay | Admitting: Family

## 2019-12-27 ENCOUNTER — Other Ambulatory Visit: Payer: Self-pay | Admitting: *Deleted

## 2020-01-20 DIAGNOSIS — Z23 Encounter for immunization: Secondary | ICD-10-CM | POA: Diagnosis not present

## 2020-02-05 DIAGNOSIS — Z23 Encounter for immunization: Secondary | ICD-10-CM | POA: Diagnosis not present

## 2020-02-05 NOTE — Telephone Encounter (Signed)
Vaccines have been updated in chart please respond to other questions,

## 2020-03-05 ENCOUNTER — Encounter: Payer: Self-pay | Admitting: Cardiology

## 2020-03-05 ENCOUNTER — Other Ambulatory Visit: Payer: Self-pay

## 2020-03-05 ENCOUNTER — Ambulatory Visit (INDEPENDENT_AMBULATORY_CARE_PROVIDER_SITE_OTHER): Payer: Medicare Other | Admitting: Cardiology

## 2020-03-05 VITALS — BP 168/98 | HR 101 | Ht 70.0 in | Wt 339.0 lb

## 2020-03-05 DIAGNOSIS — I2510 Atherosclerotic heart disease of native coronary artery without angina pectoris: Secondary | ICD-10-CM

## 2020-03-05 DIAGNOSIS — I10 Essential (primary) hypertension: Secondary | ICD-10-CM | POA: Diagnosis not present

## 2020-03-05 DIAGNOSIS — E782 Mixed hyperlipidemia: Secondary | ICD-10-CM

## 2020-03-05 DIAGNOSIS — I5022 Chronic systolic (congestive) heart failure: Secondary | ICD-10-CM | POA: Diagnosis not present

## 2020-03-05 NOTE — Progress Notes (Signed)
Clinical Summary Mark Leach is a 49 y.o.male seen today for follow up of the following medical problems.   1. CAD/Chronic systolic HF/ICM - hx of prior anterior STEMI in April 2014 at [facility], received stent to LAD - admit Jan 2016 with MI, cath severe CAD as described below with LVEF by LV gram 25% referred for CABG - s/p CABG Jan 2016 (LIMA-LAD, SVG-diag, SVG-PDA,SVG-OM) - 02/2015 echo: LVEF 30-35% - seen by EP 02/2015 for ICD evaluation, patient requested to hold off at that time.Has refused ICD since     09/2019 echo LVEF 35-40% - - no recent SOB. No recent edema - mixed compliant with meds  - no SOB/DOE. Leg edema is up and down - mixed compliance with meds, misses a few days a week      2. OSA - Followed by pulmonary Dr Mark Leach - compliant with cpap    3. Hyperlipidemia 03/2019 TC 186 TG 249 HDL 29 LDL 107   11/2019 TC 195 TG 230 HDL 25 LDL 129 - mixed compliance with statin   4. HTN - mixed medication compliance  5. DM2 -  HgbA1c>14 consistently, poor compliance     SH: moving to [place], [place] with his wife and kids later this year. Moving end of June.  COVID vaccine x 2.   Past Medical History:  Diagnosis Date  . Chronic systolic CHF (congestive heart failure) ([place])   . Coronary artery disease 01/2013   anterior MI with cath showing 95% pLAD, 90% diag, 30% OM1, 70% mRCA, 60% dRCA s/p PCI with DES to LAD and diag and EF 40%  . Diabetes mellitus type 2, uncontrolled, with complications ([place])   . DJD (degenerative joint disease)    knees  . Hypertension   . Ischemic dilated cardiomyopathy ([place])   . Morbid obesity ([place])   . 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 Stroke Mark Leach) 10/12/2014   Patient with several month h/o left sided visual field deficit and MRI of brain revealing:  1. Medial right occipital lobe subacute infarct. 2. At least 3 punctate areas of acute  nonhemorrhagic infarct are present involving the left thalamus, high posterior right frontal lobe and high a medial posterior left frontal or parietal lobe. 3. Additional white matter changes are noted beyond the areas of  . Visual field defect 10/12/2014     Allergies  Allergen Reactions  . Nitroglycerin Other (See Comments)    Hypotension, syncope, bradycardia     Current Outpatient Medications  Medication Sig Dispense Refill  . allopurinol (ZYLOPRIM) 100 MG tablet Take 1 tablet (100 mg total) by mouth daily. 90 tablet 1  . amLODipine (NORVASC) 10 MG tablet Take 1 tablet (10 mg total) by mouth daily. 90 tablet 3  . aspirin 81 MG tablet Take 81 mg by mouth daily.    Marland Kitchen atorvastatin (LIPITOR) 80 MG tablet TAKE 1 TABLET BY MOUTH ONCE DAILY AT 6PM. MUST KEEP JUNE APPT FOR FURTHER REFILLS. 90 tablet 3  . blood glucose meter kit and supplies KIT Dispense based on patient and insurance preference. Use up to four times daily as directed. (FOR ICD-10 E11.65) 1 each 2  . colchicine 0.6 MG tablet TAKE 1 TABLET BY MOUTH ONCE DAILY. (Patient taking differently: 0.6 mg as needed. ) 20 tablet 0  . GLOBAL EASE INJECT PEN NEEDLES 31G X 8 MM MISC     .  glucose blood test strip 1 each by Other route as needed. Use as instructed 2 x daily. E11.65 One Touch Verio 100 each 5  . Insulin Degludec (TRESIBA FLEXTOUCH) 200 UNIT/ML SOPN Inject 46 Units into the skin at bedtime. 12 pen 3  . Insulin Detemir (LEVEMIR FLEXTOUCH) 100 UNIT/ML Pen Inject 50 Units into the skin daily at 10 pm. (Please make January appt) 15 mL 0  . liraglutide (VICTOZA) 18 MG/3ML SOPN Inject 0.2 mLs (1.2 mg total) into the skin daily. 9 mL 4  . losartan (COZAAR) 100 MG tablet Take 1 tablet (100 mg total) by mouth daily. 90 tablet 3  . metFORMIN (GLUCOPHAGE) 1000 MG tablet Take 1 tablet (1,000 mg total) by mouth 2 (two) times daily with a meal. 180 tablet 3  . metoprolol (TOPROL-XL) 200 MG 24 hr tablet Take 1 tablet (200 mg total) by mouth  daily. 90 tablet 1  . Misc Natural Products (BLACK CHERRY CONCENTRATE PO) Take 1 tablet by mouth daily as needed (gout flare-up).     Marland Kitchen OVER THE COUNTER MEDICATION Take 2 tablets by mouth daily. Osteobioflex     . spironolactone (ALDACTONE) 25 MG tablet TAKE (1/2) TABLET BY MOUTH DAILY. 45 tablet 3  . torsemide (DEMADEX) 20 MG tablet Take 2 tablets (40 mg total) by mouth 2 (two) times daily. 120 tablet 3  . traMADol (ULTRAM) 50 MG tablet TAKE 1-2 TABLETS BY MOUTH EVERY 12 HOURS AS NEEDED. 120 tablet 2   No current facility-administered medications for this visit.     Past Surgical History:  Procedure Laterality Date  . CARDIAC CATHETERIZATION  01/2013   [facility] in [place]  . CHOLECYSTECTOMY    . CORONARY ANGIOPLASTY  01/2013   PCI with stenting of LAD  . CORONARY ARTERY BYPASS GRAFT N/A 10/13/2014   Procedure: CORONARY ARTERY BYPASS GRAFTING (CABG), ON PUMP, TIMES FOUR, USING LEFT INTERNAL MAMMARY ARTERY, BILATERAL GREATER SAPHENOUS VEINS HARVESTED ENDOSCOPICALLY;  Surgeon: Rexene Alberts, MD;  Location: [facility];  Service: Open Heart Surgery;  Laterality: N/A;  . LEFT HEART CATH N/A 10/10/2014   Procedure: LEFT HEART CATH;  Surgeon: Troy Sine, MD;  Location: [facility] CATH LAB;  Service: Cardiovascular;  Laterality: N/A;  . MULTIPLE EXTRACTIONS WITH ALVEOLOPLASTY N/A 10/22/2014   Procedure: Extraction of tooth #'s 1,3,4,5,6,7,8,9,10,11,12,13,14,15,16,20,21,22,24, 25,26,27,28,29,30,32 with alveoloplasty.;  Surgeon: Lenn Cal, DDS;  Location: [facility];  Service: Oral Surgery;  Laterality: N/A;     Allergies  Allergen Reactions  . Nitroglycerin Other (See Comments)    Hypotension, syncope, bradycardia      Family History  Problem Relation Age of Onset  . Arthritis Mother   . Asthma Mother   . Heart disease Maternal Uncle   . Heart disease Maternal Grandfather   . Alcohol abuse Maternal Grandfather   . Heart attack Brother 41  . Diabetes Brother      Social History [name] reports that he quit smoking about 12 years ago. His smoking use included cigarettes. He started smoking about 30 years ago. He has a 18.00 pack-year smoking history. He has never used smokeless tobacco. Mr. Stalvey reports no history of alcohol use.   Review of Systems CONSTITUTIONAL: No weight loss, fever, chills, weakness or fatigue.  HEENT: Eyes: No visual loss, blurred vision, double vision or yellow sclerae.No hearing loss, sneezing, congestion, runny nose or sore throat.  SKIN: No rash or itching.  CARDIOVASCULAR: per hpi RESPIRATORY: No shortness of breath, cough or sputum.  GASTROINTESTINAL: No anorexia,  nausea, vomiting or diarrhea. No abdominal pain or blood.  GENITOURINARY: No burning on urination, no polyuria NEUROLOGICAL: No headache, dizziness, syncope, paralysis, ataxia, numbness or tingling in the extremities. No change in bowel or bladder control.  MUSCULOSKELETAL: No muscle, back pain, joint pain or stiffness.  LYMPHATICS: No enlarged nodes. No history of splenectomy.  PSYCHIATRIC: No history of depression or anxiety.  ENDOCRINOLOGIC: No reports of sweating, cold or heat intolerance. No polyuria or polydipsia.  Marland Kitchen   Physical Examination Today's Vitals   03/05/20 1456  BP: (!) 168/98  Pulse: (!) 101  SpO2: 98%  Weight: (!) 339 lb (153.8 kg)  Height: 5' 10"  (1.778 m)   Body mass index is 48.64 kg/m.  Gen: resting comfortably, no acute distress HEENT: no scleral icterus, pupils equal round and reactive, no palptable cervical adenopathy,  CV: RRR, no m/r/g, no jvd Resp: Clear to auscultation bilaterally GI: abdomen is soft, non-tender, non-distended, normal bowel sounds, no hepatosplenomegaly MSK: extremities are warm, 1+ bilateral LE edema Skin: warm, no rash Neuro:  no focal deficits Psych: appropriate affect   Diagnostic Studies  2016 Cath ANGIOGRAPHY:  Left main: Moderate size vessel which trifurcated into an LAD and intermediate vessel and  left circumflex coronary artery.  LAD: The LAD was subtotally occluded at its ostium and there was diffuse 95-99% ostial proximal stenosis and then was totally occluded in the region of the first diagonal Siobahn Worsley. There was a gap and then faint filling of a second diagonal Dermot Gremillion which had a stent and there was faint filling of the LAD beyond the diagonal vessel via collaterals.  [place] Intermediate: Small caliber vessel free of significant disease.  Left circumflex: Large caliber vessel that gave rise to 2 major marginal branches and then in the posterior lateral like coronary artery. The first marginal Maher Shon was moderate size and had proximal 90% followed by 80% stenoses. A distal superior Paije Goodhart had 95% stenosis.  Right coronary artery: Moderate size vessel that had 95% mid stenosis and 80% distal stenosis in the region of the acute margin. The vessel supplied the PDA. There were septal collaterals to the LAD.   Left ventriculography revealed dilated ventricle with an ejection fraction of 25% with diffuse global hypokinesis   IMPRESSION:  Severe ischemic dilated cardiomyopathy with an ejection fraction approximately 25%.  Severe multivessel coronary obstructive disease with diffusely diseased subtotal occlusion of the ostial and proximal LAD with faint collaterals to the first and second diagonal vessel and more distal LAD; left circumflex coronary artery with tandem 90 and 80% obtuse marginal 1 stenosis with distal 95% stenosis in this distal superior Nels Munn of this marginal vessel; and 95% mid RCA stenosis with 80% stenosis in the region of the acute margin with evidence for septal collaterals from the PDA to the LAD.  RECOMMENDATION:  The patient has surgical anatomy and CABG revascularization surgery will be recommended with surgical consultation in the morning. The patient was started on Aggrastat post procedure to reduce likelihood of progressive thrombosis. An  attempt was made to initiate low-dose IV nitroglycerin, but the patient transiently dropped his blood pressure and this was discontinued.  10/2014 TEE Study Conclusions  - Left ventricle: Septal apical and anterior wall hypokinesis The cavity size was severely dilated. Systolic function was severely reduced. The estimated ejection fraction was in the range of 25% to 30%. - Aortic valve: There was trivial regurgitation. - Left atrium: The atrium was mildly dilated. - Atrial septum: No defect or patent foramen ovale was identified. -  Pericardium, extracardiac: Small pericardial effusion IVC flat no evidence of tamponade.    02/2015 Echo Study Conclusions  - Left ventricle: The cavity size was mildly dilated. There was mild concentric hypertrophy. Systolic function was moderately to severely reduced. The estimated ejection fraction was in the range of 30% to 35%. Contrast enhancement utilized. Doppler parameters are consistent with abnormal left ventricular relaxation (grade 1 diastolic dysfunction). Doppler parameters are consistent with high ventricular filling pressure. - Regional wall motion abnormality: Akinesis of the mid anterior, basal-mid anteroseptal, apical septal, and apical myocardium; severe hypokinesis of the mid inferoseptal myocardium; moderate hypokinesis of the apical lateral myocardium; mild hypokinesis of the mid anterolateral myocardium. - Aortic valve: Mildly to moderately calcified annulus. - Aorta: Mild to moderate aortic root dilatation. Aortic root dimension: 44 mm (ED). - Mitral valve: Mildly calcified annulus. Mildly thickened leaflets . - Left atrium: The atrium was mildly dilated. Volume/bsa, ES, (1-plane Simpson&'s, A2C): 33.2 ml/m^2. - Right ventricle: Systolic function was mildly reduced.  5/3/1/7 Clinic EKG (performed and reviewed in clinic) Sinus tach  08/2019 echo IMPRESSIONS   1. Left  ventricular ejection fraction, by visual estimation, is 35 to 40%. The left ventricle has moderately decreased function. There is no left ventricular hypertrophy. 2. LV endocardium poorly visualized making LVEF estimation problematic. Would recommend a repeat limited study with contrast for a more accurate EF assessment. 3. Global right ventricle has mildly reduced systolic function.The right ventricular size is normal. Right vetricular wall thickness was not assessed. 4. Left atrial size was normal. 5. Right atrial size was normal. 6. Mild aortic valve annular calcification. 7. Mild mitral annular calcification. 8. The mitral valve is degenerative. No evidence of mitral valve regurgitation. 9. The tricuspid valve is not well visualized. Tricuspid valve regurgitation is trivial. 10. The aortic valve was not well visualized. Aortic valve regurgitation is not visualized. No evidence of aortic valve sclerosis or stenosis. 11. The pulmonic valve was grossly normal. Pulmonic valve regurgitation is not visualized. 12. Aortic dilatation noted. 13. There is mild dilatation of the aortic root measuring 39 mm. 14. The inferior vena cava is normal in size with greater than 50% respiratory variability, suggesting right atrial pressure of 3 mmHg.  09/2019 echo IMPRESSIONS   1. Left ventricular ejection fraction, by visual estimation, is 35 to 40%. The left ventricle has moderately decreased function. There is no left ventricular hypertrophy. 2. The anteroseptal wall is akinetic. The apex and anterior wall are hypokinetic. 3. Global right ventricle was not assessed.The right ventricular size is not assessed. Right vetricular wall thickness was not assessed. 4. Left atrial size was not assessed. 5. Right atrial size was not assessed. 6. The pericardium was not assessed. 7. The mitral valve was not assessed. not assessed mitral valve regurgitation. 8. The tricuspid valve is not assessed.  Tricuspid valve regurgitation not assessed. 9. Aortic valve regurgitation not assessed. 10. The aortic valve was not assessed. Aortic valve regurgitation not assessed. 11. The pulmonic valve was not assessed. Pulmonic valve regurgitation not assessed. 12. Not assessed. 13. The interatrial septum was not assessed.   Assessment and Plan   1. CAD/ Chronic systolic HF/ICM - - management has been limited due to poor medication compliance. Delene Loll was too expesnsive, back on ARB. He has refused ICD, based on most recent echo above cut off -compliance continues to be an ongoing issue, he is on an appropriate medication regimen. Continue current therapy  2. HTN - elevated but has not taken meds, encourated compliance.  3. Hyperlipidemia - LDL above goal, this is due to poor compliance as opposed to insufficient dosing - we will continue current statin  He is moving next month, we will see him just as needed, address any questions until he establishes with cardiology at new location.      Arnoldo Lenis, M.D.

## 2020-03-05 NOTE — Patient Instructions (Signed)
Your physician recommends that you schedule a follow-up appointment in: AS NEEDED WITH DR BRANCH  Your physician recommends that you continue on your current medications as directed. Please refer to the Current Medication list given to you today.  Thank you for choosing [facility]!!    

## 2020-03-31 ENCOUNTER — Telehealth: Payer: Self-pay | Admitting: Cardiology

## 2020-03-31 NOTE — Telephone Encounter (Signed)
Patient is wanting to know if he can take Calcium pills. Will this interfere with heart medications.

## 2020-04-01 NOTE — Telephone Encounter (Signed)
Patient informed. 

## 2020-04-01 NOTE — Telephone Encounter (Signed)
From cardiac standpoint no reason can't take calcium pills   Dominga Ferry MD

## 2020-04-14 ENCOUNTER — Other Ambulatory Visit: Payer: Self-pay | Admitting: Family

## 2020-04-16 ENCOUNTER — Ambulatory Visit: Payer: Medicare Other | Admitting: Internal Medicine

## 2020-04-24 DIAGNOSIS — R102 Pelvic and perineal pain: Secondary | ICD-10-CM | POA: Diagnosis not present

## 2020-04-29 ENCOUNTER — Ambulatory Visit (INDEPENDENT_AMBULATORY_CARE_PROVIDER_SITE_OTHER): Payer: Medicare Other | Admitting: *Deleted

## 2020-04-29 VITALS — BP 140/80 | Wt 339.0 lb

## 2020-04-29 DIAGNOSIS — Z0000 Encounter for general adult medical examination without abnormal findings: Secondary | ICD-10-CM

## 2020-04-29 NOTE — Progress Notes (Signed)
MEDICARE ANNUAL WELLNESS VISIT  04/29/2020  Telephone Visit Disclaimer This Medicare AWV was conducted by telephone due to national recommendations for restrictions regarding the COVID-19 Pandemic (e.g. social distancing).  I verified, using two identifiers, that I am speaking with Mark Leach or their authorized healthcare agent. I discussed the limitations, risks, security, and privacy concerns of performing an evaluation and management service by telephone and the potential availability of an in-person appointment in the future. The patient expressed understanding and agreed to proceed.   Subjective:  Mark Leach is a 49 y.o. male patient of Hawks, Theador Hawthorne, FNP who had a Medicare Annual Wellness Visit today via telephone. Mark Leach is Retired and Disabled and lives with their family. he has 2 step-children. he reports that he is socially active and does interact with friends/family regularly. he is not physically active and enjoys fishing.  Patient Care Team: Sharion Balloon, FNP as PCP - General (Nurse Practitioner) Arnoldo Lenis, MD as PCP - Cardiology (Cardiology) Shea Evans, Norva Riffle, LCSW as Social Worker (Licensed Clinical Social Worker) Annamaria Boots, Kasandra Knudsen, MD as Consulting Physician (Pulmonary Disease) Branch, Alphonse Guild, MD as Consulting Physician (Cardiology) Cassandria Anger, MD as Consulting Physician (Endocrinology)  Advanced Directives 04/29/2020 03/13/2019 04/06/2015 12/03/2014 10/11/2014  Does Patient Have a Medical Advance Directive? No No No No No  Would patient like information on creating a medical advance directive? No - Patient declined Yes (MAU/Ambulatory/Procedural Areas - Information given) No - patient declined information No - patient declined information No - patient declined information    Hospital Utilization Over the Past 12 Months: # of hospitalizations or ER visits: 0 # of surgeries: 0  Review of Systems    Patient reports that his overall  health is unchanged compared to last year.  General ROS: negative  Patient Reported Readings (BP, Pulse, CBG, Weight, etc) BP 140/80 Comment: avg reading  Wt (!) 339 lb (153.8 kg)   BMI 48.64 kg/m    Pain Assessment       Current Medications & Allergies (verified) Allergies as of 04/29/2020      Reactions   Nitroglycerin Other (See Comments)   Hypotension, syncope, bradycardia      Medication List       Accurate as of April 29, 2020  9:53 AM. If you have any questions, ask your nurse or doctor.        STOP taking these medications   Tresiba FlexTouch 200 UNIT/ML FlexTouch Pen Generic drug: insulin degludec     TAKE these medications   allopurinol 100 MG tablet Commonly known as: ZYLOPRIM TAKE 1 TABLET DAILY.   amLODipine 10 MG tablet Commonly known as: NORVASC Take 1 tablet (10 mg total) by mouth daily.   aspirin 81 MG tablet Take 81 mg by mouth daily.   atorvastatin 80 MG tablet Commonly known as: LIPITOR TAKE 1 TABLET BY MOUTH ONCE DAILY AT 6PM. MUST KEEP JUNE APPT FOR FURTHER REFILLS.   BLACK CHERRY CONCENTRATE PO Take 1 tablet by mouth daily as needed (gout flare-up).   blood glucose meter kit and supplies Kit Dispense based on patient and insurance preference. Use up to four times daily as directed. (FOR ICD-10 E11.65)   colchicine 0.6 MG tablet TAKE 1 TABLET BY MOUTH ONCE DAILY. What changed:   how to take this  when to take this  reasons to take this   Global Ease Inject Pen Needles 31G X 8 MM Misc Generic drug: Insulin Pen Needle  glucose blood test strip 1 each by Other route as needed. Use as instructed 2 x daily. E11.65 One Touch Verio   Levemir FlexTouch 100 UNIT/ML FlexPen Generic drug: insulin detemir Inject 50 Units into the skin daily at 10 pm. (Please make January appt)   losartan 100 MG tablet Commonly known as: COZAAR Take 1 tablet (100 mg total) by mouth daily.   metFORMIN 1000 MG tablet Commonly known as:  GLUCOPHAGE Take 1 tablet (1,000 mg total) by mouth 2 (two) times daily with a meal.   metoprolol 200 MG 24 hr tablet Commonly known as: TOPROL-XL Take 1 tablet (200 mg total) by mouth daily.   OVER THE COUNTER MEDICATION Take 2 tablets by mouth daily. Osteobioflex   spironolactone 25 MG tablet Commonly known as: ALDACTONE TAKE (1/2) TABLET BY MOUTH DAILY.   torsemide 20 MG tablet Commonly known as: DEMADEX Take 2 tablets (40 mg total) by mouth 2 (two) times daily.   traMADol 50 MG tablet Commonly known as: ULTRAM TAKE 1-2 TABLETS BY MOUTH EVERY 12 HOURS AS NEEDED.   Victoza 18 MG/3ML Sopn Generic drug: liraglutide Inject 0.2 mLs (1.2 mg total) into the skin daily. What changed: how much to take       History (reviewed): Past Medical History:  Diagnosis Date  . Chronic systolic CHF (congestive heart failure) ([place])   . Coronary artery disease 01/2013   anterior MI with cath showing 95% pLAD, 90% diag, 30% OM1, 70% mRCA, 60% dRCA s/p PCI with DES to LAD and diag and EF 40%  . Diabetes mellitus type 2, uncontrolled, with complications ([place])   . DJD (degenerative joint disease)    knees  . Hypertension   . Ischemic dilated cardiomyopathy ([place])   . Morbid obesity ([place])   . 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 Stroke [facility]) 10/12/2014   Patient with several month h/o left sided visual field deficit and MRI of brain revealing:  1. Medial right occipital lobe subacute infarct. 2. At least 3 punctate areas of acute nonhemorrhagic infarct are present involving the left thalamus, high posterior right frontal lobe and high a medial posterior left frontal or parietal lobe. 3. Additional white matter changes are noted beyond the areas of  . Visual field defect 10/12/2014   Past Surgical History:  Procedure Laterality Date  . CARDIAC CATHETERIZATION  01/2013   [facility] in [place]  . CHOLECYSTECTOMY    . CORONARY ANGIOPLASTY   01/2013   PCI with stenting of LAD  . CORONARY ARTERY BYPASS GRAFT N/A 10/13/2014   Procedure: CORONARY ARTERY BYPASS GRAFTING (CABG), ON PUMP, TIMES FOUR, USING LEFT INTERNAL MAMMARY ARTERY, BILATERAL GREATER SAPHENOUS VEINS HARVESTED ENDOSCOPICALLY;  Surgeon: Rexene Alberts, MD;  Location: [place];  Service: Open Heart Surgery;  Laterality: N/A;  . LEFT HEART CATH N/A 10/10/2014   Procedure: LEFT HEART CATH;  Surgeon: Troy Sine, MD;  Location[facility] C[facility] LAB;  Service: Cardiovascular;  Laterality: N/A;  . MULTIPLE EXTRACTIONS WITH ALVEOLOPLASTY N/A 10/22/2014   Procedure: Extraction of tooth #'s 1,3,4,5,6,7,8,9,10,11,12,13,14,15,16,20,21,22,24, 25,26,27,28,29,30,32 with alveoloplasty.;  Surgeon: Lenn Cal, DDS;  Location: [place];  Service: Oral Surgery;  Laterality: N/A;   Family History  Problem Relation Age of Onset  . Arthritis Mother   . Asthma Mother   . Heart disease Maternal Uncle   . Heart disease Maternal Grandfather   . Alcohol abuse Maternal  Grandfather   . Heart attack Brother 49  . Diabetes Brother    Social History   Socioeconomic History  . Marital status: Married    Spouse name: Not on file  . Number of children: 1  . Years of education: 70  . Highest education level: Some college, no degree  Occupational History  . Occupation: disabled  Tobacco Use  . Smoking status: Former Smoker    Packs/day: 1.00    Years: 18.00    Pack years: 18.00    Types: Cigarettes    Start date: 01/04/1990    Quit date: 10/12/2007    Years since quitting: 12.5  . Smokeless tobacco: Never Used  Vaping Use  . Vaping Use: Never used  Substance and Sexual Activity  . Alcohol use: No    Alcohol/week: 0.0 standard drinks  . Drug use: No  . Sexual activity: Yes  Other Topics Concern  . Not on file  Social History Narrative   Lives w/ wife, step daughter, step son, and daughter in Sports coach.  Disabled   Social Determinants of Health   Financial Resource Strain:   . Difficulty of  Paying Living Expenses:   Food Insecurity:   . Worried About Charity fundraiser in the Last Year:   . Arboriculturist in the Last Year:   Transportation Needs:   . Film/video editor (Medical):   Marland Kitchen Lack of Transportation (Non-Medical):   Physical Activity:   . Days of Exercise per Week:   . Minutes of Exercise per Session:   Stress:   . Feeling of Stress :   Social Connections:   . Frequency of Communication with Friends and Family:   . Frequency of Social Gatherings with Friends and Family:   . Attends Religious Services:   . Active Member of Clubs or Organizations:   . Attends Archivist Meetings:   Marland Kitchen Marital Status:     Activities of Daily Living In your present state of health, do you have any difficulty performing the following activities: 04/29/2020  Hearing? N  Vision? Y  Comment wearing rx glasses  Difficulty concentrating or making decisions? Y  Comment sometimes  Walking or climbing stairs? N  Dressing or bathing? Y  Comment bathing is hard  Doing errands, shopping? Y  Preparing Food and eating ? N  Using the Toilet? N  In the past six months, have you accidently leaked urine? N  Do you have problems with loss of bowel control? N  Managing your Medications? N  Managing your Finances? N  Housekeeping or managing your Housekeeping? N  Some recent data might be hidden    Patient Education/ Literacy    Exercise Current Exercise Habits: The patient does not participate in regular exercise at present, Exercise limited by: None identified  Diet Patient reports consuming 3 meals a day and 0 snack(s) a day Patient reports that his primary diet is: Regular Patient reports that she does have regular access to food.   Depression Screen PHQ 2/9 Scores 04/29/2020 11/21/2019 11/21/2019 03/14/2019 03/13/2019 12/04/2018 10/24/2018  PHQ - 2 Score 0 0 0 0 0 1 2  PHQ- 9 Score - 1 - - - - 4     Fall Risk Fall Risk  04/29/2020 11/21/2019 03/13/2019 02/05/2018 12/25/2017   Falls in the past year? 0 0 1 No No  Number falls in past yr: - - 0 - -  Injury with Fall? - - 0 - -  Risk  for fall due to : - - Impaired mobility;Impaired balance/gait - Impaired balance/gait;Impaired mobility  Follow up - - Education provided;Falls prevention discussed - -     Objective:  Mark Leach seemed alert and oriented and he participated appropriately during our telephone visit.  Blood Pressure Weight BMI  BP Readings from Last 3 Encounters:  04/29/20 140/80  03/05/20 (!) 168/98  11/21/19 (!) 141/82   Wt Readings from Last 3 Encounters:  04/29/20 (!) 339 lb (153.8 kg)  03/05/20 (!) 339 lb (153.8 kg)  11/21/19 (!) 337 lb (152.9 kg)   BMI Readings from Last 1 Encounters:  04/29/20 48.64 kg/m    *Unable to obtain current vital signs, weight, and BMI due to telephone visit type  Hearing/Vision  . Mark Leach did not seem to have difficulty with hearing/understanding during the telephone conversation . Reports that he has not had a formal eye exam by an eye care professional within the past year . Reports that he has not had a formal hearing evaluation within the past year *Unable to fully assess hearing and vision during telephone visit type  Cognitive Function: 6CIT Screen 04/29/2020 03/13/2019  What Year? 0 points 0 points  What month? 0 points 0 points  What time? 0 points 0 points  Count back from 20 0 points 0 points  Months in reverse 0 points 0 points  Repeat phrase 0 points 2 points  Total Score 0 2   (Normal:0-7, Significant for Dysfunction: >8)  Normal Cognitive Function Screening: Yes   Immunization & Health Maintenance Record Immunization History  Administered Date(s) Administered  . Influenza,inj,Quad PF,6+ Mos 08/05/2015, 09/12/2016, 07/14/2017  . Influenza-Unspecified 07/24/2018  . Moderna SARS-COVID-2 Vaccination 01/08/2020, 02/03/2020  . Pneumococcal Conjugate-13 08/07/2015  . Pneumococcal Polysaccharide-23 12/04/2018  . Tdap 02/22/2016     Health Maintenance  Topic Date Due  . OPHTHALMOLOGY EXAM  12/20/2019  . Hepatitis C Screening  04/29/2021 (Originally January 15, 1971)  . INFLUENZA VACCINE  05/10/2020  . HEMOGLOBIN A1C  05/20/2020  . FOOT EXAM  11/20/2020  . TETANUS/TDAP  02/21/2026  . PNEUMOCOCCAL POLYSACCHARIDE VACCINE AGE 69-64 HIGH RISK  Completed  . COVID-19 Vaccine  Completed  . HIV Screening  Completed       Assessment  This is a routine wellness examination for Mark Leach.  Health Maintenance: Due or Overdue Health Maintenance Due  Topic Date Due  . OPHTHALMOLOGY EXAM  12/20/2019    Mark Leach does not need a referral for Community Assistance: Care Management:   no Social Work:    no Prescription Assistance:  no Nutrition/Diabetes Education:  no   Plan:  Personalized Goals Goals Addressed            This Visit's Progress   . Patient Stated   On track    Work on controlling diabetes and gout by eating a low carbohydrate and low purine diet.    . Prevent falls        Personalized Health Maintenance & Screening Recommendations  eye exam only   Lung Cancer Screening Recommended: no (Low Dose CT Chest recommended if Age 12-80 years, 30 pack-year currently smoking OR have quit w/in past 15 years) Hepatitis C Screening recommended: no HIV Screening recommended: no  Advanced Directives: Written information was not prepared per patient's request.  Referrals & Orders No orders of the defined types were placed in this encounter.   Follow-up Plan . Follow-up with Sharion Balloon, FNP as planned . Patient plans to move his  records soon, He has moved [place] into [place] and is on a wait list for a new provider there. He is aware to request records, once he gets in.    I have personally reviewed and noted the following in the patient's chart:   . Medical and social history . Use of alcohol, tobacco or illicit drugs  . Current medications and supplements . Functional ability and  status . Nutritional status . Physical activity . Advanced directives . List of other physicians . Hospitalizations, surgeries, and ER visits in previous 12 months . Vitals . Screenings to include cognitive, depression, and falls . Referrals and appointments  In addition, I have reviewed and discussed with Mark Leach certain preventive protocols, quality metrics, and best practice recommendations. A written personalized care plan for preventive services as well as general preventive health recommendations is available and can be mailed to the patient at his request.      [place] Dec  04/29/2020

## 2020-05-14 ENCOUNTER — Other Ambulatory Visit: Payer: Self-pay | Admitting: *Deleted

## 2020-05-14 MED ORDER — AMLODIPINE BESYLATE 10 MG PO TABS: 10.0000 mg | ORAL_TABLET | Freq: Every day | ORAL | 3 refills | Status: AC

## 2020-05-14 MED ORDER — SPIRONOLACTONE 25 MG PO TABS: ORAL_TABLET | ORAL | 3 refills | Status: AC

## 2020-06-01 ENCOUNTER — Other Ambulatory Visit: Payer: Self-pay | Admitting: *Deleted

## 2020-06-01 ENCOUNTER — Other Ambulatory Visit: Payer: Self-pay | Admitting: Family Medicine

## 2020-06-01 DIAGNOSIS — E1159 Type 2 diabetes mellitus with other circulatory complications: Secondary | ICD-10-CM

## 2020-06-01 MED ORDER — METFORMIN HCL 1000 MG PO TABS: 1000.0000 mg | ORAL_TABLET | Freq: Two times a day (BID) | ORAL | 3 refills | Status: DC

## 2020-06-01 MED ORDER — ATORVASTATIN CALCIUM 80 MG PO TABS: ORAL_TABLET | ORAL | 3 refills | Status: AC

## 2020-06-01 MED ORDER — TORSEMIDE 20 MG PO TABS: 40.0000 mg | ORAL_TABLET | Freq: Two times a day (BID) | ORAL | 3 refills | Status: AC

## 2020-06-01 MED ORDER — METFORMIN HCL 1000 MG PO TABS: 1000.0000 mg | ORAL_TABLET | Freq: Two times a day (BID) | ORAL | 3 refills | Status: AC

## 2020-08-24 ENCOUNTER — Other Ambulatory Visit: Payer: Self-pay | Admitting: *Deleted

## 2020-08-24 MED ORDER — METOPROLOL SUCCINATE ER 200 MG PO TB24: 200.0000 mg | ORAL_TABLET | Freq: Every day | ORAL | 0 refills | Status: AC

## 2020-09-04 DIAGNOSIS — E559 Vitamin D deficiency, unspecified: Secondary | ICD-10-CM | POA: Diagnosis not present

## 2020-09-04 DIAGNOSIS — M1909 Primary osteoarthritis, other specified site: Secondary | ICD-10-CM | POA: Diagnosis not present

## 2020-09-04 DIAGNOSIS — E118 Type 2 diabetes mellitus with unspecified complications: Secondary | ICD-10-CM | POA: Diagnosis not present

## 2020-09-04 DIAGNOSIS — I5043 Acute on chronic combined systolic (congestive) and diastolic (congestive) heart failure: Secondary | ICD-10-CM | POA: Diagnosis not present

## 2020-09-04 DIAGNOSIS — E1169 Type 2 diabetes mellitus with other specified complication: Secondary | ICD-10-CM | POA: Diagnosis not present

## 2020-09-04 DIAGNOSIS — E1159 Type 2 diabetes mellitus with other circulatory complications: Secondary | ICD-10-CM | POA: Diagnosis not present

## 2020-09-04 DIAGNOSIS — E1165 Type 2 diabetes mellitus with hyperglycemia: Secondary | ICD-10-CM | POA: Diagnosis not present

## 2020-09-04 DIAGNOSIS — G4733 Obstructive sleep apnea (adult) (pediatric): Secondary | ICD-10-CM | POA: Diagnosis not present

## 2020-09-04 DIAGNOSIS — I10 Essential (primary) hypertension: Secondary | ICD-10-CM | POA: Diagnosis not present

## 2020-09-04 DIAGNOSIS — E785 Hyperlipidemia, unspecified: Secondary | ICD-10-CM | POA: Diagnosis not present

## 2020-09-04 DIAGNOSIS — I2510 Atherosclerotic heart disease of native coronary artery without angina pectoris: Secondary | ICD-10-CM | POA: Diagnosis not present

## 2020-09-04 DIAGNOSIS — I2583 Coronary atherosclerosis due to lipid rich plaque: Secondary | ICD-10-CM | POA: Diagnosis not present

## 2021-08-22 ENCOUNTER — Other Ambulatory Visit: Payer: Self-pay | Admitting: Cardiology

## 2021-10-10 DEATH — deceased

## 2023-02-27 NOTE — Telephone Encounter (Signed)
Erroneous encounter will close.
